# Patient Record
Sex: Male | Born: 1935 | Race: White | Hispanic: No | Marital: Married | Smoking: Never smoker
Health system: Southern US, Community
[De-identification: ages and names within clinical notes are randomized; demographics above are authoritative.]

## PROBLEM LIST (undated history)

## (undated) DIAGNOSIS — E785 Hyperlipidemia, unspecified: Secondary | ICD-10-CM

## (undated) DIAGNOSIS — W19XXXA Unspecified fall, initial encounter: Secondary | ICD-10-CM

## (undated) DIAGNOSIS — K219 Gastro-esophageal reflux disease without esophagitis: Secondary | ICD-10-CM

## (undated) DIAGNOSIS — I10 Essential (primary) hypertension: Secondary | ICD-10-CM

## (undated) DIAGNOSIS — M1990 Unspecified osteoarthritis, unspecified site: Secondary | ICD-10-CM

## (undated) DIAGNOSIS — I340 Nonrheumatic mitral (valve) insufficiency: Secondary | ICD-10-CM

## (undated) DIAGNOSIS — R011 Cardiac murmur, unspecified: Secondary | ICD-10-CM

## (undated) DIAGNOSIS — I6523 Occlusion and stenosis of bilateral carotid arteries: Secondary | ICD-10-CM

## (undated) DIAGNOSIS — I4891 Unspecified atrial fibrillation: Secondary | ICD-10-CM

## (undated) DIAGNOSIS — N400 Enlarged prostate without lower urinary tract symptoms: Secondary | ICD-10-CM

## (undated) DIAGNOSIS — I499 Cardiac arrhythmia, unspecified: Secondary | ICD-10-CM

## (undated) DIAGNOSIS — I493 Ventricular premature depolarization: Secondary | ICD-10-CM

## (undated) HISTORY — PX: ROTATOR CUFF REPAIR: SHX139

## (undated) HISTORY — DX: Unspecified osteoarthritis, unspecified site: M19.90

## (undated) HISTORY — PX: EYE SURGERY: SHX253

## (undated) HISTORY — PX: CARDIOVERSION: SHX1299

## (undated) HISTORY — DX: Gastro-esophageal reflux disease without esophagitis: K21.9

## (undated) HISTORY — PX: LATERAL FUSION LUMBAR SPINE: SUR631

## (undated) HISTORY — PX: SHOULDER OPEN ROTATOR CUFF REPAIR: SHX2407

## (undated) HISTORY — PX: HERNIA REPAIR: SHX51

## (undated) HISTORY — PX: TONSILLECTOMY: SUR1361

## (undated) HISTORY — PX: KYPHOPLASTY: SHX5884

## (undated) HISTORY — PX: SHOULDER DEBRIDEMENT: SHX1052

## (undated) HISTORY — PX: OTHER SURGICAL HISTORY: SHX169

## (undated) HISTORY — PX: CRYOABLATION: SHX1415

## (undated) HISTORY — PX: TOTAL HIP REVISION: SHX763

## (undated) HISTORY — PX: INGUINAL HERNIA REPAIR: SUR1180

## (undated) HISTORY — PX: TOTAL HIP ARTHROPLASTY: SHX124

## (undated) HISTORY — PX: KIDNEY STONE SURGERY: SHX686

---

## 1986-06-17 HISTORY — PX: SHOULDER SURGERY: SHX246

## 1998-07-20 ENCOUNTER — Ambulatory Visit (HOSPITAL_BASED_OUTPATIENT_CLINIC_OR_DEPARTMENT_OTHER): Admission: RE | Admit: 1998-07-20 | Discharge: 1998-07-20 | Payer: Self-pay | Admitting: Surgery

## 1999-03-31 ENCOUNTER — Ambulatory Visit (HOSPITAL_BASED_OUTPATIENT_CLINIC_OR_DEPARTMENT_OTHER): Admission: RE | Admit: 1999-03-31 | Discharge: 1999-03-31 | Payer: Self-pay | Admitting: Surgery

## 2001-06-17 HISTORY — PX: TOTAL HIP ARTHROPLASTY: SHX124

## 2005-06-17 HISTORY — PX: BACK SURGERY: SHX140

## 2007-07-20 ENCOUNTER — Encounter: Admission: RE | Admit: 2007-07-20 | Discharge: 2007-07-20 | Payer: Self-pay | Admitting: Neurological Surgery

## 2008-06-17 HISTORY — PX: TOTAL HIP ARTHROPLASTY: SHX124

## 2010-10-09 ENCOUNTER — Other Ambulatory Visit: Payer: Self-pay | Admitting: Orthopaedic Surgery

## 2010-10-09 DIAGNOSIS — M549 Dorsalgia, unspecified: Secondary | ICD-10-CM

## 2010-10-16 ENCOUNTER — Ambulatory Visit
Admission: RE | Admit: 2010-10-16 | Discharge: 2010-10-16 | Disposition: A | Discharge status: Home / Self Care | Payer: Medicare Other | Source: Ambulatory Visit | Attending: Orthopaedic Surgery | Admitting: Orthopaedic Surgery

## 2010-10-16 DIAGNOSIS — M549 Dorsalgia, unspecified: Secondary | ICD-10-CM

## 2011-07-26 HISTORY — PX: DACROCYSTORHINOSTOMY: SHX1435

## 2012-04-23 ENCOUNTER — Other Ambulatory Visit: Payer: Self-pay | Admitting: Orthopaedic Surgery

## 2012-04-23 DIAGNOSIS — M5136 Other intervertebral disc degeneration, lumbar region: Secondary | ICD-10-CM

## 2012-04-30 ENCOUNTER — Ambulatory Visit
Admission: RE | Admit: 2012-04-30 | Discharge: 2012-04-30 | Disposition: A | Discharge status: Home / Self Care | Payer: Medicare Other | Source: Ambulatory Visit | Attending: Orthopaedic Surgery | Admitting: Orthopaedic Surgery

## 2012-04-30 VITALS — BP 154/67 | HR 54 | Ht 69.0 in | Wt 148.0 lb

## 2012-04-30 DIAGNOSIS — M5136 Other intervertebral disc degeneration, lumbar region: Secondary | ICD-10-CM

## 2012-04-30 MED ORDER — DIAZEPAM 5 MG PO TABS: 5.0000 mg | ORAL_TABLET | Freq: Once | ORAL | Status: DC

## 2012-04-30 MED ORDER — IOHEXOL 180 MG/ML  SOLN
15.0000 mL | Freq: Once | INTRAMUSCULAR | Status: AC | PRN
  Administered 2012-04-30: 15 mL via INTRATHECAL

## 2012-12-02 ENCOUNTER — Ambulatory Visit (INDEPENDENT_AMBULATORY_CARE_PROVIDER_SITE_OTHER): Payer: Medicare PPO | Admitting: Surgery

## 2012-12-02 ENCOUNTER — Encounter (INDEPENDENT_AMBULATORY_CARE_PROVIDER_SITE_OTHER): Payer: Self-pay | Admitting: Surgery

## 2012-12-02 VITALS — BP 138/64 | HR 56 | Temp 98.2°F | Resp 16 | Ht 69.0 in | Wt 150.2 lb

## 2012-12-02 DIAGNOSIS — R1031 Right lower quadrant pain: Secondary | ICD-10-CM | POA: Insufficient documentation

## 2012-12-02 MED ORDER — NAPROXEN 500 MG PO TABS: 500.0000 mg | ORAL_TABLET | Freq: Two times a day (BID) | ORAL | Status: AC

## 2012-12-02 NOTE — Patient Instructions (Signed)
Nonsteroidal Anti-Inflammatory Medications  Nonsteroidal anti-inflammatory medications (NSAIDs) are a group of medicines often used for relief of pain and inflammation. These drugs include ibuprofen, aspirin, and naproxen. They are widely available in an over-the-counter form. The mechanism by which these drugs work in the body is not clearly understood. NSAIDs have many effects on the body, including pain relief, anti-inflammation, fever reduction, and reducing the blood's ability to clot. Most NSAIDs are taken orally in tablet form. Some may also be taken by injection in a vein (intravenously). WHY ATHLETES USE IT Many athletes use NSAIDs for their anti-inflammatory and pain reducing (analgesic) properties. Athletic participation frequently causes aches, pains, and inflammation, which these drugs can treat. There is also some evidence that they speed recovery after injury.  ADVERSE EFFECTS   Nausea.  Stomach pain.  Bleeding from the stomach and intestines.  Inflammation of the kidneys (nephritis).  Inflammation of the liver (hepatitis).  Headache.  Ringing in the ears (tinnitus).  Rash, with sun exposure (photosensitivity).  Increase in fluid volume (fluid retention).  Ulcers of the stomach and small intestine.  Kidney failure.  Liver failure.  Poor control of asthma.  Itching (urticaria).  Increase in nasal polyps (swelling).  Depression.  Loss of red blood cells (anemia).  Loose stools (diarrhea). PHARMACOLOGY  NSAIDs exist in both short-acting and long-acting forms. Many users of NSAIDs experience pain relief with initial doses, that becomes less effective with continual use. Most caregivers believe NSAIDs should be used for 2 to 3 weeks, before they are considered ineffective. Most NSAIDs are excreted from the body through the kidneys. The use of NSAIDs under conditions where dehydration can occur increases the risk of side effects to the kidneys and the liver. This is  especially common in older athletes and in athletes not acclimated to the heat. All these drugs are well absorbed when taken by mouth. The price of these drugs is variable. PREVENTION  It is recommend that NSAIDs be used after athletic participation to help recovery, or in the early stages of injury treatment. This is the time when athletes are trying to control pain and inflammation. Many athletes choose to take NSAIDs prophylactically (preventative, before injury). However, this may be associated with an increased risk of side effects. If you experience any side effects, including a decrease in performance while taking NSAIDs, discontinue use and consult your caregiver. If you choose to use NSAIDs regularly. for longer than 3 to 6 months, you should obtain screening blood tests for the liver, kidney, and bone marrow. Ongoing use may also increase your risk of a stomach ulcer. Document Released: 06/03/2005 Document Revised: 08/26/2011 Document Reviewed: 09/15/2008 Exit[employer] Patient Information 2014 [employer].  

## 2012-12-02 NOTE — Progress Notes (Signed)
[facility]  Chief Complaint  Patient presents with  . New Evaluation    eval poss incisional hernia - patient is self-referred    HISTORY: Patient is a 78 year old male for whom I performed bilateral inguinal herniorrhaphy in February of 2000. Approximately 3 weeks ago he was doing some strenuous yard work and experienced onset of discomfort in the right lower portion of the abdominal wall. Patient was concerned about possible hernia recurrence. He scheduled this evaluation.  Patient has not seen a definite bulge. Area on the lower right abdominal wall causes him some discomfort with physical activity and walking. He has noted no change in his bowel habits.  Past Medical History  Diagnosis Date  . Arthritis   . GERD (gastroesophageal reflux disease)      Current Outpatient Prescriptions  Medication Sig Dispense Refill  . CADUET 5-20 MG per tablet       . CIALIS 5 MG tablet       . Eszopiclone 3 MG TABS       . losartan (COZAAR) 50 MG tablet       . LUMIGAN 0.01 % SOLN       . NEXIUM 40 MG capsule       . timolol (TIMOPTIC) 0.5 % ophthalmic solution       . naproxen (NAPROSYN) 500 MG tablet Take 1 tablet (500 mg total) by mouth 2 (two) times daily with a meal.  28 tablet  1   No current facility-administered medications for this visit.     No Known Allergies   History reviewed. No pertinent family history.   History   Social History  . Marital Status: Married    Spouse Name: N/A    Number of Children: N/A  . Years of Education: N/A   Social History Main Topics  . Smoking status: Never Smoker   . Smokeless tobacco: Never Used  . Alcohol Use: Yes  . Drug Use: No  . Sexually Active: None   Other Topics Concern  . None   Social History Narrative  . None     REVIEW OF SYSTEMS - PERTINENT POSITIVES ONLY: No signs or symptoms of obstruction. No visible bulge. Discomfort is more pronounced with physical  activity.  EXAM: Filed Vitals:   12/02/12 1123  BP: 138/64  Pulse: 56  Temp: 98.2 F (36.8 C)  Resp: 16    HEENT: normocephalic; pupils equal and reactive; sclerae clear; dentition good; mucous membranes moist NECK:  symmetric on extension; no palpable anterior or posterior cervical lymphadenopathy; no supraclavicular masses; no tenderness CHEST: clear to auscultation bilaterally without rales, rhonchi, or wheezes CARDIAC: regular rate and rhythm without significant murmur; peripheral pulses are full ABDOMEN: soft without distension; bowel sounds present; no mass; no hepatosplenomegaly; no hernia; Incisions are well-healed; mild tenderness in the right lower mid abdominal wall; patient is examined with cough and Valsalva both in a recumbent and a standing position EXT:  non-tender without edema; no deformity NEURO: no gross focal deficits; no sign of tremor   LABORATORY RESULTS: See [facility] (CHL-Epic) for most recent results   RADIOLOGY RESULTS: See [facility] (CHL-Epic) for most recent results   IMPRESSION: Pain in the right lower quadrant abdominal wall following vigorous physical activity, suspect muscle strain, no evidence of recurrent hernia  PLAN: After lengthy discussion, the patient is going to start on Naprosyn 500 mg twice daily for 2 weeks. I have given him one refill. I have asked him  to return in 3 weeks for repeat physical examination. Patient understands and agrees. If there is no improvement in his symptoms, we will consider CT scan of the abdomen.  Velora Heckler, MD, FACS General & Endocrine Surgery [facility]   Visit Diagnoses: 1. Abdominal wall pain in right lower quadrant     Primary Care Physician: Elijio Miles, MD

## 2012-12-28 ENCOUNTER — Encounter (INDEPENDENT_AMBULATORY_CARE_PROVIDER_SITE_OTHER): Payer: Medicare PPO | Admitting: Surgery

## 2013-06-17 HISTORY — PX: CATARACT EXTRACTION, BILATERAL: SHX1313

## 2017-10-15 HISTORY — PX: OTHER SURGICAL HISTORY: SHX169

## 2017-12-10 HISTORY — PX: OTHER SURGICAL HISTORY: SHX169

## 2017-12-15 ENCOUNTER — Other Ambulatory Visit: Payer: Self-pay

## 2017-12-15 ENCOUNTER — Inpatient Hospital Stay (HOSPITAL_COMMUNITY)
Admission: EM | Admit: 2017-12-15 | Discharge: 2017-12-18 | DRG: 560 | Disposition: A | Discharge status: Home / Self Care | Payer: Medicare Other | Attending: Internal Medicine | Admitting: Internal Medicine

## 2017-12-15 ENCOUNTER — Emergency Department (HOSPITAL_COMMUNITY): Payer: Medicare Other

## 2017-12-15 ENCOUNTER — Encounter (HOSPITAL_COMMUNITY): Payer: Self-pay | Admitting: Internal Medicine

## 2017-12-15 DIAGNOSIS — I10 Essential (primary) hypertension: Secondary | ICD-10-CM | POA: Diagnosis present

## 2017-12-15 DIAGNOSIS — S7011XA Contusion of right thigh, initial encounter: Secondary | ICD-10-CM | POA: Diagnosis present

## 2017-12-15 DIAGNOSIS — I739 Peripheral vascular disease, unspecified: Secondary | ICD-10-CM | POA: Diagnosis present

## 2017-12-15 DIAGNOSIS — E7800 Pure hypercholesterolemia, unspecified: Secondary | ICD-10-CM | POA: Diagnosis present

## 2017-12-15 DIAGNOSIS — Z79899 Other long term (current) drug therapy: Secondary | ICD-10-CM

## 2017-12-15 DIAGNOSIS — R509 Fever, unspecified: Secondary | ICD-10-CM | POA: Diagnosis not present

## 2017-12-15 DIAGNOSIS — R6510 Systemic inflammatory response syndrome (SIRS) of non-infectious origin without acute organ dysfunction: Secondary | ICD-10-CM

## 2017-12-15 DIAGNOSIS — D649 Anemia, unspecified: Secondary | ICD-10-CM | POA: Diagnosis present

## 2017-12-15 DIAGNOSIS — Z7901 Long term (current) use of anticoagulants: Secondary | ICD-10-CM | POA: Diagnosis not present

## 2017-12-15 DIAGNOSIS — M25551 Pain in right hip: Secondary | ICD-10-CM | POA: Diagnosis not present

## 2017-12-15 DIAGNOSIS — I480 Paroxysmal atrial fibrillation: Secondary | ICD-10-CM | POA: Diagnosis present

## 2017-12-15 DIAGNOSIS — T8451XA Infection and inflammatory reaction due to internal right hip prosthesis, initial encounter: Secondary | ICD-10-CM | POA: Diagnosis not present

## 2017-12-15 DIAGNOSIS — K219 Gastro-esophageal reflux disease without esophagitis: Secondary | ICD-10-CM | POA: Diagnosis present

## 2017-12-15 DIAGNOSIS — L03115 Cellulitis of right lower limb: Secondary | ICD-10-CM | POA: Diagnosis present

## 2017-12-15 DIAGNOSIS — Z981 Arthrodesis status: Secondary | ICD-10-CM

## 2017-12-15 DIAGNOSIS — N400 Enlarged prostate without lower urinary tract symptoms: Secondary | ICD-10-CM | POA: Diagnosis present

## 2017-12-15 DIAGNOSIS — W19XXXA Unspecified fall, initial encounter: Secondary | ICD-10-CM | POA: Diagnosis present

## 2017-12-15 DIAGNOSIS — E876 Hypokalemia: Secondary | ICD-10-CM | POA: Diagnosis present

## 2017-12-15 HISTORY — DX: Essential (primary) hypertension: I10

## 2017-12-15 LAB — URINALYSIS, ROUTINE W REFLEX MICROSCOPIC
Bacteria, UA: NONE SEEN
Bilirubin Urine: NEGATIVE
Glucose, UA: NEGATIVE mg/dL
KETONES UR: NEGATIVE mg/dL
LEUKOCYTES UA: NEGATIVE
Nitrite: NEGATIVE
PROTEIN: NEGATIVE mg/dL
Specific Gravity, Urine: 1.009 (ref 1.005–1.030)
pH: 6 (ref 5.0–8.0)

## 2017-12-15 LAB — COMPREHENSIVE METABOLIC PANEL
ALBUMIN: 3.1 g/dL — AB (ref 3.5–5.0)
ALT: 18 U/L (ref 0–44)
ANION GAP: 10 (ref 5–15)
AST: 28 U/L (ref 15–41)
Alkaline Phosphatase: 54 U/L (ref 38–126)
BILIRUBIN TOTAL: 2.1 mg/dL — AB (ref 0.3–1.2)
BUN: 16 mg/dL (ref 8–23)
CHLORIDE: 104 mmol/L (ref 98–111)
CO2: 24 mmol/L (ref 22–32)
Calcium: 8.8 mg/dL — ABNORMAL LOW (ref 8.9–10.3)
Creatinine, Ser: 0.9 mg/dL (ref 0.61–1.24)
GFR calc Af Amer: >60 mL/min (ref >60)
GFR calc non Af Amer: >60 mL/min (ref >60)
Glucose, Bld: 158 mg/dL — ABNORMAL HIGH (ref 70–99)
POTASSIUM: 3.2 mmol/L — AB (ref 3.5–5.1)
Sodium: 138 mmol/L (ref 135–145)
TOTAL PROTEIN: 5.8 g/dL — AB (ref 6.5–8.1)

## 2017-12-15 LAB — CBC WITH DIFFERENTIAL/PLATELET
Basophils Absolute: 0 10*3/uL (ref 0.0–0.1)
Basophils Relative: 0 %
EOS PCT: 0 %
Eosinophils Absolute: 0 10*3/uL (ref 0.0–0.7)
HEMATOCRIT: 38.9 % — AB (ref 39.0–52.0)
Hemoglobin: 13 g/dL (ref 13.0–17.0)
Lymphocytes Relative: 3 %
Lymphs Abs: 0.3 10*3/uL — ABNORMAL LOW (ref 0.7–4.0)
MCH: 32.6 pg (ref 26.0–34.0)
MCHC: 33.4 g/dL (ref 30.0–36.0)
MCV: 97.5 fL (ref 78.0–100.0)
MONO ABS: 1.2 10*3/uL — AB (ref 0.1–1.0)
MONOS PCT: 11 %
NEUTROS PCT: 86 %
Neutro Abs: 9.5 10*3/uL — ABNORMAL HIGH (ref 1.7–7.7)
PLATELETS: 135 10*3/uL — AB (ref 150–400)
RBC: 3.99 MIL/uL — AB (ref 4.22–5.81)
RDW: 13 % (ref 11.5–15.5)
WBC: 11 10*3/uL — AB (ref 4.0–10.5)

## 2017-12-15 LAB — I-STAT CG4 LACTIC ACID, ED
Lactic Acid, Venous: 1.82 mmol/L (ref 0.5–1.9)
Lactic Acid, Venous: 1.43 mmol/L (ref 0.5–1.9)

## 2017-12-15 MED ORDER — TAMSULOSIN HCL 0.4 MG PO CAPS
0.4000 mg | ORAL_CAPSULE | Freq: Every day | ORAL | Status: DC
  Administered 2017-12-17: 0.4 mg via ORAL
  Filled 2017-12-15 (×2): qty 1

## 2017-12-15 MED ORDER — ACETAMINOPHEN 650 MG RE SUPP: 650.0000 mg | Freq: Four times a day (QID) | RECTAL | Status: DC | PRN

## 2017-12-15 MED ORDER — ZOLPIDEM TARTRATE 5 MG PO TABS
5.0000 mg | ORAL_TABLET | Freq: Once | ORAL | Status: AC
  Administered 2017-12-15: 5 mg via ORAL
  Filled 2017-12-15: qty 1

## 2017-12-15 MED ORDER — POLYSACCHARIDE IRON COMPLEX 150 MG PO CAPS
150.0000 mg | ORAL_CAPSULE | Freq: Every day | ORAL | Status: DC
  Administered 2017-12-16 – 2017-12-17 (×2): 150 mg via ORAL
  Filled 2017-12-15 (×3): qty 1

## 2017-12-15 MED ORDER — PANTOPRAZOLE SODIUM 40 MG PO TBEC
40.0000 mg | DELAYED_RELEASE_TABLET | Freq: Every day | ORAL | Status: DC
  Administered 2017-12-16 – 2017-12-17 (×2): 40 mg via ORAL
  Filled 2017-12-15 (×3): qty 1

## 2017-12-15 MED ORDER — FLECAINIDE ACETATE 50 MG PO TABS
50.0000 mg | ORAL_TABLET | Freq: Two times a day (BID) | ORAL | Status: DC
  Administered 2017-12-16 – 2017-12-18 (×5): 50 mg via ORAL
  Filled 2017-12-15 (×6): qty 1

## 2017-12-15 MED ORDER — POTASSIUM CHLORIDE CRYS ER 20 MEQ PO TBCR
40.0000 meq | EXTENDED_RELEASE_TABLET | Freq: Once | ORAL | Status: AC
  Administered 2017-12-15: 40 meq via ORAL
  Filled 2017-12-15: qty 2

## 2017-12-15 MED ORDER — SODIUM CHLORIDE 0.9 % IV BOLUS (SEPSIS)
250.0000 mL | Freq: Once | INTRAVENOUS | Status: AC
  Administered 2017-12-15: 250 mL via INTRAVENOUS

## 2017-12-15 MED ORDER — PIPERACILLIN-TAZOBACTAM 3.375 G IVPB
3.3750 g | Freq: Three times a day (TID) | INTRAVENOUS | Status: DC
  Administered 2017-12-15 – 2017-12-16 (×2): 3.375 g via INTRAVENOUS
  Filled 2017-12-15 (×4): qty 50

## 2017-12-15 MED ORDER — DORZOLAMIDE HCL-TIMOLOL MAL PF 22.3-6.8 MG/ML OP SOLN: 1.0000 [drp] | OPHTHALMIC | Status: DC

## 2017-12-15 MED ORDER — ATORVASTATIN CALCIUM 40 MG PO TABS
40.0000 mg | ORAL_TABLET | Freq: Every day | ORAL | Status: DC
Start: 2017-12-15 — End: 2017-12-18
  Administered 2017-12-16 – 2017-12-17 (×2): 40 mg via ORAL
  Filled 2017-12-15 (×3): qty 1

## 2017-12-15 MED ORDER — SODIUM CHLORIDE 0.9 % IV SOLN
1.0000 g | Freq: Once | INTRAVENOUS | Status: DC
  Filled 2017-12-15: qty 10

## 2017-12-15 MED ORDER — DORZOLAMIDE HCL-TIMOLOL MAL 2-0.5 % OP SOLN
1.0000 [drp] | Freq: Every morning | OPHTHALMIC | Status: DC
  Administered 2017-12-16 – 2017-12-18 (×3): 1 [drp] via OPHTHALMIC
  Filled 2017-12-15: qty 10

## 2017-12-15 MED ORDER — SODIUM CHLORIDE 0.9 % IV SOLN
INTRAVENOUS | Status: AC
  Administered 2017-12-15: 21:00:00 via INTRAVENOUS

## 2017-12-15 MED ORDER — OCUVITE-LUTEIN PO CAPS
1.0000 | ORAL_CAPSULE | Freq: Every day | ORAL | Status: DC
  Filled 2017-12-15: qty 1

## 2017-12-15 MED ORDER — SODIUM CHLORIDE 0.9 % IV BOLUS (SEPSIS)
1000.0000 mL | Freq: Once | INTRAVENOUS | Status: AC
  Administered 2017-12-15: 1000 mL via INTRAVENOUS

## 2017-12-15 MED ORDER — ONDANSETRON HCL 4 MG PO TABS: 4.0000 mg | ORAL_TABLET | Freq: Four times a day (QID) | ORAL | Status: DC | PRN

## 2017-12-15 MED ORDER — DOCUSATE SODIUM 100 MG PO CAPS
100.0000 mg | ORAL_CAPSULE | Freq: Two times a day (BID) | ORAL | Status: DC
  Administered 2017-12-16 – 2017-12-18 (×3): 100 mg via ORAL
  Filled 2017-12-15 (×4): qty 1

## 2017-12-15 MED ORDER — VANCOMYCIN HCL 500 MG IV SOLR
500.0000 mg | Freq: Two times a day (BID) | INTRAVENOUS | Status: DC
  Administered 2017-12-16 – 2017-12-18 (×5): 500 mg via INTRAVENOUS
  Filled 2017-12-15 (×6): qty 500

## 2017-12-15 MED ORDER — ACETAMINOPHEN 325 MG PO TABS
650.0000 mg | ORAL_TABLET | Freq: Four times a day (QID) | ORAL | Status: DC | PRN
  Administered 2017-12-16 (×2): 650 mg via ORAL
  Filled 2017-12-15 (×3): qty 2

## 2017-12-15 MED ORDER — VANCOMYCIN HCL IN DEXTROSE 1-5 GM/200ML-% IV SOLN
1000.0000 mg | Freq: Once | INTRAVENOUS | Status: AC
  Administered 2017-12-15: 1000 mg via INTRAVENOUS
  Filled 2017-12-15: qty 200

## 2017-12-15 MED ORDER — FINASTERIDE 5 MG PO TABS
5.0000 mg | ORAL_TABLET | Freq: Every day | ORAL | Status: DC
  Administered 2017-12-17: 5 mg via ORAL
  Filled 2017-12-15 (×3): qty 1

## 2017-12-15 MED ORDER — PROSIGHT PO TABS
1.0000 | ORAL_TABLET | Freq: Every day | ORAL | Status: DC
  Administered 2017-12-16 – 2017-12-18 (×3): 1 via ORAL
  Filled 2017-12-15 (×3): qty 1

## 2017-12-15 MED ORDER — ONDANSETRON HCL 4 MG/2ML IJ SOLN: 4.0000 mg | Freq: Four times a day (QID) | INTRAMUSCULAR | Status: DC | PRN

## 2017-12-15 NOTE — ED Notes (Signed)
Patient transported to X-ray 

## 2017-12-15 NOTE — ED Triage Notes (Addendum)
Pt here from home after falling due to right leg pain.  Per EMS, pt denies cervical tenderness and patient has metal fragments in his right leg due to his hip replacement "spitting off" pieces of metal into his tissue. Pt on eloquis BID. Area on right leg appears to be warm and red. Temp 102.7 tympanic for EMS. Given 1000mg  tylenol by EMS.

## 2017-12-15 NOTE — Progress Notes (Signed)
Pharmacy Antibiotic Note  Travis Wolfe is a 83 y.o. male admitted on 12/15/2017 with cellulitis.  Pharmacy has been consulted for vancomycin and Zosyn dosing.  SCr stable, CrCl ~760ml/min  Plan: Start Zosyn 3.375 gm IV q8h (4 hour infusion) Give vancomycin 1g IV x 1, then start vancomycin 500mg  IV Q12h Monitor clinical picture, renal function, VT prn F/U C&S, abx deescalation / LOT  Height: 5\' 8"  (172.7 cm) Weight: 148 lb (67.1 kg) IBW/kg (Calculated) : 68.4  Temp (24hrs), Avg:99.6 F (37.6 C), Min:99.6 F (37.6 C), Max:99.6 F (37.6 C)  Recent Labs  Lab 12/15/17 1519 12/15/17 1540 12/15/17 1813  WBC 11.0*  --   --   CREATININE 0.90  --   --   LATICACIDVEN  --  1.82 1.43    Estimated Creatinine Clearance: 62.1 mL/min (by C-G formula based on SCr of 0.9 mg/dL).    No Known Allergies   Thank you for allowing pharmacy to be a part of this patient's care.  Armandina StammerBATCHELDER,Travis Wolfe 12/15/2017 6:21 PM

## 2017-12-15 NOTE — H&P (Signed)
[facility] History and Physical  CARDIN NITSCHKE WUJ:[id number] DOB: 1934/12/10 DOA: 12/15/2017   PCP: Elijio Miles., MD  Specialists: Patient is followed by cardiologist in [facility] Dr. Rudolpho Sevin.  Followed by orthopedics at [facility] as well as in [place].  Chief Complaint: Pain and swelling of the right thigh  HPI: CUTBERTO WINFREE is a 83 y.o. 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  Apparently he developed shrapnel bits which causes fluid accumulation in his right thigh.  Most recently he had aspiration of this fluid collection at [facility] done by interventional radiology.  He denies being on antibiotics recently.  Over the last 2 days he has developed pain and swelling in the right thigh area with fever at home.  The area has been warm to touch and somewhat reddish in color.  He feels that the right leg gives away easily due to the pain and swelling.  He reports 2 falls as a result, but denies any injury to the head or to his right leg.  Apparently he stopped taking his Eliquis 2 days ago.  Had nausea few days ago but no vomiting.  Denies any chest pain.  No shortness of breath.  The pain in the right leg is about 6 out of 10 in intensity.  In the emergency department patient was noted to be febrile.  Actually found to have a fever of 102 by the EMS.  He had a elevated WBC.  Lactic acid level was normal.  Renal function was normal.  Chest x-ray did not show any concerning findings.  He was noted to have soft blood pressures with normal heart rate and has been asymptomatic.  EDP discussed with orthopedics (Dr. Charlann Boxer) who recommended MRI and they will see the patient in consult.  Concern is for cellulitis  involving his right thigh area.  Home Medications: This medication this appears to be incomplete based on care everywhere reports and patient reports.  Requested pharmacy to review it again. Prior to Admission medications   Medication Sig Start Date End Date Taking? Authorizing Provider  atorvastatin (LIPITOR) 40 MG tablet Take 40 mg by mouth daily. 12/10/17  Yes [provider]  COSOPT PF 22.3-6.8 MG/ML SOLN ophthalmic solution Place 1 drop into both eyes every morning. 10/22/17  Yes [provider]  ELIQUIS 5 MG TABS tablet Take 5 mg by mouth 2 (two) times daily. 10/27/17  Yes [provider]  finasteride (PROSCAR) 5 MG tablet Take 5 mg by mouth daily.   Yes [provider]  iron polysaccharides (NIFEREX) 150 MG capsule Take 150 mg by mouth daily.   Yes [provider]  LUMIGAN 0.01 % SOLN  10/22/12  Yes [provider]  LYCOPENE PO Take 1 tablet by mouth daily.   Yes [provider]  MAGNESIUM CARBONATE PO Take 1 tablet by mouth daily.   Yes [provider]  multivitamin-lutein (OCUVITE-LUTEIN) CAPS capsule Take 1 capsule by mouth daily.   Yes [provider]  NEXIUM 40 MG capsule Take 40 mg by mouth daily.  11/19/12  Yes [provider]  Zinc Acetate, Oral, (ZINC ACETATE PO) Take 1 tablet by mouth daily.   Yes [provider]    Allergies: No Known Allergies  Past Medical History: Past Medical History:  Diagnosis Date  . Arthritis   . GERD (gastroesophageal reflux disease)   . Hypertension     Past Surgical History:  Procedure Laterality Date  . HERNIA REPAIR Bilateral   . KYPHOPLASTY     t7  . LATERAL FUSION LUMBAR SPINE    . SHOULDER DEBRIDEMENT Right   . SHOULDER OPEN ROTATOR CUFF REPAIR Left   . TOTAL HIP ARTHROPLASTY     done twice    Social History: no h/o smoking.  Occasional alcohol use.  No illicit drug use.  Usually independent with daily activities.  He lives with his  wife.   Family History:  Family History  Problem Relation Age of Onset  . Hypertension Mother   . Cancer Mother      Review of Systems - History obtained from the patient and wife General ROS: positive for  - fatigue Psychological ROS: negative Ophthalmic ROS: negative ENT ROS: negative Allergy and Immunology ROS: negative Hematological and Lymphatic ROS: negative Endocrine ROS: negative Respiratory ROS: no cough, shortness of breath, or wheezing Cardiovascular ROS: no chest pain or dyspnea on exertion Gastrointestinal ROS: no abdominal pain, change in bowel habits, or black or bloody stools Genito-Urinary ROS: no dysuria, trouble voiding, or hematuria Musculoskeletal ROS: as in hpi Neurological ROS: no TIA or stroke symptoms Dermatological ROS: as in hpi  Physical Examination  Vitals:   12/15/17 1454 12/15/17 1630 12/15/17 1645 12/15/17 1700  BP: 102/65 (!) 102/49 (!) 110/51 (!) 107/48  Pulse: 88 71 68 68  Resp: 18 12 15 15   Temp: 99.6 F (37.6 C)     TempSrc: Oral     SpO2: 96% 99% 99% 99%  Weight: 67.1 kg (148 lb)     Height: 5\' 8"  (1.727 m)       BP (!) 107/48   Pulse 68   Temp 99.6 F (37.6 C) (Oral)   Resp 15   Ht 5\' 8"  (1.727 m)   Wt 67.1 kg (148 lb)   SpO2 99%   BMI 22.50 kg/m   General appearance: alert, cooperative, appears stated age and no distress Head: Normocephalic, without obvious abnormality, atraumatic Eyes: conjunctivae/corneas clear. PERRL, EOM's intact.  Throat: lips, mucosa, and tongue normal; teeth and gums normal Neck: no adenopathy, no carotid bruit, no JVD, supple, symmetrical, trachea midline and thyroid not enlarged, symmetric, no tenderness/mass/nodules Resp: clear to auscultation bilaterally Cardio: regular rate and rhythm, S1, S2 normal, no murmur, click, rub or gallop GI: soft, non-tender; bowel sounds normal; no masses,  no organomegaly Extremities: Swollen compared to the left.  Mostly in the lateral area.  There is mild  tenderness.  There is erythema in the lateral aspect.  Warm to touch. Pulses: 2+ and symmetric Good dorsalis pedis pulse in the right lower extremity Skin: Erythema and warmth over the right lateral thigh Lymph nodes: Cervical, supraclavicular, and axillary nodes normal. Neurologic: Alert and oriented x3.  No focal neurological deficits.   Labs on Admission: I have personally reviewed following labs and imaging studies  CBC: Recent Labs  Lab 12/15/17 1519  WBC 11.0*  NEUTROABS 9.5*  HGB 13.0  HCT 38.9*  MCV 97.5  PLT 135*   Basic Metabolic Panel: Recent Labs  Lab 12/15/17 1519  NA 138  K 3.2*  CL 104  CO2 24  GLUCOSE 158*  BUN 16  CREATININE 0.90  CALCIUM 8.8*   GFR: Estimated Creatinine Clearance: 62.1 mL/min (by C-G formula based on SCr of  0.9 mg/dL). Liver Function Tests: Recent Labs  Lab 12/15/17 1519  AST 28  ALT 18  ALKPHOS 54  BILITOT 2.1*  PROT 5.8*  ALBUMIN 3.1*    Radiological Exams on Admission: Dg Chest 2 View  Result Date: 12/15/2017 CLINICAL DATA:  Fever. EXAM: CHEST - 2 VIEW COMPARISON:  Chest radiographs and chest CTA 10/14/2017 FINDINGS: The cardiac silhouette is borderline enlarged. No airspace consolidation, edema, pleural effusion, or pneumothorax is identified. Old T7 and T8 compression fractures are again noted with prior augmentation of the former. IMPRESSION: No active cardiopulmonary disease. Electronically Signed   By: Sebastian Ache M.D.   On: 12/15/2017 16:24    My interpretation of Electrocardiogram: Sinus rhythm in the 80s.  Normal axis.  Incomplete LBBB.  Subtle ST changes in the lateral leads V5 V6.  No old EKG available for comparison.   Problem List  Principal Problem:   Cellulitis of right leg Active Problems:   PAF (paroxysmal atrial fibrillation) (HCC)   Assessment: Is a 83 year old Caucasian male with a past medical history as stated earlier who presents with pain and swelling in the right thigh area.  He has also had  2 falls in the last few days.  Concern is for cellulitis in the right thigh area.  He has had a complicated orthopedic history and has had good collection in that thigh which has been drained as recently as June 26.  This was done by interventional radiology in [facility].  Plan: #1 cellulitis of the right thigh area with sepsis: Patient will need hospitalization for IV antibiotics.  He will be given broad-spectrum coverage with vancomycin and Zosyn especially due to his complicated history with recent drainage procedure.  They aspirated about 600 mL of debris-filled bloody fluid on 6/26.  This was done by Dr. Archer Asa at [facility].  Patient does have some swelling in that leg.  Dr. Charlann Boxer with orthopedics has been consulted by EDP and he will see the patient.  MRI was recommended and has been ordered by the ED provider.  Patient's lactic acid level is normal.  Follow-up on blood cultures.  #2  Falls: Most likely due to acute illness.  Patient does not have any focal neurological deficits.  PT and OT evaluation can be ordered tomorrow depending on the results of the MRI and orthopedic plan.  #3  Paroxysmal atrial fibrillation: Followed by cardiology at [facility]  Based on the care everywhere notes he is on diltiazem, flecainide and apixaban.  Hold his anticoagulation for now.  Hold the diltiazem since his blood pressure is soft.  Continue just with flecainide for now.  #4  History of BPH: Continue with this Flomax and finasteride.  #5 hypokalemia: Repleted.  DVT Prophylaxis: Patient has been on anticoagulation with apixaban which will be held for now. Code Status: Full code Family Communication: Discussed with patient and his wife Consults called: Dr. Charlann Boxer with orthopedics has been consulted by EDP  Severity of Illness: The appropriate patient status for this patient is INPATIENT. Inpatient status is judged to be reasonable and necessary in order to provide the required intensity of service to  ensure the patient's safety. The patient's presenting symptoms, physical exam findings, and initial radiographic and laboratory data in the context of their chronic comorbidities is felt to place them at high risk for further clinical deterioration. Furthermore, it is not anticipated that the patient will be medically stable for discharge from the hospital within 2 midnights of admission. The following factors  support the patient status of inpatient.   " The patient's presenting symptoms include right thigh pain.. " The worrisome physical exam findings include erythema involving the right thigh. " The initial radiographic and laboratory data are worrisome because of elevated WBC.  MRI is pending.. " The chronic co-morbidities include paroxysmal atrial fibrillation.   * I certify that at the point of admission it is my clinical judgment that the patient will require inpatient hospital care spanning beyond 2 midnights from the point of admission due to high intensity of service, high risk for further deterioration and high frequency of surveillance required.*   Further management decisions will depend on results of further testing and patient's response to treatment.   Osvaldo Shipper  [facility] Pager [phone]  If 7PM-7AM, please contact night-coverage [email or web address] Password [facility]  12/15/2017, 6:03 PM

## 2017-12-15 NOTE — ED Provider Notes (Signed)
[facility] EMERGENCY DEPARTMENT Provider Note   CSN: [id number] Arrival date & time: 12/15/17  1449     History   Chief Complaint Chief Complaint  Patient presents with  . Leg Pain  . Fall    HPI Travis Wolfe is a 83 y.o. male.  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  Reports he has had shrapnel bits that came off of the placement.  He had 2 revisions, last being approximately 1 year ago.  He has continued to have ongoing problems with it.  He gets large swellings and fluid collections.  He had one that involve the anterior leg and wraparound to his buttock and posterior thigh.  Most recently he had a ultrasound-guided aspiration by radiology through [facility].  Note dated 12/15/2017 Dr. Malachy Moan.  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  Large complex fluid collection along posterior aspect of thigh tracking along hamstring musculature.  Note indicates transition from 14-gauge Angiocath to 8.5 [place] catheter to 12 French catheter to drain a thick, complex fluid collection of sand like red debris.  Total of 600 mL removed.  Patient reports that he has had chronic problems with pain and weakness in that leg due to the complications of the surgery.  He did fall today because the leg gave out.  He denies that he struck his head.(He did strike the right side of his face yesterday when he was getting off a barstool.  There was no fall associated.  Reports he bruised very easily due to the Eliquis).   Patient reports he stopped taking Eliquis for about the past 2 days.  He reports that he has been on Eliquis for atrial fibrillation but he has not had any episodes of atrial fibrillation in a long time and wonders whether he really needs it.  Patient was not aware that he had fever to 102.  Did start developing chills yesterday and has had some  malaise.  He does not localize other positive symptoms on review of systems.  No significant cough or shortness of breath or chest pain.  No nausea vomiting or abdominal pain.  No urinary pain burning or difficulty. Past Medical History:  Diagnosis Date  . Arthritis   . GERD (gastroesophageal reflux disease)   . Hypertension     Patient Active Problem List   Diagnosis Date Noted  . Cellulitis of right leg 12/15/2017  . PAF (paroxysmal atrial fibrillation) (HCC) 12/15/2017  . Abdominal wall pain in right lower quadrant 12/02/2012    Past Surgical History:  Procedure Laterality Date  . HERNIA REPAIR Bilateral   . KYPHOPLASTY     t7  . LATERAL FUSION LUMBAR SPINE    . SHOULDER DEBRIDEMENT Right   . SHOULDER OPEN ROTATOR CUFF REPAIR Left   . TOTAL HIP ARTHROPLASTY     done twice        Home Medications    Prior to Admission medications   Medication Sig Start Date End Date Taking? Authorizing Provider  atorvastatin (LIPITOR) 40 MG tablet Take 40 mg by mouth daily. 12/10/17  Yes [provider]  COSOPT PF 22.3-6.8 MG/ML SOLN ophthalmic solution Place 1 drop into both eyes every morning. 10/22/17  Yes [provider]  ELIQUIS 5 MG TABS tablet Take 5 mg by mouth 2 (two) times daily. 10/27/17  Yes [provider]  finasteride (PROSCAR) 5 MG tablet  Take 5 mg by mouth daily.   Yes [provider]  iron polysaccharides (NIFEREX) 150 MG capsule Take 150 mg by mouth daily.   Yes [provider]  LUMIGAN 0.01 % SOLN  10/22/12  Yes [provider]  LYCOPENE PO Take 1 tablet by mouth daily.   Yes [provider]  MAGNESIUM CARBONATE PO Take 1 tablet by mouth daily.   Yes [provider]  multivitamin-lutein (OCUVITE-LUTEIN) CAPS capsule Take 1 capsule by mouth daily.   Yes [provider]  NEXIUM 40 MG capsule Take 40 mg by mouth daily.  11/19/12  Yes [provider]  Zinc Acetate, Oral, (ZINC ACETATE PO)  Take 1 tablet by mouth daily.   Yes [provider]    Family History Family History  Problem Relation Age of Onset  . Hypertension Mother   . Cancer Mother     Social History Social History   Tobacco Use  . Smoking status: Never Smoker  . Smokeless tobacco: Never Used  Substance Use Topics  . Alcohol use: Yes  . Drug use: No     Allergies   Patient has no known allergies.   Review of Systems Review of Systems 10 Systems reviewed and are negative for acute change except as noted in the HPI.   Physical Exam Updated Vital Signs BP (!) 107/48   Pulse 68   Temp 99.6 F (37.6 C) (Oral)   Resp 15   Ht 5\' 8"  (1.727 m)   Wt 67.1 kg (148 lb)   SpO2 99%   BMI 22.50 kg/m   Physical Exam  Constitutional:  Patient is alert and nontoxic.  He is pleasant interactive.  No respiratory distress at rest.  HENT:  Patient has bruising on the right side of the face see attached images.  The oropharynx widely patent.  Eyes: Pupils are equal, round, and reactive to light. EOM are normal.  Neck: Neck supple.  Cardiovascular: Normal rate, regular rhythm, normal heart sounds and intact distal pulses.  Pulmonary/Chest: Effort normal and breath sounds normal.  Abdominal: Soft. He exhibits no distension. There is no tenderness. There is no guarding.  Musculoskeletal:  Patient has some letter range of motion of the right lower extremity due to his chronic hip pain.  Significant bruising on the left forearm.  Normal range of motion in both upper extremities.                     ED Treatments / Results  Labs (all labs ordered are listed, but only abnormal results are displayed) Labs Reviewed  COMPREHENSIVE METABOLIC PANEL - Abnormal; Notable for the following components:      Result Value   Potassium 3.2 (*)    Glucose, Bld 158 (*)    Calcium 8.8 (*)    Total Protein 5.8 (*)    Albumin 3.1 (*)    Total Bilirubin 2.1 (*)    All other components within  normal limits  CBC WITH DIFFERENTIAL/PLATELET - Abnormal; Notable for the following components:   WBC 11.0 (*)    RBC 3.99 (*)    HCT 38.9 (*)    Platelets 135 (*)    Neutro Abs 9.5 (*)    Lymphs Abs 0.3 (*)    Monocytes Absolute 1.2 (*)    All other components within normal limits  CULTURE, BLOOD (ROUTINE X 2)  CULTURE, BLOOD (ROUTINE X 2)  URINALYSIS, ROUTINE W REFLEX MICROSCOPIC  I-STAT CG4 LACTIC ACID, ED  I-STAT CG4 LACTIC ACID, ED    EKG EKG Interpretation  Date/Time:  Monday December 15 2017 15:52:18 EDT Ventricular Rate:  82 PR Interval:    QRS Duration: 110 QT Interval:  372 QTC Calculation: 435 R Axis:   -12 Text Interpretation:  Sinus rhythm Incomplete left bundle branch block agree. no old comparison Confirmed by Arby BarrettePfeiffer, Stevi Hollinshead [phone]([id number]) on 12/15/2017 6:20:45 PM   Radiology Dg Chest 2 View  Result Date: 12/15/2017 CLINICAL DATA:  Fever. EXAM: CHEST - 2 VIEW COMPARISON:  Chest radiographs and chest CTA 10/14/2017 FINDINGS: The cardiac silhouette is borderline enlarged. No airspace consolidation, edema, pleural effusion, or pneumothorax is identified. Old T7 and T8 compression fractures are again noted with prior augmentation of the former. IMPRESSION: No active cardiopulmonary disease. Electronically Signed   By: Sebastian AcheAllen  Grady M.D.   On: 12/15/2017 16:24    Procedures Procedures (including critical care time) CRITICAL CARE Performed by: Arby BarretteMarcy Robt Okuda   Total critical care time:30 minutes  Critical care time was exclusive of separately billable procedures and treating other patients.  Critical care was necessary to treat or prevent imminent or life-threatening deterioration.  Critical care was time spent personally by me on the following activities: development of treatment plan with patient and/or surrogate as well as nursing, discussions with consultants, evaluation of patient's response to treatment, examination of patient, obtaining history from patient or  surrogate, ordering and performing treatments and interventions, ordering and review of laboratory studies, ordering and review of radiographic studies, pulse oximetry and re-evaluation of patient's condition. Medications Ordered in ED Medications  sodium chloride 0.9 % bolus 1,000 mL (0 mLs Intravenous Stopped 12/15/17 1643)    And  sodium chloride 0.9 % bolus 1,000 mL (0 mLs Intravenous Stopped 12/15/17 1643)    And  sodium chloride 0.9 % bolus 250 mL (has no administration in time range)  vancomycin (VANCOCIN) IVPB 1000 mg/200 mL premix (1,000 mg Intravenous New Bag/Given 12/15/17 1820)  potassium chloride SA (K-DUR,KLOR-CON) CR tablet 40 mEq (has no administration in time range)  piperacillin-tazobactam (ZOSYN) IVPB 3.375 g (has no administration in time range)     Initial Impression / Assessment and Plan / ED Course  I have reviewed the triage vital signs and the nursing notes.  Pertinent labs & imaging results that were available during my care of the patient were reviewed by me and considered in my medical decision making (see chart for details).  Clinical Course as of Dec 16 1819  Mon Dec 15, 2017  1713 Consult: Reviewed with Dr. Constance Goltzlen [facility].  Suggest to initiate antibiotics.  Try to get MRI, with contrast if possible.  Will consult on the patient with hospitalist admission.   [MP]    Clinical Course User Index [MP] Arby BarrettePfeiffer, Cameren Odwyer, MD   consult: Tried hospitalist for admission.  Final Clinical Impressions(s) / ED Diagnoses   Final diagnoses:  Cellulitis of right lower extremity  Fever, unspecified fever cause  SIRS (systemic inflammatory response syndrome) (HCC)   Presents with malaise and weakness.  Fever of 102 identified by EMS.  Started with symptoms of chills yesterday.  Review of systems and physical exam, only positive finding is aspiration site of a recurrent pseudotumor of the soft tissue which is erythematous and suggestive of cellulitis.  Patient reports  there was no erythema prior to the procedure.  This time, treatment will be initiated for cellulitis.  Consultation has been placed to orthopedic surgery as outlined above.  Consultation to hospitalist for admission. ED Discharge  Orders    None       Arby Barrette, MD 12/15/17 [phone]

## 2017-12-16 ENCOUNTER — Inpatient Hospital Stay (HOSPITAL_COMMUNITY): Payer: Medicare Other

## 2017-12-16 DIAGNOSIS — D649 Anemia, unspecified: Secondary | ICD-10-CM

## 2017-12-16 LAB — CBC
HCT: 33.4 % — ABNORMAL LOW (ref 39.0–52.0)
HEMOGLOBIN: 11 g/dL — AB (ref 13.0–17.0)
MCH: 32.8 pg (ref 26.0–34.0)
MCHC: 32.9 g/dL (ref 30.0–36.0)
MCV: 99.7 fL (ref 78.0–100.0)
PLATELETS: 112 10*3/uL — AB (ref 150–400)
RBC: 3.35 MIL/uL — AB (ref 4.22–5.81)
RDW: 13.1 % (ref 11.5–15.5)
WBC: 8 10*3/uL (ref 4.0–10.5)

## 2017-12-16 LAB — COMPREHENSIVE METABOLIC PANEL
ALT: 15 U/L (ref 0–44)
ANION GAP: 6 (ref 5–15)
AST: 21 U/L (ref 15–41)
Albumin: 2.5 g/dL — ABNORMAL LOW (ref 3.5–5.0)
Alkaline Phosphatase: 43 U/L (ref 38–126)
BUN: 12 mg/dL (ref 8–23)
CHLORIDE: 109 mmol/L (ref 98–111)
CO2: 23 mmol/L (ref 22–32)
CREATININE: 0.79 mg/dL (ref 0.61–1.24)
Calcium: 8 mg/dL — ABNORMAL LOW (ref 8.9–10.3)
Glucose, Bld: 86 mg/dL (ref 70–99)
Potassium: 3.5 mmol/L (ref 3.5–5.1)
Sodium: 138 mmol/L (ref 135–145)
Total Bilirubin: 2.1 mg/dL — ABNORMAL HIGH (ref 0.3–1.2)
Total Protein: 4.9 g/dL — ABNORMAL LOW (ref 6.5–8.1)

## 2017-12-16 MED ORDER — GADOBENATE DIMEGLUMINE 529 MG/ML IV SOLN
15.0000 mL | Freq: Once | INTRAVENOUS | Status: AC | PRN
  Administered 2017-12-16: 14 mL via INTRAVENOUS

## 2017-12-16 MED ORDER — POTASSIUM CHLORIDE CRYS ER 20 MEQ PO TBCR
20.0000 meq | EXTENDED_RELEASE_TABLET | Freq: Once | ORAL | Status: AC
  Administered 2017-12-16: 20 meq via ORAL
  Filled 2017-12-16: qty 1

## 2017-12-16 MED ORDER — ZOLPIDEM TARTRATE 5 MG PO TABS
5.0000 mg | ORAL_TABLET | Freq: Every evening | ORAL | Status: DC | PRN
  Administered 2017-12-16 – 2017-12-17 (×2): 5 mg via ORAL
  Filled 2017-12-16 (×2): qty 1

## 2017-12-16 MED ORDER — PIPERACILLIN-TAZOBACTAM 3.375 G IVPB
3.3750 g | Freq: Three times a day (TID) | INTRAVENOUS | Status: DC
  Administered 2017-12-16 – 2017-12-18 (×6): 3.375 g via INTRAVENOUS
  Filled 2017-12-16 (×7): qty 50

## 2017-12-16 MED ORDER — LORAZEPAM 2 MG/ML IJ SOLN: 0.5000 mg | Freq: Once | INTRAMUSCULAR | Status: DC

## 2017-12-16 NOTE — Progress Notes (Addendum)
[facility] PROGRESS NOTE  MARGUERITE JARBOE WGN:[id number] DOB: 04/03/1935 DOA: 12/15/2017  PCP: Elijio Miles., MD  Brief History/Interval Summary: 83 year old Caucasian male with a past medical history of BPH, paroxysmal atrial fibrillation on anticoagulation with apixaban, on Flecainide, essential hypertension, peripheral vascular disease previous history of right hip replacement with revision surgery, with development of fluid collections requiring aspiration.  He underwent aspiration by IR at [facility] on June 26 from the right thigh.  Subsequently developed worsening pain swelling redness and fever.  Came into the hospital.  Was hospitalized with cellulitis.  MRI was ordered.  Orthopedics was consulted.  Reason for Visit: Cellulitis involving right thigh with fluid collection  Consultants: Orthopedics, Dr. Charlann Boxer  Procedures: None yet  Antibiotics: Currently on vancomycin and Zosyn  Subjective/Interval History: Patient denies any significant pain in the right leg.  Overall he feels slightly better compared to yesterday.  Asking for diet to be initiated.  Apparently seen by somebody from orthopedics this morning who told him that he will not need any surgery.  ROS: Denies any nausea or vomiting  Objective:  Vital Signs  Vitals:   12/15/17 1955 12/15/17 2241 12/16/17 0552 12/16/17 0816  BP: (!) 117/59 (!) 120/54 (!) 125/52 (!) 126/57  Pulse: 71 69 78 72  Resp: 18  16 18   Temp: 98 F (36.7 C)  98.2 F (36.8 C) 98.4 F (36.9 C)  TempSrc: Oral  Oral Oral  SpO2: 100%  96% 96%  Weight:      Height:        Intake/Output Summary (Last 24 hours) at 12/16/2017 1204 Last data filed at 12/16/2017 1000 Gross per 24 hour  Intake 927.45 ml  Output 800 ml  Net 127.45 ml   Filed Weights   12/15/17 1454  Weight: 67.1 kg (148 lb)    General appearance: alert, cooperative, appears stated age and no distress Head: Normocephalic, without obvious abnormality,  atraumatic Resp: clear to auscultation bilaterally Cardio: regular rate and rhythm, S1, S2 normal, no murmur, click, rub or gallop GI: soft, non-tender; bowel sounds normal; no masses,  no organomegaly Extremities: Continues to have swelling in the right thigh area laterally.  Erythema appears to have improved.  Mildly tender to palpation. Neurologic: Awake alert.  Oriented x3.  No focal neurological deficits.  Lab Results:  Data Reviewed: I have personally reviewed following labs and imaging studies  CBC: Recent Labs  Lab 12/15/17 1519 12/16/17 0628  WBC 11.0* 8.0  NEUTROABS 9.5*  --   HGB 13.0 11.0*  HCT 38.9* 33.4*  MCV 97.5 99.7  PLT 135* 112*    Basic Metabolic Panel: Recent Labs  Lab 12/15/17 1519 12/16/17 0628  NA 138 138  K 3.2* 3.5  CL 104 109  CO2 24 23  GLUCOSE 158* 86  BUN 16 12  CREATININE 0.90 0.79  CALCIUM 8.8* 8.0*    GFR: Estimated Creatinine Clearance: 69.9 mL/min (by C-G formula based on SCr of 0.79 mg/dL).  Liver Function Tests: Recent Labs  Lab 12/15/17 1519 12/16/17 0628  AST 28 21  ALT 18 15  ALKPHOS 54 43  BILITOT 2.1* 2.1*  PROT 5.8* 4.9*  ALBUMIN 3.1* 2.5*     Radiology Studies: Dg Chest 2 View  Result Date: 12/15/2017 CLINICAL DATA:  Fever. EXAM: CHEST - 2 VIEW COMPARISON:  Chest radiographs and chest CTA 10/14/2017 FINDINGS: The cardiac silhouette is borderline enlarged. No airspace consolidation, edema, pleural effusion, or pneumothorax is identified. Old T7 and T8 compression  fractures are again noted with prior augmentation of the former. IMPRESSION: No active cardiopulmonary disease. Electronically Signed   By: Sebastian Ache M.D.   On: 12/15/2017 16:24   Mr Femur Right W Wo Contrast  Result Date: 12/16/2017 CLINICAL DATA:  Right thigh pain and swelling. EXAM: MRI OF THE RIGHT FEMUR WITHOUT AND WITH CONTRAST TECHNIQUE: Multiplanar, multisequence MR imaging of the right thigh was performed both before and after administration  of intravenous contrast. CONTRAST:  14mL MULTIHANCE GADOBENATE DIMEGLUMINE 529 MG/ML IV SOLN COMPARISON:  MRI 01/09/2017 FINDINGS: Long stem redo right total hip arthroplasty appears stable. No findings for periprosthetic fracture or obvious changes of osteomyelitis. Stable large complex joint effusion and periarticular fluid most consistent with metallosis. Persistent layering inflammatory debris within the complex fluid. There is also a stable more solid appearing masslike component posteriorly involving the ischial tuberosity. There is a new large complex fluid collection extending down the posterior compartment of the thigh which demonstrates a fluid fluid level and rim-like enhancement. This has increased T1 signal intensity. It could be a hematoma or progression of the metal low cyst down into the thigh. No obvious gas is demonstrated to suggest an abscess. No surrounding inflammatory changes in the hamstring muscles. There is mild edema like signal abnormality in the superficial aspect of the vastus lateralis muscle. This fluid collection is 30 cm long and 7 cm wide. IMPRESSION: 1. 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 2. 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 3. New large fluid collection extending down the posterior compartment of the thigh down to just above the level of the knee. This could be a continuation of the metallosis process or more likely a large hematoma with fluid fluid level. Abscess cannot be totally excluded but appears less likely. Electronically Signed   By: Rudie Meyer M.D.   On: 12/16/2017 08:26     Medications:  Scheduled: . atorvastatin  40 mg Oral Daily  . docusate sodium  100 mg Oral BID  . dorzolamide-timolol  1 drop Both Eyes q morning - 10a  . finasteride  5 mg Oral Daily  . flecainide   50 mg Oral Q12H  . iron polysaccharides  150 mg Oral Daily  . LORazepam  0.5 mg Intravenous Once  . multivitamin  1 tablet Oral Daily  . pantoprazole  40 mg Oral Daily  . tamsulosin  0.4 mg Oral QPC supper   Continuous: . sodium chloride Stopped (12/16/17 0327)  . piperacillin-tazobactam (ZOSYN)  IV Stopped (12/16/17 0949)  . 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  He underwent aspiration of the fluid from his right thigh area a few days ago.  Came in with fever and had changes suggestive of cellulitis.  MRI was done overnight which shows mostly stable findings but new fluid collection inferiorly.  This could be related to the recent procedure.  Hematoma is another possibility although thought to be less likely and his hemoglobin was not low at the time of admission.  Orthopedics was consulted.  We await their input.  Does not appear that he is scheduled for any surgery so okay to resume diet.  Continue with vancomycin  and Zosyn for now.  Follow-up on cultures.  No evidence for sepsis currently.  Falls No evidence for syncopal episode.  Probably mechanical due to pain in the right thigh area along with infection.  PT and OT evaluation.  Paroxysmal atrial fibrillation Long-standing history of same.  [place] [facility].  He is on diltiazem flecainide and apixaban.  Apixaban on hold in case patient needs surgical intervention.  Continue tonight.  Diltiazem was held due to borderline low blood pressures.  History of BPH Continue with home medications including Flomax and finasteride.  Hypokalemia Repleted.  Normocytic anemia Mild drop in hemoglobin is likely dilutional.  Patient did get a lot of fluids  yesterday.  Continue to monitor.  DVT Prophylaxis: SCDs will be ordered    Code Status: Full code Family Communication: Discussed with the patient Disposition Plan: Management as outlined above.    LOS: 1 day   Osvaldo ShipperGokul Micaiah Litle  [facility] Pager [phone] 12/16/2017, 12:04 PM  If 7PM-7AM, please contact night-coverage at [email or web address], password [email or web address]

## 2017-12-16 NOTE — Consult Note (Signed)
Chief Complaint: Patient was seen in consultation today for image guided aspiration right thigh abscess. Chief Complaint  Patient presents with  . Leg Pain  . Fall   at the request of Dr. Durene Romans  Referring Physician(s): Durene Romans, MD  Supervising Physician: Oley Balm  Patient Status: [facility] - In-pt  History of Present Illness: Travis Wolfe is a 83 y.o. 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 Posterior right thigh abscess previously aspirated by IR at [facility] which patient states was about a week ago and provided him with pain relief, although the abscess reoccurred and he was taken to ED at [facility] by his wife who was concerned that he may be septic as he has a history of this as well.   MRI today:  IMPRESSION: 1. 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 2. 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 3. New large fluid collection extending down the posterior compartment of the thigh down to just above the level of the knee. This could be a continuation of the metallosis process or more likely a large hematoma with fluid fluid level. Abscess cannot be totally excluded but appears less likely.  Imaging reviewed with Dr Deanne Coffer-- approves procedure Now scheduled for 7/3 in IR   Past Medical History:  Diagnosis Date  . Arthritis   . GERD (gastroesophageal reflux disease)   . Hypertension     Past Surgical History:  Procedure Laterality Date  . HERNIA REPAIR Bilateral   . KYPHOPLASTY     t7  . LATERAL FUSION LUMBAR SPINE    . SHOULDER DEBRIDEMENT Right   . SHOULDER OPEN ROTATOR CUFF  REPAIR Left   . TOTAL HIP ARTHROPLASTY     done twice    Allergies: Patient has no known allergies.  Medications: Prior to Admission medications   Medication Sig Start Date End Date Taking? Authorizing Provider  atorvastatin (LIPITOR) 40 MG tablet Take 40 mg by mouth daily. 12/10/17  Yes [provider]  COSOPT PF 22.3-6.8 MG/ML SOLN ophthalmic solution Place 1 drop into both eyes every morning. 10/22/17  Yes [provider]  ELIQUIS 5 MG TABS tablet Take 5 mg by mouth 2 (two) times daily. 10/27/17  Yes [provider]  finasteride (PROSCAR) 5 MG tablet Take 5 mg by mouth daily.   Yes [provider]  flecainide (TAMBOCOR) 50 MG tablet Take 50 mg by mouth 2 (two) times daily.   Yes [provider]  iron polysaccharides (NIFEREX) 150 MG capsule Take 150 mg by mouth daily.   Yes [provider]  LUMIGAN 0.01 % SOLN  10/22/12  Yes [provider]  LYCOPENE PO Take 1 tablet by mouth daily.   Yes [provider]  MAGNESIUM CARBONATE PO Take 1 tablet by mouth daily.   Yes [provider]  multivitamin-lutein (OCUVITE-LUTEIN) CAPS capsule Take 1 capsule by mouth daily.   Yes [provider]  NEXIUM 40 MG capsule Take 40 mg by mouth daily.  11/19/12  Yes [provider]  Zinc Acetate, Oral, (ZINC ACETATE PO) Take 1 tablet by mouth daily.   Yes [provider]     Family History  Problem Relation  Age of Onset  . Hypertension Mother   . Cancer Mother     Social History   Socioeconomic History  . Marital status: Married    Spouse name: Not on file  . Number of children: Not on file  . Years of education: Not on file  . Highest education level: Not on file  Occupational History  . Not on file  Social Needs  . Financial resource strain: Not on file  . Food insecurity:    Worry: Not on file    Inability: Not on file  . Transportation needs:    Medical: Not on file    Non-medical: Not  on file  Tobacco Use  . Smoking status: Never Smoker  . Smokeless tobacco: Never Used  Substance and Sexual Activity  . Alcohol use: Yes  . Drug use: No  . Sexual activity: Not on file  Lifestyle  . Physical activity:    Days per week: Not on file    Minutes per session: Not on file  . Stress: Not on file  Relationships  . Social connections:    Talks on phone: Not on file    Gets together: Not on file    Attends religious service: Not on file    Active member of club or organization: Not on file    Attends meetings of clubs or organizations: Not on file    Relationship status: Not on file  Other Topics Concern  . Not on file  Social History Narrative  . Not on file     Review of Systems: A 12 point ROS discussed and pertinent positives are indicated in the HPI above.  All other systems are negative.  Review of Systems  Constitutional: Negative for appetite change, chills and fever.  Respiratory: Negative for cough and shortness of breath.   Cardiovascular: Negative for chest pain.  Gastrointestinal: Negative for abdominal pain, nausea and vomiting.  Genitourinary: Negative for difficulty urinating.  Musculoskeletal: Positive for gait problem. Negative for arthralgias and joint swelling.  Skin: Positive for color change.  Neurological: Positive for weakness.  Psychiatric/Behavioral: Negative for agitation, behavioral problems and confusion.    Vital Signs: BP (!) 130/53 (BP Location: Right Arm)   Pulse 72   Temp 98.4 F (36.9 C) (Oral)   Resp 18   Ht 5\' 8"  (1.727 m)   Wt 148 lb (67.1 kg)   SpO2 96%   BMI 22.50 kg/m   Physical Exam  Constitutional: He is oriented to person, place, and time. He appears well-developed and well-nourished. No distress.  HENT:  Head: Normocephalic and atraumatic.  Cardiovascular: Normal rate and regular rhythm.  Murmur heard. Pulmonary/Chest: Effort normal and breath sounds normal. He has no wheezes.  Abdominal: Soft. Bowel  sounds are normal. He exhibits no distension. There is no tenderness.  Musculoskeletal: Normal range of motion.  Neurological: He is alert and oriented to person, place, and time.  Skin: Skin is warm and dry. He is not diaphoretic.  Psychiatric: He has a normal mood and affect. His behavior is normal. Judgment and thought content normal.    Imaging: Dg Chest 2 View  Result Date: 12/15/2017 CLINICAL DATA:  Fever. EXAM: CHEST - 2 VIEW COMPARISON:  Chest radiographs and chest CTA 10/14/2017 FINDINGS: The cardiac silhouette is borderline enlarged. No airspace consolidation, edema, pleural effusion, or pneumothorax is identified. Old T7 and T8 compression fractures are again noted with prior augmentation of the former. IMPRESSION: No active cardiopulmonary disease. Electronically Signed  By: Sebastian Ache M.D.   On: 12/15/2017 16:24   Mr Femur Right W Wo Contrast  Result Date: 12/16/2017 CLINICAL DATA:  Right thigh pain and swelling. EXAM: MRI OF THE RIGHT FEMUR WITHOUT AND WITH CONTRAST TECHNIQUE: Multiplanar, multisequence MR imaging of the right thigh was performed both before and after administration of intravenous contrast. CONTRAST:  14mL MULTIHANCE GADOBENATE DIMEGLUMINE 529 MG/ML IV SOLN COMPARISON:  MRI 01/09/2017 FINDINGS: Long stem redo right total hip arthroplasty appears stable. No findings for periprosthetic fracture or obvious changes of osteomyelitis. Stable large complex joint effusion and periarticular fluid most consistent with metallosis. Persistent layering inflammatory debris within the complex fluid. There is also a stable more solid appearing masslike component posteriorly involving the ischial tuberosity. There is a new large complex fluid collection extending down the posterior compartment of the thigh which demonstrates a fluid fluid level and rim-like enhancement. This has increased T1 signal intensity. It could be a hematoma or progression of the metal low cyst down into the  thigh. No obvious gas is demonstrated to suggest an abscess. No surrounding inflammatory changes in the hamstring muscles. There is mild edema like signal abnormality in the superficial aspect of the vastus lateralis muscle. This fluid collection is 30 cm long and 7 cm wide. IMPRESSION: 1. 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 2. 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 3. New large fluid collection extending down the posterior compartment of the thigh down to just above the level of the knee. This could be a continuation of the metallosis process or more likely a large hematoma with fluid fluid level. Abscess cannot be totally excluded but appears less likely. Electronically Signed   By: Rudie Meyer M.D.   On: 12/16/2017 08:26    Labs:  CBC: Recent Labs    12/15/17 1519 12/16/17 0628  WBC 11.0* 8.0  HGB 13.0 11.0*  HCT 38.9* 33.4*  PLT 135* 112*    COAGS: No results for input(s): INR, APTT in the last 8760 hours.  BMP: Recent Labs    12/15/17 1519 12/16/17 0628  NA 138 138  K 3.2* 3.5  CL 104 109  CO2 24 23  GLUCOSE 158* 86  BUN 16 12  CALCIUM 8.8* 8.0*  CREATININE 0.90 0.79  GFRNONAA >60 >60  GFRAA >60 >60    LIVER FUNCTION TESTS: Recent Labs    12/15/17 1519 12/16/17 0628  BILITOT 2.1* 2.1*  AST 28 21  ALT 18 15  ALKPHOS 54 43  PROT 5.8* 4.9*  ALBUMIN 3.1* 2.5*    Assessment and Plan:   1. Abscess right thigh - recurrent; likely d/t ongoing complications of previous total right hip arthroplasty. Several previous aspirations per patient - most recently at [facility] on 7/1 by Dr. 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 Orthopedics was consulted who requests repeat aspiration. Will proceed with image guided aspiration of right hip abscess.  Risks and  benefits discussed with the patient including, but not limited to bleeding, infection, damage to adjacent structures or low yield requiring additional tests.  All of the patient's questions were answered, patient is agreeable to proceed. Consent signed and in chart.   Thank you for this interesting consult.  I greatly enjoyed meeting Travis Wolfe and look forward to participating in their care.  A copy of this report was sent to  the requesting provider on this date.  Electronically Signed: Robet Leu, PA-C 12/16/2017, 2:17 PM   I spent a total of 40 Minutes  in face to face in clinical consultation, greater than 50% of which was counseling/coordinating care for image guided aspiration of right thigh abscess.

## 2017-12-16 NOTE — Progress Notes (Signed)
Writer was present for all medication passes between 7am-3pm with the student nurse. Writer concurs with Risk managerstudent nurse assessment. Patient is alert and oriented and currently has no complaints.

## 2017-12-16 NOTE — Consult Note (Signed)
Reason for Consult: right thigh swelling as noted Referring Physician: Lance MussKrishnan  [name] is an 83 y.o. male.  HPI: 83 year old gentleman presented to the emergency room after 2 separate falls related to weakness.  This weakness was felt to be related to perhaps some early sepsis or infection.  He has a long-standing history of a right total hip arthroplasty that has been complicated by metallosis based on the description he is provided to me.  His last revision surgery was about 18 months ago.  This was performed [facility].  He had his femoral head changed from metal to ceramic.  Despite this he has had persistent swelling in the anterior aspect of his hip.  He has also had an unusual distribution of swelling in the posterior aspect of his thigh that became significant enough that he was sent to interventional radiology in [facility] where they aspirated fluid from the posterior aspect of his thigh.  He noted immediate relief.  This was done recently.  At the time of this presentation his wife a retired Engineer, civil (consulting)nurse of 40 years was concerned about the amount of redness around this aspiration site.  Hence we are consulted for evaluation.  Based on my discussion with the emergency room physicians yesterday he was placed on antibiotics. At the time of my evaluation this morning he reports no significant pain but does continue to recognize fullness in the anterior hip and posterior thigh.  He also reports feeling weak over the past couple days.  He has been very active throughout his entire life.  He wishes to remain so.  Past Medical History:  Diagnosis Date  . Arthritis   . GERD (gastroesophageal reflux disease)   . Hypertension     Past Surgical History:  Procedure Laterality Date  . HERNIA REPAIR Bilateral   . KYPHOPLASTY     t7  . LATERAL FUSION LUMBAR SPINE    . SHOULDER DEBRIDEMENT Right   . SHOULDER OPEN ROTATOR CUFF REPAIR Left   . TOTAL HIP ARTHROPLASTY     done twice    Family  History  Problem Relation Age of Onset  . Hypertension Mother   . Cancer Mother     Social History:  reports that he has never smoked. He has never used smokeless tobacco. He reports that he drinks alcohol. He reports that he does not use drugs.  Allergies: No Known Allergies  Medications:  I have reviewed the patient's current medications. Scheduled: . atorvastatin  40 mg Oral Daily  . docusate sodium  100 mg Oral BID  . dorzolamide-timolol  1 drop Both Eyes q morning - 10a  . finasteride  5 mg Oral Daily  . flecainide  50 mg Oral Q12H  . iron polysaccharides  150 mg Oral Daily  . LORazepam  0.5 mg Intravenous Once  . multivitamin  1 tablet Oral Daily  . pantoprazole  40 mg Oral Daily  . potassium chloride  20 mEq Oral Once  . tamsulosin  0.4 mg Oral QPC supper    Results for orders placed or performed during the hospital encounter of 12/15/17 (from the past 24 hour(s))  Comprehensive metabolic panel     Status: Abnormal   Collection Time: 12/15/17  3:19 PM  Result Value Ref Range   Sodium 138 135 - 145 mmol/L   Potassium 3.2 (L) 3.5 - 5.1 mmol/L   Chloride 104 98 - 111 mmol/L   CO2 24 22 - 32 mmol/L   Glucose, Bld  158 (H) 70 - 99 mg/dL   BUN 16 8 - 23 mg/dL   Creatinine, Ser 1.61 0.61 - 1.24 mg/dL   Calcium 8.8 (L) 8.9 - 10.3 mg/dL   Total Protein 5.8 (L) 6.5 - 8.1 g/dL   Albumin 3.1 (L) 3.5 - 5.0 g/dL   AST 28 15 - 41 U/L   ALT 18 0 - 44 U/L   Alkaline Phosphatase 54 38 - 126 U/L   Total Bilirubin 2.1 (H) 0.3 - 1.2 mg/dL   GFR calc non Af Amer >60 >60 mL/min   GFR calc Af Amer >60 >60 mL/min   Anion gap 10 5 - 15  CBC WITH DIFFERENTIAL     Status: Abnormal   Collection Time: 12/15/17  3:19 PM  Result Value Ref Range   WBC 11.0 (H) 4.0 - 10.5 K/uL   RBC 3.99 (L) 4.22 - 5.81 MIL/uL   Hemoglobin 13.0 13.0 - 17.0 g/dL   HCT 09.6 (L) 04.5 - 40.9 %   MCV 97.5 78.0 - 100.0 fL   MCH 32.6 26.0 - 34.0 pg   MCHC 33.4 30.0 - 36.0 g/dL   RDW 81.1 91.4 - 78.2 %    Platelets 135 (L) 150 - 400 K/uL   Neutrophils Relative % 86 %   Lymphocytes Relative 3 %   Monocytes Relative 11 %   Eosinophils Relative 0 %   Basophils Relative 0 %   Neutro Abs 9.5 (H) 1.7 - 7.7 K/uL   Lymphs Abs 0.3 (L) 0.7 - 4.0 K/uL   Monocytes Absolute 1.2 (H) 0.1 - 1.0 K/uL   Eosinophils Absolute 0.0 0.0 - 0.7 K/uL   Basophils Absolute 0.0 0.0 - 0.1 K/uL   WBC Morphology FEW BANDS AND FEW ATYPICAL LYMPHS NOTED   I-Stat CG4 Lactic Acid, ED  (not at  [facility])     Status: None   Collection Time: 12/15/17  3:40 PM  Result Value Ref Range   Lactic Acid, Venous 1.82 0.5 - 1.9 mmol/L  I-Stat CG4 Lactic Acid, ED  (not at  [facility])     Status: None   Collection Time: 12/15/17  6:13 PM  Result Value Ref Range   Lactic Acid, Venous 1.43 0.5 - 1.9 mmol/L  Urinalysis, Routine w reflex microscopic     Status: Abnormal   Collection Time: 12/15/17  7:52 PM  Result Value Ref Range   Color, Urine YELLOW YELLOW   APPearance CLEAR CLEAR   Specific Gravity, Urine 1.009 1.005 - 1.030   pH 6.0 5.0 - 8.0   Glucose, UA NEGATIVE NEGATIVE mg/dL   Hgb urine dipstick SMALL (A) NEGATIVE   Bilirubin Urine NEGATIVE NEGATIVE   Ketones, ur NEGATIVE NEGATIVE mg/dL   Protein, ur NEGATIVE NEGATIVE mg/dL   Nitrite NEGATIVE NEGATIVE   Leukocytes, UA NEGATIVE NEGATIVE   RBC / HPF 0-5 0 - 5 RBC/hpf   WBC, UA 0-5 0 - 5 WBC/hpf   Bacteria, UA NONE SEEN NONE SEEN  Comprehensive metabolic panel     Status: Abnormal   Collection Time: 12/16/17  6:28 AM  Result Value Ref Range   Sodium 138 135 - 145 mmol/L   Potassium 3.5 3.5 - 5.1 mmol/L   Chloride 109 98 - 111 mmol/L   CO2 23 22 - 32 mmol/L   Glucose, Bld 86 70 - 99 mg/dL   BUN 12 8 - 23 mg/dL   Creatinine, Ser 9.56 0.61 - 1.24 mg/dL   Calcium 8.0 (L) 8.9 - 10.3 mg/dL  Total Protein 4.9 (L) 6.5 - 8.1 g/dL   Albumin 2.5 (L) 3.5 - 5.0 g/dL   AST 21 15 - 41 U/L   ALT 15 0 - 44 U/L   Alkaline Phosphatase 43 38 - 126 U/L   Total Bilirubin 2.1 (H) 0.3 - 1.2  mg/dL   GFR calc non Af Amer >60 >60 mL/min   GFR calc Af Amer >60 >60 mL/min   Anion gap 6 5 - 15  CBC     Status: Abnormal   Collection Time: 12/16/17  6:28 AM  Result Value Ref Range   WBC 8.0 4.0 - 10.5 K/uL   RBC 3.35 (L) 4.22 - 5.81 MIL/uL   Hemoglobin 11.0 (L) 13.0 - 17.0 g/dL   HCT 40.9 (L) 81.1 - 91.4 %   MCV 99.7 78.0 - 100.0 fL   MCH 32.8 26.0 - 34.0 pg   MCHC 32.9 30.0 - 36.0 g/dL   RDW 78.2 95.6 - 21.3 %   Platelets 112 (L) 150 - 400 K/uL     X-ray: Prior radiographs available in epic/canopy reveal a revised total hip with a solution type fully porous-coated stem that appears to be well fixed as well as a well fixed acetabular component.  No significant ostial lysis appreciated  ROS  As noted through the HPI as well his by his admitting history and physical.  Blood pressure (!) 126/57, pulse 72, temperature 98.4 F (36.9 C), temperature source Oral, resp. rate 18, height 5\' 8"  (1.727 m), weight 67.1 kg (148 lb), SpO2 96 %.  Physical Exam Very pleasant 83 year old male awake alert and oriented.  In no acute distress. Examination of his right lower extremity reveals some swelling in the anterior aspect of his hip joint area as well as some posterior distal thigh. The area of his previous aspiration had only a faint amount of erythema surrounding the area of the aspiration site there was no streaking cellulitis.  There is no fluctuant identified only a fluid-filled area.  This was mobile, not hardened, nontender. Otherwise neurovascular intact. Otherwise normal left lower extremity and upper extremities other than his appreciated weakness  Assessment/Plan: Assessment: History of right total hip arthroplasty and subsequent failure related to metallosis per his report with recurrent swelling in the anterior aspect of his hip and posterior distal thigh  Plan: In the office today reviewed his history and his concerns.  We also reviewed his reason for admission.  At this  point I think it is prudent for [place] to repeat an aspiration of not only his hip joint with fluid sent for cell count, Gram stain and culture to rule out infection of the hip joint as the source of his infection but will also have interventional radiology aspirate this posterior thigh to confirm no abscess present.  Long-term he may very well require repeat I&D of his hip for debridement purposes of any retained metallosis.  Further discussion of this will be had as an outpatient.  We will await the results of the aspiration for further in-house treatment as needed.  Otherwise I recommend continued IV antibiotics with transition to p.o. antibiotics pending aspiration as he seems to have responded quite well to the initial doses of antibiotics.  [name] 12/16/2017, 12:23 PM

## 2017-12-16 NOTE — Progress Notes (Signed)
Patient arrived to the unit via bed from the emergency department. Patient is alert and oriented x 4.  No complaints of pain.  Skin assessment complete. Skin tear to left forearm and above the right elbow.  Ecchymosis to the arms bilaterally.  Redness  And swelling to the right leg.  Educated the patient on how to reach the staff on the unit. Activated the bed alarm, lowered the bed and placed the call light within reach.  Will continue to monitor the patient and notify MD as needed

## 2017-12-17 ENCOUNTER — Encounter (HOSPITAL_COMMUNITY): Payer: Self-pay | Admitting: Interventional Radiology

## 2017-12-17 ENCOUNTER — Inpatient Hospital Stay (HOSPITAL_COMMUNITY): Payer: Medicare Other

## 2017-12-17 DIAGNOSIS — L03115 Cellulitis of right lower limb: Secondary | ICD-10-CM

## 2017-12-17 DIAGNOSIS — I480 Paroxysmal atrial fibrillation: Secondary | ICD-10-CM

## 2017-12-17 DIAGNOSIS — R509 Fever, unspecified: Secondary | ICD-10-CM

## 2017-12-17 HISTORY — PX: IR US GUIDE BX ASP/DRAIN: IMG2392

## 2017-12-17 LAB — BASIC METABOLIC PANEL
Anion gap: 3 — ABNORMAL LOW (ref 5–15)
BUN: 9 mg/dL (ref 8–23)
CHLORIDE: 110 mmol/L (ref 98–111)
CO2: 25 mmol/L (ref 22–32)
Calcium: 8.1 mg/dL — ABNORMAL LOW (ref 8.9–10.3)
Creatinine, Ser: 0.69 mg/dL (ref 0.61–1.24)
GFR calc Af Amer: >60 mL/min (ref >60)
GFR calc non Af Amer: >60 mL/min (ref >60)
GLUCOSE: 99 mg/dL (ref 70–99)
POTASSIUM: 4.1 mmol/L (ref 3.5–5.1)
Sodium: 138 mmol/L (ref 135–145)

## 2017-12-17 LAB — CBC
HCT: 33 % — ABNORMAL LOW (ref 39.0–52.0)
HEMOGLOBIN: 10.9 g/dL — AB (ref 13.0–17.0)
MCH: 32.6 pg (ref 26.0–34.0)
MCHC: 33 g/dL (ref 30.0–36.0)
MCV: 98.8 fL (ref 78.0–100.0)
Platelets: 138 10*3/uL — ABNORMAL LOW (ref 150–400)
RBC: 3.34 MIL/uL — AB (ref 4.22–5.81)
RDW: 13 % (ref 11.5–15.5)
WBC: 9 10*3/uL (ref 4.0–10.5)

## 2017-12-17 LAB — PROTIME-INR
INR: 1.2
Prothrombin Time: 15.1 seconds (ref 11.4–15.2)

## 2017-12-17 MED ORDER — FENTANYL CITRATE (PF) 100 MCG/2ML IJ SOLN
INTRAMUSCULAR | Status: AC
  Filled 2017-12-17: qty 2

## 2017-12-17 MED ORDER — OXYCODONE-ACETAMINOPHEN 5-325 MG PO TABS
1.0000 | ORAL_TABLET | ORAL | Status: DC | PRN
  Administered 2017-12-17 (×2): 1 via ORAL
  Administered 2017-12-18: 2 via ORAL
  Filled 2017-12-17: qty 1
  Filled 2017-12-17 (×2): qty 2

## 2017-12-17 MED ORDER — SODIUM CHLORIDE 0.9 % IV SOLN
INTRAVENOUS | Status: DC | PRN
  Administered 2017-12-17: 15:00:00 via INTRAVENOUS

## 2017-12-17 MED ORDER — POLYETHYLENE GLYCOL 3350 17 G PO PACK: 17.0000 g | PACK | Freq: Every day | ORAL | Status: DC | PRN

## 2017-12-17 MED ORDER — MIDAZOLAM HCL 2 MG/2ML IJ SOLN
INTRAMUSCULAR | Status: AC | PRN
  Administered 2017-12-17 (×3): 1 mg via INTRAVENOUS

## 2017-12-17 MED ORDER — LIDOCAINE HCL (PF) 1 % IJ SOLN
INTRAMUSCULAR | Status: AC | PRN
  Administered 2017-12-17: 5 mL

## 2017-12-17 MED ORDER — MIDAZOLAM HCL 2 MG/2ML IJ SOLN
INTRAMUSCULAR | Status: AC
  Filled 2017-12-17: qty 2

## 2017-12-17 MED ORDER — FENTANYL CITRATE (PF) 100 MCG/2ML IJ SOLN
INTRAMUSCULAR | Status: AC | PRN
  Administered 2017-12-17 (×2): 50 ug via INTRAVENOUS

## 2017-12-17 MED ORDER — SODIUM CHLORIDE 0.9 % IV SOLN
INTRAVENOUS | Status: DC | PRN
  Administered 2017-12-17: 19:00:00 via INTRAVENOUS

## 2017-12-17 MED ORDER — LIDOCAINE HCL 1 % IJ SOLN
INTRAMUSCULAR | Status: AC
  Filled 2017-12-17: qty 20

## 2017-12-17 NOTE — Sedation Documentation (Signed)
Patient is resting comfortably. 

## 2017-12-17 NOTE — Procedures (Signed)
R thigh hematoma aspiration 200 cc dark fluid EBL 0 Comp 0

## 2017-12-17 NOTE — Progress Notes (Signed)
Patient states they way he was maneuvered for the I&D really irritated his back. Now in / pain. Dr. Isidoro Donningai paged about getting home oxy with tylenol ordered.Patient also wondered when his Eliquis would be restarted.  Awaiting response.

## 2017-12-17 NOTE — Progress Notes (Signed)
[facility]                                                                              Patient Demographics  Travis Wolfe, is a 83 y.o. male, DOB - 07/14/1934, VWU:[id number]  Admit date - 12/15/2017   Admitting Physician Osvaldo ShipperGokul Krishnan, MD  Outpatient Primary MD for the patient is Travis MilesWeaver, Travis W., MD  Outpatient specialists:   LOS - 2  days   Medical records reviewed and are as summarized below:    Chief Complaint  Patient presents with  . Leg Pain  . Fall       Brief summary   83 year old Caucasian male with a past medical history of BPH, paroxysmal atrial fibrillation on anticoagulation with apixaban, on Flecainide, essential hypertension, peripheral vascular disease previous history of right hip replacement with revision surgery, with development of fluid collections requiring aspiration.  He underwent aspiration by IR at [facility] on June 26 from the right thigh.  Subsequently developed worsening pain swelling redness and fever.  Presented to ED for further work-up and was hospitalized with cellulitis.  Assessment & Plan   Principal problem Right thigh cellulitis with complex fluid collection -Orthopedics has been consulted and seen by Dr. Charlann Boxerlin. 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  New large fluid collection extending down the posterior compartment of the thigh down to just above the level of the knee, continuation of metallosis versus large hematoma, abscess cannot  be excluded -Eliquis was held, IR was consulted -Patient was placed on IV vancomycin and Zosyn, continue for now, follow cultures after aspiration.  Blood cultures negative so far. -Per Dr. Bonnielee HaffHoss' note, right thigh hematoma aspiration with 200 cc of dark fluid.  Will hold Eliquis until evaluated by orthopedics and cleared.   Mechanical falls -No evidence of syncopal episode, follow PT OT evaluation  Paroxysmal atrial fibrillation -Followed by [facility], on diltiazem, flecainide and apixaban -Apixaban currently on hold, per INR is noted, hematoma was aspirated - will continue to hold apixaban until cleared by orthopedics  History of BPH Currently stable, continue Flomax, finasteride   Normocytic anemia H&H slightly low, hemoglobin 10.9, follow closely   Code Status: Full code DVT Prophylaxis:SCD's Family Communication: Discussed in detail with the patient, all imaging results, lab results explained to the patient and wife at the bedside   Disposition Plan: Once cleared by orthopedics  Time Spent in minutes 35 minutes  Procedures:  IR aspiration of the right thigh 7/3  Consultants:   Interventional radiology Orthopedics  Antimicrobials:   IV vancomycin 7/2  IV Zosyn 7/2   Medications  Scheduled Meds: . atorvastatin  40 mg Oral Daily  . docusate sodium  100  mg Oral BID  . dorzolamide-timolol  1 drop Both Eyes q morning - 10a  . fentaNYL      . finasteride  5 mg Oral Daily  . flecainide  50 mg Oral Q12H  . iron polysaccharides  150 mg Oral Daily  . lidocaine      . midazolam      . multivitamin  1 tablet Oral Daily  . pantoprazole  40 mg Oral Daily  . tamsulosin  0.4 mg Oral QPC supper   Continuous Infusions: . piperacillin-tazobactam (ZOSYN)  IV Stopped (12/17/17 0910)  . vancomycin Stopped (12/17/17 0654)   PRN Meds:.acetaminophen **OR** acetaminophen, ondansetron **OR** ondansetron (ZOFRAN) IV, oxyCODONE-acetaminophen, polyethylene  glycol, zolpidem   Antibiotics   Anti-infectives (From admission, onward)   Start     Dose/Rate Route Frequency Ordered Stop   12/16/17 1400  piperacillin-tazobactam (ZOSYN) IVPB 3.375 g     3.375 g 12.5 mL/hr over 240 Minutes Intravenous Every 8 hours 12/16/17 1259     12/16/17 0630  vancomycin (VANCOCIN) 500 mg in sodium chloride 0.9 % 100 mL IVPB     500 mg 100 mL/hr over 60 Minutes Intravenous Every 12 hours 12/15/17 1823     12/15/17 1900  piperacillin-tazobactam (ZOSYN) IVPB 3.375 g  Status:  Discontinued     3.375 g 12.5 mL/hr over 240 Minutes Intravenous Every 8 hours 12/15/17 1820 12/16/17 1259   12/15/17 1715  vancomycin (VANCOCIN) IVPB 1000 mg/200 mL premix     1,000 mg 200 mL/hr over 60 Minutes Intravenous  Once 12/15/17 1704 12/15/17 1921   12/15/17 1715  cefTRIAXone (ROCEPHIN) 1 g in sodium chloride 0.9 % 100 mL IVPB  Status:  Discontinued     1 g 200 mL/hr over 30 Minutes Intravenous  Once 12/15/17 1704 12/15/17 1820        Subjective:   Travis Wolfe was seen and examined today.  No acute complaints, awaiting procedure prior to my encounter.  Patient denies dizziness, chest pain, shortness of breath, abdominal pain, N/V/D/C, new weakness, numbess, tingling. No acute events overnight.    Objective:   Vitals:   12/17/17 1001 12/17/17 1005 12/17/17 1010 12/17/17 1016  BP: (!) 158/76 (!) 153/77 (!) 147/70 140/77  Pulse: 74 87 76 76  Resp: 20 17 16 18   Temp:      TempSrc:      SpO2: 100% 100% 100% 97%  Weight:      Height:        Intake/Output Summary (Last 24 hours) at 12/17/2017 1248 Last data filed at 12/17/2017 0934 Gross per 24 hour  Intake 350.05 ml  Output 1505 ml  Net -1154.95 ml     Wt Readings from Last 3 Encounters:  12/15/17 67.1 kg (148 lb)  12/02/12 68.1 kg (150 lb 3.2 oz)  04/30/12 67.1 kg (148 lb)     Exam  General: Alert and oriented x 3, NAD  Eyes: PERRLA, EOMI, Anicteric Sclera,  HEENT:  Atraumatic, normocephalic, normal  oropharynx  Cardiovascular: S1 S2 auscultated, Regular rate and rhythm.  Respiratory: Clear to auscultation bilaterally, no wheezing, rales or rhonchi  Gastrointestinal: Soft, nontender, nondistended, + bowel sounds  Ext: significant swelling in the right thigh, no significant tenderness, erythema improving  Neuro: no new deficits  Musculoskeletal: No digital cyanosis, clubbing  Skin: No rashes  Psych: Normal affect and demeanor, alert and oriented x3    Data Reviewed:  I have personally reviewed following labs and imaging studies  Micro Results Recent Results (from  the past 240 hour(s))  Blood Culture (routine x 2)     Status: None (Preliminary result)   Collection Time: 12/15/17  3:19 PM  Result Value Ref Range Status   Specimen Description BLOOD LEFT WRIST  Final   Special Requests   Final    BOTTLES DRAWN AEROBIC AND ANAEROBIC Blood Culture adequate volume   Culture   Final    NO GROWTH 2 DAYS Performed at [facility], [address] [place], [place] [address]    Report Status PENDING  Incomplete  Blood Culture (routine x 2)     Status: None (Preliminary result)   Collection Time: 12/15/17  4:00 PM  Result Value Ref Range Status   Specimen Description BLOOD RIGHT ANTECUBITAL  Final   Special Requests   Final    BOTTLES DRAWN AEROBIC AND ANAEROBIC Blood Culture adequate volume   Culture   Final    NO GROWTH 2 DAYS Performed at [facility], [address], [place], [place] [address]    Report Status PENDING  Incomplete    Radiology Reports Dg Chest 2 View  Result Date: 12/15/2017 CLINICAL DATA:  Fever. EXAM: CHEST - 2 VIEW COMPARISON:  Chest radiographs and chest CTA 10/14/2017 FINDINGS: The cardiac silhouette is borderline enlarged. No airspace consolidation, edema, pleural effusion, or pneumothorax is identified. Old T7 and T8 compression fractures are again noted with prior augmentation of the former. IMPRESSION: No active cardiopulmonary disease.  Electronically Signed   By: Sebastian Ache M.D.   On: 12/15/2017 16:24   Mr Femur Right W Wo Contrast  Result Date: 12/16/2017 CLINICAL DATA:  Right thigh pain and swelling. EXAM: MRI OF THE RIGHT FEMUR WITHOUT AND WITH CONTRAST TECHNIQUE: Multiplanar, multisequence MR imaging of the right thigh was performed both before and after administration of intravenous contrast. CONTRAST:  14mL MULTIHANCE GADOBENATE DIMEGLUMINE 529 MG/ML IV SOLN COMPARISON:  MRI 01/09/2017 FINDINGS: Long stem redo right total hip arthroplasty appears stable. No findings for periprosthetic fracture or obvious changes of osteomyelitis. Stable large complex joint effusion and periarticular fluid most consistent with metallosis. Persistent layering inflammatory debris within the complex fluid. There is also a stable more solid appearing masslike component posteriorly involving the ischial tuberosity. There is a new large complex fluid collection extending down the posterior compartment of the thigh which demonstrates a fluid fluid level and rim-like enhancement. This has increased T1 signal intensity. It could be a hematoma or progression of the metal low cyst down into the thigh. No obvious gas is demonstrated to suggest an abscess. No surrounding inflammatory changes in the hamstring muscles. There is mild edema like signal abnormality in the superficial aspect of the vastus lateralis muscle. This fluid collection is 30 cm long and 7 cm wide. IMPRESSION: 1. 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 2. 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 3. New large fluid collection extending down the posterior compartment of the thigh down to just above the level of the knee. This could be a continuation of the metallosis process or more likely a large hematoma with fluid fluid  level. Abscess cannot be totally excluded but appears less likely. Electronically Signed   By: Rudie Meyer M.D.   On: 12/16/2017 08:26   Ir US Guide Bx Asp/drain  Result Date: 12/17/2017 INDICATION: Right thigh fluid collection EXAM: ULTRASOUND-GUIDED ASPIRATION MEDICATIONS: The patient  is currently admitted to the hospital and receiving intravenous antibiotics. The antibiotics were administered within an appropriate time frame prior to the initiation of the procedure. ANESTHESIA/SEDATION: Fentanyl 100 mcg IV; Versed 2 mg IV Moderate Sedation Time:  10 minutes The patient was continuously monitored during the procedure by the interventional radiology nurse under my direct supervision. COMPLICATIONS: None immediate. PROCEDURE: Informed written consent was obtained from the patient after a thorough discussion of the procedural risks, benefits and alternatives. All questions were addressed. Maximal Sterile Barrier Technique was utilized including caps, mask, sterile gowns, sterile gloves, sterile drape, hand hygiene and skin antiseptic. A timeout was performed prior to the initiation of the procedure. The posterior right thigh was prepped and draped in a sterile fashion. 1% lidocaine was utilized for local anesthesia. Under sonographic guidance, a 6 French Safe-T-Centesis catheter was advanced into the superficial fluid collection. 200 cc dark fluid was aspirated. FINDINGS: Images demonstrate catheter placement in the posterior right thigh fluid collection. Post aspiration images demonstrate partial resolution of the fluid collection. IMPRESSION: Successful aspiration of a right posterior thigh fluid collection. Electronically Signed   By: Jolaine Click M.D.   On: 12/17/2017 11:23    Lab Data:  CBC: Recent Labs  Lab 12/15/17 1519 12/16/17 0628 12/17/17 0455  WBC 11.0* 8.0 9.0  NEUTROABS 9.5*  --   --   HGB 13.0 11.0* 10.9*  HCT 38.9* 33.4* 33.0*  MCV 97.5 99.7 98.8  PLT 135* 112* 138*   Basic  Metabolic Panel: Recent Labs  Lab 12/15/17 1519 12/16/17 0628 12/17/17 0455  NA 138 138 138  K 3.2* 3.5 4.1  CL 104 109 110  CO2 24 23 25   GLUCOSE 158* 86 99  BUN 16 12 9   CREATININE 0.90 0.79 0.69  CALCIUM 8.8* 8.0* 8.1*   GFR: Estimated Creatinine Clearance: 69.9 mL/min (by C-G formula based on SCr of 0.69 mg/dL). Liver Function Tests: Recent Labs  Lab 12/15/17 1519 12/16/17 0628  AST 28 21  ALT 18 15  ALKPHOS 54 43  BILITOT 2.1* 2.1*  PROT 5.8* 4.9*  ALBUMIN 3.1* 2.5*   No results for input(s): LIPASE, AMYLASE in the last 168 hours. No results for input(s): AMMONIA in the last 168 hours. Coagulation Profile: Recent Labs  Lab 12/17/17 0455  INR 1.20   Cardiac Enzymes: No results for input(s): CKTOTAL, CKMB, CKMBINDEX, TROPONINI in the last 168 hours. BNP (last 3 results) No results for input(s): PROBNP in the last 8760 hours. HbA1C: No results for input(s): HGBA1C in the last 72 hours. CBG: No results for input(s): GLUCAP in the last 168 hours. Lipid Profile: No results for input(s): CHOL, HDL, LDLCALC, TRIG, CHOLHDL, LDLDIRECT in the last 72 hours. Thyroid Function Tests: No results for input(s): TSH, T4TOTAL, FREET4, T3FREE, THYROIDAB in the last 72 hours. Anemia Panel: No results for input(s): VITAMINB12, FOLATE, FERRITIN, TIBC, IRON, RETICCTPCT in the last 72 hours. Urine analysis:    Component Value Date/Time   COLORURINE YELLOW 12/15/2017 1952   APPEARANCEUR CLEAR 12/15/2017 1952   LABSPEC 1.009 12/15/2017 1952   PHURINE 6.0 12/15/2017 1952   GLUCOSEU NEGATIVE 12/15/2017 1952   HGBUR SMALL (A) 12/15/2017 1952   BILIRUBINUR NEGATIVE 12/15/2017 1952   KETONESUR NEGATIVE 12/15/2017 1952   PROTEINUR NEGATIVE 12/15/2017 1952   NITRITE NEGATIVE 12/15/2017 1952   LEUKOCYTESUR NEGATIVE 12/15/2017 1952     Ripudeep Rai M.D. [facility] 12/17/2017, 12:48 PM  Pager: [phone] Between 7am to 7pm - call Pager - [phone]  After  7pm go to  [email or web address] - password [email or web address]  Call night coverage person covering after 7pm

## 2017-12-18 DIAGNOSIS — M25551 Pain in right hip: Secondary | ICD-10-CM

## 2017-12-18 LAB — BASIC METABOLIC PANEL
Anion gap: 4 — ABNORMAL LOW (ref 5–15)
BUN: 10 mg/dL (ref 8–23)
CHLORIDE: 108 mmol/L (ref 98–111)
CO2: 23 mmol/L (ref 22–32)
CREATININE: 0.78 mg/dL (ref 0.61–1.24)
Calcium: 7.9 mg/dL — ABNORMAL LOW (ref 8.9–10.3)
Glucose, Bld: 110 mg/dL — ABNORMAL HIGH (ref 70–99)
POTASSIUM: 3.6 mmol/L (ref 3.5–5.1)
SODIUM: 135 mmol/L (ref 135–145)

## 2017-12-18 LAB — CBC
HCT: 34.3 % — ABNORMAL LOW (ref 39.0–52.0)
Hemoglobin: 11.4 g/dL — ABNORMAL LOW (ref 13.0–17.0)
MCH: 32.5 pg (ref 26.0–34.0)
MCHC: 33.2 g/dL (ref 30.0–36.0)
MCV: 97.7 fL (ref 78.0–100.0)
PLATELETS: 150 10*3/uL (ref 150–400)
RBC: 3.51 MIL/uL — ABNORMAL LOW (ref 4.22–5.81)
RDW: 13.1 % (ref 11.5–15.5)
WBC: 7.7 10*3/uL (ref 4.0–10.5)

## 2017-12-18 MED ORDER — DOXYCYCLINE HYCLATE 100 MG PO CAPS: 100.0000 mg | ORAL_CAPSULE | Freq: Two times a day (BID) | ORAL | 0 refills | Status: AC

## 2017-12-18 MED ORDER — OXYCODONE-ACETAMINOPHEN 5-325 MG PO TABS: 1.0000 | ORAL_TABLET | Freq: Three times a day (TID) | ORAL | 0 refills | Status: AC | PRN

## 2017-12-18 NOTE — Discharge Summary (Addendum)
Physician Discharge Summary   Patient ID: Travis Wolfe MRN: [id number] DOB/AGE: 02/12/1935 83 y.o.  Admit date: 12/15/2017 Discharge date: 12/18/2017  Primary Care Physician:  [name], [name], MD   Recommendations for Outpatient Follow-up:  1. Follow up with PCP in 1-2 weeks 2. Follow-up outpatient with Dr. Charlann Boxerlin in 2 weeks 3. Eliquis currently on hold due to hematoma in the right thigh.  Patient recommended strongly to follow-up with his cardiologist, Dr Jeanne IvanAkbari  in [facility] regarding anticoagulation.  Home Health: None Equipment/Devices: None Discharge Condition: stable  CODE STATUS: FULL  Diet recommendation: Heart healthy diet  Discharge Diagnoses:    . Cellulitis of right leg Hematoma right thigh   . PAF (paroxysmal atrial fibrillation) (HCC) History of BPH Normocytic anemia   Consults: Orthopedics, Dr Charlann Boxerlin     Allergies:  No Known Allergies   DISCHARGE MEDICATIONS: Allergies as of 12/18/2017   No Known Allergies     Medication List    STOP taking these medications   ELIQUIS 5 MG Tabs tablet Generic drug:  apixaban     TAKE these medications   atorvastatin 40 MG tablet Commonly known as:  LIPITOR Take 40 mg by mouth daily.   COSOPT PF 22.3-6.8 MG/ML Soln ophthalmic solution Generic drug:  dorzolamidel-timolol Place 1 drop into both eyes every morning.   doxycycline 100 MG capsule Commonly known as:  VIBRAMYCIN Take 1 capsule (100 mg total) by mouth 2 (two) times daily for 10 days.   finasteride 5 MG tablet Commonly known as:  PROSCAR Take 5 mg by mouth daily.   flecainide 50 MG tablet Commonly known as:  TAMBOCOR Take 50 mg by mouth 2 (two) times daily.   iron polysaccharides 150 MG capsule Commonly known as:  NIFEREX Take 150 mg by mouth daily.   LUMIGAN 0.01 % Soln Generic drug:  bimatoprost   LYCOPENE PO Take 1 tablet by mouth daily.   MAGNESIUM CARBONATE PO Take 1 tablet by mouth daily.   multivitamin-lutein Caps  capsule Take 1 capsule by mouth daily.   NEXIUM 40 MG capsule Generic drug:  esomeprazole Take 40 mg by mouth daily.   oxyCODONE-acetaminophen 5-325 MG tablet Commonly known as:  PERCOCET/ROXICET Take 1 tablet by mouth every 8 (eight) hours as needed for up to 5 days for moderate pain or severe pain.   ZINC ACETATE PO Take 1 tablet by mouth daily.        Brief H and P: For complete details please refer to admission H and P, but in brief 83 year old Caucasian male with a past medical history of BPH, paroxysmal atrial fibrillation on anticoagulation with apixaban, onFlecainide,essential hypertension, peripheral vascular disease previous history of right hip replacement with revision surgery, with development of fluid collections requiring aspiration. He underwent aspiration by IR at [facility] on June 26 from the right thigh. Subsequently developed worsening pain swelling redness and fever.  Presented to ED for further work-up and was hospitalized with cellulitis.  Hospital Course:  Right thigh cellulitis with complex fluid collection, hematoma -Orthopedics was consulted and seen by Dr. Charlann Boxerlin. 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  New large fluid collection extending down the posterior compartment of the thigh down to just above the level of the knee, continuation of metallosis versus large hematoma, abscess cannot be excluded -Eliquis was held, IR was consulted -Patient was placed on IV  vancomycin and Zosyn, transitioned to oral doxycycline for 10 days at discharge.   - Blood cultures remain negative. -Per Dr. Bonnielee Haff' note, right thigh hematoma aspiration with 200 cc of dark fluid.   Discussed in detail with the patient regarding right thigh hematoma and to hold Eliquis until he discusses the anticoagulation with his cardiologist.   Mechanical falls -No evidence of syncopal episode, PT OT evaluation was ordered, however patient reports that he has been ambulating  Paroxysmal atrial fibrillation -Followed by cardiologist, Dr Jeanne Ivan in [facility], on diltiazem, flecainide and apixaban -Apixaban was placed on hold, hematoma was aspirated -Continue to hold apixaban and discuss further evaluation with his cardiologist  History of BPH Currently stable, continue Flomax, finasteride   Normocytic anemia H&H at baseline 11.4     Day of Discharge S: Feeling a lot better, wants to go home for his 30-year wedding anniversary today.  Denies any specific complaints, feels better after aspiration of the right thigh yesterday.  BP 115/61 (BP Location: Left Arm)   Pulse 74   Temp 98.2 F (36.8 C) (Oral)   Resp 18   Ht 5\' 8"  (1.727 m)   Wt 67.1 kg (148 lb)   SpO2 96%   BMI 22.50 kg/m   Physical Exam: General: Alert and awake oriented x3 not in any acute distress. HEENT: anicteric sclera, pupils reactive to light and accommodation CVS: S1-S2 clear no murmur rubs or gallops Chest: clear to auscultation bilaterally, no wheezing rales or rhonchi Abdomen: soft nontender, nondistended, normal bowel sounds Extremities: no cyanosis, clubbing.  Right hip swelling improving, no erythema, warmth Neuro: no new deficits   The results of significant diagnostics from this hospitalization (including imaging, microbiology, ancillary and laboratory) are listed below for reference.      Procedures/Studies:  Dg Chest 2 View  Result Date: 12/15/2017 CLINICAL DATA:  Fever. EXAM:  CHEST - 2 VIEW COMPARISON:  Chest radiographs and chest CTA 10/14/2017 FINDINGS: The cardiac silhouette is borderline enlarged. No airspace consolidation, edema, pleural effusion, or pneumothorax is identified. Old T7 and T8 compression fractures are again noted with prior augmentation of the former. IMPRESSION: No active cardiopulmonary disease. Electronically Signed   By: Sebastian Ache M.D.   On: 12/15/2017 16:24   Mr Femur Right W Wo Contrast  Result Date: 12/16/2017 CLINICAL DATA:  Right thigh pain and swelling. EXAM: MRI OF THE RIGHT FEMUR WITHOUT AND WITH CONTRAST TECHNIQUE: Multiplanar, multisequence MR imaging of the right thigh was performed both before and after administration of intravenous contrast. CONTRAST:  14mL MULTIHANCE GADOBENATE DIMEGLUMINE 529 MG/ML IV SOLN COMPARISON:  MRI 01/09/2017 FINDINGS: Long stem redo right total hip arthroplasty appears stable. No findings for periprosthetic fracture or obvious changes of osteomyelitis. Stable large complex joint effusion and periarticular fluid most consistent with metallosis. Persistent layering inflammatory debris within the complex fluid. There is also a stable more solid appearing masslike component posteriorly involving the ischial tuberosity. There is a new large complex fluid collection extending down the posterior compartment of the thigh which demonstrates a fluid fluid level and rim-like enhancement. This has increased T1 signal intensity. It could be a hematoma or progression of the  metal low cyst down into the thigh. No obvious gas is demonstrated to suggest an abscess. No surrounding inflammatory changes in the hamstring muscles. There is mild edema like signal abnormality in the superficial aspect of the vastus lateralis muscle. This fluid collection is 30 cm long and 7 cm wide. IMPRESSION: 1. 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 2. 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 3. New large fluid collection extending down the posterior compartment of the thigh down to just above the level of the knee. This could be a continuation of the metallosis process or more likely a large hematoma with fluid fluid level. Abscess cannot be totally excluded but appears less likely. Electronically Signed   By: Rudie Meyer M.D.   On: 12/16/2017 08:26   Ir US Guide Bx Asp/drain  Result Date: 12/17/2017 INDICATION: Right thigh fluid collection EXAM: ULTRASOUND-GUIDED ASPIRATION MEDICATIONS: The patient is currently admitted to the hospital and receiving intravenous antibiotics. The antibiotics were administered within an appropriate time frame prior to the initiation of the procedure. ANESTHESIA/SEDATION: Fentanyl 100 mcg IV; Versed 2 mg IV Moderate Sedation Time:  10 minutes The patient was continuously monitored during the procedure by the interventional radiology nurse under my direct supervision. COMPLICATIONS: None immediate. PROCEDURE: Informed written consent was obtained from the patient after a thorough discussion of the procedural risks, benefits and alternatives. All questions were addressed. Maximal Sterile Barrier Technique was utilized including caps, mask, sterile gowns, sterile gloves, sterile drape, hand hygiene and skin antiseptic. A timeout was performed prior to the initiation of the procedure. The posterior right thigh was prepped and draped in a sterile fashion. 1% lidocaine was utilized for local anesthesia. Under sonographic guidance, a 6 French Safe-T-Centesis catheter was advanced into the superficial fluid collection. 200 cc dark fluid was aspirated. FINDINGS: Images demonstrate catheter placement in the posterior right thigh fluid collection. Post aspiration images demonstrate partial resolution of the fluid collection. IMPRESSION: Successful aspiration  of a right posterior thigh fluid collection. Electronically Signed   By: Jolaine Click M.D.   On: 12/17/2017 11:23       LAB RESULTS: Basic Metabolic Panel: Recent Labs  Lab 12/17/17 0455 12/18/17 0433  NA 138 135  K 4.1 3.6  CL 110 108  CO2 25 23  GLUCOSE 99 110*  BUN 9 10  CREATININE 0.69 0.78  CALCIUM 8.1* 7.9*   Liver Function Tests: Recent Labs  Lab 12/15/17 1519 12/16/17 0628  AST 28 21  ALT 18 15  ALKPHOS 54 43  BILITOT 2.1* 2.1*  PROT 5.8* 4.9*  ALBUMIN 3.1* 2.5*   No results for input(s): LIPASE, AMYLASE in the last 168 hours. No results for input(s): AMMONIA in the last 168 hours. CBC: Recent Labs  Lab 12/15/17 1519  12/17/17 0455 12/18/17 0433  WBC 11.0*   < > 9.0 7.7  NEUTROABS 9.5*  --   --   --   HGB 13.0   < > 10.9* 11.4*  HCT 38.9*   < > 33.0* 34.3*  MCV 97.5   < > 98.8 97.7  PLT 135*   < > 138* 150   < > = values in this interval not displayed.   Cardiac Enzymes: No results for input(s): CKTOTAL, CKMB, CKMBINDEX, TROPONINI in the last 168 hours. BNP: Invalid input(s): POCBNP CBG: No results for input(s): GLUCAP in the last  168 hours.    Disposition and Follow-up: Discharge Instructions    Diet - low sodium heart healthy   Complete by:  As directed    Increase activity slowly   Complete by:  As directed        DISPOSITION: Home   DISCHARGE FOLLOW-UP Follow-up Information    Durene Romans, MD. Schedule an appointment as soon as possible for a visit in 2 week(s).   Specialty:  Orthopedic Surgery Contact information: [address] [place] [address] [phone]        Elijio Miles., MD. Schedule an appointment as soon as possible for a visit in 2 week(s).   Specialty:  Family Medicine Contact information: [address] [place] [address] [phone]            Time coordinating discharge:  35 minutes  Signed:   Thad Ranger M.D. [facility] 12/18/2017, 11:37  AM Pager: [phone]    Coding query addendum Right thigh cellulitis with fluid collection likely hematoma POA  Sepsis ruled out Normocytic normochromic anemia, otherwise no other clinical determination    Shanay Woolman M.D. [facility] 12/31/2017, 6:57 AM  Pager: [phone]

## 2017-12-18 NOTE — Evaluation (Signed)
Physical Therapy Evaluation Patient Details Name: Travis Wolfe MRN: [id number] DOB: 08/17/1934 Today's Date: 12/18/2017   History of Present Illness  83 year old Caucasian male with a past medical history of BPH, paroxysmal atrial fibrillation on anticoagulation with apixaban, on Flecainide, essential hypertension, peripheral vascular disease previous history of right hip replacement with revision surgery, with development of fluid collections requiring aspiration.  He underwent aspiration by IR at [facility] on June 26 from the right thigh.  Subsequently developed worsening pain swelling redness and fever.  Presented to ED for further work-up and was hospitalized with cellulitis. Hematoma aspirated 12/17/17  Clinical Impression  Pt admitted with above diagnosis. Pt currently with functional limitations due to the deficits listed below (see PT Problem List). PTA pt independent in mobility and all ADLs. Pt currently requires minA for sit>stand with RW, min A for ambulation of 40 feet with RW and modA for ascent/descent of 1 stair. Pt will benefit from skilled PT to increase their independence and safety with mobility to allow discharge to the venue listed below.       Follow Up Recommendations Home health PT;Supervision for mobility/OOB    Equipment Recommendations  None recommended by PT       Precautions / Restrictions Precautions Precautions: Fall Restrictions Weight Bearing Restrictions: No      Mobility  Bed Mobility Overal bed mobility: Modified Independent                Transfers Overall transfer level: Needs assistance Equipment used: Rolling walker (2 wheeled) Transfers: Sit to/from Stand Sit to Stand: Min assist         General transfer comment: minA for power up with sit>stand   Ambulation/Gait Ambulation/Gait assistance: Min assist;Min guard Gait Distance (Feet): 40 Feet(2 x 20') Assistive device: Rolling walker (2 wheeled) Gait Pattern/deviations:  Step-to pattern;Decreased weight shift to right;Decreased stance time - right;Decreased step length - left;Antalgic Gait velocity: slowed Gait velocity interpretation: <1.8 ft/sec, indicate of risk for recurrent falls General Gait Details: minA for steadying with inital gait progressing to hands on min guard, decrease weight shift to R and increased UE support with R LE weightbearing Pt denies pain in R LE   Stairs Stairs: Yes Stairs assistance: Mod assist Stair Management: One rail Left Number of Stairs: 1 General stair comments: 2x up one step with mod UE support on R UE, pt utilized handrail on R, pt reports that there will be 2 people to assist getting in his house at d/c.      Balance Overall balance assessment: Needs assistance Sitting-balance support: No upper extremity supported;Feet supported Sitting balance-Leahy Scale: Good     Standing balance support: Bilateral upper extremity supported;Single extremity supported Standing balance-Leahy Scale: Poor Standing balance comment: requires UE support                             Pertinent Vitals/Pain Pain Assessment: No/denies pain    Home Living Family/patient expects to be discharged to:: Private residence Living Arrangements: Spouse/significant other Available Help at Discharge: Family;Available 24 hours/day Type of Home: House Home Access: Stairs to enter Entrance Stairs-Rails: None Entrance Stairs-Number of Steps: 2 Home Layout: Multi-level;Able to live on main level with bedroom/bathroom Home Equipment: Walker - 2 wheels;Grab bars - tub/shower;Shower seat      Prior Function Level of Independence: Independent                  Extremity/Trunk Assessment  Upper Extremity Assessment Upper Extremity Assessment: Overall WFL for tasks assessed    Lower Extremity Assessment Lower Extremity Assessment: RLE deficits/detail RLE Deficits / Details: lacking full R hip ROM pain with flexion past 90  degrees, knee and ankle strength grossly 4/5  RLE: Unable to fully assess due to pain RLE Sensation: WNL RLE Coordination: WNL       Communication   Communication: No difficulties  Cognition Arousal/Alertness: Awake/alert Behavior During Therapy: WFL for tasks assessed/performed Overall Cognitive Status: Within Functional Limits for tasks assessed                                        General Comments General comments (skin integrity, edema, etc.): Wife present during evaluation, VSS throughout        Assessment/Plan    PT Assessment Patient needs continued PT services  PT Problem List Decreased strength;Decreased range of motion;Decreased activity tolerance;Decreased balance;Decreased mobility;Decreased safety awareness       PT Treatment Interventions DME instruction;Gait training;Stair training;Functional mobility training;Therapeutic activities;Therapeutic exercise;Balance training;Cognitive remediation;Patient/family education    PT Goals (Current goals can be found in the Care Plan section)  Acute Rehab PT Goals Patient Stated Goal: get stronger so he can walk independently PT Goal Formulation: With patient/family Time For Goal Achievement: 01/01/18 Potential to Achieve Goals: Good    Frequency Min 3X/week    AM-PAC PT "6 Clicks" Daily Activity  Outcome Measure Difficulty turning over in bed (including adjusting bedclothes, sheets and blankets)?: None Difficulty moving from lying on back to sitting on the side of the bed? : A Little Difficulty sitting down on and standing up from a chair with arms (e.g., wheelchair, bedside commode, etc,.)?: Unable Help needed moving to and from a bed to chair (including a wheelchair)?: A Little Help needed walking in hospital room?: A Little Help needed climbing 3-5 steps with a railing? : A Lot 6 Click Score: 16    End of Session Equipment Utilized During Treatment: Gait belt Activity Tolerance: Patient  limited by fatigue Patient left: in chair;with call bell/phone within reach;with chair alarm set;with family/visitor present Nurse Communication: Mobility status PT Visit Diagnosis: Unsteadiness on feet (R26.81);Other abnormalities of gait and mobility (R26.89);Muscle weakness (generalized) (M62.81);Repeated falls (R29.6);Difficulty in walking, not elsewhere classified (R26.2)    Time: 1115-1150 PT Time Calculation (min) (ACUTE ONLY): 35 min   Charges:   PT Evaluation $PT Eval Moderate Complexity: 1 Mod PT Treatments $Gait Training: 8-22 mins   PT G Codes:       Kristinia Leavy B. Beverely RisenVan Fleet PT, DPT Acute Rehabilitation  [phone] Pager [phone]    Elon Alaslizabeth B Van Fleet 12/18/2017, 12:19 PM

## 2017-12-18 NOTE — Progress Notes (Signed)
Travis Wolfe to be D/C'd Home per MD order.  Discussed with the patient and all questions fully answered.  VSS, Skin clean, dry and intact without evidence of skin break down, no evidence of skin tears noted. IV catheter discontinued intact. Site without signs and symptoms of complications. Dressing and pressure applied.  An After Visit Summary was printed and given to the patient. Patient received prescription.  D/c education completed with patient/family including follow up instructions, medication list, d/c activities limitations if indicated, with other d/c instructions as indicated by MD - patient able to verbalize understanding, all questions fully answered.   Patient instructed to return to ED, call 911, or call MD for any changes in condition.   Patient escorted via WC, and D/C home via private auto.  Travis Wolfe 12/18/2017 1:58 PM

## 2017-12-18 NOTE — Progress Notes (Signed)
Patient ID: Erma HeritageRobert L Chicoine, male   DOB: July 20, 1934, 83 y.o.   MRN: [id number]  Right hip, posterior thigh swelling status post IR aspiration Feeling better Eager to get home on his 30th wedding anniversary   Right hip anterior swelling no erythema Right distal thigh no erythema, no warmth  Plan: Discharge to home today Plan to have him follow up with me in 2 weeks to discuss further long term management of his right hip condition PO antibiotics per medical team

## 2017-12-18 NOTE — Care Management Note (Addendum)
Case Management Note  Patient Details  Name: Travis Wolfe MRN: [id number] Date of Birth: 03/08/1935  Subjective/Objective:               Cellulitis of R leg             - s/p Hematoma aspirated 12/17/17  PCP: Daneil DolinJohn Weaver  Action/Plan: Transition to home with wife, home health services to follow. Wife to provide transportation to home.  Expected Discharge Date:  12/18/17               Expected Discharge Plan:  Home w Home Health Services  In-House Referral:     Discharge planning Services  CM Consult  Post Acute Care Choice:    Choice offered to:  Patient  DME Arranged:   N/A DME Agency:   N/A  HH Arranged:  PT HH Agency:  [facility]  Status of Service:  Completed, signed off  If discussed at Long Length of Stay Meetings, dates discussed:    Additional Comments:  Travis Wolfe, Travis Nghiem Hudson, RN 12/18/2017, 1:31 PM

## 2017-12-20 LAB — CULTURE, BLOOD (ROUTINE X 2)
Culture: NO GROWTH
Culture: NO GROWTH
SPECIAL REQUESTS: ADEQUATE
Special Requests: ADEQUATE

## 2017-12-22 LAB — AEROBIC/ANAEROBIC CULTURE W GRAM STAIN (SURGICAL/DEEP WOUND): Culture: NO GROWTH

## 2017-12-22 LAB — AEROBIC/ANAEROBIC CULTURE (SURGICAL/DEEP WOUND)

## 2018-01-20 ENCOUNTER — Encounter (HOSPITAL_COMMUNITY): Payer: Self-pay

## 2018-01-20 NOTE — Patient Instructions (Addendum)
Travis Wolfe  01/20/2018   Your procedure is scheduled on: 01-26-18   Report to [facility] Main  Entrance    Report to admitting at 2:15PM    Call this number if you have problems the morning of surgery [phone]     Remember: Do not eat food :After Midnight. YOU MAY HAVE CLEAR LIQUIDS FROM MIDNIGHT UNTIL 10:45AM. NOTHING BY MOUTH AFTER 10:45AM!     Take these medicines the morning of surgery with A SIP OF WATER: FLECAINIDE, FINASTERIDE, ATORVASTATIN, NEXIUM, EYES DROPS                                You may not have any metal on your body including hair pins and              piercings  Do not wear jewelry, make-up, lotions, powders or perfumes, deodorant                    Men may shave face and neck.   Do not bring valuables to the hospital. [facility] IS NOT             RESPONSIBLE   FOR VALUABLES.  Contacts, dentures or bridgework may not be worn into surgery.  Leave suitcase in the car. After surgery it may be brought to your room.                 Please read over the following fact sheets you were given: _____________________________________________________________________     CLEAR LIQUID DIET   Foods Allowed                                                                     Foods Excluded  Coffee and tea, regular and decaf                             liquids that you cannot  Plain Jell-O in any flavor                                             see through such as: Fruit ices (not with fruit pulp)                                     milk, soups, orange juice  Iced Popsicles                                    All solid food Carbonated beverages, regular and diet                                    Cranberry, grape and apple juices Sports drinks like Gatorade Lightly seasoned clear broth or consume(fat  free) Sugar, honey syrup  Sample Menu Breakfast                                Lunch                                      Supper Cranberry juice                    Beef broth                            Chicken broth Jell-O                                     Grape juice                           Apple juice Coffee or tea                        Jell-O                                      Popsicle                                                Coffee or tea                        Coffee or tea  _____________________________________________________________________             [facility] - Preparing for Surgery Before surgery, you can play an important role.  Because skin is not sterile, your skin needs to be as free of germs as possible.  You can reduce the number of germs on your skin by washing with CHG (chlorahexidine gluconate) soap before surgery.  CHG is an antiseptic cleaner which kills germs and bonds with the skin to continue killing germs even after washing. Please DO NOT use if you have an allergy to CHG or antibacterial soaps.  If your skin becomes reddened/irritated stop using the CHG and inform your nurse when you arrive at Short Stay. Do not shave (including legs and underarms) for at least 48 hours prior to the first CHG shower.  You may shave your face/neck. Please follow these instructions carefully:  1.  Shower with CHG Soap the night before surgery and the  morning of Surgery.  2.  If you choose to wash your hair, wash your hair first as usual with your  normal  shampoo.  3.  After you shampoo, rinse your hair and body thoroughly to remove the  shampoo.                           4.  Use CHG as you would any other liquid soap.  You can apply chg directly  to the skin and wash  Gently with a scrungie or clean washcloth.  5.  Apply the CHG Soap to your body ONLY FROM THE NECK DOWN.   Do not use on face/ open                           Wound or open sores. Avoid contact with eyes, ears mouth and genitals (private parts).                       Wash face,  Genitals (private parts)  with your normal soap.             6.  Wash thoroughly, paying special attention to the area where your surgery  will be performed.  7.  Thoroughly rinse your body with warm water from the neck down.  8.  DO NOT shower/wash with your normal soap after using and rinsing off  the CHG Soap.                9.  Pat yourself dry with a clean towel.            10.  Wear clean pajamas.            11.  Place clean sheets on your bed the night of your first shower and do not  sleep with pets. Day of Surgery : Do not apply any lotions/deodorants the morning of surgery.  Please wear clean clothes to the hospital/surgery center.  FAILURE TO FOLLOW THESE INSTRUCTIONS MAY RESULT IN THE CANCELLATION OF YOUR SURGERY PATIENT SIGNATURE_________________________________  NURSE SIGNATURE__________________________________  ________________________________________________________________________   Travis Wolfe  An incentive spirometer is a tool that can help keep your lungs clear and active. This tool measures how well you are filling your lungs with each breath. Taking long deep breaths may help reverse or decrease the chance of developing breathing (pulmonary) problems (especially infection) following:  A long period of time when you are unable to move or be active. BEFORE THE PROCEDURE   If the spirometer includes an indicator to show your best effort, your nurse or respiratory therapist will set it to a desired goal.  If possible, sit up straight or lean slightly forward. Try not to slouch.  Hold the incentive spirometer in an upright position. INSTRUCTIONS FOR USE  1. Sit on the edge of your bed if possible, or sit up as far as you can in bed or on a chair. 2. Hold the incentive spirometer in an upright position. 3. Breathe out normally. 4. Place the mouthpiece in your mouth and seal your lips tightly around it. 5. Breathe in slowly and as deeply as possible, raising the piston or the ball  toward the top of the column. 6. Hold your breath for 3-5 seconds or for as long as possible. Allow the piston or ball to fall to the bottom of the column. 7. Remove the mouthpiece from your mouth and breathe out normally. 8. Rest for a few seconds and repeat Steps 1 through 7 at least 10 times every 1-2 hours when you are awake. Take your time and take a few normal breaths between deep breaths. 9. The spirometer may include an indicator to show your best effort. Use the indicator as a goal to work toward during each repetition. 10. After each set of 10 deep breaths, practice coughing to be sure your lungs are clear. If you have an incision (the cut made at the time of  surgery), support your incision when coughing by placing a pillow or rolled up towels firmly against it. Once you are able to get out of bed, walk around indoors and cough well. You may stop using the incentive spirometer when instructed by your caregiver.  RISKS AND COMPLICATIONS  Take your time so you do not get dizzy or light-headed.  If you are in pain, you may need to take or ask for pain medication before doing incentive spirometry. It is harder to take a deep breath if you are having pain. AFTER USE  Rest and breathe slowly and easily.  It can be helpful to keep track of a log of your progress. Your caregiver can provide you with a simple table to help with this. If you are using the spirometer at home, follow these instructions: [place] IF:   You are having difficultly using the spirometer.  You have trouble using the spirometer as often as instructed.  Your pain medication is not giving enough relief while using the spirometer.  You develop fever of 100.5 F (38.1 C) or higher. SEEK IMMEDIATE MEDICAL CARE IF:   You cough up bloody sputum that had not been present before.  You develop fever of 102 F (38.9 C) or greater.  You develop worsening pain at or near the incision site. MAKE SURE YOU:    Understand these instructions.  Will watch your condition.  Will get help right away if you are not doing well or get worse. Document Released: 10/14/2006 Document Revised: 08/26/2011 Document Reviewed: 12/15/2006 Exit[employer] Patient Information 2014 [employer], [place].   ________________________________________________________________________  WHAT IS A BLOOD TRANSFUSION? Blood Transfusion Information  A transfusion is the replacement of blood or some of its parts. Blood is made up of multiple cells which provide different functions.  Red blood cells carry oxygen and are used for blood loss replacement.  White blood cells fight against infection.  Platelets control bleeding.  Plasma helps clot blood.  Other blood products are available for specialized needs, such as hemophilia or other clotting disorders. BEFORE THE TRANSFUSION  Who gives blood for transfusions?   Healthy volunteers who are fully evaluated to make sure their blood is safe. This is blood bank blood. Transfusion therapy is the safest it has ever been in the practice of medicine. Before blood is taken from a donor, a complete history is taken to make sure that person has no history of diseases nor engages in risky social behavior (examples are intravenous drug use or sexual activity with multiple partners). The donor's travel history is screened to minimize risk of transmitting infections, such as malaria. The donated blood is tested for signs of infectious diseases, such as HIV and hepatitis. The blood is then tested to be sure it is compatible with you in order to minimize the chance of a transfusion reaction. If you or a relative donates blood, this is often done in anticipation of surgery and is not appropriate for emergency situations. It takes many days to process the donated blood. RISKS AND COMPLICATIONS Although transfusion therapy is very safe and saves many lives, the main dangers of transfusion include:    Getting an infectious disease.  Developing a transfusion reaction. This is an allergic reaction to something in the blood you were given. Every precaution is taken to prevent this. The decision to have a blood transfusion has been considered carefully by your caregiver before blood is given. Blood is not given unless the benefits outweigh the risks. AFTER THE  TRANSFUSION  Right after receiving a blood transfusion, you will usually feel much better and more energetic. This is especially true if your red blood cells have gotten low (anemic). The transfusion raises the level of the red blood cells which carry oxygen, and this usually causes an energy increase.  The nurse administering the transfusion will monitor you carefully for complications. HOME CARE INSTRUCTIONS  No special instructions are needed after a transfusion. You may find your energy is better. Speak with your caregiver about any limitations on activity for underlying diseases you may have. SEEK MEDICAL CARE IF:   Your condition is not improving after your transfusion.  You develop redness or irritation at the intravenous (IV) site. SEEK IMMEDIATE MEDICAL CARE IF:  Any of the following symptoms occur over the next 12 hours:  Shaking chills.  You have a temperature by mouth above 102 F (38.9 C), not controlled by medicine.  Chest, back, or muscle pain.  People around you feel you are not acting correctly or are confused.  Shortness of breath or difficulty breathing.  Dizziness and fainting.  You get a rash or develop hives.  You have a decrease in urine output.  Your urine turns a dark color or changes to pink, red, or brown. Any of the following symptoms occur over the next 10 days:  You have a temperature by mouth above 102 F (38.9 C), not controlled by medicine.  Shortness of breath.  Weakness after normal activity.  The white part of the eye turns yellow (jaundice).  You have a decrease in the  amount of urine or are urinating less often.  Your urine turns a dark color or changes to pink, red, or brown. Document Released: 05/31/2000 Document Revised: 08/26/2011 Document Reviewed: 01/18/2008 [facility] Patient Information 2014 [place], [place].  _______________________________________________________________________

## 2018-01-20 NOTE — Progress Notes (Signed)
EKG , CXR  12-15-17 Epic    [facility] CARDIOLOGY DR. Rudolpho SevinAKBARY 12-02-17 Epic   ECHO 10-14-17 Epic CARE EVERYWHERE

## 2018-01-21 ENCOUNTER — Encounter (HOSPITAL_COMMUNITY): Payer: Self-pay

## 2018-01-21 ENCOUNTER — Encounter (HOSPITAL_COMMUNITY)
Admission: RE | Admit: 2018-01-21 | Discharge: 2018-01-21 | Disposition: A | Discharge status: Home / Self Care | Payer: Medicare Other | Source: Ambulatory Visit | Attending: Orthopedic Surgery | Admitting: Orthopedic Surgery

## 2018-01-21 ENCOUNTER — Other Ambulatory Visit: Payer: Self-pay

## 2018-01-21 DIAGNOSIS — R9431 Abnormal electrocardiogram [ECG] [EKG]: Secondary | ICD-10-CM | POA: Insufficient documentation

## 2018-01-21 DIAGNOSIS — Z01812 Encounter for preprocedural laboratory examination: Secondary | ICD-10-CM | POA: Diagnosis present

## 2018-01-21 DIAGNOSIS — I4891 Unspecified atrial fibrillation: Secondary | ICD-10-CM | POA: Diagnosis not present

## 2018-01-21 DIAGNOSIS — Z01810 Encounter for preprocedural cardiovascular examination: Secondary | ICD-10-CM | POA: Insufficient documentation

## 2018-01-21 HISTORY — DX: Cardiac murmur, unspecified: R01.1

## 2018-01-21 HISTORY — DX: Benign prostatic hyperplasia without lower urinary tract symptoms: N40.0

## 2018-01-21 HISTORY — DX: Ventricular premature depolarization: I49.3

## 2018-01-21 HISTORY — DX: Unspecified fall, initial encounter: W19.XXXA

## 2018-01-21 HISTORY — DX: Unspecified atrial fibrillation: I48.91

## 2018-01-21 HISTORY — DX: Nonrheumatic mitral (valve) insufficiency: I34.0

## 2018-01-21 HISTORY — DX: Hyperlipidemia, unspecified: E78.5

## 2018-01-21 HISTORY — DX: Occlusion and stenosis of bilateral carotid arteries: I65.23

## 2018-01-21 LAB — ABO/RH: ABO/RH(D): A POS

## 2018-01-21 LAB — BASIC METABOLIC PANEL
Anion gap: 11 (ref 5–15)
BUN: 14 mg/dL (ref 8–23)
CALCIUM: 9.2 mg/dL (ref 8.9–10.3)
CHLORIDE: 106 mmol/L (ref 98–111)
CO2: 27 mmol/L (ref 22–32)
CREATININE: 0.69 mg/dL (ref 0.61–1.24)
Glucose, Bld: 121 mg/dL — ABNORMAL HIGH (ref 70–99)
Potassium: 3.6 mmol/L (ref 3.5–5.1)
SODIUM: 144 mmol/L (ref 135–145)

## 2018-01-21 LAB — CBC
HCT: 40.4 % (ref 39.0–52.0)
Hemoglobin: 13.2 g/dL (ref 13.0–17.0)
MCH: 31.5 pg (ref 26.0–34.0)
MCHC: 32.7 g/dL (ref 30.0–36.0)
MCV: 96.4 fL (ref 78.0–100.0)
PLATELETS: 273 10*3/uL (ref 150–400)
RBC: 4.19 MIL/uL — ABNORMAL LOW (ref 4.22–5.81)
RDW: 13.1 % (ref 11.5–15.5)
WBC: 9.4 10*3/uL (ref 4.0–10.5)

## 2018-01-21 LAB — SURGICAL PCR SCREEN
MRSA, PCR: NEGATIVE
Staphylococcus aureus: NEGATIVE

## 2018-01-25 NOTE — H&P (Signed)
Travis Wolfe is an 83 y.o. male.    Chief Complaint:   Infected right THA  Procedure:  I&D of right hip, excisional and non-excisional debridement and head and liner exchange  HPI: Pt is a 83 y.o. male complaining of right hip pain for ~2 years. Pain had continually increased since the beginning. Patient had the index THA and then 2 subsequent revision/I&D's per the patient.  2X-rays in the clinic show previous right THA. Pt has tried various conservative treatments which have failed to alleviate their symptoms, including NSAIDs / analgesic medications, activity modifications and use of assistance devices.  He does rate his pain as a 8 out of 10 on a 10 point scale.  States that he is not really effected at night, but pain increase with WB and activity. Various options are discussed with the patient. Risks, benefits and expectations were discussed with the patient. Patient understand the risks, benefits and expectations and wishes to proceed with surgery.    PCP: Elijio Miles., MD  D/C Plans:       Home   Post-op Meds:       No Rx given  Tranexamic Acid:      To be given - IV   Decadron:      Is to be given  FYI:     Eliquis (on pre-op)  Norco  DME:   Pt already has equipment  PT:    No PT    PMH: Past Medical History:  Diagnosis Date  . A-fib (HCC)    REPORTS WAS DUE TO DEHYDRATION 3 YEARS AGO BUT WAS GIVEN FLUIDS IN ED AND MEDS TO SLOW HR AND DROVE HIMSELF HOME  ; HAD ABLATION X2 WITH CARDIO DR. AKBARY AS PRECAUTION  ; has been on eliquis since but reports he has not taken it for 5 weeks b/c he was told to hold it for the multiple irrigations and debridements for the hip infection. reports his cardiologist is aware he has been holding it;   . Arthritis   . Atherosclerosis of both carotid arteries   . BPH (benign prostatic hyperplasia)   . Fall    fell on friday 01-16-18;   lost balAnce whIile tryng to get his sock on ;sustained skin tear to left wrist ; no drainage  ,  scabbed over , and has been covering with bandage   . GERD (gastroesophageal reflux disease)   . Heart murmur    SINCE HIS 20s  . HLD (hyperlipidemia)   . Hypertension   . Non-rheumatic mitral regurgitation   . PVC (premature ventricular contraction)     PSH: Past Surgical History:  Procedure Laterality Date  . BACK SURGERY  2007   L1-L5 LUMBAR WITH HARDWARE PLACED  . CARDIOVERSION    . CATARACT EXTRACTION, BILATERAL  2015  . CRYOABLATION     CARDIAC ABLATION   . DACROCYSTORHINOSTOMY  07/26/2011  . EYE SURGERY    . HERNIA REPAIR Bilateral   . hx of echocardiogram     . INGUINAL HERNIA REPAIR Bilateral    with mesh   . IR US GUIDE BX ASP/DRAIN  12/17/2017  . IRRIGATION AND ASPIRATION RIGHT HIP   12/10/2017   [facility]  x2, [place] x1   . KIDNEY STONE SURGERY     lithotripsy   . KYPHOPLASTY     T7  . LATERAL FUSION LUMBAR SPINE     L1-L5  . PULMONARY VEIN ISOLATION AND LEFT ATRIAL ROOFLINE ABLATION  10/15/2017   DR Rudolpho SevinAKBARY   . ROTATOR CUFF REPAIR Left   . SHOULDER DEBRIDEMENT Right   . SHOULDER OPEN ROTATOR CUFF REPAIR Left   . SHOULDER SURGERY  1988   arthroscopy    . TONSILLECTOMY    . TOTAL HIP ARTHROPLASTY     done twice  . TOTAL HIP ARTHROPLASTY  2010  . TOTAL HIP REVISION      05-2017, 10-2016    Social History:  reports that he has never smoked. He has never used smokeless tobacco. He reports that he drinks alcohol. He reports that he does not use drugs.  Allergies:  No Known Allergies  Medications: No current facility-administered medications for this encounter.    Current Outpatient Medications  Medication Sig Dispense Refill  . atorvastatin (LIPITOR) 40 MG tablet Take 40 mg by mouth daily.  3  . COSOPT PF 22.3-6.8 MG/ML SOLN ophthalmic solution Place 1 drop into both eyes every morning.  3  . finasteride (PROSCAR) 5 MG tablet Take 5 mg by mouth daily.    . flecainide (TAMBOCOR) 50 MG tablet Take 50 mg by mouth 2 (two) times daily.    . iron  polysaccharides (NIFEREX) 150 MG capsule Take 150 mg by mouth daily.    Marland Kitchen. LUMIGAN 0.01 % SOLN     . LYCOPENE PO Take 1 tablet by mouth daily.    Marland Kitchen. MAGNESIUM CARBONATE PO Take 1 tablet by mouth daily.    . multivitamin-lutein (OCUVITE-LUTEIN) CAPS capsule Take 1 capsule by mouth daily.    Marland Kitchen. NEXIUM 40 MG capsule Take 40 mg by mouth daily.     . Zinc Acetate, Oral, (ZINC ACETATE PO) Take 1 tablet by mouth daily.         Review of Systems  Constitutional: Positive for chills.  HENT: Negative.   Eyes: Negative.   Respiratory: Negative.   Cardiovascular: Negative.   Gastrointestinal: Positive for heartburn.  Genitourinary: Negative.   Musculoskeletal: Positive for joint pain.  Skin: Negative.   Neurological: Negative.   Endo/Heme/Allergies: Negative.   Psychiatric/Behavioral: Negative.        Physical Exam  Constitutional: He is oriented to person, place, and time. He appears well-developed.  HENT:  Head: Normocephalic.  Eyes: Pupils are equal, round, and reactive to light.  Neck: Neck supple. No JVD present. No tracheal deviation present. No thyromegaly present.  Cardiovascular: Normal rate, regular rhythm and intact distal pulses.  Murmur heard. Respiratory: Effort normal and breath sounds normal. No respiratory distress. He has no wheezes.  GI: Soft. There is no tenderness. There is no guarding.  Musculoskeletal:       Right hip: He exhibits decreased range of motion, decreased strength, tenderness, bony tenderness and laceration (healed previous incisions).  Lymphadenopathy:    He has no cervical adenopathy.  Neurological: He is alert and oriented to person, place, and time.  Skin: Skin is warm and dry.  Psychiatric: He has a normal mood and affect.       Assessment/Plan Assessment:     Infected right THA  Plan: Patient will undergo a I&D of right hip, excisional and non-excisional debridement and head and liner exchange on 01/26/2018 [facility]. Risks benefits and expectations were discussed with the patient. Patient understand risks, benefits and expectations and wishes to proceed.   Anastasio AuerbachMatthew S. Annisha Baar   PA-C  01/25/2018, 7:55 PM

## 2018-01-26 ENCOUNTER — Inpatient Hospital Stay: Payer: Self-pay

## 2018-01-26 ENCOUNTER — Encounter (HOSPITAL_COMMUNITY): Payer: Self-pay

## 2018-01-26 ENCOUNTER — Inpatient Hospital Stay (HOSPITAL_COMMUNITY): Payer: Medicare Other | Admitting: Anesthesiology

## 2018-01-26 ENCOUNTER — Inpatient Hospital Stay (HOSPITAL_COMMUNITY)
Admission: RE | Admit: 2018-01-26 | Discharge: 2018-01-29 | DRG: 464 | Disposition: A | Discharge status: Home Health | Payer: Medicare Other | Attending: Orthopedic Surgery | Admitting: Orthopedic Surgery

## 2018-01-26 ENCOUNTER — Inpatient Hospital Stay (HOSPITAL_COMMUNITY): Payer: Medicare Other

## 2018-01-26 ENCOUNTER — Encounter (HOSPITAL_COMMUNITY)
Admission: RE | Disposition: A | Discharge status: Home Health | Payer: Self-pay | Source: Home / Self Care | Attending: Orthopedic Surgery

## 2018-01-26 DIAGNOSIS — T8451XA Infection and inflammatory reaction due to internal right hip prosthesis, initial encounter: Secondary | ICD-10-CM | POA: Diagnosis present

## 2018-01-26 DIAGNOSIS — Y831 Surgical operation with implant of artificial internal device as the cause of abnormal reaction of the patient, or of later complication, without mention of misadventure at the time of the procedure: Secondary | ICD-10-CM | POA: Diagnosis present

## 2018-01-26 DIAGNOSIS — L905 Scar conditions and fibrosis of skin: Secondary | ICD-10-CM | POA: Diagnosis present

## 2018-01-26 DIAGNOSIS — D649 Anemia, unspecified: Secondary | ICD-10-CM | POA: Diagnosis not present

## 2018-01-26 DIAGNOSIS — M1990 Unspecified osteoarthritis, unspecified site: Secondary | ICD-10-CM | POA: Diagnosis present

## 2018-01-26 DIAGNOSIS — N400 Enlarged prostate without lower urinary tract symptoms: Secondary | ICD-10-CM | POA: Diagnosis present

## 2018-01-26 DIAGNOSIS — I6523 Occlusion and stenosis of bilateral carotid arteries: Secondary | ICD-10-CM | POA: Diagnosis present

## 2018-01-26 DIAGNOSIS — K219 Gastro-esophageal reflux disease without esophagitis: Secondary | ICD-10-CM | POA: Diagnosis present

## 2018-01-26 DIAGNOSIS — D62 Acute posthemorrhagic anemia: Secondary | ICD-10-CM | POA: Diagnosis not present

## 2018-01-26 DIAGNOSIS — I4891 Unspecified atrial fibrillation: Secondary | ICD-10-CM | POA: Diagnosis present

## 2018-01-26 DIAGNOSIS — T5691XA Toxic effect of unspecified metal, accidental (unintentional), initial encounter: Secondary | ICD-10-CM

## 2018-01-26 DIAGNOSIS — I10 Essential (primary) hypertension: Secondary | ICD-10-CM | POA: Diagnosis present

## 2018-01-26 DIAGNOSIS — M25451 Effusion, right hip: Secondary | ICD-10-CM | POA: Diagnosis present

## 2018-01-26 DIAGNOSIS — T8451XD Infection and inflammatory reaction due to internal right hip prosthesis, subsequent encounter: Secondary | ICD-10-CM | POA: Diagnosis not present

## 2018-01-26 DIAGNOSIS — Z9889 Other specified postprocedural states: Secondary | ICD-10-CM | POA: Diagnosis not present

## 2018-01-26 DIAGNOSIS — Z8249 Family history of ischemic heart disease and other diseases of the circulatory system: Secondary | ICD-10-CM

## 2018-01-26 DIAGNOSIS — Z95828 Presence of other vascular implants and grafts: Secondary | ICD-10-CM | POA: Diagnosis not present

## 2018-01-26 DIAGNOSIS — Z96649 Presence of unspecified artificial hip joint: Secondary | ICD-10-CM

## 2018-01-26 DIAGNOSIS — B958 Unspecified staphylococcus as the cause of diseases classified elsewhere: Secondary | ICD-10-CM

## 2018-01-26 DIAGNOSIS — E785 Hyperlipidemia, unspecified: Secondary | ICD-10-CM | POA: Diagnosis present

## 2018-01-26 HISTORY — PX: INCISION AND DRAINAGE HIP: SHX1801

## 2018-01-26 LAB — POCT I-STAT 4, (NA,K, GLUC, HGB,HCT)
Glucose, Bld: 103 mg/dL — ABNORMAL HIGH (ref 70–99)
HEMATOCRIT: 20 % — AB (ref 39.0–52.0)
Hemoglobin: 6.8 g/dL — CL (ref 13.0–17.0)
Potassium: 3.6 mmol/L (ref 3.5–5.1)
SODIUM: 139 mmol/L (ref 135–145)

## 2018-01-26 LAB — CBC
HCT: 23.8 % — ABNORMAL LOW (ref 39.0–52.0)
HEMOGLOBIN: 7.8 g/dL — AB (ref 13.0–17.0)
MCH: 31.8 pg (ref 26.0–34.0)
MCHC: 32.8 g/dL (ref 30.0–36.0)
MCV: 97.1 fL (ref 78.0–100.0)
Platelets: 273 10*3/uL (ref 150–400)
RBC: 2.45 MIL/uL — AB (ref 4.22–5.81)
RDW: 13.5 % (ref 11.5–15.5)
WBC: 12.7 10*3/uL — ABNORMAL HIGH (ref 4.0–10.5)

## 2018-01-26 LAB — HEMOGLOBIN AND HEMATOCRIT, BLOOD
HCT: 23.8 % — ABNORMAL LOW (ref 39.0–52.0)
Hemoglobin: 7.9 g/dL — ABNORMAL LOW (ref 13.0–17.0)

## 2018-01-26 SURGERY — IRRIGATION AND DEBRIDEMENT HIP WITH POLY EXCHANGE
Anesthesia: Spinal | Site: Hip | Laterality: Right

## 2018-01-26 MED ORDER — ACETAMINOPHEN 325 MG PO TABS
325.0000 mg | ORAL_TABLET | Freq: Four times a day (QID) | ORAL | Status: DC | PRN
  Filled 2018-01-26: qty 2

## 2018-01-26 MED ORDER — ACETAMINOPHEN 10 MG/ML IV SOLN
1000.0000 mg | Freq: Once | INTRAVENOUS | Status: AC
Start: 2018-01-26 — End: 2018-01-26
  Administered 2018-01-26: 1000 mg via INTRAVENOUS

## 2018-01-26 MED ORDER — APIXABAN 2.5 MG PO TABS
2.5000 mg | ORAL_TABLET | Freq: Two times a day (BID) | ORAL | Status: DC
  Administered 2018-01-27 – 2018-01-29 (×4): 2.5 mg via ORAL
  Filled 2018-01-26 (×5): qty 1

## 2018-01-26 MED ORDER — NON FORMULARY: 40.0000 mg | Freq: Every day | Status: DC

## 2018-01-26 MED ORDER — KETOROLAC TROMETHAMINE 15 MG/ML IJ SOLN
INTRAMUSCULAR | Status: AC
Start: 2018-01-26 — End: 2018-01-27
  Filled 2018-01-26: qty 1

## 2018-01-26 MED ORDER — SODIUM CHLORIDE 0.9 % IV SOLN
INTRAVENOUS | Status: DC | PRN
  Administered 2018-01-26 (×2): via INTRAVENOUS

## 2018-01-26 MED ORDER — METOCLOPRAMIDE HCL 5 MG PO TABS: 5.0000 mg | ORAL_TABLET | Freq: Three times a day (TID) | ORAL | Status: DC | PRN

## 2018-01-26 MED ORDER — SODIUM CHLORIDE 0.9 % IV SOLN: INTRAVENOUS | Status: DC

## 2018-01-26 MED ORDER — MEPERIDINE HCL 50 MG/ML IJ SOLN: 6.2500 mg | INTRAMUSCULAR | Status: DC | PRN

## 2018-01-26 MED ORDER — VANCOMYCIN HCL 1000 MG IV SOLR
INTRAVENOUS | Status: DC | PRN
  Administered 2018-01-26: 2000 mg

## 2018-01-26 MED ORDER — SODIUM CHLORIDE 0.9 % IV SOLN
INTRAVENOUS | Status: DC | PRN
  Administered 2018-01-26: 25 ug/min via INTRAVENOUS

## 2018-01-26 MED ORDER — ONDANSETRON HCL 4 MG PO TABS: 4.0000 mg | ORAL_TABLET | Freq: Four times a day (QID) | ORAL | Status: DC | PRN

## 2018-01-26 MED ORDER — TRANEXAMIC ACID 1000 MG/10ML IV SOLN
1000.0000 mg | Freq: Once | INTRAVENOUS | Status: AC
  Administered 2018-01-26: 1000 mg via INTRAVENOUS
  Filled 2018-01-26: qty 1000

## 2018-01-26 MED ORDER — PANTOPRAZOLE SODIUM 40 MG PO TBEC
80.0000 mg | DELAYED_RELEASE_TABLET | Freq: Every day | ORAL | Status: DC
  Administered 2018-01-27 – 2018-01-29 (×3): 80 mg via ORAL
  Filled 2018-01-26 (×3): qty 2

## 2018-01-26 MED ORDER — CEFAZOLIN SODIUM-DEXTROSE 2-4 GM/100ML-% IV SOLN
2.0000 g | INTRAVENOUS | Status: AC
  Administered 2018-01-26: 2 g via INTRAVENOUS

## 2018-01-26 MED ORDER — DORZOLAMIDE HCL-TIMOLOL MAL PF 22.3-6.8 MG/ML OP SOLN
1.0000 [drp] | Freq: Every day | OPHTHALMIC | Status: DC
  Filled 2018-01-26: qty 0.1

## 2018-01-26 MED ORDER — FLECAINIDE ACETATE 50 MG PO TABS
50.0000 mg | ORAL_TABLET | Freq: Two times a day (BID) | ORAL | Status: DC
  Administered 2018-01-26 – 2018-01-29 (×6): 50 mg via ORAL
  Filled 2018-01-26 (×6): qty 1

## 2018-01-26 MED ORDER — LIDOCAINE 2% (20 MG/ML) 5 ML SYRINGE
INTRAMUSCULAR | Status: DC | PRN
  Administered 2018-01-26: 75 mg via INTRAVENOUS
  Administered 2018-01-26: 25 mg via INTRAVENOUS

## 2018-01-26 MED ORDER — VANCOMYCIN HCL 10 G IV SOLR
1500.0000 mg | Freq: Once | INTRAVENOUS | Status: AC
  Administered 2018-01-26: 1500 mg via INTRAVENOUS
  Filled 2018-01-26: qty 1500

## 2018-01-26 MED ORDER — FENTANYL CITRATE (PF) 100 MCG/2ML IJ SOLN
INTRAMUSCULAR | Status: DC | PRN
  Administered 2018-01-26: 100 ug via INTRAVENOUS
  Administered 2018-01-26 (×2): 25 ug via INTRAVENOUS
  Administered 2018-01-26 (×2): 100 ug via INTRAVENOUS
  Administered 2018-01-26: 25 ug via INTRAVENOUS
  Administered 2018-01-26: 100 ug via INTRAVENOUS
  Administered 2018-01-26: 25 ug via INTRAVENOUS

## 2018-01-26 MED ORDER — METHOCARBAMOL 500 MG IVPB - SIMPLE MED
INTRAVENOUS | Status: AC
  Filled 2018-01-26: qty 50

## 2018-01-26 MED ORDER — DEXAMETHASONE SODIUM PHOSPHATE 10 MG/ML IJ SOLN
INTRAMUSCULAR | Status: DC | PRN
  Administered 2018-01-26: 10 mg via INTRAVENOUS

## 2018-01-26 MED ORDER — ALUM & MAG HYDROXIDE-SIMETH 200-200-20 MG/5ML PO SUSP: 15.0000 mL | ORAL | Status: DC | PRN

## 2018-01-26 MED ORDER — HYDROCODONE-ACETAMINOPHEN 5-325 MG PO TABS
1.0000 | ORAL_TABLET | ORAL | Status: DC | PRN
  Administered 2018-01-28 – 2018-01-29 (×2): 1 via ORAL
  Filled 2018-01-26 (×2): qty 1

## 2018-01-26 MED ORDER — VANCOMYCIN HCL 1000 MG IV SOLR
INTRAVENOUS | Status: AC
  Filled 2018-01-26: qty 2000

## 2018-01-26 MED ORDER — ONDANSETRON HCL 4 MG/2ML IJ SOLN: 4.0000 mg | Freq: Once | INTRAMUSCULAR | Status: DC | PRN

## 2018-01-26 MED ORDER — FENTANYL CITRATE (PF) 250 MCG/5ML IJ SOLN
INTRAMUSCULAR | Status: AC
  Filled 2018-01-26: qty 5

## 2018-01-26 MED ORDER — PHENOL 1.4 % MT LIQD
1.0000 | OROMUCOSAL | Status: DC | PRN
  Filled 2018-01-26: qty 177

## 2018-01-26 MED ORDER — CHLORHEXIDINE GLUCONATE 4 % EX LIQD: 60.0000 mL | Freq: Once | CUTANEOUS | Status: DC

## 2018-01-26 MED ORDER — TRANEXAMIC ACID 1000 MG/10ML IV SOLN
INTRAVENOUS | Status: AC
  Filled 2018-01-26: qty 10

## 2018-01-26 MED ORDER — TRANEXAMIC ACID 1000 MG/10ML IV SOLN
1000.0000 mg | INTRAVENOUS | Status: AC
  Administered 2018-01-26: 1000 mg via INTRAVENOUS

## 2018-01-26 MED ORDER — POLYSACCHARIDE IRON COMPLEX 150 MG PO CAPS
150.0000 mg | ORAL_CAPSULE | Freq: Every day | ORAL | Status: DC
  Administered 2018-01-26 – 2018-01-29 (×4): 150 mg via ORAL
  Filled 2018-01-26 (×4): qty 1

## 2018-01-26 MED ORDER — ACETAMINOPHEN 10 MG/ML IV SOLN
INTRAVENOUS | Status: AC
  Filled 2018-01-26: qty 100

## 2018-01-26 MED ORDER — HYDROCODONE-ACETAMINOPHEN 7.5-325 MG PO TABS
1.0000 | ORAL_TABLET | ORAL | Status: DC | PRN
  Administered 2018-01-26 – 2018-01-27 (×3): 1 via ORAL
  Filled 2018-01-26 (×3): qty 1

## 2018-01-26 MED ORDER — MENTHOL 3 MG MT LOZG: 1.0000 | LOZENGE | OROMUCOSAL | Status: DC | PRN

## 2018-01-26 MED ORDER — HYDROMORPHONE HCL 1 MG/ML IJ SOLN
0.2500 mg | INTRAMUSCULAR | Status: DC | PRN
  Administered 2018-01-26 (×6): 0.5 mg via INTRAVENOUS

## 2018-01-26 MED ORDER — FENTANYL CITRATE (PF) 100 MCG/2ML IJ SOLN
INTRAMUSCULAR | Status: AC
  Filled 2018-01-26: qty 4

## 2018-01-26 MED ORDER — KETOROLAC TROMETHAMINE 15 MG/ML IJ SOLN
15.0000 mg | Freq: Once | INTRAMUSCULAR | Status: AC
  Administered 2018-01-26: 15 mg via INTRAVENOUS

## 2018-01-26 MED ORDER — METHOCARBAMOL 500 MG IVPB - SIMPLE MED
500.0000 mg | Freq: Four times a day (QID) | INTRAVENOUS | Status: DC | PRN
  Administered 2018-01-26: 500 mg via INTRAVENOUS
  Filled 2018-01-26: qty 50

## 2018-01-26 MED ORDER — ONDANSETRON HCL 4 MG/2ML IJ SOLN
INTRAMUSCULAR | Status: DC | PRN
  Administered 2018-01-26: 4 mg via INTRAVENOUS

## 2018-01-26 MED ORDER — HYDROMORPHONE HCL 1 MG/ML IJ SOLN
INTRAMUSCULAR | Status: AC
  Filled 2018-01-26: qty 1

## 2018-01-26 MED ORDER — HYDROMORPHONE HCL 1 MG/ML IJ SOLN
INTRAMUSCULAR | Status: AC
  Filled 2018-01-26: qty 2

## 2018-01-26 MED ORDER — ATORVASTATIN CALCIUM 40 MG PO TABS
40.0000 mg | ORAL_TABLET | Freq: Every day | ORAL | Status: DC
  Administered 2018-01-27 – 2018-01-29 (×3): 40 mg via ORAL
  Filled 2018-01-26 (×3): qty 1

## 2018-01-26 MED ORDER — METHOCARBAMOL 500 MG PO TABS: 500.0000 mg | ORAL_TABLET | Freq: Four times a day (QID) | ORAL | Status: DC | PRN

## 2018-01-26 MED ORDER — MAGNESIUM CITRATE PO SOLN: 1.0000 | Freq: Once | ORAL | Status: DC | PRN

## 2018-01-26 MED ORDER — STERILE WATER FOR IRRIGATION IR SOLN
Status: DC | PRN
  Administered 2018-01-26: 2000 mL

## 2018-01-26 MED ORDER — ONDANSETRON HCL 4 MG/2ML IJ SOLN: 4.0000 mg | Freq: Four times a day (QID) | INTRAMUSCULAR | Status: DC | PRN

## 2018-01-26 MED ORDER — CEFAZOLIN SODIUM-DEXTROSE 2-4 GM/100ML-% IV SOLN
INTRAVENOUS | Status: AC
  Filled 2018-01-26: qty 100

## 2018-01-26 MED ORDER — DEXAMETHASONE SODIUM PHOSPHATE 10 MG/ML IJ SOLN
10.0000 mg | Freq: Once | INTRAMUSCULAR | Status: AC
  Administered 2018-01-27: 10 mg via INTRAVENOUS
  Filled 2018-01-26: qty 1

## 2018-01-26 MED ORDER — ALBUMIN HUMAN 5 % IV SOLN
INTRAVENOUS | Status: DC | PRN
  Administered 2018-01-26: 17:00:00 via INTRAVENOUS

## 2018-01-26 MED ORDER — PROPOFOL 10 MG/ML IV BOLUS
INTRAVENOUS | Status: DC | PRN
  Administered 2018-01-26: 130 mg via INTRAVENOUS

## 2018-01-26 MED ORDER — DOCUSATE SODIUM 100 MG PO CAPS
100.0000 mg | ORAL_CAPSULE | Freq: Two times a day (BID) | ORAL | Status: DC
  Administered 2018-01-26 – 2018-01-29 (×4): 100 mg via ORAL
  Filled 2018-01-26 (×5): qty 1

## 2018-01-26 MED ORDER — ROCURONIUM BROMIDE 10 MG/ML (PF) SYRINGE
PREFILLED_SYRINGE | INTRAVENOUS | Status: DC | PRN
  Administered 2018-01-26: 60 mg via INTRAVENOUS

## 2018-01-26 MED ORDER — PHENYLEPHRINE HCL 10 MG/ML IJ SOLN
INTRAMUSCULAR | Status: AC
  Filled 2018-01-26: qty 1

## 2018-01-26 MED ORDER — DIPHENHYDRAMINE HCL 12.5 MG/5ML PO ELIX: 12.5000 mg | ORAL_SOLUTION | ORAL | Status: DC | PRN

## 2018-01-26 MED ORDER — FENTANYL CITRATE (PF) 100 MCG/2ML IJ SOLN
25.0000 ug | INTRAMUSCULAR | Status: DC | PRN
  Administered 2018-01-26 (×3): 50 ug via INTRAVENOUS

## 2018-01-26 MED ORDER — FINASTERIDE 5 MG PO TABS
5.0000 mg | ORAL_TABLET | Freq: Every day | ORAL | Status: DC
  Filled 2018-01-26: qty 1

## 2018-01-26 MED ORDER — SUCCINYLCHOLINE CHLORIDE 200 MG/10ML IV SOSY
PREFILLED_SYRINGE | INTRAVENOUS | Status: AC
  Filled 2018-01-26: qty 10

## 2018-01-26 MED ORDER — VANCOMYCIN HCL IN DEXTROSE 1-5 GM/200ML-% IV SOLN
1000.0000 mg | INTRAVENOUS | Status: DC
  Administered 2018-01-27: 1000 mg via INTRAVENOUS
  Filled 2018-01-26: qty 200

## 2018-01-26 MED ORDER — METOCLOPRAMIDE HCL 5 MG/ML IJ SOLN: 5.0000 mg | Freq: Three times a day (TID) | INTRAMUSCULAR | Status: DC | PRN

## 2018-01-26 MED ORDER — SODIUM CHLORIDE 0.9 % IR SOLN
Status: DC | PRN
  Administered 2018-01-26 (×3): 3000 mL

## 2018-01-26 MED ORDER — ONDANSETRON HCL 4 MG/2ML IJ SOLN
INTRAMUSCULAR | Status: AC
  Filled 2018-01-26: qty 2

## 2018-01-26 MED ORDER — DEXAMETHASONE SODIUM PHOSPHATE 10 MG/ML IJ SOLN: 10.0000 mg | Freq: Once | INTRAMUSCULAR | Status: DC

## 2018-01-26 MED ORDER — CEFAZOLIN SODIUM-DEXTROSE 2-4 GM/100ML-% IV SOLN
2.0000 g | Freq: Four times a day (QID) | INTRAVENOUS | Status: AC
  Administered 2018-01-26 – 2018-01-27 (×2): 2 g via INTRAVENOUS
  Filled 2018-01-26 (×2): qty 100

## 2018-01-26 MED ORDER — BISACODYL 10 MG RE SUPP: 10.0000 mg | Freq: Every day | RECTAL | Status: DC | PRN

## 2018-01-26 MED ORDER — SUGAMMADEX SODIUM 200 MG/2ML IV SOLN
INTRAVENOUS | Status: DC | PRN
  Administered 2018-01-26: 150 mg via INTRAVENOUS

## 2018-01-26 MED ORDER — LACTATED RINGERS IV SOLN
INTRAVENOUS | Status: DC
  Administered 2018-01-26: 17:00:00 via INTRAVENOUS
  Administered 2018-01-26: 1000 mL via INTRAVENOUS
  Administered 2018-01-26: 17:00:00 via INTRAVENOUS

## 2018-01-26 MED ORDER — PROPOFOL 10 MG/ML IV BOLUS
INTRAVENOUS | Status: AC
Start: 2018-01-26 — End: ?
  Filled 2018-01-26: qty 20

## 2018-01-26 MED ORDER — ROCURONIUM BROMIDE 10 MG/ML (PF) SYRINGE
PREFILLED_SYRINGE | INTRAVENOUS | Status: AC
  Filled 2018-01-26: qty 10

## 2018-01-26 MED ORDER — SODIUM CHLORIDE 0.9 % IR SOLN
Status: DC | PRN
  Administered 2018-01-26: 1000 mL

## 2018-01-26 MED ORDER — HYDROMORPHONE HCL 1 MG/ML IJ SOLN: 0.5000 mg | INTRAMUSCULAR | Status: DC | PRN

## 2018-01-26 MED ORDER — POLYETHYLENE GLYCOL 3350 17 G PO PACK
17.0000 g | PACK | Freq: Two times a day (BID) | ORAL | Status: DC
  Administered 2018-01-26 – 2018-01-29 (×5): 17 g via ORAL
  Filled 2018-01-26 (×6): qty 1

## 2018-01-26 MED ORDER — DEXAMETHASONE SODIUM PHOSPHATE 10 MG/ML IJ SOLN
INTRAMUSCULAR | Status: AC
  Filled 2018-01-26: qty 1

## 2018-01-26 MED ORDER — CELECOXIB 200 MG PO CAPS
200.0000 mg | ORAL_CAPSULE | Freq: Two times a day (BID) | ORAL | Status: DC
  Administered 2018-01-26 – 2018-01-29 (×6): 200 mg via ORAL
  Filled 2018-01-26 (×6): qty 1

## 2018-01-26 SURGICAL SUPPLY — 61 items
BAG DECANTER FOR FLEXI CONT (MISCELLANEOUS) ×2 IMPLANT
BAG ZIPLOCK 12X15 (MISCELLANEOUS) ×2 IMPLANT
BLADE SAW SGTL 11.0X1.19X90.0M (BLADE) IMPLANT
BLADE SAW SGTL 18X1.27X75 (BLADE) ×2 IMPLANT
BRUSH FEMORAL CANAL (MISCELLANEOUS) IMPLANT
COVER SURGICAL LIGHT HANDLE (MISCELLANEOUS) ×2 IMPLANT
DERMABOND ADVANCED (GAUZE/BANDAGES/DRESSINGS) ×1
DERMABOND ADVANCED .7 DNX12 (GAUZE/BANDAGES/DRESSINGS) ×1 IMPLANT
DRAPE ORTHO SPLIT 77X108 STRL (DRAPES) ×2
DRAPE POUCH INSTRU U-SHP 10X18 (DRAPES) ×2 IMPLANT
DRAPE SURG 17X11 SM STRL (DRAPES) ×2 IMPLANT
DRAPE SURG ORHT 6 SPLT 77X108 (DRAPES) ×2 IMPLANT
DRAPE U-SHAPE 47X51 STRL (DRAPES) ×2 IMPLANT
DRESSING ALLEVYN LIFE SACRUM (GAUZE/BANDAGES/DRESSINGS) ×2 IMPLANT
DRESSING AQUACEL AG SP 3.5X10 (GAUZE/BANDAGES/DRESSINGS) IMPLANT
DRSG AQUACEL AG ADV 3.5X10 (GAUZE/BANDAGES/DRESSINGS) IMPLANT
DRSG AQUACEL AG ADV 3.5X14 (GAUZE/BANDAGES/DRESSINGS) ×2 IMPLANT
DRSG AQUACEL AG SP 3.5X10 (GAUZE/BANDAGES/DRESSINGS)
DRSG TEGADERM 4X4.75 (GAUZE/BANDAGES/DRESSINGS) ×2 IMPLANT
DURAPREP 26ML APPLICATOR (WOUND CARE) ×2 IMPLANT
ELECT BLADE TIP CTD 4 INCH (ELECTRODE) ×2 IMPLANT
ELECT REM PT RETURN 15FT ADLT (MISCELLANEOUS) ×2 IMPLANT
FACESHIELD WRAPAROUND (MASK) ×8 IMPLANT
GAUZE SPONGE 2X2 8PLY STRL LF (GAUZE/BANDAGES/DRESSINGS) ×1 IMPLANT
GAUZE XEROFORM 1X8 LF (GAUZE/BANDAGES/DRESSINGS) ×2 IMPLANT
GLOVE BIOGEL M 7.0 STRL (GLOVE) IMPLANT
GLOVE BIOGEL PI IND STRL 7.5 (GLOVE) ×1 IMPLANT
GLOVE BIOGEL PI IND STRL 8.5 (GLOVE) ×1 IMPLANT
GLOVE BIOGEL PI INDICATOR 7.5 (GLOVE) ×1
GLOVE BIOGEL PI INDICATOR 8.5 (GLOVE) ×1
GLOVE ECLIPSE 8.0 STRL XLNG CF (GLOVE) ×4 IMPLANT
GOWN STRL REUS W/TWL 2XL LVL3 (GOWN DISPOSABLE) ×2 IMPLANT
GOWN STRL REUS W/TWL LRG LVL3 (GOWN DISPOSABLE) ×2 IMPLANT
HANDPIECE INTERPULSE COAX TIP (DISPOSABLE)
HEAD FEM 32 CERAMIC HIP C-TAP ×2 IMPLANT
MANIFOLD NEPTUNE II (INSTRUMENTS) ×2 IMPLANT
MARKER SKIN DUAL TIP RULER LAB (MISCELLANEOUS) ×2 IMPLANT
NDL SAFETY ECLIPSE 18X1.5 (NEEDLE) ×1 IMPLANT
NEEDLE HYPO 18GX1.5 SHARP (NEEDLE) ×1
NS IRRIG 1000ML POUR BTL (IV SOLUTION) ×4 IMPLANT
PADDING CAST COTTON 6X4 STRL (CAST SUPPLIES) ×2 IMPLANT
POSITIONER SURGICAL ARM (MISCELLANEOUS) ×2 IMPLANT
PRESSURIZER FEMORAL UNIV (MISCELLANEOUS) IMPLANT
SET HNDPC FAN SPRY TIP SCT (DISPOSABLE) IMPLANT
SLEEVE TAPLER V-40/C ×2 IMPLANT
SPONGE GAUZE 2X2 STER 10/PKG (GAUZE/BANDAGES/DRESSINGS) ×1
SPONGE LAP 18X18 RF (DISPOSABLE) ×2 IMPLANT
SPONGE LAP 4X18 RFD (DISPOSABLE) ×2 IMPLANT
STAPLER VISISTAT 35W (STAPLE) ×2 IMPLANT
SUCTION FRAZIER HANDLE 10FR (MISCELLANEOUS) ×1
SUCTION TUBE FRAZIER 10FR DISP (MISCELLANEOUS) ×1 IMPLANT
SUT STRATAFIX PDS+ 0 24IN (SUTURE) ×2 IMPLANT
SUT VIC AB 1 CT1 36 (SUTURE) ×2 IMPLANT
SUT VIC AB 2-0 CT1 27 (SUTURE) ×4
SUT VIC AB 2-0 CT1 TAPERPNT 27 (SUTURE) ×4 IMPLANT
TOWEL OR 17X26 10 PK STRL BLUE (TOWEL DISPOSABLE) ×4 IMPLANT
TOWER CARTRIDGE SMART MIX (DISPOSABLE) IMPLANT
TRAY FOLEY MTR SLVR 16FR STAT (SET/KITS/TRAYS/PACK) ×2 IMPLANT
TUBE KAMVAC SUCTION (TUBING) IMPLANT
WATER STERILE IRR 1000ML POUR (IV SOLUTION) ×2 IMPLANT
YANKAUER SUCT BULB TIP 10FT TU (MISCELLANEOUS) ×2 IMPLANT

## 2018-01-26 NOTE — Anesthesia Preprocedure Evaluation (Addendum)
Anesthesia Evaluation  Patient identified by MRN, date of birth, ID band Patient awake    Reviewed: Allergy & Precautions, NPO status , Patient's Chart, lab work & pertinent test results  Airway Mallampati: I       Dental no notable dental hx. (+) Teeth Intact   Pulmonary neg pulmonary ROS,    Pulmonary exam normal breath sounds clear to auscultation       Cardiovascular hypertension, Normal cardiovascular exam+ dysrhythmias Atrial Fibrillation  Rhythm:Regular Rate:Normal  [facility]   Neuro/Psych negative neurological ROS  negative psych ROS   GI/Hepatic Neg liver ROS, GERD  Medicated,  Endo/Other    Renal/GU negative Renal ROS  negative genitourinary   Musculoskeletal  (+) Arthritis , Osteoarthritis,    Abdominal Normal abdominal exam  (+)   Peds  Hematology negative hematology ROS (+)   Anesthesia Other Findings   Reproductive/Obstetrics                           Anesthesia Physical Anesthesia Plan  ASA: II  Anesthesia Plan: General   Post-op Pain Management:    Induction:   PONV Risk Score and Plan: 3 and Ondansetron and Dexamethasone  Airway Management Planned: Oral ETT  Additional Equipment:   Intra-op Plan:   Post-operative Plan: Extubation in OR  Informed Consent: I have reviewed the patients History and Physical, chart, labs and discussed the procedure including the risks, benefits and alternatives for the proposed anesthesia with the patient or authorized representative who has indicated his/her understanding and acceptance.   Dental advisory given  Plan Discussed with: CRNA and Surgeon  Anesthesia Plan Comments:        Anesthesia Quick Evaluation

## 2018-01-26 NOTE — Brief Op Note (Signed)
01/26/2018  3:49 PM  PATIENT:  Travis Wolfe  83 y.o. 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 (associated with possible metalosis) and right posterior thigh swelling  PROCEDURE:  Procedure(s) with comments: Right hip irrigation and debridement, excisional and non excisional debridement, head liner exchange, posterior approach (Right) - 90 mins Would like to start earlier around 2:00pm if time opens  SURGEON:  Surgeon(s) and Role:    Durene Romans* Chavela Justiniano, MD - Primary  PHYSICIAN ASSISTANT: Lanney GinsMatthew Babish, PA-C  ANESTHESIA:   general  EBL:  1500 cc  BLOOD ADMINISTERED:none  DRAINS: (2 medium) Hemovact drain(s) in the right hip, anteriorly and posterior thigh with  Suction Open   LOCAL MEDICATIONS USED:  NONE  SPECIMEN:  Source of Specimen:  right hip fluid  DISPOSITION OF SPECIMEN:  PATHOLOGY  COUNTS:  YES  TOURNIQUET:  * No tourniquets in log *  DICTATION: .Other Dictation: Dictation Number [id number]  PLAN OF CARE: Admit to inpatient   PATIENT DISPOSITION:  PACU - hemodynamically stable.   Delay start of Pharmacological VTE agent (>24hrs) due to surgical blood loss or risk of bleeding: no

## 2018-01-26 NOTE — Op Note (Signed)
NAME: Travis Wolfe, [name] MEDICAL RECORD ZO:[id number] ACCOUNT [id number] DATE OF BIRTH:[birth year] FACILITY: [facility] LOCATION: [facility] PHYSICIAN:Sladen Plancarte Rosalia Hammers. Reon Hunley, MD  OPERATIVE REPORT  DATE OF PROCEDURE:  01/26/2018  PREOPERATIVE DIAGNOSIS:  Recurrent right hip effusions and posterior thigh swelling with associated concern for either infection versus metalosis.  POSTOPERATIVE DIAGNOSIS:  Recurrent right hip effusions and posterior thigh swelling with associated concern for either infection versus metalosis.  PROCEDURE: 1.  Excisional and nonexcisional debridement of right hip.  This included a 10 inch incision of subcutaneous tissue and skin nonviable pericapsular tissue as well as nonexcisional debridement with 9 liters of normal saline solution. 2.  Revision of the patient's femoral head to a Stryker 32+5 C taper head with a V40/C taper titanium sleeve.  SURGEON:  Durene RomansMatthew Orie Baxendale, MD  ASSISTANT:  Lanney GinsMatthew Babish PA-C.  Note that Travis Wolfe was present for the entirety of the case in preoperative position, perioperative management of the operative extremity, general facilitation of the case and primary wound closure.  ANESTHESIA:  General.  SPECIMENS:  I did take culture swabs as well as a syringe full of fluid and soft tissue sent to pathology to evaluate for either metalosis versus infection.  DRAINS:  Two Hemovac drains were placed, one to be placed into the anterior aspect of the hip and the second in the posterior thigh, but still in a tract identified from the surgical incision site.  ESTIMATED BLOOD LOSS:  1500 mL.  COMPLICATIONS:  None apparent.  INDICATIONS:  The patient is an 83 year old gentleman with a history of right hip replacement surgery.  He apparently had an index total hip arthroplasty performed with cement perhaps 25 years ago.  This failed due to the loosening and was revised to a  press-fit component.  Acetabular revision information is not available.  Following  this revision surgery up to about 2 years ago, he has been doing well until he noted swelling.  [facility] surgical procedures identifying  brown fluid in this hip.  Apparently, he had a metal ball that was revised to a ceramic ball but acetabular shell and liner remained.  [facility] subsequently [facility] for recurrent swelling and pain given these findings.  He has had 3 other  episodes of aspiration of his right hip and thigh in an attempt to remove fluid and decompress this area due to pain.  This has been very a frustrating course.  We had a lengthy discussion in the office regarding options available.  An attempt at  aspiration of the right hip joint revealed suspicion for a coag negative staph representing possibly Staph epidermidis versus something inside the joint.  He was scheduled for surgery for I and D, possible head and liner exchange versus evaluation and  debridement.  Risks of recurrent swelling, risk of infection, risk of need for future surgery, in addition to DVT, dislocation were all discussed and reviewed.  Consent was obtained for benefit of management of this.  DESCRIPTION OF PROCEDURE:  The patient was brought to the operative theater.  Once adequate anesthesia, preoperative antibiotics administered.  He was positioned in the left lateral decubitus position with the right hip up.  He was given tranexamic acid  and Decadron.  His right lower extremities were prepped and draped in sterile fashion.  A timeout was performed identifying the patient, the planned procedure, and extremity.  He had 2 separate incisions over the posterolateral aspect of the hip.  It  shows the more superior as opposed to true [place] approach.  His anterior hip surgery previously had been performed as well.  I used his old incision excising the old scar and subcutaneous tissue.  We dissected down to the iliotibial band and gluteal  fascia.  Upon penetration of the  iliotibial band, we encountered a significant rush of brown fluid.  Here we immediately took culture swabs.  I then further opened up the iliotibial band and gluteal fascia and more of this fluid was identified and a  syringe was used to aspirate some of this to send off as well.  This will be analyzed for Gram stain culture as well as metalosis.  At this point, I spent time exposing and sharply debriding this pericapsular tissue that contained this brown fluid.  This was brown stained soft tissues.  We identified a tract that went posteriorly and suctioned out as well as expressed as much fluid  upwards as we could in the posterior thigh.  With the posterior aspect of the hip exposed, we identified a significant amount of his acetabular shell, exposed with Bovie that appeared to have some evidence of necrosis.  Part of what I initially planned to the preoperative plan was to consider removing his acetabular shell.  Upon further dissection and debridement and identifying the landmarks, I became worried about the possibility of the bone viability and bony  ingrowth of a new shell and thus elected to keep the shell.  Due to the age of the shell, available polyethylenes were not present.  We removed the femoral head from his joint.  The joint appeared to be intact without significant damage.  I then placed the trunnion anteriorly and retracted the femur anteriorly to allow for further exposure posteriorly.  I used a combination of  rongeurs as well as curette to further debriding soft tissues around the proximal femur and the posterior aspect of the hip anteriorly.  We did identify a tract that went into the anterior aspect of the joint from previous surgical exposure and again  evacuated a large amount of brown tissue from the anterior aspect.  I was able to place the suction into the anterior aspect of the hip as well as the distal thigh to try to remove as much fluid as possible.  He did have some  bleeding tendencies and was  oozing from the soft tissue dissection, but no active bleeding identified.  Once I felt that I had satisfactorily debrided the soft tissues and making the clinical decisions based on the morbidity of explant versus this type of debridement procedure, we  irrigated the hip with 9 liters normal saline solution.  This was carried out and the direct posterior aspect of the hip as well as anterior to the tract as well as inferior and posterior.  Once I had performed an excisional and non-excisional  debridement of the case, we opened up a similar match 32+5 delta ceramic ball and then utilized a titanium sleeve that matches the stem.  We impacted this on the clean and dried trunnion.  The hip was reduced.  This was done for stability purposes based  on previous surgical experiences.  At this point, I opened up 2 vials of vancomycin powder and sprinkled about 3/4 of this into the posterior aspect of the hip and then placed 2 drains.  I put the drain holes anteriorly and placed the tubing anterior of  the hip as well as a second tube that I placed to  try to track it down to the posterior tract of the femur by the thigh.  Following this, we reapproximated the iliotibial band and gluteal fascia with a combination of #1 Vicryl and Stratafix suture.  The  remainder of the wound was closed with 2-0 Vicryl and a Monocryl stitch.  Hip was clean, dry and dressed sterilely using surgical glue and Aquacel dressing.  The drain sites were dressed.  TN/NUANCE  D:01/26/2018 T:01/26/2018 JOB:[id number]

## 2018-01-26 NOTE — Transfer of Care (Signed)
Immediate Anesthesia Transfer of Care Note  Patient: Travis HeritageRobert L Wolfe  Procedure(s) Performed: Right hip irrigation and debridement, excisional and non excisional debridement, head liner exchange, posterior approach (Right Hip)  Patient Location: PACU  Anesthesia Type:General  Level of Consciousness: awake, alert , oriented and patient cooperative  Airway & Oxygen Therapy: Patient Spontanous Breathing and Patient connected to face mask oxygen  Post-op Assessment: Report given to RN, Post -op Vital signs reviewed and stable and Patient moving all extremities X 4  Post vital signs: stable  Last Vitals:  Vitals Value Taken Time  BP 138/65 01/26/2018  6:30 PM  Temp    Pulse 85 01/26/2018  6:33 PM  Resp 18 01/26/2018  6:33 PM  SpO2 100 % 01/26/2018  6:33 PM  Vitals shown include unvalidated device data.  Last Pain:  Vitals:   01/26/18 1817  TempSrc:   PainSc: 9       Patients Stated Pain Goal: 4 (01/26/18 1330)  Complications: No apparent anesthesia complications

## 2018-01-26 NOTE — Interval H&P Note (Signed)
History and Physical Interval Note:  01/26/2018 3:38 PM  Travis Wolfe  has presented today for surgery, with the diagnosis of Infection of right total hip arthroplasty  The various methods of treatment have been discussed with the patient and family. After consideration of risks, benefits and other options for treatment, the patient has consented to  Procedure(s) with comments: Right hip irrigation and debridement, excisional and non excisional debridement, head liner exchange, posterior approach (Right) - 90 mins Would like to start earlier around 2:00pm if time opens as a surgical intervention .  The patient's history has been reviewed, patient examined, no change in status, stable for surgery.  I have reviewed the patient's chart and labs.  Questions were answered to the patient's satisfaction.     Shelda PalMatthew D Shiryl Ruddy

## 2018-01-26 NOTE — Progress Notes (Signed)
Pharmacy Antibiotic Note  Travis HeritageRobert L Wolfe is a 83 y.o. 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  Pharmacy has been consulted for vancomoycin dosing. Vancomycin powder 2 gm given in OR and ancef 2 gm given preop.   Plan: Vancomycin 1500 mg IV loading dose followed by vancomycin 1000 mg IV q24 for AUC 475 wt 63.5, (SCr 1, TBW/TBW) F/u renal function, WBC, temp, culture data Vancomycin levels as needed  Height: 5\' 8"  (172.7 cm) Weight: 140 lb (63.5 kg) IBW/kg (Calculated) : 68.4  Temp (24hrs), Avg:98.1 F (36.7 C), Min:97.8 F (36.6 C), Max:98.4 F (36.9 C)  Recent Labs  Lab 01/21/18 1425 01/26/18 1904  WBC 9.4 12.7*  CREATININE 0.69  --     Estimated Creatinine Clearance: 66.1 mL/min (by C-G formula based on SCr of 0.69 mg/dL).    No Known Allergies  Antimicrobials this admission: 8/12 vanc>> 8/12 ancef 1 dose preop and 2 doses post op>8/13 Dose adjustments this admission:  Microbiology results: 8/12 R hip tissue>> 8/7 MRSA PCR neg 8/7 MSSA PCR neg  Thank you for allowing pharmacy to be a part of this patient's care. Travis AbrahamMichelle T. Brystol Wolfe, Pharm.D [phone] 01/26/2018 9:05 PM

## 2018-01-26 NOTE — Discharge Instructions (Addendum)
INSTRUCTIONS AFTER JOINT REPLACEMENT  ° °o Remove items at home which could result in a fall. This includes throw rugs or furniture in walking pathways °o ICE to the affected joint every three hours while awake for 30 minutes at a time, for at least the first 3-5 days, and then as needed for pain and swelling.  Continue to use ice for pain and swelling. You may notice swelling that will progress down to the foot and ankle.  This is normal after surgery.  Elevate your leg when you are not up walking on it.   °o Continue to use the breathing machine you got in the hospital (incentive spirometer) which will help keep your temperature down.  It is common for your temperature to cycle up and down following surgery, especially at night when you are not up moving around and exerting yourself.  The breathing machine keeps your lungs expanded and your temperature down. ° ° °DIET:  As you were doing prior to hospitalization, we recommend a well-balanced diet. ° °DRESSING / WOUND CARE / SHOWERING ° °Keep the surgical dressing until follow up.  The dressing is water proof, so you can shower without any extra covering.  IF THE DRESSING FALLS OFF or the wound gets wet inside, change the dressing with sterile gauze.  Please use good hand washing techniques before changing the dressing.  Do not use any lotions or creams on the incision until instructed by your surgeon.   ° °ACTIVITY ° °o Increase activity slowly as tolerated, but follow the weight bearing instructions below.   °o No driving for 6 weeks or until further direction given by your physician.  You cannot drive while taking narcotics.  °o No lifting or carrying greater than 10 lbs. until further directed by your surgeon. °o Avoid periods of inactivity such as sitting longer than an hour when not asleep. This helps prevent blood clots.  °o You may return to work once you are authorized by your doctor.  ° ° ° °WEIGHT BEARING  ° °Weight bearing as tolerated with assist  device (walker, cane, etc) as directed, use it as long as suggested by your surgeon or therapist, typically at least 4-6 weeks. ° ° °EXERCISES ° °Results after joint replacement surgery are often greatly improved when you follow the exercise, range of motion and muscle strengthening exercises prescribed by your doctor. Safety measures are also important to protect the joint from further injury. Any time any of these exercises cause you to have increased pain or swelling, decrease what you are doing until you are comfortable again and then slowly increase them. If you have problems or questions, call your caregiver or physical therapist for advice.  ° °Rehabilitation is important following a joint replacement. After just a few days of immobilization, the muscles of the leg can become weakened and shrink (atrophy).  These exercises are designed to build up the tone and strength of the thigh and leg muscles and to improve motion. Often times heat used for twenty to thirty minutes before working out will loosen up your tissues and help with improving the range of motion but do not use heat for the first two weeks following surgery (sometimes heat can increase post-operative swelling).  ° °These exercises can be done on a training (exercise) mat, on the floor, on a table or on a bed. Use whatever works the best and is most comfortable for you.    Use music or television while you are exercising so that   the exercises are a pleasant break in your day. This will make your life better with the exercises acting as a break in your routine that you can look forward to.   Perform all exercises about fifteen times, three times per day or as directed.  You should exercise both the operative leg and the other leg as well. ° °Exercises include: °  °• Quad Sets - Tighten up the muscle on the front of the thigh (Quad) and hold for 5-10 seconds.   °• Straight Leg Raises - With your knee straight (if you were given a brace, keep it on),  lift the leg to 60 degrees, hold for 3 seconds, and slowly lower the leg.  Perform this exercise against resistance later as your leg gets stronger.  °• Leg Slides: Lying on your back, slowly slide your foot toward your buttocks, bending your knee up off the floor (only go as far as is comfortable). Then slowly slide your foot back down until your leg is flat on the floor again.  °• Angel Wings: Lying on your back spread your legs to the side as far apart as you can without causing discomfort.  °• Hamstring Strength:  Lying on your back, push your heel against the floor with your leg straight by tightening up the muscles of your buttocks.  Repeat, but this time bend your knee to a comfortable angle, and push your heel against the floor.  You may put a pillow under the heel to make it more comfortable if necessary.  ° °A rehabilitation program following joint replacement surgery can speed recovery and prevent re-injury in the future due to weakened muscles. Contact your doctor or a physical therapist for more information on knee rehabilitation.  ° ° °CONSTIPATION ° °Constipation is defined medically as fewer than three stools per week and severe constipation as less than one stool per week.  Even if you have a regular bowel pattern at home, your normal regimen is likely to be disrupted due to multiple reasons following surgery.  Combination of anesthesia, postoperative narcotics, change in appetite and fluid intake all can affect your bowels.  ° °YOU MUST use at least one of the following options; they are listed in order of increasing strength to get the job done.  They are all available over the counter, and you may need to use some, POSSIBLY even all of these options:   ° °Drink plenty of fluids (prune juice may be helpful) and high fiber foods °Colace 100 mg by mouth twice a day  °Senokot for constipation as directed and as needed Dulcolax (bisacodyl), take with full glass of water  °Miralax (polyethylene glycol)  once or twice a day as needed. ° °If you have tried all these things and are unable to have a bowel movement in the first 3-4 days after surgery call either your surgeon or your primary doctor.   ° °If you experience loose stools or diarrhea, hold the medications until you stool forms back up.  If your symptoms do not get better within 1 week or if they get worse, check with your doctor.  If you experience "the worst abdominal pain ever" or develop nausea or vomiting, please contact the office immediately for further recommendations for treatment. ° ° °ITCHING:  If you experience itching with your medications, try taking only a single pain pill, or even half a pain pill at a time.  You can also use Benadryl over the counter for itching or also to   help with sleep.   TED HOSE STOCKINGS:  Use stockings on both legs until for at least 2 weeks or as directed by physician office. They may be removed at night for sleeping.  MEDICATIONS:  See your medication summary on the After Visit Summary that nursing will review with you.  You may have some home medications which will be placed on hold until you complete the course of blood thinner medication.  It is important for you to complete the blood thinner medication as prescribed.  PRECAUTIONS:  If you experience chest pain or shortness of breath - call 911 immediately for transfer to the hospital emergency department.   If you develop a fever greater that 101 F, purulent drainage from wound, increased redness or drainage from wound, foul odor from the wound/dressing, or calf pain - CONTACT YOUR SURGEON.                                                   FOLLOW-UP APPOINTMENTS:  If you do not already have a post-op appointment, please call the office for an appointment to be seen by your surgeon.  Guidelines for how soon to be seen are listed in your After Visit Summary, but are typically between 1-4 weeks after surgery.  OTHER INSTRUCTIONS:   Knee  Replacement:  Do not place pillow under knee, focus on keeping the knee straight while resting.   MAKE SURE YOU:   Understand these instructions.   Get help right away if you are not doing well or get worse.    Thank you for letting us be a part of your medical care team.  It is a privilege we respect greatly.  We hope these instructions will help you stay on track for a fast and full recovery!    Information on my medicine - ELIQUIS (apixaban)  This medication education was reviewed with me or my healthcare representative as part of my discharge preparation.  The pharmacist that spoke with me during my hospital stay was:   Why was Eliquis prescribed for you? Eliquis was prescribed for you to reduce the risk of blood clots forming after orthopedic surgery.    What do You need to know about Eliquis? Take your Eliquis TWICE DAILY - one tablet in the morning and one tablet in the evening with or without food.  It would be best to take the dose about the same time each day.  If you have difficulty swallowing the tablet whole please discuss with your pharmacist how to take the medication safely.  Take Eliquis exactly as prescribed by your doctor and DO NOT stop taking Eliquis without talking to the doctor who prescribed the medication.  Stopping without other medication to take the place of Eliquis may increase your risk of developing a clot.  After discharge, you should have regular check-up appointments with your healthcare provider that is prescribing your Eliquis.  What do you do if you miss a dose? If a dose of ELIQUIS is not taken at the scheduled time, take it as soon as possible on the same day and twice-daily administration should be resumed.  The dose should not be doubled to make up for a missed dose.  Do not take more than one tablet of ELIQUIS at the same time.  Important Safety Information A possible side effect of Eliquis is  bleeding. You should call your  healthcare provider right away if you experience any of the following: ? Bleeding from an injury or your nose that does not stop. ? Unusual colored urine (red or dark brown) or unusual colored stools (red or black). ? Unusual bruising for unknown reasons. ? A serious fall or if you hit your head (even if there is no bleeding).  Some medicines may interact with Eliquis and might increase your risk of bleeding or clotting while on Eliquis. To help avoid this, consult your healthcare provider or pharmacist prior to using any new prescription or non-prescription medications, including herbals, vitamins, non-steroidal anti-inflammatory drugs (NSAIDs) and supplements.  This website has more information on Eliquis (apixaban): http://www.eliquis.com/eliquis/home

## 2018-01-26 NOTE — Anesthesia Procedure Notes (Signed)
Procedure Name: Intubation Date/Time: 01/26/2018 3:59 PM Performed by: Illene SilverEvans, Enedelia Martorelli E, CRNA Pre-anesthesia Checklist: Patient identified, Emergency Drugs available, Suction available and Patient being monitored Patient Re-evaluated:Patient Re-evaluated prior to induction Oxygen Delivery Method: Circle system utilized Preoxygenation: Pre-oxygenation with 100% oxygen Induction Type: IV induction Ventilation: Mask ventilation without difficulty Grade View: Grade I Tube type: Oral Tube size: 7.5 mm Number of attempts: 1 Airway Equipment and Method: Stylet and Oral airway Placement Confirmation: ETT inserted through vocal cords under direct vision,  positive ETCO2 and breath sounds checked- equal and bilateral Secured at: 21 cm Tube secured with: Tape Dental Injury: Teeth and Oropharynx as per pre-operative assessment

## 2018-01-27 ENCOUNTER — Other Ambulatory Visit: Payer: Self-pay

## 2018-01-27 DIAGNOSIS — Z9889 Other specified postprocedural states: Secondary | ICD-10-CM

## 2018-01-27 DIAGNOSIS — K219 Gastro-esophageal reflux disease without esophagitis: Secondary | ICD-10-CM

## 2018-01-27 DIAGNOSIS — D649 Anemia, unspecified: Secondary | ICD-10-CM

## 2018-01-27 DIAGNOSIS — I4891 Unspecified atrial fibrillation: Secondary | ICD-10-CM

## 2018-01-27 LAB — BASIC METABOLIC PANEL
ANION GAP: 6 (ref 5–15)
BUN: 22 mg/dL (ref 8–23)
CALCIUM: 7.6 mg/dL — AB (ref 8.9–10.3)
CO2: 23 mmol/L (ref 22–32)
Chloride: 110 mmol/L (ref 98–111)
Creatinine, Ser: 1.02 mg/dL (ref 0.61–1.24)
GFR calc Af Amer: >60 mL/min (ref >60)
GFR calc non Af Amer: >60 mL/min (ref >60)
Glucose, Bld: 193 mg/dL — ABNORMAL HIGH (ref 70–99)
Potassium: 4.3 mmol/L (ref 3.5–5.1)
SODIUM: 139 mmol/L (ref 135–145)

## 2018-01-27 LAB — CBC
HCT: 20.6 % — ABNORMAL LOW (ref 39.0–52.0)
Hemoglobin: 6.7 g/dL — CL (ref 13.0–17.0)
MCH: 31.3 pg (ref 26.0–34.0)
MCHC: 32.5 g/dL (ref 30.0–36.0)
MCV: 96.3 fL (ref 78.0–100.0)
PLATELETS: 256 10*3/uL (ref 150–400)
RBC: 2.14 MIL/uL — AB (ref 4.22–5.81)
RDW: 13.5 % (ref 11.5–15.5)
WBC: 9.9 10*3/uL (ref 4.0–10.5)

## 2018-01-27 LAB — PREPARE RBC (CROSSMATCH)

## 2018-01-27 MED ORDER — ZOLPIDEM TARTRATE 5 MG PO TABS
5.0000 mg | ORAL_TABLET | Freq: Every evening | ORAL | Status: DC | PRN
  Administered 2018-01-27 – 2018-01-28 (×2): 5 mg via ORAL
  Filled 2018-01-27 (×2): qty 1

## 2018-01-27 MED ORDER — SODIUM CHLORIDE 0.9% IV SOLUTION
Freq: Once | INTRAVENOUS | Status: AC
  Administered 2018-01-27: 10:00:00 via INTRAVENOUS

## 2018-01-27 MED ORDER — DORZOLAMIDE HCL-TIMOLOL MAL 2-0.5 % OP SOLN
1.0000 [drp] | Freq: Every day | OPHTHALMIC | Status: DC
  Administered 2018-01-27 – 2018-01-29 (×3): 1 [drp] via OPHTHALMIC
  Filled 2018-01-27 (×2): qty 10

## 2018-01-27 NOTE — Progress Notes (Signed)
Matt Babish PA noted of HGB 6.7

## 2018-01-27 NOTE — Consult Note (Addendum)
[facility]  Total days of antibiotics 2        Day 2 cefazolin/vanco              Reason for Consult: pji    Referring Physician: olin  Principal Problem:   Infection of right prosthetic hip joint ([place]) Active Problems:   S/P revision of total hip    HPI: Travis Wolfe is a 83 y.o. 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 Despite these modifications, the patient has had persistent swelling to naterior hip and posterior thigh. He has had aspiration of fluid in posterior thigh in June 2019 with negative cx. He was then seen by dr oAlvan Damein early July where they re-aspirated the fluid from his thigh that did not suggests any bacterial infection-though he was on abtx.2055mof dark fluid removed. MRI suggests large hematoma vs signs of metallosis associated with his hip implant. He is admitted now a month later for excisional and nonexcisional debridement of the right hip and revision of the femoral head of implant on 8/12. He did have repeat aspiration of the fluid on 7/19 which had cell count of 1,800 with 92%N, and CoNS (pan sensitive except R PCN) . New cultures have been sent from the OR. Dr OlAlvan Damelanning to treat as PJI due to recurrent of reaccumulated fluid. Patient has remained afebrile.   Past Medical History:  Diagnosis Date  . [place]-fib ([facility]   REPORTS WAS DUE TO DEHYDRATION 3 YEARS AGO BUT WAS GIVEN FLUIDS IN ED AND MEDS TO SLOW HR AND DROVE HIMSELF HOME  ; HAD ABLATION X2 WITH CARDIO DR. AKBARY AS PRECAUTION  ; has been on eliquis since but reports he has not taken it for 5 weeks b/c he was told to hold it for the multiple irrigations and debridements for the hip  infection. reports his cardiologist is aware he has been holding it;   . Arthritis   . Atherosclerosis of both carotid arteries   . BPH (benign prostatic hyperplasia)   . Fall    fell on friday 01-16-18;   lost balAnce whIile tryng to get his sock on ;sustained skin tear to left wrist ; no drainage  , scabbed over , and has been covering with bandage   . GERD (gastroesophageal reflux disease)   . Heart murmur    SINCE HIS 20s  . HLD (hyperlipidemia)   . Hypertension   . Non-rheumatic mitral regurgitation   . PVC (premature ventricular contraction)     Allergies: No Known Allergies   MEDICATIONS: . apixaban  2.5 mg Oral Q12H  . atorvastatin  40 mg Oral Daily  . celecoxib  200 mg Oral BID  . dexamethasone  10 mg Intravenous Once  . docusate sodium  100 mg Oral BID  . dorzolamide-timolol  1 drop Both Eyes Daily  . finasteride  5 mg Oral Daily  . flecainide  50 mg Oral BID  . iron polysaccharides  150 mg Oral Daily  . pantoprazole  80 mg Oral Daily  . polyethylene glycol  17 g Oral BID    Social History   Tobacco Use  . Smoking status: Never Smoker  .  Smokeless tobacco: Never Used  Substance Use Topics  . Alcohol use: Yes    Comment: socially   . Drug use: No    Family History  Problem Relation Age of Onset  . Hypertension Mother   . Cancer Mother      Review of Systems  Constitutional: Negative for fever, chills, diaphoresis, activity change, appetite change, fatigue and unexpected weight change.  HENT: Negative for congestion, sore throat, rhinorrhea, sneezing, trouble swallowing and sinus pressure.  Eyes: Negative for photophobia and visual disturbance.  Respiratory: Negative for cough, chest tightness, shortness of breath, wheezing and stridor.  Cardiovascular: Negative for chest pain, palpitations and leg swelling.  Gastrointestinal: Negative for nausea, vomiting, abdominal pain, diarrhea, constipation, blood in stool, abdominal distention and anal bleeding.    Genitourinary: Negative for dysuria, hematuria, flank pain and difficulty urinating.  Musculoskeletal: right hip pain  Skin: Negative for color change, pallor, rash and wound.  Neurological: Negative for dizziness, tremors, weakness and light-headedness.  Hematological: Negative for adenopathy. Does not bruise/bleed easily.  Psychiatric/Behavioral: Negative for behavioral problems, confusion, sleep disturbance, dysphoric mood, decreased concentration and agitation.     OBJECTIVE: Temp:  [97.8 F (36.6 C)-98.6 F (37 C)] 98.2 F (36.8 C) (08/13 1601) Pulse Rate:  [66-86] 69 (08/13 1601) Resp:  [12-19] 16 (08/13 1601) BP: (91-145)/(52-106) 104/53 (08/13 1601) SpO2:  [95 %-100 %] 100 % (08/13 1601) Weight:  [63.5 kg] 63.5 kg (08/13 0402) Physical Exam  Constitutional: He is oriented to person, place, and time. He appears well-developed and well-nourished. No distress.  HENT:  Mouth/Throat: Oropharynx is clear and moist. No oropharyngeal exudate.  Cardiovascular: Normal rate, regular rhythm and normal heart sounds. Exam reveals no gallop and no friction rub.  No murmur heard.  Pulmonary/Chest: Effort normal and breath sounds normal. No respiratory distress. He has no wheezes.  Abdominal: Soft. Bowel sounds are normal. He exhibits no distension. There is no tenderness.  Lymphadenopathy:  He has no cervical adenopathy.  Neurological: He is alert and oriented to person, place, and time.  Skin: Skin is warm and dry. No rash noted. No erythema.  Psychiatric: He has a normal mood and affect. His behavior is normal.     LABS: Results for orders placed or performed during the hospital encounter of 01/26/18 (from the past 48 hour(s))  Aerobic/Anaerobic Culture (surgical/deep wound)     Status: None (Preliminary result)   Collection Time: 01/26/18  4:34 PM  Result Value Ref Range   Specimen Description      TISSUE HIP RIGHT Performed at [facility] [address] [place], [place] [address]    Special Requests      NONE Performed at [facility], [place] [address], [place], [place] [address]    Gram Stain      ABUNDANT WBC PRESENT, PREDOMINANTLY PMN NO ORGANISMS SEEN Gram Stain Report Called to,Read Back By and Verified With: JESS GOOD,RN [phone] @ 7371 BY Martina Sinner Performed at [facility] [address], [place], [place] [address]    Culture      NO GROWTH < 24 HOURS Performed at [facility] [address] [place],  [address]    Report Status PENDING   Aerobic/Anaerobic Culture (surgical/deep wound)     Status: None (Preliminary result)   Collection Time: 01/26/18  4:37 PM  Result Value Ref Range   Specimen Description      TISSUE HIP RIGHT Performed at [facility],  [place] [address] [place], [place] [address]    Special Requests      NONE Performed at [facility], [place] [address], [place], [place] [address]    Gram Stain      ABUNDANT WBC PRESENT,BOTH PMN AND MONONUCLEAR NO ORGANISMS SEEN    Culture      NO GROWTH < 24 HOURS Performed at [facility] [address], [place], [place] [address]    Report Status PENDING   Aerobic/Anaerobic Culture (surgical/deep wound)     Status: None (Preliminary result)   Collection Time: 01/26/18  4:39 PM  Result Value Ref Range   Specimen Description      TISSUE HIP RIGHT Performed at [facility] [address] [place], [place] [address]    Special Requests      NONE Performed at [facility], [place] [address] [place], [place] [address]    Gram Stain      FEW WBC PRESENT, PREDOMINANTLY PMN NO ORGANISMS SEEN Gram Stain Report Called to,Read Back By and Verified With: JESS GOOD,RN [phone] @ [address] Performed at [facility] [address] [place], [place] [address]    Culture      NO GROWTH < 24 HOURS Performed at [facility] [address], [place], [place] [address]    Report Status PENDING   I-STAT 4, (NA,K, GLUC, HGB,HCT)     Status: Abnormal   Collection Time: 01/26/18  5:10 PM  Result Value Ref Range   Sodium 139 135 - 145 mmol/L   Potassium 3.6 3.5 - 5.1 mmol/L   Glucose, Bld 103 (H) 70 - 99 mg/dL   HCT 20.0 (L) 39.0 - 52.0 %   Hemoglobin 6.8 (LL) 13.0 - 17.0 g/dL  Hemoglobin and hematocrit, blood     Status: Abnormal   Collection Time: 01/26/18  5:15 PM  Result Value Ref Range   Hemoglobin 7.9 (L) 13.0 - 17.0 g/dL   HCT 23.8 (L) 39.0 - 52.0 %    Comment: Performed at [facility], [place] [address], [place], [place] [address]  CBC     Status: Abnormal   Collection Time: 01/26/18  7:04 PM  Result Value Ref Range   WBC 12.7 (H) 4.0 - 10.5 K/uL   RBC 2.45 (L) 4.22 - 5.81 MIL/uL   Hemoglobin 7.8 (L) 13.0 - 17.0 g/dL   HCT 23.8 (L) 39.0 - 52.0 %   MCV 97.1 78.0 - 100.0 fL   MCH 31.8 26.0 - 34.0 pg   MCHC 32.8 30.0 - 36.0 g/dL   RDW 13.5 11.5 - 15.5 %   Platelets 273 150 - 400 K/uL    Comment: Performed at [facility], [place] [address], [place],  [address]  CBC     Status: Abnormal   Collection Time: 01/27/18  5:27 AM  Result Value Ref Range   WBC 9.9 4.0 - 10.5 K/uL   RBC 2.14 (L) 4.22 - 5.81 MIL/uL   Hemoglobin 6.7 (LL) 13.0 - 17.0 g/dL    Comment: CRITICAL RESULT CALLED TO, READ BACK BY AND VERIFIED WITH: BRYSON,L. RN @0553  ON 8.13.19 BY NMCCOY    HCT 20.6 (L) 39.0 - 52.0 %   MCV 96.3 78.0 - 100.0 fL   MCH 31.3 26.0 - 34.0 pg   MCHC 32.5 30.0 - 36.0 g/dL   RDW 13.5 11.5 - 15.5 %   Platelets 256 150 -  400 K/uL    Comment: Performed at [facility], [place] [address], [place], [place] [address]  Basic metabolic panel     Status: Abnormal   Collection Time: 01/27/18  5:27 AM  Result Value Ref Range   Sodium 139 135 - 145 mmol/L   Potassium 4.3 3.5 - 5.1 mmol/L   Chloride 110 98 - 111 mmol/L   CO2 23 22 - 32 mmol/L     Glucose, Bld 193 (H) 70 - 99 mg/dL   BUN 22 8 - 23 mg/dL   Creatinine, Ser 1.02 0.61 - 1.24 mg/dL   Calcium 7.6 (L) 8.9 - 10.3 mg/dL   GFR calc non Af Amer >60 >60 mL/min   GFR calc Af Amer >60 >60 mL/min    Comment: (NOTE) The eGFR has been calculated using the CKD EPI equation. This calculation has not been validated in all clinical situations. eGFR's persistently <60 mL/min signify possible Chronic Kidney Disease.    Anion gap 6 5 - 15    Comment: Performed at [facility], [place] [address], [place], [place] [address]  Prepare RBC     Status: None   Collection Time: 01/27/18  8:03 AM  Result Value Ref Range   Order Confirmation      ORDER PROCESSED BY BLOOD BANK Performed at [facility], [place] [address] [place], [place] [address]     MICRO: No growth thus far IMAGING: Dg Pelvis Portable  Result Date: 01/26/2018 CLINICAL DATA:  Status post right hip replacement EXAM: PORTABLE PELVIS 1-2 VIEWS COMPARISON:  12/09/2017 FINDINGS: Right hip prosthesis is noted in satisfactory position. Chronic changes along the medial aspect of the ischium are seen portion may be related to the recent debridement. Postsurgical changes in the lumbosacral spine are noted. The proximal femur is intact. Surgical drains are noted. IMPRESSION: Status post hip revision and debridement. Electronically Signed   By: Inez Catalina M.D.   On: 01/26/2018 19:20   [place] Ekg Site Rite  Result Date: 01/26/2018 If Site Rite image not attached, placement could not be confirmed due to current cardiac rhythm.   Assessment/Plan:  82 with complicated hx of right THA requiring multiple revisions concerning for infectious process. Recent aspiration isolated CoNS. POD#1 s/p debridement and revision of THA.   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 Await culture results- then will likely decide of  final recs. 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

## 2018-01-27 NOTE — Evaluation (Signed)
Physical Therapy Evaluation Patient Details Name: Travis HeritageRobert L Wolfe MRN: [id number] DOB: 10-03-1934 Today's Date: 01/27/2018   History of Present Illness  Pt is an 83 year old male s/p Right hip irrigation and debridement, excisional and non excisional debridement, head liner revision with posterior approach on 01/26/18 and hx of multiple hip and back surgeries.  Clinical Impression  Patient is s/p above surgery resulting in functional limitations due to the deficits listed below (see PT Problem List).  Patient will benefit from skilled PT to increase their independence and safety with mobility to allow discharge to the venue listed below.  Pt receiving first unit of PRBCs so assisted OOB to recliner at this time.  Pt reports he is very familiar with posterior hip precautions from previous hip surgeries.  Pt states his wife is a Char[employer] fundraiserN and will be assisting him at home.       Follow Up Recommendations Follow surgeon's recommendation for DC plan and follow-up therapies    Equipment Recommendations  None recommended by PT    Recommendations for Other Services       Precautions / Restrictions Precautions Precautions: Fall;Posterior Hip Precaution Comments: pt reports he is familiar with precautions due to numerous hip surgeries, provided posterior hip precautions handout Restrictions Other Position/Activity Restrictions: WBAT      Mobility  Bed Mobility Overal bed mobility: Needs Assistance Bed Mobility: Supine to Sit     Supine to sit: Min guard;HOB elevated     General bed mobility comments: pt self assisted R LE using UEs, aware of precautions  Transfers Overall transfer level: Needs assistance Equipment used: Rolling walker (2 wheeled) Transfers: Sit to/from UGI CorporationStand;Stand Pivot Transfers Sit to Stand: Min assist Stand pivot transfers: Min assist       General transfer comment: assist to rise and steady, verbal cue for R LE forward  Ambulation/Gait                 Stairs            Wheelchair Mobility    Modified Rankin (Stroke Patients Only)       Balance Overall balance assessment: History of Falls(pt reports fall earlier this month)                                           Pertinent Vitals/Pain Pain Assessment: No/denies pain    Home Living Family/patient expects to be discharged to:: Private residence Living Arrangements: Spouse/significant other Available Help at Discharge: Family;Available 24 hours/day Type of Home: House Home Access: Stairs to enter Entrance Stairs-Rails: None Entrance Stairs-Number of Steps: 2 Home Layout: Multi-level;Able to live on main level with bedroom/bathroom Home Equipment: Walker - 2 wheels;Grab bars - tub/shower;Shower seat;Cane - single point      Prior Function Level of Independence: Independent               Hand Dominance        Extremity/Trunk Assessment        Lower Extremity Assessment Lower Extremity Assessment: RLE deficits/detail RLE Deficits / Details: anticipated post op hip weakness, pt self assists R LE, able to perform ankle pumps       Communication   Communication: No difficulties  Cognition Arousal/Alertness: Awake/alert Behavior During Therapy: WFL for tasks assessed/performed Overall Cognitive Status: Within Functional Limits for tasks assessed  General Comments      Exercises     Assessment/Plan    PT Assessment Patient needs continued PT services  PT Problem List Decreased strength;Decreased mobility;Decreased balance;Pain       PT Treatment Interventions Stair training;Gait training;Therapeutic exercise;Therapeutic activities;DME instruction;Patient/family education;Functional mobility training    PT Goals (Current goals can be found in the Care Plan section)  Acute Rehab PT Goals PT Goal Formulation: With patient Time For Goal Achievement: 01/31/18 Potential  to Achieve Goals: Good    Frequency 7X/week   Barriers to discharge        Co-evaluation               AM-PAC PT "6 Clicks" Daily Activity  Outcome Measure Difficulty turning over in bed (including adjusting bedclothes, sheets and blankets)?: None Difficulty moving from lying on back to sitting on the side of the bed? : A Lot Difficulty sitting down on and standing up from a chair with arms (e.g., wheelchair, bedside commode, etc,.)?: Unable Help needed moving to and from a bed to chair (including a wheelchair)?: A Little Help needed walking in hospital room?: A Little Help needed climbing 3-5 steps with a railing? : A Lot 6 Click Score: 15    End of Session Equipment Utilized During Treatment: Gait belt Activity Tolerance: Patient tolerated treatment well Patient left: in chair;with call bell/phone within reach;with chair alarm set Nurse Communication: Mobility status PT Visit Diagnosis: Other abnormalities of gait and mobility (R26.89)    Time: 1127-1202 PT Time Calculation (min) (ACUTE ONLY): 35 min   Charges:   PT Evaluation $PT Eval Low Complexity: 1 Low         Zenovia JarredKati Orchid Glassberg, PT, DPT 01/27/2018 Pager: [phone]  Maida SaleLEMYRE,[name] 01/27/2018, 12:42 PM

## 2018-01-27 NOTE — Progress Notes (Signed)
Physical Therapy Treatment Patient Details Name: Travis HeritageRobert L Wolfe MRN: [id number] DOB: Feb 04, 1935 Today's Date: 01/27/2018    History of Present Illness Pt is an 83 year old male s/p Right hip irrigation and debridement, excisional and non excisional debridement, head liner revision with posterior approach on 01/26/18 and hx of multiple hip and back surgeries.    PT Comments    Pt assisted with ambulating in hallway, tolerated short distance, and then back to bed.  Upon return to bed, lunch tray positioned for pt.  Follow Up Recommendations  Follow surgeon's recommendation for DC plan and follow-up therapies     Equipment Recommendations  None recommended by PT    Recommendations for Other Services       Precautions / Restrictions Precautions Precautions: Fall;Posterior Hip Precaution Comments: pt reports he is familiar with precautions due to numerous hip surgeries, provided posterior hip precautions handout Restrictions Other Position/Activity Restrictions: WBAT    Mobility  Bed Mobility Overal bed mobility: Needs Assistance Bed Mobility: Sit to Supine     Supine to sit: Min guard;HOB elevated Sit to supine: Min guard;HOB elevated   General bed mobility comments: pt self assisted R LE using UEs, aware of precautions  Transfers Overall transfer level: Needs assistance Equipment used: Rolling walker (2 wheeled) Transfers: Sit to/from Stand Sit to Stand: Min assist Stand pivot transfers: Min assist       General transfer comment: assist to rise and steady, verbal cue for R LE forward  Ambulation/Gait Ambulation/Gait assistance: Min guard;Min assist Gait Distance (Feet): 60 Feet Assistive device: Rolling walker (2 wheeled) Gait Pattern/deviations: Step-to pattern;Decreased stance time - right;Antalgic     General Gait Details: verbal cues for sequence and step length, initial min assist however improved to min/guard   Stairs             Wheelchair  Mobility    Modified Rankin (Stroke Patients Only)       Balance Overall balance assessment: History of Falls(pt reports fall earlier this month)                                          Cognition Arousal/Alertness: Awake/alert Behavior During Therapy: WFL for tasks assessed/performed Overall Cognitive Status: Within Functional Limits for tasks assessed                                        Exercises      General Comments        Pertinent Vitals/Pain Pain Assessment: 0-10 Pain Score: 3  Pain Location: R hip Pain Descriptors / Indicators: Sore Pain Intervention(s): Limited activity within patient's tolerance;Repositioned;Monitored during session;Ice applied    Home Living Family/patient expects to be discharged to:: Private residence Living Arrangements: Spouse/significant other Available Help at Discharge: Family;Available 24 hours/day Type of Home: House Home Access: Stairs to enter Entrance Stairs-Rails: None Home Layout: Multi-level;Able to live on main level with bedroom/bathroom Home Equipment: Walker - 2 wheels;Grab bars - tub/shower;Shower seat;Cane - single point      Prior Function Level of Independence: Independent          PT Goals (current goals can now be found in the care plan section) Acute Rehab PT Goals PT Goal Formulation: With patient Time For Goal Achievement: 01/31/18 Potential to Achieve Goals: Good Progress towards  PT goals: Progressing toward goals    Frequency    7X/week      PT Plan Current plan remains appropriate    Co-evaluation              AM-PAC PT "6 Clicks" Daily Activity  Outcome Measure  Difficulty turning over in bed (including adjusting bedclothes, sheets and blankets)?: None Difficulty moving from lying on back to sitting on the side of the bed? : A Little Difficulty sitting down on and standing up from a chair with arms (e.g., wheelchair, bedside commode, etc,.)?: A  Little Help needed moving to and from a bed to chair (including a wheelchair)?: A Little Help needed walking in hospital room?: A Little Help needed climbing 3-5 steps with a railing? : A Little 6 Click Score: 19    End of Session Equipment Utilized During Treatment: Gait belt Activity Tolerance: Patient tolerated treatment well Patient left: in bed;with call bell/phone within reach;with bed alarm set Nurse Communication: Mobility status PT Visit Diagnosis: Other abnormalities of gait and mobility (R26.89)     Time: [id number] PT Time Calculation (min) (ACUTE ONLY): 16 min  Charges:  $Gait Training: 8-22 mins                     Zenovia JarredKati Cailen Mihalik, PT, DPT 01/27/2018 Pager: [phone]  Maida SaleLEMYRE,[name] 01/27/2018, 3:19 PM

## 2018-01-27 NOTE — Progress Notes (Signed)
Patient ID: Travis HeritageRobert L Jagoda, male   DOB: Mar 12, 1935, 83 y.o.   MRN: [id number] Subjective: 1 Day Post-Op Procedure(s) (LRB): Right hip irrigation and debridement, excisional and non excisional debridement, head liner exchange, posterior approach (Right)    Patient reports pain as mild to moderate but didn't sleep at all last night, typically uses lunesta as sleep aid  Objective:   VITALS:   Vitals:   01/27/18 0626 01/27/18 0929  BP: 104/63 (!) 107/57  Pulse: 68 71  Resp: 17 12  Temp: 98 F (36.7 C) 98 F (36.7 C)  SpO2: 100% 100%    Neurovascular intact Incision: dressing C/D/I  Drains in place and functioning  LABS Recent Labs    01/26/18 1715 01/26/18 1904 01/27/18 0527  HGB 7.9* 7.8* 6.7*  HCT 23.8* 23.8* 20.6*  WBC  --  12.7* 9.9  PLT  --  273 256    Recent Labs    01/26/18 1710 01/27/18 0527  NA 139 139  K 3.6 4.3  BUN  --  22  CREATININE  --  1.02  GLUCOSE 103* 193*    No results for input(s): LABPT, INR in the last 72 hours.   Assessment/Plan: 1 Day Post-Op Procedure(s) (LRB): Right hip irrigation and debridement, excisional and non excisional debridement, head liner exchange, posterior approach (Right)   Advance diet Up with therapy  PIC line ID consult for right hip aspirate from couple weeks ago Reviewed intra-operative findings WBAT Posterior hip precautions Plan for home discharge in 2-3 days

## 2018-01-27 NOTE — Anesthesia Postprocedure Evaluation (Signed)
Anesthesia Post Note  Patient: Travis HeritageRobert L Wolfe  Procedure(s) Performed: Right hip irrigation and debridement, excisional and non excisional debridement, head liner exchange, posterior approach (Right Hip)     Patient location during evaluation: PACU Anesthesia Type: General Level of consciousness: awake and alert Pain management: pain level controlled Vital Signs Assessment: post-procedure vital signs reviewed and stable Respiratory status: spontaneous breathing, nonlabored ventilation and respiratory function stable Cardiovascular status: blood pressure returned to baseline and stable Postop Assessment: no apparent nausea or vomiting Anesthetic complications: no    Last Vitals:  Vitals:   01/26/18 2204 01/26/18 2308  BP: 99/69 99/66  Pulse: 74 78  Resp: 17 16  Temp: 37 C 36.9 C  SpO2: 100% 100%    Last Pain:  Vitals:   01/26/18 2308  TempSrc: Oral  PainSc:                  Beryle Lathehomas E Shemeika Starzyk

## 2018-01-27 NOTE — Progress Notes (Signed)
[facility]  [facility] Infusion Coordinator will follow pt with ID team to provide Home Infusion Pharmacy services at DC as needed for home IVABX.  If patient discharges after hours, please call [phone].   Sedalia Mutaamela S Chandler 01/27/2018, 11:48 PM

## 2018-01-28 ENCOUNTER — Encounter (HOSPITAL_COMMUNITY): Payer: Self-pay | Admitting: Orthopedic Surgery

## 2018-01-28 LAB — CBC
HCT: 24.2 % — ABNORMAL LOW (ref 39.0–52.0)
Hemoglobin: 8.5 g/dL — ABNORMAL LOW (ref 13.0–17.0)
MCH: 31.5 pg (ref 26.0–34.0)
MCHC: 35.1 g/dL (ref 30.0–36.0)
MCV: 89.6 fL (ref 78.0–100.0)
Platelets: 262 10*3/uL (ref 150–400)
RBC: 2.7 MIL/uL — AB (ref 4.22–5.81)
RDW: 15.3 % (ref 11.5–15.5)
WBC: 13.1 10*3/uL — ABNORMAL HIGH (ref 4.0–10.5)

## 2018-01-28 LAB — BASIC METABOLIC PANEL
Anion gap: 4 — ABNORMAL LOW (ref 5–15)
BUN: 24 mg/dL — AB (ref 8–23)
CO2: 23 mmol/L (ref 22–32)
CREATININE: 0.93 mg/dL (ref 0.61–1.24)
Calcium: 8.1 mg/dL — ABNORMAL LOW (ref 8.9–10.3)
Chloride: 112 mmol/L — ABNORMAL HIGH (ref 98–111)
GFR calc Af Amer: >60 mL/min (ref >60)
Glucose, Bld: 150 mg/dL — ABNORMAL HIGH (ref 70–99)
POTASSIUM: 4.5 mmol/L (ref 3.5–5.1)
Sodium: 139 mmol/L (ref 135–145)

## 2018-01-28 LAB — TYPE AND SCREEN
ABO/RH(D): A POS
ANTIBODY SCREEN: NEGATIVE
Unit division: 0
Unit division: 0

## 2018-01-28 LAB — BPAM RBC
BLOOD PRODUCT EXPIRATION DATE: 201909032359
Blood Product Expiration Date: 201909032359
ISSUE DATE / TIME: 201908130943
ISSUE DATE / TIME: 201908131232
Unit Type and Rh: 6200
Unit Type and Rh: 6200

## 2018-01-28 MED ORDER — VANCOMYCIN HCL IN DEXTROSE 1-5 GM/200ML-% IV SOLN
1000.0000 mg | INTRAVENOUS | Status: DC
  Administered 2018-01-28: 1000 mg via INTRAVENOUS
  Filled 2018-01-28: qty 200

## 2018-01-28 MED ORDER — SODIUM CHLORIDE 0.9% FLUSH
10.0000 mL | INTRAVENOUS | Status: DC | PRN
  Administered 2018-01-29: 10 mL
  Filled 2018-01-28: qty 40

## 2018-01-28 NOTE — Progress Notes (Signed)
Physical Therapy Treatment Patient Details Name: Travis HeritageRobert L Garman MRN: [id number] DOB: 1935-01-27 Today's Date: 01/28/2018    History of Present Illness Pt is an 83 year old male s/p Right hip irrigation and debridement, excisional and non excisional debridement, head liner revision with posterior approach on 01/26/18 and hx of multiple hip and back surgeries.    PT Comments    Pt ambulated in hallway again this afternoon.  Pt is progressing well with mobility and maintains posterior hip precautions well.  Pt plans to practice steps tomorrow prior to d/c.   Follow Up Recommendations  Follow surgeon's recommendation for DC plan and follow-up therapies     Equipment Recommendations  None recommended by PT    Recommendations for Other Services       Precautions / Restrictions Precautions Precautions: Fall;Posterior Hip Precaution Comments: pt reports he is familiar with precautions due to numerous hip surgeries Restrictions Other Position/Activity Restrictions: WBAT    Mobility  Bed Mobility Overal bed mobility: Needs Assistance Bed Mobility: Supine to Sit     Supine to sit: Min guard;HOB elevated Sit to supine: Min assist   General bed mobility comments: pt self assisted R LE using UEs, aware of precautions; pt requested assist for LE onto bed due to fatigue  Transfers Overall transfer level: Needs assistance Equipment used: Rolling walker (2 wheeled) Transfers: Sit to/from Stand Sit to Stand: Min guard         General transfer comment: verbal cue for R LE forward  Ambulation/Gait Ambulation/Gait assistance: Min guard Gait Distance (Feet): 350 Feet Assistive device: Rolling walker (2 wheeled) Gait Pattern/deviations: Decreased stance time - right;Antalgic;Step-through pattern     General Gait Details: verbal cues for sequence and step length, improved to step through pattern, tolerated improved distance   Stairs             Wheelchair Mobility     Modified Rankin (Stroke Patients Only)       Balance                                            Cognition Arousal/Alertness: Awake/alert Behavior During Therapy: WFL for tasks assessed/performed Overall Cognitive Status: Within Functional Limits for tasks assessed                                        Exercises     General Comments        Pertinent Vitals/Pain Pain Assessment: 0-10 Pain Score: 4  Pain Location: R hip Pain Descriptors / Indicators: Sore Pain Intervention(s): Limited activity within patient's tolerance;Monitored during session;Ice applied;Repositioned    Home Living                      Prior Function            PT Goals (current goals can now be found in the care plan section) Progress towards PT goals: Progressing toward goals    Frequency    7X/week      PT Plan Current plan remains appropriate    Co-evaluation              AM-PAC PT "6 Clicks" Daily Activity  Outcome Measure  Difficulty turning over in bed (including adjusting bedclothes, sheets and blankets)?: None Difficulty moving from  lying on back to sitting on the side of the bed? : A Little Difficulty sitting down on and standing up from a chair with arms (e.g., wheelchair, bedside commode, etc,.)?: A Little Help needed moving to and from a bed to chair (including a wheelchair)?: A Little Help needed walking in hospital room?: A Little Help needed climbing 3-5 steps with a railing? : A Little 6 Click Score: 19    End of Session Equipment Utilized During Treatment: Gait belt Activity Tolerance: Patient tolerated treatment well Patient left: with family/visitor present;in bed;with call bell/phone within reach   PT Visit Diagnosis: Other abnormalities of gait and mobility (R26.89)     Time: 1343-1400 PT Time Calculation (min) (ACUTE ONLY): 17 min  Charges:  $Gait Training: 8-22 mins                      Zenovia JarredKati  Sameerah Nachtigal, PT, DPT 01/28/2018 Pager: [phone]  Maida SaleLEMYRE,[name] 01/28/2018, 2:06 PM

## 2018-01-28 NOTE — Progress Notes (Signed)
     Subjective: 2 Days Post-Op Procedure(s) (LRB): Right hip irrigation and debridement, excisional and non excisional debridement, head liner exchange, posterior approach (Right)   Patient reports pain as mild, better than is was prior to surgery.  No reported events throughout the night.  Patient just getting PICC line placed today.  Waiting on cultures and ID for final recommendation for antibiotic treatment.   Objective:   VITALS:   Vitals:   01/27/18 1601 01/28/18 0516  BP: (!) 104/53 (!) 104/54  Pulse: 69 63  Resp: 16 16  Temp: 98.2 F (36.8 C) 98.1 F (36.7 C)  SpO2: 100% 99%    Dorsiflexion/Plantar flexion intact Incision: dressing C/D/I No cellulitis present Compartment soft  LABS Recent Labs    01/26/18 1904 01/27/18 0527 01/28/18 0521  HGB 7.8* 6.7* 8.5*  HCT 23.8* 20.6* 24.2*  WBC 12.7* 9.9 13.1*  PLT 273 256 262    Recent Labs    01/26/18 1710 01/27/18 0527 01/28/18 0521  NA 139 139 139  K 3.6 4.3 4.5  BUN  --  22 24*  CREATININE  --  1.02 0.93  GLUCOSE 103* 193* 150*     Assessment/Plan: 2 Days Post-Op Procedure(s) (LRB): Right hip irrigation and debridement, excisional and non excisional debridement, head liner exchange, posterior approach (Right)   Up with therapy Discharge home with home health eventually, when ready   Anastasio AuerbachMatthew S. Loa Idler   PAC  01/28/2018, 9:12 AM

## 2018-01-28 NOTE — Progress Notes (Signed)
Peripherally Inserted Central Catheter/Midline Placement  The IV Nurse has discussed with the patient and/or persons authorized to consent for the patient, the purpose of this procedure and the potential benefits and risks involved with this procedure.  The benefits include less needle sticks, lab draws from the catheter, and the patient may be discharged home with the catheter. Risks include, but not limited to, infection, bleeding, blood clot (thrombus formation), and puncture of an artery; nerve damage and irregular heartbeat and possibility to perform a PICC exchange if needed/ordered by physician.  Alternatives to this procedure were also discussed.  Bard Power PICC patient education guide, fact sheet on infection prevention and patient information card has been provided to patient /or left at bedside.    PICC/Midline Placement Documentation  PICC Single Lumen 01/28/18 PICC Right Brachial 36 cm 0 cm (Active)  Indication for Insertion or Continuance of Line Home intravenous therapies (PICC only) 01/28/2018 10:11 AM  Exposed Catheter (cm) 0 cm 01/28/2018 10:11 AM  Site Assessment Clean;Dry;Intact 01/28/2018 10:11 AM  Line Status Flushed;Blood return noted;Saline locked 01/28/2018 10:11 AM  Dressing Type Transparent 01/28/2018 10:11 AM  Dressing Status Clean;Dry;Intact 01/28/2018 10:11 AM  Dressing Change Due 02/04/18 01/28/2018 10:11 AM       Audrie GallusByerly, Oaklan Persons Ramos 01/28/2018, 10:13 AM

## 2018-01-28 NOTE — Progress Notes (Signed)
Physical Therapy Treatment Patient Details Name: Travis HeritageRobert L Salts MRN: [id number] DOB: June 15, 1935 Today's Date: 01/28/2018    History of Present Illness Pt is an 83 year old male s/p Right hip irrigation and debridement, excisional and non excisional debridement, head liner revision with posterior approach on 01/26/18 and hx of multiple hip and back surgeries.    PT Comments    Pt ambulated in hallway and performed LE exercises.  Pt with PICC line placement this morning and Home Care Rep in at end of session for PICC home care teaching.  Pt reports plan for d/c home tomorrow.  Follow Up Recommendations  Follow surgeon's recommendation for DC plan and follow-up therapies     Equipment Recommendations  None recommended by PT    Recommendations for Other Services       Precautions / Restrictions Precautions Precautions: Fall;Posterior Hip Precaution Comments: pt reports he is familiar with precautions due to numerous hip surgeries Restrictions Weight Bearing Restrictions: No Other Position/Activity Restrictions: WBAT    Mobility  Bed Mobility Overal bed mobility: Needs Assistance Bed Mobility: Supine to Sit     Supine to sit: Min guard;HOB elevated     General bed mobility comments: pt self assisted R LE using UEs, aware of precautions  Transfers Overall transfer level: Needs assistance Equipment used: Rolling walker (2 wheeled) Transfers: Sit to/from Stand Sit to Stand: Min guard         General transfer comment: verbal cue for R LE forward  Ambulation/Gait Ambulation/Gait assistance: Min guard Gait Distance (Feet): 120 Feet Assistive device: Rolling walker (2 wheeled) Gait Pattern/deviations: Step-to pattern;Decreased stance time - right;Antalgic     General Gait Details: verbal cues for sequence and step length   Stairs             Wheelchair Mobility    Modified Rankin (Stroke Patients Only)       Balance                                             Cognition Arousal/Alertness: Awake/alert Behavior During Therapy: WFL for tasks assessed/performed Overall Cognitive Status: Within Functional Limits for tasks assessed                                        Exercises Total Joint Exercises Ankle Circles/Pumps: AROM;Both;10 reps Quad Sets: AROM;10 reps;Both Short Arc Quad: AROM;10 reps;Right Heel Slides: AAROM;10 reps;Right(within precautions) Hip ABduction/ADduction: AAROM;10 reps;Right    General Comments        Pertinent Vitals/Pain Pain Assessment: 0-10 Pain Score: 5  Pain Location: R hip Pain Descriptors / Indicators: Sore Pain Intervention(s): Repositioned;Limited activity within patient's tolerance;Monitored during session    Home Living                      Prior Function            PT Goals (current goals can now be found in the care plan section) Progress towards PT goals: Progressing toward goals    Frequency    7X/week      PT Plan Current plan remains appropriate    Co-evaluation              AM-PAC PT "6 Clicks" Daily Activity  Outcome Measure  Difficulty turning  over in bed (including adjusting bedclothes, sheets and blankets)?: None Difficulty moving from lying on back to sitting on the side of the bed? : A Little Difficulty sitting down on and standing up from a chair with arms (e.g., wheelchair, bedside commode, etc,.)?: A Little Help needed moving to and from a bed to chair (including a wheelchair)?: A Little Help needed walking in hospital room?: A Little Help needed climbing 3-5 steps with a railing? : A Little 6 Click Score: 19    End of Session Equipment Utilized During Treatment: Gait belt Activity Tolerance: Patient tolerated treatment well Patient left: with call bell/phone within reach;in chair;with family/visitor present   PT Visit Diagnosis: Other abnormalities of gait and mobility (R26.89)     Time:  4782-95621100-1123 PT Time Calculation (min) (ACUTE ONLY): 23 min  Charges:  $Gait Training: 8-22 mins $Therapeutic Exercise: 8-22 mins                    Zenovia JarredKati Madisen Ludvigsen, PT, DPT 01/28/2018 Pager: [phone]  Maida SaleLEMYRE,[name] 01/28/2018, 11:54 AM

## 2018-01-28 NOTE — Care Management Note (Signed)
Case Management Note  Patient Details  Name: Travis Wolfe MRN: [id number] Date of Birth: Mar 21, 1935  Subjective/Objective:         Patient will need IV abx at home, [facility]           Action/Plan: Contacted [facility] for referral, they have accepted.   Expected Discharge Date:  01/29/18               Expected Discharge Plan:  Home w Home Health Services  In-House Referral:  NA  Discharge planning Services  CM Consult  Post Acute Care Choice:  Home Health Choice offered to:  Patient  DME Arranged:  N/A DME Agency:  NA  HH Arranged:  RN HH Agency:  [facility]  Status of Service:  Completed, signed off  If discussed at Long Length of Stay Meetings, dates discussed:    Additional Comments:  Alexis Goodelleele, Marea Reasner K, RN 01/28/2018, 10:33 AM

## 2018-01-29 DIAGNOSIS — Z95828 Presence of other vascular implants and grafts: Secondary | ICD-10-CM

## 2018-01-29 DIAGNOSIS — T8451XD Infection and inflammatory reaction due to internal right hip prosthesis, subsequent encounter: Secondary | ICD-10-CM

## 2018-01-29 LAB — SEDIMENTATION RATE: SED RATE: 16 mm/h (ref 0–16)

## 2018-01-29 MED ORDER — HEPARIN SOD (PORK) LOCK FLUSH 100 UNIT/ML IV SOLN
250.0000 [IU] | INTRAVENOUS | Status: AC | PRN
  Administered 2018-01-29: 250 [IU]

## 2018-01-29 MED ORDER — ASPIRIN 81 MG PO CHEW: 81.0000 mg | CHEWABLE_TABLET | Freq: Two times a day (BID) | ORAL | 0 refills | Status: AC

## 2018-01-29 MED ORDER — VANCOMYCIN HCL IN DEXTROSE 1-5 GM/200ML-% IV SOLN
1000.0000 mg | INTRAVENOUS | Status: DC
  Administered 2018-01-29: 1000 mg via INTRAVENOUS
  Filled 2018-01-29: qty 200

## 2018-01-29 MED ORDER — DOCUSATE SODIUM 100 MG PO CAPS: 100.0000 mg | ORAL_CAPSULE | Freq: Two times a day (BID) | ORAL | 0 refills | Status: DC

## 2018-01-29 MED ORDER — HYDROCODONE-ACETAMINOPHEN 7.5-325 MG PO TABS: 1.0000 | ORAL_TABLET | ORAL | 0 refills | Status: DC | PRN

## 2018-01-29 MED ORDER — POLYETHYLENE GLYCOL 3350 17 G PO PACK: 17.0000 g | PACK | Freq: Two times a day (BID) | ORAL | 0 refills | Status: DC

## 2018-01-29 MED ORDER — CEFTRIAXONE IV (FOR PTA / DISCHARGE USE ONLY): 2.0000 g | INTRAVENOUS | 0 refills | Status: DC

## 2018-01-29 MED ORDER — METHOCARBAMOL 500 MG PO TABS: 500.0000 mg | ORAL_TABLET | Freq: Four times a day (QID) | ORAL | 0 refills | Status: DC | PRN

## 2018-01-29 MED ORDER — CEFTRIAXONE SODIUM 2 G IJ SOLR
2.0000 g | INTRAMUSCULAR | Status: DC
  Administered 2018-01-29: 2 g via INTRAVENOUS
  Filled 2018-01-29: qty 2

## 2018-01-29 MED ORDER — VANCOMYCIN IV (FOR PTA / DISCHARGE USE ONLY): 1000.0000 mg | INTRAVENOUS | 0 refills | Status: DC

## 2018-01-29 NOTE — Care Management Important Message (Signed)
Important Message  Patient Details  Name: Travis Wolfe MRN: [id number] Date of Birth: [birth year]   Medicare Important Message Given:  Yes    Caren MacadamFuller, Judy Goodenow 01/29/2018, 10:59 AMImportant Message  Patient Details  Name: Travis HeritageRobert L Wolfe MRN: [id number] Date of Birth: [birth year]   Medicare Important Message Given:  Yes    Caren MacadamFuller, Nasya Vincent 01/29/2018, 10:58 AM

## 2018-01-29 NOTE — Progress Notes (Addendum)
[place] for Infectious Disease    Date of Admission:  01/26/2018   Total days of antibiotics 4        Day 4 vanco           ID: Travis Wolfe is a 83 y.o. 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 Drains removed this morning.  Medications:  . apixaban  2.5 mg Oral Q12H  . atorvastatin  40 mg Oral Daily  . celecoxib  200 mg Oral BID  . docusate sodium  100 mg Oral BID  . dorzolamide-timolol  1 drop Both Eyes Daily  . finasteride  5 mg Oral Daily  . flecainide  50 mg Oral BID  . iron polysaccharides  150 mg Oral Daily  . pantoprazole  80 mg Oral Daily  . polyethylene glycol  17 g Oral BID    Objective: Vital signs in last 24 hours: Temp:  [98.2 F (36.8 C)-98.4 F (36.9 C)] 98.2 F (36.8 C) (08/15 0450) Pulse Rate:  [66-73] 73 (08/15 0450) Resp:  [15-16] 16 (08/15 0450) BP: (129-130)/(62-67) 130/62 (08/15 0450) SpO2:  [100 %] 100 % (08/15 0450) Physical Exam  Constitutional: He is oriented to person, place, and time. He appears well-developed and well-nourished. No distress.  HENT:  Mouth/Throat: Oropharynx is clear and moist. No oropharyngeal exudate.  Cardiovascular: Normal rate, regular rhythm and normal heart sounds. Exam reveals no gallop and no friction rub.  No murmur heard.  Pulmonary/Chest: Effort normal and breath sounds normal. No respiratory distress. He has no wheezes.  Abdominal: Soft. Bowel sounds are normal. He exhibits no distension. There is no tenderness.  Hip= bandaged, mild pitting edema noted to right hip Neurological: He is alert and oriented to person, place, and time.  Skin: Skin is warm and dry. No rash noted. No erythema.  Psychiatric: He has a normal mood and affect. His behavior is normal.       Lab Results Recent Labs    01/27/18 0527  01/28/18 0521  WBC 9.9 13.1*  HGB 6.7* 8.5*  HCT 20.6* 24.2*  NA 139 139  K 4.3 4.5  CL 110 112*  CO2 23 23  BUN 22 24*  CREATININE 1.02 0.93     Microbiology: cx no growth Studies/Results: No results found.   Assessment/Plan: pji = will treat as culture negative. Will add ceftriaxone 2gm IV Daily in addition to vancomycin. Will place opat orders in for plan on 6 wk. Will see back in 4 wk. Spent 25 min discussing IV treatment course with patient and his wife, who will be administering his IV abtx.  Diagnosis: pji  Culture Result: no growth  No Known Allergies  OPAT Orders Discharge antibiotics: Per pharmacy protocol  -- vancomycin and ceftriaxone 2gm IV  Aim for Vancomycin trough 15-20 (unless otherwise indicated) Duration: 6 wks  End Date: Sep 24  [place] Per Protocol:  Labs weekly while on IV antibiotics: _x_ CBC with differential _x_ BMP twice per week __ CMP _x_ CRP _x_ ESR _x_ Vancomycin trough  _x_ Please pull PIC at completion of IV antibiotics   Fax weekly labs to [phone]  Clinic Follow Up Appt: 4-6 wk with Abdirizak Richison  @ [facility] for Infectious Diseases Cell: [phone] Pager: [phone]  01/29/2018, 10:39 AM

## 2018-01-29 NOTE — Progress Notes (Signed)
Vitals stable overnight. Good PO intake. No complaint of pain.

## 2018-01-29 NOTE — Progress Notes (Signed)
PHARMACY CONSULT NOTE FOR:  OUTPATIENT  PARENTERAL ANTIBIOTIC THERAPY (OPAT)  Indication: Prosthetic Joint Infection Regimen: vancomycin 1 g IV q24h Ceftriaxone 2 g IV q24h End date: 03/10/18  IV antibiotic discharge orders are pended. To discharging provider:  please sign these orders via discharge navigator,  Select New Orders & click on the button choice - Manage This Unsigned Work.     Thank you for allowing pharmacy to be a part of this patient's care.  Ivery Qualeaylor  A Zaia Carre 01/29/2018, 11:04 AM

## 2018-01-29 NOTE — Progress Notes (Addendum)
Patient ID: Travis HeritageRobert L Salay, male   DOB: 18-Jun-1934, 83 y.o.   MRN: [id number] Subjective: 3 Days Post-Op Procedure(s) (LRB): Right hip irrigation and debridement, excisional and non excisional debridement, head liner exchange, posterior approach (Right)    Patient reports pain as mild.  No events.  Up with therapy  Objective:   VITALS:   Vitals:   01/28/18 2053 01/29/18 0450  BP: 130/65 130/62  Pulse: 69 73  Resp: 15 16  Temp: 98.4 F (36.9 C) 98.2 F (36.8 C)  SpO2: 100% 100%    Neurovascular intact Incision: dressing C/D/I  I removed his 2 drains today without significant drainage  LABS Recent Labs    01/26/18 1904 01/27/18 0527 01/28/18 0521  HGB 7.8* 6.7* 8.5*  HCT 23.8* 20.6* 24.2*  WBC 12.7* 9.9 13.1*  PLT 273 256 262    Recent Labs    01/26/18 1710 01/27/18 0527 01/28/18 0521  NA 139 139 139  K 3.6 4.3 4.5  BUN  --  22 24*  CREATININE  --  1.02 0.93  GLUCOSE 103* 193* 150*    No results for input(s): LABPT, INR in the last 72 hours.   Assessment/Plan: 3 Days Post-Op Procedure(s) (LRB): Right hip irrigation and debridement, excisional and non excisional debridement, head liner exchange, posterior approach (Right)   Up with therapy  Home today PIC line in place for IV antibiotics for 6 weeks per ID RTC in 2 weeks WBAT  Posterior hip precautions  ABLA - Hgb stable after 2 units PRBCs VSS Iron on discharge for 2-3 weeks as tolerable

## 2018-01-29 NOTE — Plan of Care (Signed)
Pt doing well this am.  Pain well controlled. Received PICC yesterday, working with PT RN will monitor.

## 2018-01-29 NOTE — Progress Notes (Signed)
Discharge paperwork discussed with pt and wife at the bedside.  They demonstrated understanding, and pt was escorted to main lobby by wheelchair in stable condition.

## 2018-01-29 NOTE — Progress Notes (Signed)
Physical Therapy Treatment Patient Details Name: Travis Wolfe MRN: [id number] DOB: [birth year] Today's Date: 01/29/2018    History of Present Illness Pt is an 83 year old male s/p Right hip irrigation and debridement, excisional and non excisional debridement, head liner revision with posterior approach on 01/26/18 and hx of multiple hip and back surgeries.    PT Comments    Pt ambulated in hallway and performed steps without difficulty.  Pt able to recall all hip precautions.  Pt performed LE exercises and reviewed HEP handout with pt.  Pt had no further questions and feels ready for d/c home today.   Follow Up Recommendations  Follow surgeon's recommendation for DC plan and follow-up therapies(plan for no f/u)     Equipment Recommendations  None recommended by PT    Recommendations for Other Services       Precautions / Restrictions Precautions Precautions: Fall;Posterior Hip Restrictions Weight Bearing Restrictions: No Other Position/Activity Restrictions: WBAT    Mobility  Bed Mobility               General bed mobility comments: standing with RN at bedside  Transfers Overall transfer level: Needs assistance Equipment used: Rolling walker (2 wheeled) Transfers: Sit to/from Stand Sit to Stand: Supervision         General transfer comment: able to recall safe technique  Ambulation/Gait Ambulation/Gait assistance: Supervision Gait Distance (Feet): 350 Feet Assistive device: Rolling walker (2 wheeled) Gait Pattern/deviations: Decreased stance time - right;Antalgic;Step-through pattern     General Gait Details: verbal cues for step length, good use of RW   Stairs Stairs: Yes Stairs assistance: Min guard Stair Management: Step to pattern;Forwards;One rail Right Number of Stairs: 2 General stair comments: verbal cues for sequence, pt used one rail and HHA (plans to have spouse assist)  pt familiar with this technique from previous surgeries, no  difficulty performing   Wheelchair Mobility    Modified Rankin (Stroke Patients Only)       Balance                                            Cognition Arousal/Alertness: Awake/alert Behavior During Therapy: WFL for tasks assessed/performed Overall Cognitive Status: Within Functional Limits for tasks assessed                                        Exercises Total Joint Exercises Ankle Circles/Pumps: AROM;Both;10 reps Quad Sets: AROM;10 reps;Both Heel Slides: AAROM;10 reps;Right(within precautions) Hip ABduction/ADduction: AAROM;10 reps;Right Long Arc Quad: AROM;10 reps;Right;Seated    General Comments        Pertinent Vitals/Pain Pain Assessment: 0-10 Pain Score: 4  Pain Location: R hip Pain Descriptors / Indicators: Sore Pain Intervention(s): Repositioned;Limited activity within patient's tolerance;Monitored during session;RN gave pain meds during session    Home Living                      Prior Function            PT Goals (current goals can now be found in the care plan section) Progress towards PT goals: Progressing toward goals    Frequency    7X/week      PT Plan Current plan remains appropriate    Co-evaluation  AM-PAC PT "6 Clicks" Daily Activity  Outcome Measure  Difficulty turning over in bed (including adjusting bedclothes, sheets and blankets)?: None Difficulty moving from lying on back to sitting on the side of the bed? : A Little Difficulty sitting down on and standing up from a chair with arms (e.g., wheelchair, bedside commode, etc,.)?: A Little Help needed moving to and from a bed to chair (including a wheelchair)?: A Little Help needed walking in hospital room?: A Little Help needed climbing 3-5 steps with a railing? : A Little 6 Click Score: 19    End of Session   Activity Tolerance: Patient tolerated treatment well Patient left: with family/visitor present;with  call bell/phone within reach;in chair;with nursing/sitter in room   PT Visit Diagnosis: Other abnormalities of gait and mobility (R26.89)     Time: 7829-56210924-0950 PT Time Calculation (min) (ACUTE ONLY): 26 min  Charges:  $Gait Training: 8-22 mins $Therapeutic Exercise: 8-22 mins                     Zenovia JarredKati Petula Rotolo, PT, DPT 01/29/2018 Pager: [phone]  Maida SaleLEMYRE,[name] 01/29/2018, 1:02 PM

## 2018-01-29 NOTE — Progress Notes (Signed)
During administration of IV vancomycin, pt complained of soreness and IV site became tender and pink.  Abx stopped and line flushed, [facility] so [facility] dose before dc.  IV team consulted to get line ready for d/c.

## 2018-01-31 LAB — AEROBIC/ANAEROBIC CULTURE (SURGICAL/DEEP WOUND)
CULTURE: NO GROWTH
CULTURE: NO GROWTH

## 2018-01-31 LAB — AEROBIC/ANAEROBIC CULTURE W GRAM STAIN (SURGICAL/DEEP WOUND): Culture: NO GROWTH

## 2018-02-03 DIAGNOSIS — T5691XA Toxic effect of unspecified metal, accidental (unintentional), initial encounter: Secondary | ICD-10-CM

## 2018-02-03 DIAGNOSIS — B958 Unspecified staphylococcus as the cause of diseases classified elsewhere: Secondary | ICD-10-CM

## 2018-02-05 NOTE — Discharge Summary (Signed)
Physician Discharge Summary  Patient ID: Travis Wolfe MRN: [id number] DOB/AGE: December 16, 1934 83 y.o.  Admit date: 01/26/2018 Discharge date: 01/29/2018   Procedures:  Procedure(s) (LRB): Right hip irrigation and debridement, excisional and non excisional debridement, head liner exchange, posterior approach (Right)  Attending Physician:  Dr. Paralee Cancel   Admission Diagnoses:    Infected right THA  Discharge Diagnoses:  Principal Problem:   Infection of right prosthetic hip joint ([place]) Active Problems:   S/P hip replacement   Metallosis   Staphylococcus infection  Past Medical History:  Diagnosis Date  . A-fib ([place])    REPORTS WAS DUE TO DEHYDRATION 3 YEARS AGO BUT WAS GIVEN FLUIDS IN ED AND MEDS TO SLOW HR AND DROVE HIMSELF HOME  ; HAD ABLATION X2 WITH CARDIO DR. AKBARY AS PRECAUTION  ; has been on eliquis since but reports he has not taken it for 5 weeks b/c he was told to hold it for the multiple irrigations and debridements for the hip infection. reports his cardiologist is aware he has been holding it;   . Arthritis   . Atherosclerosis of both carotid arteries   . BPH (benign prostatic hyperplasia)   . Fall    fell on friday 01-16-18;   lost balAnce whIile tryng to get his sock on ;sustained skin tear to left wrist ; no drainage  , scabbed over , and has been covering with bandage   . GERD (gastroesophageal reflux disease)   . Heart murmur    SINCE HIS 20s  . HLD (hyperlipidemia)   . Hypertension   . Non-rheumatic mitral regurgitation   . PVC (premature ventricular contraction)     HPI:    Pt is a 83 y.o. male complaining of right hip pain for ~2 years. Pain had continually increased since the beginning. Patient had the index THA and then 2 subsequent revision/I&D's per the patient.  2X-rays in the clinic show previous right THA. Pt has tried various conservative treatments which have failed to alleviate their symptoms, including NSAIDs / analgesic medications,  activity modifications and use of assistance devices.  He does rate his pain as a 8 out of 10 on a 10 point scale.  States that he is not really effected at night, but pain increase with WB and activity. Various options are discussed with the patient. Risks, benefits and expectations were discussed with the patient. Patient understand the risks, benefits and expectations and wishes to proceed with surgery.   PCP: Derrill Center., MD   Discharged Condition: good  Hospital Course:  Patient underwent the above stated procedure on 01/26/2018. Patient tolerated the procedure well and brought to the recovery room in good condition and subsequently to the floor.  POD #1 BP: 107/57 ; Pulse: 71 ; Temp: 98 F (36.7 C) ; Resp: 12 Patient reports pain as mild to moderate but didn't sleep at all last night, typically uses lunesta as sleep aid. Neurovascular intact and incision: dressing C/D/I. Drains in place and functioning.  LABS  Basename    HGB     6.7  HCT     20.6   POD #2  BP: 104/54 ; Pulse: 63 ; Temp: 98.1 F (36.7 C) ; Resp: 16 Patient reports pain as mild, better than is was prior to surgery.  No reported events throughout the night.  Patient just getting PICC line placed today.  Waiting on cultures and ID for final recommendation for antibiotic treatment. Received blood. Dorsiflexion/plantar flexion intact, incision: dressing C/D/I,  no cellulitis present and compartment soft.   LABS  Basename    HGB     8.5  HCT     24.2   POD #3  BP: 130/62 ; Pulse: 73 ; Temp: 98.2 F (36.8 C) ; Resp: 16 Patient reports pain as mild.  No events.  Up with therapy.  Ready to be discharged home.  Neurovascular intact and incision: dressing C/D/I. Removed his 2 drains today without significant drainage  LABS   No new labs   Discharge Exam: General appearance: alert, cooperative and no distress Extremities: Homans sign is negative, no sign of DVT, no edema, redness or tenderness in the calves or  thighs and no ulcers, gangrene or trophic changes  Disposition:  Home with follow up in 2 weeks   Follow-up Information    Paralee Cancel, MD. Schedule an appointment as soon as possible for a visit in 2 weeks.   Specialty:  Orthopedic Surgery Contact information: [address] [place] [address] [phone]           Discharge Instructions    Call MD / Call 911   Complete by:  As directed    If you experience chest pain or shortness of breath, CALL 911 and be transported to the hospital emergency room.  If you develope a fever above 101 F, pus (white drainage) or increased drainage or redness at the wound, or calf pain, call your surgeon's office.   Change dressing   Complete by:  As directed    Maintain surgical dressing until follow up in the clinic. If the edges start to pull up, may reinforce with tape. If the dressing is no longer working, may remove and cover with gauze and tape, but must keep the area dry and clean.  Call with any questions or concerns.   Constipation Prevention   Complete by:  As directed    Drink plenty of fluids.  Prune juice may be helpful.  You may use a stool softener, such as Colace (over the counter) 100 mg twice a day.  Use MiraLax (over the counter) for constipation as needed.   Diet - low sodium heart healthy   Complete by:  As directed    Discharge instructions   Complete by:  As directed    Maintain surgical dressing until follow up in the clinic. If the edges start to pull up, may reinforce with tape. If the dressing is no longer working, may remove and cover with gauze and tape, but must keep the area dry and clean.  Follow up in 2 weeks at [facility]. Call with any questions or concerns.   Follow the hip precautions as taught in Physical Therapy   Complete by:  As directed    Home infusion instructions [facility] May follow [place] Dosing Protocol; May administer Cathflo as needed to maintain  patency of vascular access device.; Flushing of vascular access device: per [facility] Protocol: 0.9% NaCl pre/post medica...   Complete by:  As directed    Instructions:  May follow [place] Dosing Protocol   Instructions:  May administer Cathflo as needed to maintain patency of vascular access device.   Instructions:  Flushing of vascular access device: per [facility] Protocol: 0.9% NaCl pre/post medication administration and prn patency; Heparin 100 u/ml, 12m for implanted ports and Heparin 10u/ml, 540mfor all other central venous catheters.   Instructions:  May follow [facility] Anaphylaxis Protocol for First Dose Administration in the home: 0.9%  NaCl at 25-50 ml/hr to maintain IV access for protocol meds. Epinephrine 0.3 ml IV/IM PRN and Benadryl 25-50 IV/IM PRN s/s of anaphylaxis.   Instructions:  [place] Infusion Coordinator (RN) to assist per patient IV care needs in the home PRN.   Increase activity slowly as tolerated   Complete by:  As directed    Weight bearing as tolerated with assist device (walker, cane, etc) as directed, use it as long as suggested by your surgeon or therapist, typically at least 4-6 weeks.   TED hose   Complete by:  As directed    Use stockings (TED hose) for 2 weeks on both leg(s).  You may remove them at night for sleeping.      Allergies as of 01/29/2018   No Known Allergies     Medication List    TAKE these medications   aspirin 81 MG chewable tablet Chew 1 tablet (81 mg total) by mouth 2 (two) times daily. Take for 4 weeks, then resume regular dose.   atorvastatin 40 MG tablet Commonly known as:  LIPITOR Take 40 mg by mouth daily.   cefTRIAXone  IVPB Commonly known as:  ROCEPHIN Inject 2 g into the vein daily. Indication:  Prosthetic joint infection Last Day of Therapy:  03/10/2018 Labs - Once weekly:  CBC/D and BMP, Labs - Every other week:  ESR and CRP Notes to patient:  Take as prescribed   COSOPT PF 22.3-6.8 MG/ML Soln ophthalmic  solution Generic drug:  dorzolamidel-timolol Place 1 drop into both eyes every morning.   docusate sodium 100 MG capsule Commonly known as:  COLACE Take 1 capsule (100 mg total) by mouth 2 (two) times daily.   finasteride 5 MG tablet Commonly known as:  PROSCAR Take 5 mg by mouth daily.   flecainide 50 MG tablet Commonly known as:  TAMBOCOR Take 50 mg by mouth 2 (two) times daily.   HYDROcodone-acetaminophen 7.5-325 MG tablet Commonly known as:  NORCO Take 1-2 tablets by mouth every 4 (four) hours as needed for moderate pain.   iron polysaccharides 150 MG capsule Commonly known as:  NIFEREX Take 150 mg by mouth daily.   LUMIGAN 0.01 % Soln Generic drug:  bimatoprost Notes to patient:  Take as prescribed   LYCOPENE PO Take 1 tablet by mouth daily.   MAGNESIUM CARBONATE PO Take 1 tablet by mouth daily.   methocarbamol 500 MG tablet Commonly known as:  ROBAXIN Take 1 tablet (500 mg total) by mouth every 6 (six) hours as needed for muscle spasms.   multivitamin-lutein Caps capsule Take 1 capsule by mouth daily.   NEXIUM 40 MG capsule Generic drug:  esomeprazole Take 40 mg by mouth daily.   polyethylene glycol packet Commonly known as:  MIRALAX / GLYCOLAX Take 17 g by mouth 2 (two) times daily.   vancomycin  IVPB Inject 1,000 mg into the vein daily. Indication:  Prosthetic joint infection Last Day of Therapy:  03/10/2018 Labs - Sunday/Monday:  CBC/D, BMP, and vancomycin trough. Labs - Thursday:  BMP and vancomycin trough Labs - Every other week:  ESR and CRP Notes to patient:  Take as prescribed   ZINC ACETATE PO Take 1 tablet by mouth daily.            Home Infusion Instuctions  (From admission, onward)         Start     Ordered   01/29/18 0000  Home infusion instructions [place] May follow [place]  Protocol; May administer Cathflo as needed to maintain patency of vascular access device.; Flushing of vascular access device: per  [facility] Protocol: 0.9% NaCl pre/post medica...    Question Answer Comment  Instructions May follow [place] Dosing Protocol   Instructions May administer Cathflo as needed to maintain patency of vascular access device.   Instructions Flushing of vascular access device: per [facility] Protocol: 0.9% NaCl pre/post medication administration and prn patency; Heparin 100 u/ml, 22m for implanted ports and Heparin 10u/ml, 579mfor all other central venous catheters.   Instructions May follow [facility] Anaphylaxis Protocol for First Dose Administration in the home: 0.9% NaCl at 25-50 ml/hr to maintain IV access for protocol meds. Epinephrine 0.3 ml IV/IM PRN and Benadryl 25-50 IV/IM PRN s/s of anaphylaxis.   Instructions [facility] Infusion Coordinator (RN) to assist per patient IV care needs in the home PRN.      01/29/18 1232           Discharge Care Instructions  (From admission, onward)         Start     Ordered   01/29/18 0000  Change dressing    Comments:  Maintain surgical dressing until follow up in the clinic. If the edges start to pull up, may reinforce with tape. If the dressing is no longer working, may remove and cover with gauze and tape, but must keep the area dry and clean.  Call with any questions or concerns.   01/29/18 1234           Signed: MaWest PughBabish   PA-C  02/05/2018, 5:18 PM

## 2018-02-17 ENCOUNTER — Telehealth: Payer: Self-pay | Admitting: *Deleted

## 2018-02-17 NOTE — Telephone Encounter (Signed)
Received call from Orthopedic office. Patient has been experiencing fever, bad headache since his vancomycin was increased (now 1500 mg per dose).  He reports fevers of 102.2, 102.8. Per orthopedic office, no redness or warmth, but swelling and fluid are present at the incision. The orthopedic surgeon will order Sprial CT and doppler to screen for blood clot. Per Dr Drue Second, ok to see 9/4 at 3:30; hold antibiotics until seen. RN called [facility] pharmacy, spoke with Corretta to alert her of the change and give Dr Feliz Beam verbal order to hold antibiotics. Andree Coss, RN

## 2018-02-18 ENCOUNTER — Encounter: Payer: Self-pay | Admitting: Internal Medicine

## 2018-02-18 ENCOUNTER — Ambulatory Visit (INDEPENDENT_AMBULATORY_CARE_PROVIDER_SITE_OTHER): Payer: Medicare Other | Admitting: Internal Medicine

## 2018-02-18 ENCOUNTER — Ambulatory Visit (HOSPITAL_BASED_OUTPATIENT_CLINIC_OR_DEPARTMENT_OTHER)
Admission: RE | Admit: 2018-02-18 | Discharge: 2018-02-18 | Disposition: A | Discharge status: Home / Self Care | Payer: Medicare Other | Source: Ambulatory Visit | Attending: Orthopedic Surgery | Admitting: Orthopedic Surgery

## 2018-02-18 ENCOUNTER — Other Ambulatory Visit: Payer: Self-pay | Admitting: Orthopedic Surgery

## 2018-02-18 ENCOUNTER — Other Ambulatory Visit (HOSPITAL_BASED_OUTPATIENT_CLINIC_OR_DEPARTMENT_OTHER): Payer: Self-pay | Admitting: Orthopedic Surgery

## 2018-02-18 VITALS — Ht 68.0 in | Wt 134.0 lb

## 2018-02-18 DIAGNOSIS — T887XXA Unspecified adverse effect of drug or medicament, initial encounter: Secondary | ICD-10-CM

## 2018-02-18 DIAGNOSIS — M7989 Other specified soft tissue disorders: Secondary | ICD-10-CM

## 2018-02-18 DIAGNOSIS — R936 Abnormal findings on diagnostic imaging of limbs: Secondary | ICD-10-CM | POA: Insufficient documentation

## 2018-02-18 DIAGNOSIS — T8451XS Infection and inflammatory reaction due to internal right hip prosthesis, sequela: Secondary | ICD-10-CM | POA: Diagnosis not present

## 2018-02-18 DIAGNOSIS — M79661 Pain in right lower leg: Secondary | ICD-10-CM | POA: Insufficient documentation

## 2018-02-18 DIAGNOSIS — R509 Fever, unspecified: Secondary | ICD-10-CM

## 2018-02-18 DIAGNOSIS — Z9889 Other specified postprocedural states: Secondary | ICD-10-CM

## 2018-02-18 DIAGNOSIS — T8451XA Infection and inflammatory reaction due to internal right hip prosthesis, initial encounter: Secondary | ICD-10-CM | POA: Diagnosis not present

## 2018-02-18 LAB — CBC WITH DIFFERENTIAL/PLATELET
BASOS ABS: 27 {cells}/uL (ref 0–200)
BASOS PCT: 0.4 %
Eosinophils Absolute: 536 cells/uL — ABNORMAL HIGH (ref 15–500)
Eosinophils Relative: 8 %
HEMATOCRIT: 30.5 % — AB (ref 38.5–50.0)
HEMOGLOBIN: 10.1 g/dL — AB (ref 13.2–17.1)
Lymphs Abs: 657 cells/uL — ABNORMAL LOW (ref 850–3900)
MCH: 29.7 pg (ref 27.0–33.0)
MCHC: 33.1 g/dL (ref 32.0–36.0)
MCV: 89.7 fL (ref 80.0–100.0)
MPV: 10.3 fL (ref 7.5–12.5)
Monocytes Relative: 12.2 %
Neutro Abs: 4663 cells/uL (ref 1500–7800)
Neutrophils Relative %: 69.6 %
Platelets: 291 10*3/uL (ref 140–400)
RBC: 3.4 10*6/uL — ABNORMAL LOW (ref 4.20–5.80)
RDW: 13.6 % (ref 11.0–15.0)
Total Lymphocyte: 9.8 %
WBC mixed population: 817 cells/uL (ref 200–950)
WBC: 6.7 10*3/uL (ref 3.8–10.8)

## 2018-02-18 NOTE — Progress Notes (Signed)
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   MRN: [id number]  HPI 83yo M with right hip pji s/p washout on ceftriaxone plus vancomycin til sep 24. He has started to develop ha in the last 7-10d and in the last 3-4 days having evening chills/rigors roughly 2-5 hrs after his vancomycin infusion.  He states after being discharged home he still has some drainage from incision- predominantly serous. He has been following up routinely with dr Alvan Dame.  He has reaccumulation of fluid directly beneath his right hip incision but also some accumulation in distal posterior quad. No warmth or erythema.  picc line is c/d/i. His wife (who is an Therapist, sports) denies any fevers or chills while infusion of vancomycin . He does ceftriaxone infusion in the morning and tolerates without difficulty  Has seen dr Alvan Dame yesterday - getting chest ct to rule out pe  Called [facility] to get labs  Outpatient Encounter Medications as of 02/18/2018  Medication Sig  . apixaban (ELIQUIS) 5 MG TABS tablet   . aspirin (ASPIRIN CHILDRENS) 81 MG chewable tablet Chew 1 tablet (81 mg total) by mouth 2 (two) times daily. Take for 4 weeks, then resume regular dose.  Marland Kitchen atorvastatin (LIPITOR) 40 MG tablet Take 40 mg by mouth daily.  . COSOPT PF 22.3-6.8 MG/ML SOLN ophthalmic solution Place 1 drop into both eyes every morning.  . docusate sodium (COLACE) 100 MG capsule Take 1 capsule (100 mg total) by mouth 2 (two) times daily.  . flecainide (TAMBOCOR) 50 MG tablet Take 50 mg by mouth 2 (two) times daily.  . iron polysaccharides (NIFEREX) 150 MG capsule Take 150 mg by mouth daily.  Marland Kitchen LUMIGAN 0.01 % SOLN   . LYCOPENE PO Take 1 tablet by mouth daily.  Marland Kitchen MAGNESIUM CARBONATE PO Take 1 tablet by mouth daily.  Marland Kitchen NEXIUM 40 MG capsule Take 40 mg by mouth daily.   . tadalafil (CIALIS) 5 MG tablet TAKE ONE TABLET BY MOUTH ONCE DAILY  . Zinc Acetate, Oral, (ZINC ACETATE PO) Take 1 tablet by  mouth daily.  . cefTRIAXone (ROCEPHIN) IVPB Inject 2 g into the vein daily. Indication:  Prosthetic joint infection Last Day of Therapy:  03/10/2018 Labs - Once weekly:  CBC/D and BMP, Labs - Every other week:  ESR and CRP (Patient not taking: Reported on 02/18/2018)  . Eszopiclone 3 MG TABS Take 3 mg by mouth at bedtime.  . finasteride (PROSCAR) 5 MG tablet Take 5 mg by mouth daily.  . furosemide (LASIX) 20 MG tablet Take 20 mg by mouth daily.  Marland Kitchen HYDROcodone-acetaminophen (NORCO) 7.5-325 MG tablet Take 1-2 tablets by mouth every 4 (four) hours as needed for moderate pain. (Patient not taking: Reported on 02/18/2018)  . methocarbamol (ROBAXIN) 500 MG tablet Take 1 tablet (500 mg total) by mouth every 6 (six) hours as needed for muscle spasms. (Patient not taking: Reported on 02/18/2018)  . multivitamin-lutein (OCUVITE-LUTEIN) CAPS capsule Take 1 capsule by mouth daily.  Marland Kitchen NARCAN 4 MG/0.1ML LIQD nasal spray kit CALL 911. ADMINISTER A SINGLE SPRAY OF NARCAN IN ONE NOSTRIL. REPEAT EVERY 3 MINUTES AS NEEDED IF NO OR MINIMAL RESPONSE.  . polyethylene glycol (MIRALAX / GLYCOLAX) packet Take 17 g by mouth 2 (two) times daily. (Patient not taking: Reported on 02/18/2018)  . vancomycin IVPB Inject 1,000 mg into the vein daily. Indication:  Prosthetic joint infection Last Day of Therapy:  03/10/2018 Labs - Sunday/Monday:  CBC/D, BMP, and vancomycin trough. Labs - Thursday:  BMP and vancomycin trough Labs - Every other week:  ESR and CRP (Patient not taking: Reported on 02/18/2018)   No facility-administered encounter medications on file as of 02/18/2018.      Patient Active Problem List   Diagnosis Date Noted  . Metallosis   . Staphylococcus infection   . 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 S/P hip replacement 01/26/2018  . Cellulitis of right leg 12/15/2017  . PAF (paroxysmal atrial fibrillation) ([place]) 12/15/2017  . Abdominal wall pain in right lower quadrant 12/02/2012     Health  Maintenance Due  Topic Date Due  . TETANUS/TDAP  05/28/1956  . PNA vac Low Risk Adult (1 of 2 - PCV13) 05/28/2002  . INFLUENZA VACCINE  01/15/2018     Review of Systems  Physical Exam   Ht _0  (1.727 m)   Wt 134 lb (60.8 kg)   BMI 20.37 kg/m    No results found for: CD4TCELL No results found for: CD4TABS No results found for: HIV1RNAQUANT No results found for: HEPBSAB No results found for: RPR, LABRPR  CBC Lab Results  Component Value Date   WBC 13.1 (H) 01/28/2018   RBC 2.70 (L) 01/28/2018   HGB 8.5 (L) 01/28/2018   HCT 24.2 (L) 01/28/2018   PLT 262 01/28/2018   MCV 89.6 01/28/2018   MCH 31.5 01/28/2018   MCHC 35.1 01/28/2018   RDW 15.3 01/28/2018   LYMPHSABS 0.3 (L) 12/15/2017   MONOABS 1.2 (H) 12/15/2017   EOSABS 0.0 12/15/2017    BMET Lab Results  Component Value Date   NA 139 01/28/2018   K 4.5 01/28/2018   CL 112 (H) 01/28/2018   CO2 23 01/28/2018   GLUCOSE 150 (H) 01/28/2018   BUN 24 (H) 01/28/2018   CREATININE 0.93 01/28/2018   CALCIUM 8.1 (L) 01/28/2018   GFRNONAA >60 01/28/2018   GFRAA >60 01/28/2018      Assessment and Plan  Fevers -suspect that its vancomycin related. Will hold and watch  Will get blood cx x 2  Will call to se how he is doing in 2-3 days  Prosthetic joint infection vs. metallosis = continue on ceftriaxone alone  Addendum - cx are negative nad fevers dissipated. Symptoms thought to be due to vancomycin

## 2018-02-20 ENCOUNTER — Other Ambulatory Visit: Payer: Self-pay

## 2018-02-20 ENCOUNTER — Encounter (HOSPITAL_COMMUNITY): Payer: Self-pay

## 2018-02-20 ENCOUNTER — Emergency Department (HOSPITAL_COMMUNITY): Payer: Medicare Other

## 2018-02-20 ENCOUNTER — Inpatient Hospital Stay (HOSPITAL_COMMUNITY)
Admission: EM | Admit: 2018-02-20 | Discharge: 2018-02-22 | DRG: 559 | Disposition: A | Discharge status: Home Health | Payer: Medicare Other | Attending: Internal Medicine | Admitting: Internal Medicine

## 2018-02-20 DIAGNOSIS — R52 Pain, unspecified: Secondary | ICD-10-CM

## 2018-02-20 DIAGNOSIS — Y831 Surgical operation with implant of artificial internal device as the cause of abnormal reaction of the patient, or of later complication, without mention of misadventure at the time of the procedure: Secondary | ICD-10-CM | POA: Diagnosis present

## 2018-02-20 DIAGNOSIS — E785 Hyperlipidemia, unspecified: Secondary | ICD-10-CM | POA: Diagnosis present

## 2018-02-20 DIAGNOSIS — T8451XA Infection and inflammatory reaction due to internal right hip prosthesis, initial encounter: Secondary | ICD-10-CM

## 2018-02-20 DIAGNOSIS — Z7901 Long term (current) use of anticoagulants: Secondary | ICD-10-CM | POA: Diagnosis not present

## 2018-02-20 DIAGNOSIS — Z7982 Long term (current) use of aspirin: Secondary | ICD-10-CM

## 2018-02-20 DIAGNOSIS — D649 Anemia, unspecified: Secondary | ICD-10-CM | POA: Diagnosis present

## 2018-02-20 DIAGNOSIS — T5691XA Toxic effect of unspecified metal, accidental (unintentional), initial encounter: Secondary | ICD-10-CM | POA: Diagnosis present

## 2018-02-20 DIAGNOSIS — A419 Sepsis, unspecified organism: Secondary | ICD-10-CM | POA: Diagnosis present

## 2018-02-20 DIAGNOSIS — Z79890 Hormone replacement therapy (postmenopausal): Secondary | ICD-10-CM | POA: Diagnosis not present

## 2018-02-20 DIAGNOSIS — N400 Enlarged prostate without lower urinary tract symptoms: Secondary | ICD-10-CM | POA: Diagnosis present

## 2018-02-20 DIAGNOSIS — Z96641 Presence of right artificial hip joint: Secondary | ICD-10-CM | POA: Diagnosis present

## 2018-02-20 DIAGNOSIS — I10 Essential (primary) hypertension: Secondary | ICD-10-CM | POA: Diagnosis present

## 2018-02-20 DIAGNOSIS — T8451XD Infection and inflammatory reaction due to internal right hip prosthesis, subsequent encounter: Secondary | ICD-10-CM | POA: Diagnosis not present

## 2018-02-20 DIAGNOSIS — I480 Paroxysmal atrial fibrillation: Secondary | ICD-10-CM | POA: Diagnosis present

## 2018-02-20 DIAGNOSIS — R509 Fever, unspecified: Secondary | ICD-10-CM | POA: Diagnosis present

## 2018-02-20 DIAGNOSIS — K219 Gastro-esophageal reflux disease without esophagitis: Secondary | ICD-10-CM | POA: Diagnosis present

## 2018-02-20 DIAGNOSIS — R5081 Fever presenting with conditions classified elsewhere: Secondary | ICD-10-CM | POA: Diagnosis not present

## 2018-02-20 LAB — CBC WITH DIFFERENTIAL/PLATELET
Basophils Absolute: 0 10*3/uL (ref 0.0–0.1)
Basophils Relative: 0 %
EOS ABS: 0.4 10*3/uL (ref 0.0–0.7)
Eosinophils Relative: 4 %
HCT: 28 % — ABNORMAL LOW (ref 39.0–52.0)
HEMOGLOBIN: 8.9 g/dL — AB (ref 13.0–17.0)
Lymphocytes Relative: 5 %
Lymphs Abs: 0.4 10*3/uL — ABNORMAL LOW (ref 0.7–4.0)
MCH: 29.4 pg (ref 26.0–34.0)
MCHC: 31.8 g/dL (ref 30.0–36.0)
MCV: 92.4 fL (ref 78.0–100.0)
Monocytes Absolute: 0.6 10*3/uL (ref 0.1–1.0)
Monocytes Relative: 8 %
NEUTROS ABS: 6.9 10*3/uL (ref 1.7–7.7)
NEUTROS PCT: 83 %
PLATELETS: 291 10*3/uL (ref 150–400)
RBC: 3.03 MIL/uL — AB (ref 4.22–5.81)
RDW: 14.8 % (ref 11.5–15.5)
WBC: 8.3 10*3/uL (ref 4.0–10.5)

## 2018-02-20 LAB — COMPREHENSIVE METABOLIC PANEL
ALT: 24 U/L (ref 0–44)
ANION GAP: 11 (ref 5–15)
AST: 28 U/L (ref 15–41)
Albumin: 2.7 g/dL — ABNORMAL LOW (ref 3.5–5.0)
Alkaline Phosphatase: 74 U/L (ref 38–126)
BUN: 13 mg/dL (ref 8–23)
CO2: 25 mmol/L (ref 22–32)
Calcium: 8.5 mg/dL — ABNORMAL LOW (ref 8.9–10.3)
Chloride: 105 mmol/L (ref 98–111)
Creatinine, Ser: 0.69 mg/dL (ref 0.61–1.24)
GFR calc Af Amer: >60 mL/min (ref >60)
Glucose, Bld: 139 mg/dL — ABNORMAL HIGH (ref 70–99)
POTASSIUM: 3.4 mmol/L — AB (ref 3.5–5.1)
Sodium: 141 mmol/L (ref 135–145)
Total Bilirubin: 0.5 mg/dL (ref 0.3–1.2)
Total Protein: 6 g/dL — ABNORMAL LOW (ref 6.5–8.1)

## 2018-02-20 LAB — PROTIME-INR
INR: 1.03
PROTHROMBIN TIME: 13.4 s (ref 11.4–15.2)

## 2018-02-20 LAB — URINALYSIS, ROUTINE W REFLEX MICROSCOPIC
Bilirubin Urine: NEGATIVE
Glucose, UA: NEGATIVE mg/dL
Hgb urine dipstick: NEGATIVE
KETONES UR: NEGATIVE mg/dL
Leukocytes, UA: NEGATIVE
NITRITE: NEGATIVE
PH: 7 (ref 5.0–8.0)
Protein, ur: NEGATIVE mg/dL
SPECIFIC GRAVITY, URINE: 1.015 (ref 1.005–1.030)

## 2018-02-20 LAB — C-REACTIVE PROTEIN: CRP: 9.3 mg/dL — ABNORMAL HIGH (ref <1.0)

## 2018-02-20 LAB — I-STAT CG4 LACTIC ACID, ED: LACTIC ACID, VENOUS: 1.51 mmol/L (ref 0.5–1.9)

## 2018-02-20 MED ORDER — PROSIGHT PO TABS
1.0000 | ORAL_TABLET | Freq: Every day | ORAL | Status: DC
  Administered 2018-02-20 – 2018-02-21 (×2): 1 via ORAL
  Filled 2018-02-20 (×3): qty 1

## 2018-02-20 MED ORDER — SODIUM CHLORIDE 0.9 % IV SOLN
2.0000 g | Freq: Three times a day (TID) | INTRAVENOUS | Status: DC
  Administered 2018-02-20 – 2018-02-22 (×7): 2 g via INTRAVENOUS
  Filled 2018-02-20 (×8): qty 2

## 2018-02-20 MED ORDER — ZOLPIDEM TARTRATE 5 MG PO TABS
5.0000 mg | ORAL_TABLET | Freq: Every evening | ORAL | Status: DC | PRN
  Administered 2018-02-20 – 2018-02-21 (×2): 5 mg via ORAL
  Filled 2018-02-20 (×2): qty 1

## 2018-02-20 MED ORDER — VANCOMYCIN HCL 10 G IV SOLR
1500.0000 mg | INTRAVENOUS | Status: AC
  Administered 2018-02-20: 1500 mg via INTRAVENOUS
  Filled 2018-02-20: qty 1500

## 2018-02-20 MED ORDER — ACETAMINOPHEN 325 MG PO TABS
650.0000 mg | ORAL_TABLET | Freq: Once | ORAL | Status: AC | PRN
  Administered 2018-02-20: 650 mg via ORAL

## 2018-02-20 MED ORDER — VANCOMYCIN HCL IN DEXTROSE 1-5 GM/200ML-% IV SOLN
1000.0000 mg | INTRAVENOUS | Status: DC
  Administered 2018-02-21 – 2018-02-22 (×2): 1000 mg via INTRAVENOUS
  Filled 2018-02-20 (×2): qty 200

## 2018-02-20 MED ORDER — DORZOLAMIDE HCL-TIMOLOL MAL PF 22.3-6.8 MG/ML OP SOLN: 1.0000 [drp] | OPHTHALMIC | Status: DC

## 2018-02-20 MED ORDER — ONDANSETRON HCL 4 MG PO TABS
4.0000 mg | ORAL_TABLET | Freq: Four times a day (QID) | ORAL | Status: DC | PRN
  Administered 2018-02-20 – 2018-02-21 (×2): 4 mg via ORAL
  Filled 2018-02-20 (×2): qty 1

## 2018-02-20 MED ORDER — SODIUM CHLORIDE 0.9 % IV SOLN
INTRAVENOUS | Status: DC
  Administered 2018-02-20 – 2018-02-21 (×3): via INTRAVENOUS

## 2018-02-20 MED ORDER — POLYSACCHARIDE IRON COMPLEX 150 MG PO CAPS
150.0000 mg | ORAL_CAPSULE | Freq: Every day | ORAL | Status: DC
  Administered 2018-02-20 – 2018-02-22 (×3): 150 mg via ORAL
  Filled 2018-02-20 (×3): qty 1

## 2018-02-20 MED ORDER — ACETAMINOPHEN 650 MG RE SUPP: 650.0000 mg | Freq: Four times a day (QID) | RECTAL | Status: DC | PRN

## 2018-02-20 MED ORDER — TIMOLOL MALEATE 0.5 % OP SOLN
1.0000 [drp] | Freq: Every day | OPHTHALMIC | Status: DC
  Administered 2018-02-20 – 2018-02-22 (×3): 1 [drp] via OPHTHALMIC
  Filled 2018-02-20: qty 5

## 2018-02-20 MED ORDER — DORZOLAMIDE HCL 2 % OP SOLN
1.0000 [drp] | Freq: Every day | OPHTHALMIC | Status: DC
  Filled 2018-02-20: qty 10

## 2018-02-20 MED ORDER — POLYETHYLENE GLYCOL 3350 17 G PO PACK: 17.0000 g | PACK | Freq: Every day | ORAL | Status: DC | PRN

## 2018-02-20 MED ORDER — HYDROCODONE-ACETAMINOPHEN 7.5-325 MG PO TABS: 1.0000 | ORAL_TABLET | ORAL | Status: DC | PRN

## 2018-02-20 MED ORDER — METRONIDAZOLE IN NACL 5-0.79 MG/ML-% IV SOLN
500.0000 mg | Freq: Three times a day (TID) | INTRAVENOUS | Status: DC
  Administered 2018-02-20 – 2018-02-22 (×8): 500 mg via INTRAVENOUS
  Filled 2018-02-20 (×8): qty 100

## 2018-02-20 MED ORDER — ASPIRIN 81 MG PO CHEW
81.0000 mg | CHEWABLE_TABLET | Freq: Every day | ORAL | Status: DC
  Administered 2018-02-20 – 2018-02-22 (×3): 81 mg via ORAL
  Filled 2018-02-20 (×5): qty 1

## 2018-02-20 MED ORDER — SODIUM CHLORIDE 0.9 % IV SOLN
2.0000 g | Freq: Once | INTRAVENOUS | Status: AC
  Administered 2018-02-20: 2 g via INTRAVENOUS
  Filled 2018-02-20: qty 2

## 2018-02-20 MED ORDER — ATORVASTATIN CALCIUM 40 MG PO TABS
40.0000 mg | ORAL_TABLET | Freq: Every day | ORAL | Status: DC
  Administered 2018-02-21: 40 mg via ORAL
  Filled 2018-02-20: qty 1

## 2018-02-20 MED ORDER — ONDANSETRON HCL 4 MG/2ML IJ SOLN: 4.0000 mg | Freq: Four times a day (QID) | INTRAMUSCULAR | Status: DC | PRN

## 2018-02-20 MED ORDER — PANTOPRAZOLE SODIUM 40 MG PO TBEC
80.0000 mg | DELAYED_RELEASE_TABLET | Freq: Every day | ORAL | Status: DC
  Administered 2018-02-20 – 2018-02-22 (×3): 80 mg via ORAL
  Filled 2018-02-20 (×3): qty 2

## 2018-02-20 MED ORDER — APIXABAN 5 MG PO TABS
5.0000 mg | ORAL_TABLET | Freq: Two times a day (BID) | ORAL | Status: DC
  Filled 2018-02-20 (×4): qty 1

## 2018-02-20 MED ORDER — LATANOPROST 0.005 % OP SOLN
1.0000 [drp] | Freq: Every day | OPHTHALMIC | Status: DC
  Administered 2018-02-20 – 2018-02-21 (×2): 1 [drp] via OPHTHALMIC
  Filled 2018-02-20: qty 2.5

## 2018-02-20 MED ORDER — ACETAMINOPHEN 325 MG PO TABS
ORAL_TABLET | ORAL | Status: AC
  Administered 2018-02-20: 650 mg via ORAL
  Filled 2018-02-20: qty 2

## 2018-02-20 MED ORDER — FLECAINIDE ACETATE 50 MG PO TABS
50.0000 mg | ORAL_TABLET | Freq: Two times a day (BID) | ORAL | Status: DC
  Administered 2018-02-20 – 2018-02-22 (×5): 50 mg via ORAL
  Filled 2018-02-20 (×5): qty 1

## 2018-02-20 MED ORDER — FINASTERIDE 5 MG PO TABS
5.0000 mg | ORAL_TABLET | Freq: Every day | ORAL | Status: DC
  Administered 2018-02-20 – 2018-02-21 (×2): 5 mg via ORAL
  Filled 2018-02-20 (×3): qty 1

## 2018-02-20 MED ORDER — DOCUSATE SODIUM 100 MG PO CAPS
100.0000 mg | ORAL_CAPSULE | Freq: Two times a day (BID) | ORAL | Status: DC
  Administered 2018-02-20 – 2018-02-22 (×4): 100 mg via ORAL
  Filled 2018-02-20 (×5): qty 1

## 2018-02-20 MED ORDER — ACETAMINOPHEN 325 MG PO TABS
650.0000 mg | ORAL_TABLET | Freq: Four times a day (QID) | ORAL | Status: DC | PRN
  Administered 2018-02-20 – 2018-02-22 (×5): 650 mg via ORAL
  Filled 2018-02-20 (×5): qty 2

## 2018-02-20 NOTE — Progress Notes (Addendum)
A consult was received from an ED physician for cefepime and vancomycin per pharmacy dosing.  The patient's profile has been reviewed for ht/wt/allergies/indication/available labs.   A one time order has been placed for Cefepme 2 Gm and Vancomycin 1500 mg.  Further antibiotics/pharmacy consults should be ordered by admitting physician if indicated.                       Thank you, [name] 02/20/2018  1:25 AM

## 2018-02-20 NOTE — ED Triage Notes (Signed)
Pt from home chief complaint of HA, fever, chills. Sx on R hip with worsening swelling. States problem has been happening ever since surgery. Also pain in head from middle to side. 102.8T 144/70 110 94%.

## 2018-02-20 NOTE — ED Notes (Signed)
Bed: WA21 Expected date:  Expected time:  Means of arrival:  Comments: EMS 40M fever

## 2018-02-20 NOTE — ED Notes (Signed)
EDP made aware of patient condition.

## 2018-02-20 NOTE — Progress Notes (Signed)
PROGRESS NOTE  SKIP LITKE WUJ:[id number] DOB: [birth year] DOA: 02/20/2018 PCP: Elijio Miles., MD  HPI/Recap of past 24 hours: Travis Wolfe is a 83 y.o. male with medical history significant of PAF on eliquis, HTN. Pt presents to the ED c/o fever and chills for the past day, increased hip pain and some difficulty with ambulation. Of note, patient has had numerous complications following a R THA some years ago, followed by multiple other surgeries, wash outs, revisions. Pt was most recently admitted with suspected PJI last month, underwent washout, placed on Vanc and ceftriaxone for 6 weeks with fevers still persisting. Pt saw Dr Drue Second from ID, was thought fevers were due to Vancomycin and discontinued it. Multiple cultures have been so far negative. In the ED, pt had a fever of 103. CT R hip showed suggestion of bone separation and soft tissue gas at the greater trochanter. This could represent postoperative change or periprosthetic infection. No abscess. Pt admitted for further management.   Today, pt reported feeling better, denies any new complaints. 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 Suggestion of bone separation and soft tissue gas at the greater trochanter. This could represent postoperative change or periprosthetic infection. No abscess Ortho on board: appreciate recs, considering another I&D Consider ID consult if fever re-occurs despite on IV AB or if clinical deterioration Continue IV Cefepime, Vancomycin Continue IVF  PAF Rate  controlled Continue Eliquis  HTN BP soft Not on any meds  HLD Continue statins    Code Status: Full  Family Communication: None at bedside  Disposition Plan: To be determined   Consultants:  Orthopedics  Procedures:  None  Antimicrobials:  Vancomycin  Cefepime  DVT prophylaxis: Eliquis   Objective: Vitals:   02/20/18 0143 02/20/18 0245 02/20/18 0424 02/20/18 0454  BP:  (!) 103/50  (!) 103/58  Pulse:  95  (!) 111  Resp:  (!) 22  16  Temp: (!) 102.3 F (39.1 C)  98.2 F (36.8 C) 98 F (36.7 C)  TempSrc: Oral  Oral Oral  SpO2:  93%  97%  Weight:    65.4 kg  Height:    5\' 8"  (1.727 m)    Intake/Output Summary (Last 24 hours) at 02/20/2018 1123 Last data filed at 02/20/2018 1005 Gross per 24 hour  Intake 1475.16 ml  Output 225 ml  Net 1250.16 ml   Filed Weights   02/20/18 0454  Weight: 65.4 kg    Exam:   General: NAD  Cardiovascular: S1, S2 present  Respiratory: CTAB  Abdomen: Soft, NT, ND, BS present  Musculoskeletal: R thigh with mild swelling, wound without drainage  Skin: As mentioned above, otherwise normal  Psychiatry: Normal mood    Data Reviewed: CBC: Recent Labs  Lab 02/18/18 1653 02/20/18 0058  WBC 6.7 8.3  NEUTROABS 4,663 6.9  HGB 10.1* 8.9*  HCT 30.5* 28.0*  MCV 89.7 92.4  PLT 291 291   Basic Metabolic Panel: Recent Labs  Lab 02/20/18 0058  NA 141  K 3.4*  CL 105  CO2 25  GLUCOSE 139*  BUN 13  CREATININE 0.69  CALCIUM 8.5*   GFR: Estimated Creatinine Clearance: 68.1 mL/min (by C-G formula based on SCr of 0.69 mg/dL). Liver Function Tests: Recent Labs  Lab 02/20/18 0058  AST 28  ALT 24  ALKPHOS 74  BILITOT 0.5  PROT 6.0*  ALBUMIN 2.7*   No results for input(s): LIPASE, AMYLASE in the last 168 hours. No results for input(s): AMMONIA in the last 168 hours. Coagulation Profile: Recent Labs  Lab 02/20/18 0058  INR 1.03   Cardiac Enzymes: No results for input(s): CKTOTAL, CKMB, CKMBINDEX,  TROPONINI in the last 168 hours. BNP (last 3 results) No results for input(s): PROBNP in the last 8760 hours. HbA1C: No results for input(s): HGBA1C in the last 72 hours. CBG: No results for input(s): GLUCAP in the last 168 hours. Lipid Profile: No results for input(s): CHOL, HDL, LDLCALC, TRIG, CHOLHDL, LDLDIRECT in the last 72 hours. Thyroid Function Tests: No results for input(s): TSH, T4TOTAL, FREET4, T3FREE, THYROIDAB in the last 72 hours. Anemia Panel: No results for input(s): VITAMINB12, FOLATE, FERRITIN, TIBC, IRON, RETICCTPCT in the last 72 hours. Urine analysis:    Component Value Date/Time   COLORURINE YELLOW 02/20/2018 0056   APPEARANCEUR CLEAR 02/20/2018 0056   LABSPEC 1.015 02/20/2018 0056   PHURINE 7.0 02/20/2018 0056   GLUCOSEU NEGATIVE 02/20/2018 0056   HGBUR NEGATIVE 02/20/2018 0056   BILIRUBINUR NEGATIVE 02/20/2018 0056   KETONESUR NEGATIVE 02/20/2018 0056   PROTEINUR NEGATIVE 02/20/2018 0056   NITRITE NEGATIVE 02/20/2018 0056   LEUKOCYTESUR NEGATIVE 02/20/2018 0056   Sepsis Labs: @LABRCNTIP (procalcitonin:4,lacticidven:4)  ) Recent Results (from the past 240 hour(s))  Blood culture (routine single)     Status: None (Preliminary result)   Collection Time: 02/18/18  5:09 PM  Result Value Ref Range Status   MICRO NUMBER: [id number]  Preliminary   SPECIMEN QUALITY: ADEQUATE  Preliminary   Source BLOOD, LEFT ARM  Preliminary   STATUS: PRELIMINARY  Preliminary   Result:   Preliminary    No growth to date. Culture is continuously monitored for a total of 120 hours incubation. A change in status will result in a phone report followed by an updated printed culture report.   COMMENT: Aerobic and anaerobic bottle received.  Preliminary  Blood culture (routine single)     Status: None (Preliminary result)   Collection Time: 02/18/18  5:12 PM  Result Value Ref Range Status   MICRO NUMBER: [id number]  Preliminary   SPECIMEN QUALITY: SUBOPTIMAL  Preliminary   Source  OTHER (SPECIFY)  Preliminary   STATUS: PRELIMINARY  Preliminary   Result:   Preliminary    No growth to date. Culture is continuously monitored for a total of 120 hours incubation. A change in status will result in a phone report followed by an updated printed culture report. Visual inspection of blood culture bottles indicates that an  inadequate volume of blood may have been collected for the detection of sepsis.    COMMENT: Aerobic and anaerobic bottle received.  Preliminary  Culture, blood (Routine x 2)     Status: None (Preliminary result)   Collection Time: 02/20/18 12:50 AM  Result Value Ref Range Status   Specimen Description   Final    BLOOD RIGHT ARM PICC LINE Performed at [facility], [address] [place], [place] [address]    Special Requests   Final    BOTTLES DRAWN AEROBIC AND ANAEROBIC Blood Culture adequate volume Performed at [facility] [address], [place], [place] [address]  Culture PENDING  Incomplete   Report Status PENDING  Incomplete      Studies: Ct Head Wo Contrast  Result Date: 02/20/2018 CLINICAL DATA:  Acute headaches. Normal neurological examination. EXAM: CT HEAD WITHOUT CONTRAST TECHNIQUE: Contiguous axial images were obtained from the base of the skull through the vertex without intravenous contrast. COMPARISON:  CT head 04/22/2017. MRI brain 04/22/2017. FINDINGS: Brain: Small focal area of encephalomalacia in the right posterior parietal lobe. No change since prior study. Consistent with old infarct. Mild diffuse cerebral atrophy. Ventricular dilatation consistent with central atrophy. Patchy low-attenuation changes in the deep white matter consistent small vessel ischemia. No mass effect or midline shift. No acute intracranial hemorrhage. Gray-white matter junctions are distinct. Basal cisterns are not effaced. No abnormal extra-axial fluid collections. Vascular: Moderate intracranial arterial vascular calcifications are  present. Skull: Calvarium appears intact. Sinuses/Orbits: Paranasal sinuses and mastoid air cells are clear. Other: None. IMPRESSION: No acute intracranial abnormalities. Chronic atrophy and small vessel ischemic changes. 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 Electronically Signed   By: Burman Nieves M.D.   On: 02/20/2018 02:33   Ct Femur Right Wo Contrast  Result Date: 02/20/2018 CLINICAL DATA:  Recent revision of hip report and. Possible infection. EXAM: CT OF THE LOWER RIGHT EXTREMITY WITHOUT CONTRAST TECHNIQUE: Multidetector CT imaging of the right lower extremity was performed according to the standard protocol. COMPARISON:  Pelvis 01/26/2018. MRI femur 12/16/2017. FINDINGS: Bones/Joint/Cartilage Right total hip arthroplasty using non cemented long stem component. Artifact from the metal limits evaluation in some areas. No evidence of acute fracture or dislocation of the femur. No dislocation of the prosthesis. At the lateral aspect of the proximal femoral component as it inserts into the greater trochanter, there is mild separation at the bone-cement interface with tiny gas collections. Depending on the timing of surgery, this could represent postoperative change. If there was no recent surgery, gas and bone loss could indicate prosthetic infection. If clinically indicated, consider FNA for further evaluation. There is an expansile lesion in the right posterior acetabulum and ischium with cortical disruption. Soft tissue changes extend into the adjacent musculature and along the posterior compartment musculature. Fluid or soft tissue changes also demonstrated anterior and medial to the femoral neck with peripheral calcification. Similar changes seen on previous MRI with MRI features reportedly consistent with metalosis and pseudotumor. Ligaments Suboptimally assessed by CT. Muscles and Tendons Complex fluid/soft tissue collections with peripheral calcification in the posterior compartment and  medial hip musculature as described above. Collections extend down to the level of the knee. Soft tissues Vascular calcifications. Mild infiltration of the subcutaneous fat laterally, likely edema or cellulitis. No discrete abscess. IMPRESSION: 1. Right total hip arthroplasty using long-stem component. 2. Suggestion of bone separation and soft tissue gas at the greater trochanter. This could represent postoperative change or periprosthetic infection. No abscess. 3. Complex soft tissue/fluid collections demonstrated around the right hip and right femur associated with an expansile bone lesion in the ischium. These changes were previously seen at MRI and suggested metalosis or hematoma. 4. No acute fracture or dislocation. Electronically Signed   By: Burman Nieves M.D.   On: 02/20/2018 02:44    Scheduled Meds: . apixaban  5 mg Oral BID  . aspirin  81 mg Oral Daily  . atorvastatin  40 mg Oral q1800  . docusate sodium  100 mg Oral BID  . dorzolamide  1 drop Both Eyes Daily   And  . timolol  1 drop Both Eyes  Daily  . finasteride  5 mg Oral Daily  . flecainide  50 mg Oral BID  . iron polysaccharides  150 mg Oral Daily  . latanoprost  1 drop Both Eyes QHS  . multivitamin  1 tablet Oral Daily  . pantoprazole  80 mg Oral Q1200    Continuous Infusions: . sodium chloride 75 mL/hr at 02/20/18 0718  . ceFEPime (MAXIPIME) IV 2 g (02/20/18 1114)  . metronidazole 500 mg (02/20/18 0949)  . [START ON 02/21/2018] vancomycin       LOS: 0 days     Briant Cedar, MD [facility]   If 7PM-7AM, please contact night-coverage [email or web address] Password [email or web address] 02/20/2018, 11:23 AM

## 2018-02-20 NOTE — ED Notes (Signed)
ED TO INPATIENT HANDOFF REPORT  Name/Age/Gender Travis Wolfe 83 y.o. male  Code Status    Code Status Orders  (From admission, onward)         Start     Ordered   02/20/18 0321  Full code  Continuous     02/20/18 0322        Code Status History    Date Active Date Inactive Code Status Order ID Comments User Context   01/26/2018 2042 01/29/2018 1848 Full Code [id number]  Norman Herrlich Inpatient   12/15/2017 1957 12/18/2017 1704 Full Code [id number]  Bonnielee Haff, MD Inpatient      Home/SNF/Other Home  Chief Complaint Fever;Headache  Level of Care/Admitting Diagnosis ED Disposition    ED Disposition Condition [facility]: [place] [[id number]]  Level of Care: Telemetry [5]  Admit to tele based on following criteria: Complex arrhythmia (Bradycardia/Tachycardia)  Diagnosis: Infection of right prosthetic hip joint [facility]) [[id number]]  Admitting Physician: Doreatha Massed  Attending Physician: Etta Quill [phone]  Estimated length of stay: past midnight tomorrow  Certification:: I certify this patient will need inpatient services for at least 2 midnights  PT Class (Do Not Modify): Inpatient [101]  PT Acc Code (Do Not Modify): Private [1]       Medical History Past Medical History:  Diagnosis Date  . A-fib ([place])    REPORTS WAS DUE TO DEHYDRATION 3 YEARS AGO BUT WAS GIVEN FLUIDS IN ED AND MEDS TO SLOW HR AND DROVE HIMSELF HOME  ; HAD ABLATION X2 WITH CARDIO DR. AKBARY AS PRECAUTION  ; has been on eliquis since but reports he has not taken it for 5 weeks b/c he was told to hold it for the multiple irrigations and debridements for the hip infection. reports his cardiologist is aware he has been holding it;   . Arthritis   . Atherosclerosis of both carotid arteries   . BPH (benign prostatic hyperplasia)   . Fall    fell on friday 01-16-18;   lost balAnce whIile tryng to get his sock on ;sustained skin tear to left wrist  ; no drainage  , scabbed over , and has been covering with bandage   . GERD (gastroesophageal reflux disease)   . Heart murmur    SINCE HIS 20s  . HLD (hyperlipidemia)   . Hypertension   . Non-rheumatic mitral regurgitation   . PVC (premature ventricular contraction)     Allergies Allergies  Allergen Reactions  . Other     Cat dander  . Horse Epithelium Rash    IV Location/Drains/Wounds Patient Lines/Drains/Airways Status   Active Line/Drains/Airways    Name:   Placement date:   Placement time:   Site:   Days:   PICC Single Lumen 01/28/18 PICC Right Brachial 36 cm 0 cm   01/28/18    1009    Brachial   23   Incision (Closed) 01/26/18 Hip Right   01/26/18    1739     25          Labs/Imaging Results for orders placed or performed during the hospital encounter of 02/20/18 (from the past 48 hour(s))  Culture, blood (Routine x 2)     Status: None (Preliminary result)   Collection Time: 02/20/18 12:50 AM  Result Value Ref Range   Specimen Description      BLOOD RIGHT ARM PICC LINE Performed at [facility], [place] [address],  [place], [place] [address]    Special Requests      BOTTLES DRAWN AEROBIC AND ANAEROBIC Blood Culture adequate volume Performed at [facility] [address] [place], [place] [address]    Culture PENDING    Report Status PENDING   Urinalysis, Routine w reflex microscopic     Status: None   Collection Time: 02/20/18 12:56 AM  Result Value Ref Range   Color, Urine YELLOW YELLOW   APPearance CLEAR CLEAR   Specific Gravity, Urine 1.015 1.005 - 1.030   pH 7.0 5.0 - 8.0   Glucose, UA NEGATIVE NEGATIVE mg/dL   Hgb urine dipstick NEGATIVE NEGATIVE   Bilirubin Urine NEGATIVE NEGATIVE   Ketones, ur NEGATIVE NEGATIVE mg/dL   Protein, ur NEGATIVE NEGATIVE mg/dL   Nitrite NEGATIVE NEGATIVE   Leukocytes, UA NEGATIVE NEGATIVE    Comment: Performed at [facility], [place] [address] [place], [place] [address]   Comprehensive metabolic panel     Status: Abnormal   Collection Time: 02/20/18 12:58 AM  Result Value Ref Range   Sodium 141 135 - 145 mmol/L   Potassium 3.4 (L) 3.5 - 5.1 mmol/L   Chloride 105 98 - 111 mmol/L   CO2 25 22 - 32 mmol/L   Glucose, Bld 139 (H) 70 - 99 mg/dL   BUN 13 8 - 23 mg/dL   Creatinine, Ser 0.69 0.61 - 1.24 mg/dL   Calcium 8.5 (L) 8.9 - 10.3 mg/dL   Total Protein 6.0 (L) 6.5 - 8.1 g/dL   Albumin 2.7 (L) 3.5 - 5.0 g/dL   AST 28 15 - 41 U/L   ALT 24 0 - 44 U/L   Alkaline Phosphatase 74 38 - 126 U/L   Total Bilirubin 0.5 0.3 - 1.2 mg/dL   GFR calc non Af Amer >60 >60 mL/min   GFR calc Af Amer >60 >60 mL/min    Comment: (NOTE) The eGFR has been calculated using the CKD EPI equation. This calculation has not been validated in all clinical situations. eGFR's persistently <60 mL/min signify possible Chronic Kidney Disease.    Anion gap 11 5 - 15    Comment: Performed at [facility], [place] [address], [place], [place] [address]  CBC with Differential     Status: Abnormal   Collection Time: 02/20/18 12:58 AM  Result Value Ref Range   WBC 8.3 4.0 - 10.5 K/uL   RBC 3.03 (L) 4.22 - 5.81 MIL/uL   Hemoglobin 8.9 (L) 13.0 - 17.0 g/dL   HCT 28.0 (L) 39.0 - 52.0 %   MCV 92.4 78.0 - 100.0 fL   MCH 29.4 26.0 - 34.0 pg   MCHC 31.8 30.0 - 36.0 g/dL   RDW 14.8 11.5 - 15.5 %   Platelets 291 150 - 400 K/uL   Neutrophils Relative % 83 %   Neutro Abs 6.9 1.7 - 7.7 K/uL   Lymphocytes Relative 5 %   Lymphs Abs 0.4 (L) 0.7 - 4.0 K/uL   Monocytes Relative 8 %   Monocytes Absolute 0.6 0.1 - 1.0 K/uL   Eosinophils Relative 4 %   Eosinophils Absolute 0.4 0.0 - 0.7 K/uL   Basophils Relative 0 %   Basophils Absolute 0.0 0.0 - 0.1 K/uL    Comment: Performed at [facility], [place] [address], [place], [place] [address]  Protime-INR     Status: None   Collection Time: 02/20/18 12:58 AM  Result Value Ref Range   Prothrombin Time 13.4  11.4 - 15.2  seconds   INR 1.03     Comment: Performed at [facility], [place] [address] [place], [place] [address]  I-Stat CG4 Lactic Acid, ED     Status: None   Collection Time: 02/20/18  1:07 AM  Result Value Ref Range   Lactic Acid, Venous 1.51 0.5 - 1.9 mmol/L  C-reactive protein     Status: Abnormal   Collection Time: 02/20/18  2:55 AM  Result Value Ref Range   CRP 9.3 (H) <1.0 mg/dL    Comment: Performed at [facility], [place] [address] [place], [place] [address]   Ct Head Wo Contrast  Result Date: 02/20/2018 CLINICAL DATA:  Acute headaches. Normal neurological examination. EXAM: CT HEAD WITHOUT CONTRAST TECHNIQUE: Contiguous axial images were obtained from the base of the skull through the vertex without intravenous contrast. COMPARISON:  CT head 04/22/2017. MRI brain 04/22/2017. FINDINGS: Brain: Small focal area of encephalomalacia in the right posterior parietal lobe. No change since prior study. Consistent with old infarct. Mild diffuse cerebral atrophy. Ventricular dilatation consistent with central atrophy. Patchy low-attenuation changes in the deep white matter consistent small vessel ischemia. No mass effect or midline shift. No acute intracranial hemorrhage. Gray-white matter junctions are distinct. Basal cisterns are not effaced. No abnormal extra-axial fluid collections. Vascular: Moderate intracranial arterial vascular calcifications are present. Skull: Calvarium appears intact. Sinuses/Orbits: Paranasal sinuses and mastoid air cells are clear. Other: None. IMPRESSION: No acute intracranial abnormalities. Chronic atrophy and small vessel ischemic changes. 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 Electronically Signed   By: Lucienne Capers M.D.   On: 02/20/2018 02:33   Ct Femur Right Wo Contrast  Result Date: 02/20/2018 CLINICAL DATA:  Recent revision of hip report and. Possible infection. EXAM: CT OF THE LOWER RIGHT EXTREMITY WITHOUT CONTRAST  TECHNIQUE: Multidetector CT imaging of the right lower extremity was performed according to the standard protocol. COMPARISON:  Pelvis 01/26/2018. MRI femur 12/16/2017. FINDINGS: Bones/Joint/Cartilage Right total hip arthroplasty using non cemented long stem component. Artifact from the metal limits evaluation in some areas. No evidence of acute fracture or dislocation of the femur. No dislocation of the prosthesis. At the lateral aspect of the proximal femoral component as it inserts into the greater trochanter, there is mild separation at the bone-cement interface with tiny gas collections. Depending on the timing of surgery, this could represent postoperative change. If there was no recent surgery, gas and bone loss could indicate prosthetic infection. If clinically indicated, consider FNA for further evaluation. There is an expansile lesion in the right posterior acetabulum and ischium with cortical disruption. Soft tissue changes extend into the adjacent musculature and along the posterior compartment musculature. Fluid or soft tissue changes also demonstrated anterior and medial to the femoral neck with peripheral calcification. Similar changes seen on previous MRI with MRI features reportedly consistent with metalosis and pseudotumor. Ligaments Suboptimally assessed by CT. Muscles and Tendons Complex fluid/soft tissue collections with peripheral calcification in the posterior compartment and medial hip musculature as described above. Collections extend down to the level of the knee. Soft tissues Vascular calcifications. Mild infiltration of the subcutaneous fat laterally, likely edema or cellulitis. No discrete abscess. IMPRESSION: 1. Right total hip arthroplasty using long-stem component. 2. Suggestion of bone separation and soft tissue gas at the greater trochanter. This could represent postoperative change or periprosthetic infection. No abscess. 3. Complex soft tissue/fluid collections demonstrated around  the right hip and right femur associated with an expansile bone  lesion in the ischium. These changes were previously seen at MRI and suggested metalosis or hematoma. 4. No acute fracture or dislocation. Electronically Signed   By: Lucienne Capers M.D.   On: 02/20/2018 02:44   US Venous Img Lower Unilateral Right  Result Date: 02/18/2018 CLINICAL DATA:  84 year old male with right lower extremity pain and swelling. 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 EXAM: RIGHT LOWER EXTREMITY VENOUS DOPPLER ULTRASOUND TECHNIQUE: Gray-scale sonography with graded compression, as well as color Doppler and duplex ultrasound were performed to evaluate the lower extremity deep venous systems from the level of the common femoral vein and including the common femoral, femoral, profunda femoral, popliteal and calf veins including the posterior tibial, peroneal and gastrocnemius veins when visible. The superficial great saphenous vein was also interrogated. Spectral Doppler was utilized to evaluate flow at rest and with distal augmentation maneuvers in the common femoral, femoral and popliteal veins. COMPARISON:  MRI pelvis and lower extremities 12/16/2017 FINDINGS: Contralateral Common Femoral Vein: Respiratory phasicity is normal and symmetric with the symptomatic side. No evidence of thrombus. Normal compressibility. Common Femoral Vein: No evidence of thrombus. Normal compressibility, respiratory phasicity and response to augmentation. Saphenofemoral Junction: No evidence of thrombus. Normal compressibility and flow on color Doppler imaging. Profunda Femoral Vein: No evidence of thrombus. Normal compressibility and flow on color Doppler imaging. Femoral Vein: No evidence of thrombus. Normal compressibility, respiratory phasicity and response to augmentation. Popliteal Vein: No evidence of thrombus. Normal compressibility, respiratory phasicity and response to augmentation. Calf  Veins: No evidence of thrombus. Normal compressibility and flow on color Doppler imaging. Superficial Great Saphenous Vein: No evidence of thrombus. Normal compressibility. Venous Reflux:  None. Other Findings: Elongated complex fluid collection in the posterolateral aspect of the right upper extremity measuring approximately 25 x 2.6 cm. A second complex fluid collection is identified in the posterior aspect of the distal thigh measuring 12.5 x 1.8 x 2.7 cm. IMPRESSION: 1. No evidence of deep venous thrombosis. 2. Multiple large complex fluid collections present in the region of the right hip and posterolateral thigh. Correlation with prior MRI imaging demonstrates similar findings. 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 Most recent prior aspiration of 1 of the fluid collections was performed on 12/17/2017. Electronically Signed   By: Jacqulynn Cadet M.D.   On: 02/18/2018 14:36    Pending Labs Unresulted Labs (From admission, onward)    Start     Ordered   02/20/18 0032  Culture, blood (Routine x 2)  BLOOD CULTURE X 2,   STAT     02/20/18 0031          Vitals/Pain Today's Vitals   02/20/18 0200 02/20/18 0219 02/20/18 0245 02/20/18 0256  BP:   (!) 103/50   Pulse:   95   Resp:   (!) 22   Temp:      TempSrc:      SpO2:   93%   PainSc: 6  6   Asleep    Isolation Precautions No active isolations  Medications Medications  metroNIDAZOLE (FLAGYL) IVPB 500 mg ( Intravenous Stopped 02/20/18 0352)  vancomycin (VANCOCIN) 1,500 mg in sodium chloride 0.9 % 500 mL IVPB (has no administration in time range)  acetaminophen (TYLENOL) tablet 650 mg (has no administration in time range)    Or  acetaminophen (TYLENOL) suppository 650 mg (has no administration in time range)  ondansetron (ZOFRAN) tablet 4 mg (has no administration in time range)  Or  ondansetron (ZOFRAN) injection 4 mg (has no administration in time range)  0.9 %  sodium chloride infusion (has no  administration in time range)  aspirin chewable tablet 81 mg (has no administration in time range)  apixaban (ELIQUIS) tablet 5 mg (has no administration in time range)  atorvastatin (LIPITOR) tablet 40 mg (has no administration in time range)  flecainide (TAMBOCOR) tablet 50 mg (has no administration in time range)  finasteride (PROSCAR) tablet 5 mg (has no administration in time range)  docusate sodium (COLACE) capsule 100 mg (has no administration in time range)  zolpidem (AMBIEN) tablet 5 mg (has no administration in time range)  iron polysaccharides (NIFEREX) capsule 150 mg (has no administration in time range)  HYDROcodone-acetaminophen (NORCO) 7.5-325 MG per tablet 1-2 tablet (has no administration in time range)  pantoprazole (PROTONIX) EC tablet 80 mg (has no administration in time range)  latanoprost (XALATAN) 0.005 % ophthalmic solution 1 drop (has no administration in time range)  multivitamin-lutein (OCUVITE-LUTEIN) capsule 1 capsule (has no administration in time range)  polyethylene glycol (MIRALAX / GLYCOLAX) packet 17 g (has no administration in time range)  dorzolamidel-timolol (COSOPT) 22.3-6.8 MG/ML ophthalmic solution SOLN 1 drop (has no administration in time range)  acetaminophen (TYLENOL) tablet 650 mg (650 mg Oral Given 02/20/18 0055)  ceFEPIme (MAXIPIME) 2 g in sodium chloride 0.9 % 100 mL IVPB ( Intravenous Stopped 02/20/18 0227)

## 2018-02-20 NOTE — Consult Note (Signed)
Reason for Consult:Fever Referring Physician: EDP  Travis Wolfe is an 83 y.o. male.  HPI: Admitted with three nights of fever, chills worse last night after being taken off Vancomycin by ID for suspected drug reaction. 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 Stopped draining three days ago.  Past Medical History:  Diagnosis Date  . A-fib ([place])    REPORTS WAS DUE TO DEHYDRATION 3 YEARS AGO BUT WAS GIVEN FLUIDS IN ED AND MEDS TO SLOW HR AND DROVE HIMSELF HOME  ; HAD ABLATION X2 WITH CARDIO DR. AKBARY AS PRECAUTION  ; has been on eliquis since but reports he has not taken it for 5 weeks b/Wolfe he was told to hold it for the multiple irrigations and debridements for the hip infection. reports his cardiologist is aware he has been holding it;   . Arthritis   . Atherosclerosis of both carotid arteries   . BPH (benign prostatic hyperplasia)   . Fall    fell on friday 01-16-18;   lost balAnce whIile tryng to get his sock on ;sustained skin tear to left wrist ; no drainage  , scabbed over , and has been covering with bandage   . GERD (gastroesophageal reflux disease)   . Heart murmur    SINCE HIS 20s  . HLD (hyperlipidemia)   . Hypertension   . Non-rheumatic mitral regurgitation   . PVC (premature ventricular contraction)     Past Surgical History:  Procedure Laterality Date  . BACK SURGERY  2007   L1-L5 LUMBAR WITH HARDWARE PLACED  . CARDIOVERSION    . CATARACT EXTRACTION, BILATERAL  2015  . CRYOABLATION     CARDIAC ABLATION   . DACROCYSTORHINOSTOMY  07/26/2011  . EYE SURGERY    . HERNIA REPAIR Bilateral   . hx of echocardiogram     . INCISION AND DRAINAGE HIP Right 01/26/2018   Procedure: Right hip irrigation and debridement, excisional and non excisional debridement, head liner exchange, posterior approach;  Surgeon: Paralee Cancel, MD;  Location: [facility];  Service: Orthopedics;  Laterality: Right;  90 mins Would like to start earlier around 2:00pm if time opens  .  INGUINAL HERNIA REPAIR Bilateral    with mesh   . IR US GUIDE BX ASP/DRAIN  12/17/2017  . IRRIGATION AND ASPIRATION RIGHT HIP   12/10/2017   [place]  x2, [place] x1   . KIDNEY STONE SURGERY     lithotripsy   . KYPHOPLASTY     T7  . LATERAL FUSION LUMBAR SPINE     L1-L5  . PULMONARY VEIN ISOLATION AND LEFT ATRIAL ROOFLINE ABLATION   10/15/2017   DR Minna Merritts   . ROTATOR CUFF REPAIR Left   . SHOULDER DEBRIDEMENT Right   . SHOULDER OPEN ROTATOR CUFF REPAIR Left   . SHOULDER SURGERY  1988   arthroscopy    . TONSILLECTOMY    . TOTAL HIP ARTHROPLASTY     done twice  . TOTAL HIP ARTHROPLASTY  2010  . TOTAL HIP REVISION      05-2017, 10-2016    Family History  Problem Relation Age of Onset  . Hypertension Mother   . Cancer Mother     Social History:  reports that he has never smoked. He has never used smokeless tobacco. He reports that he drank alcohol. He reports that he does not use drugs.  Allergies:  Allergies  Allergen Reactions  . Other     Cat dander  . Horse Epithelium Rash  Medications: I have reviewed the patient's current medications.  Results for orders placed or performed during the hospital encounter of 02/20/18 (from the past 48 hour(s))  Culture, blood (Routine x 2)     Status: None (Preliminary result)   Collection Time: 02/20/18 12:50 AM  Result Value Ref Range   Specimen Description      BLOOD RIGHT ARM PICC LINE Performed at [facility], [address] [place], [place] [address]    Special Requests      BOTTLES DRAWN AEROBIC AND ANAEROBIC Blood Culture adequate volume Performed at [facility] [address], [place], [place] [address]    Culture PENDING    Report Status PENDING   Urinalysis, Routine w reflex microscopic     Status: None   Collection Time: 02/20/18 12:56 AM  Result Value Ref Range   Color, Urine YELLOW YELLOW   APPearance CLEAR CLEAR   Specific Gravity, Urine 1.015 1.005 - 1.030   pH 7.0 5.0 - 8.0    Glucose, UA NEGATIVE NEGATIVE mg/dL   Hgb urine dipstick NEGATIVE NEGATIVE   Bilirubin Urine NEGATIVE NEGATIVE   Ketones, ur NEGATIVE NEGATIVE mg/dL   Protein, ur NEGATIVE NEGATIVE mg/dL   Nitrite NEGATIVE NEGATIVE   Leukocytes, UA NEGATIVE NEGATIVE    Comment: Performed at [facility], [place] [address] [place], [place] [address]  Comprehensive metabolic panel     Status: Abnormal   Collection Time: 02/20/18 12:58 AM  Result Value Ref Range   Sodium 141 135 - 145 mmol/L   Potassium 3.4 (L) 3.5 - 5.1 mmol/L   Chloride 105 98 - 111 mmol/L   CO2 25 22 - 32 mmol/L   Glucose, Bld 139 (H) 70 - 99 mg/dL   BUN 13 8 - 23 mg/dL   Creatinine, Ser 0.69 0.61 - 1.24 mg/dL   Calcium 8.5 (L) 8.9 - 10.3 mg/dL   Total Protein 6.0 (L) 6.5 - 8.1 g/dL   Albumin 2.7 (L) 3.5 - 5.0 g/dL   AST 28 15 - 41 U/L   ALT 24 0 - 44 U/L   Alkaline Phosphatase 74 38 - 126 U/L   Total Bilirubin 0.5 0.3 - 1.2 mg/dL   GFR calc non Af Amer >60 >60 mL/min   GFR calc Af Amer >60 >60 mL/min    Comment: (NOTE) The eGFR has been calculated using the CKD EPI equation. This calculation has not been validated in all clinical situations. eGFR's persistently <60 mL/min signify possible Chronic Kidney Disease.    Anion gap 11 5 - 15    Comment: Performed at [facility], [place] [address] [place], [place] [address]  CBC with Differential     Status: Abnormal   Collection Time: 02/20/18 12:58 AM  Result Value Ref Range   WBC 8.3 4.0 - 10.5 K/uL   RBC 3.03 (L) 4.22 - 5.81 MIL/uL   Hemoglobin 8.9 (L) 13.0 - 17.0 g/dL   HCT 28.0 (L) 39.0 - 52.0 %   MCV 92.4 78.0 - 100.0 fL   MCH 29.4 26.0 - 34.0 pg   MCHC 31.8 30.0 - 36.0 g/dL   RDW 14.8 11.5 - 15.5 %   Platelets 291 150 - 400 K/uL   Neutrophils Relative % 83 %   Neutro Abs 6.9 1.7 - 7.7 K/uL   Lymphocytes Relative 5 %   Lymphs Abs 0.4 (L) 0.7 - 4.0 K/uL   Monocytes Relative 8 %   Monocytes Absolute 0.6  0.1 - 1.0 K/uL   Eosinophils  Relative 4 %   Eosinophils Absolute 0.4 0.0 - 0.7 K/uL   Basophils Relative 0 %   Basophils Absolute 0.0 0.0 - 0.1 K/uL    Comment: Performed at [facility], [place] [address], [place], [place] [address]  Protime-INR     Status: None   Collection Time: 02/20/18 12:58 AM  Result Value Ref Range   Prothrombin Time 13.4 11.4 - 15.2 seconds   INR 1.03     Comment: Performed at [facility], [place] [address], [place], [place] [address]  I-Stat CG4 Lactic Acid, ED     Status: None   Collection Time: 02/20/18  1:07 AM  Result Value Ref Range   Lactic Acid, Venous 1.51 0.5 - 1.9 mmol/L  Wolfe-reactive protein     Status: Abnormal   Collection Time: 02/20/18  2:55 AM  Result Value Ref Range   CRP 9.3 (H) <1.0 mg/dL    Comment: Performed at [facility], [place] [address] [place], [place] [address]    Ct Head Wo Contrast  Result Date: 02/20/2018 CLINICAL DATA:  Acute headaches. Normal neurological examination. EXAM: CT HEAD WITHOUT CONTRAST TECHNIQUE: Contiguous axial images were obtained from the base of the skull through the vertex without intravenous contrast. COMPARISON:  CT head 04/22/2017. MRI brain 04/22/2017. FINDINGS: Brain: Small focal area of encephalomalacia in the right posterior parietal lobe. No change since prior study. Consistent with old infarct. Mild diffuse cerebral atrophy. Ventricular dilatation consistent with central atrophy. Patchy low-attenuation changes in the deep white matter consistent small vessel ischemia. No mass effect or midline shift. No acute intracranial hemorrhage. Gray-white matter junctions are distinct. Basal cisterns are not effaced. No abnormal extra-axial fluid collections. Vascular: Moderate intracranial arterial vascular calcifications are present. Skull: Calvarium appears intact. Sinuses/Orbits: Paranasal sinuses and mastoid air cells are clear. Other: None. IMPRESSION: No acute intracranial abnormalities.  Chronic atrophy and small vessel ischemic changes. 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 Electronically Signed   By: Lucienne Capers M.D.   On: 02/20/2018 02:33   Ct Femur Right Wo Contrast  Result Date: 02/20/2018 CLINICAL DATA:  Recent revision of hip report and. Possible infection. EXAM: CT OF THE LOWER RIGHT EXTREMITY WITHOUT CONTRAST TECHNIQUE: Multidetector CT imaging of the right lower extremity was performed according to the standard protocol. COMPARISON:  Pelvis 01/26/2018. MRI femur 12/16/2017. FINDINGS: Bones/Joint/Cartilage Right total hip arthroplasty using non cemented long stem component. Artifact from the metal limits evaluation in some areas. No evidence of acute fracture or dislocation of the femur. No dislocation of the prosthesis. At the lateral aspect of the proximal femoral component as it inserts into the greater trochanter, there is mild separation at the bone-cement interface with tiny gas collections. Depending on the timing of surgery, this could represent postoperative change. If there was no recent surgery, gas and bone loss could indicate prosthetic infection. If clinically indicated, consider FNA for further evaluation. There is an expansile lesion in the right posterior acetabulum and ischium with cortical disruption. Soft tissue changes extend into the adjacent musculature and along the posterior compartment musculature. Fluid or soft tissue changes also demonstrated anterior and medial to the femoral neck with peripheral calcification. Similar changes seen on previous MRI with MRI features reportedly consistent with metalosis and pseudotumor. Ligaments Suboptimally assessed by CT. Muscles and Tendons Complex fluid/soft tissue collections with peripheral calcification in the posterior compartment and medial hip musculature as described above. Collections  extend down to the level of the knee. Soft tissues Vascular calcifications. Mild infiltration of the subcutaneous  fat laterally, likely edema or cellulitis. No discrete abscess. IMPRESSION: 1. Right total hip arthroplasty using long-stem component. 2. Suggestion of bone separation and soft tissue gas at the greater trochanter. This could represent postoperative change or periprosthetic infection. No abscess. 3. Complex soft tissue/fluid collections demonstrated around the right hip and right femur associated with an expansile bone lesion in the ischium. These changes were previously seen at MRI and suggested metalosis or hematoma. 4. No acute fracture or dislocation. Electronically Signed   By: Lucienne Capers M.D.   On: 02/20/2018 02:44   US Venous Img Lower Unilateral Right  Result Date: 02/18/2018 CLINICAL DATA:  83 year old male with right lower extremity pain and swelling. 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 EXAM: RIGHT LOWER EXTREMITY VENOUS DOPPLER ULTRASOUND TECHNIQUE: Gray-scale sonography with graded compression, as well as color Doppler and duplex ultrasound were performed to evaluate the lower extremity deep venous systems from the level of the common femoral vein and including the common femoral, femoral, profunda femoral, popliteal and calf veins including the posterior tibial, peroneal and gastrocnemius veins when visible. The superficial great saphenous vein was also interrogated. Spectral Doppler was utilized to evaluate flow at rest and with distal augmentation maneuvers in the common femoral, femoral and popliteal veins. COMPARISON:  MRI pelvis and lower extremities 12/16/2017 FINDINGS: Contralateral Common Femoral Vein: Respiratory phasicity is normal and symmetric with the symptomatic side. No evidence of thrombus. Normal compressibility. Common Femoral Vein: No evidence of thrombus. Normal compressibility, respiratory phasicity and response to augmentation. Saphenofemoral Junction: No evidence of thrombus. Normal compressibility and flow on color  Doppler imaging. Profunda Femoral Vein: No evidence of thrombus. Normal compressibility and flow on color Doppler imaging. Femoral Vein: No evidence of thrombus. Normal compressibility, respiratory phasicity and response to augmentation. Popliteal Vein: No evidence of thrombus. Normal compressibility, respiratory phasicity and response to augmentation. Calf Veins: No evidence of thrombus. Normal compressibility and flow on color Doppler imaging. Superficial Great Saphenous Vein: No evidence of thrombus. Normal compressibility. Venous Reflux:  None. Other Findings: Elongated complex fluid collection in the posterolateral aspect of the right upper extremity measuring approximately 25 x 2.6 cm. A second complex fluid collection is identified in the posterior aspect of the distal thigh measuring 12.5 x 1.8 x 2.7 cm. IMPRESSION: 1. No evidence of deep venous thrombosis. 2. Multiple large complex fluid collections present in the region of the right hip and posterolateral thigh. Correlation with prior MRI imaging demonstrates similar findings. 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 Most recent prior aspiration of 1 of the fluid collections was performed on 12/17/2017. Electronically Signed   By: Jacqulynn Cadet M.D.   On: 02/18/2018 14:36    Review of Systems  Constitutional: Positive for chills and fever.  Musculoskeletal: Positive for joint pain.  All other systems reviewed and are negative.  Blood pressure (!) 103/58, pulse (!) 111, temperature 98 F (36.7 Wolfe), temperature source Oral, resp. rate 16, height _0  (1.727 m), weight 65.4 kg, SpO2 97 %. Physical Exam  Constitutional: He is oriented to person, place, and time. He appears well-developed.  HENT:  Head: Normocephalic.  Eyes: Pupils are equal, round, and reactive to light.  Neck: Normal range of motion.  Cardiovascular: Normal rate.  Respiratory: Effort normal.  GI: Soft.  Musculoskeletal: He exhibits edema.   Right thigh with mild swelling. Wound with  1cm granulating wound no drainage. Soft. NVI. Compartments soft. No fluctulance.   Neurological: He is alert and oriented to person, place, and time.  Skin: Skin is warm and dry.    Assessment/Plan:  Infected right hip arthroplasty with fever , chills off Vancomycin. On IV AB. No organism ID. Agree with resuming Vancomycin. Blood cultures pending. CT scan thigh pending. May require repeat I&D. No current drainage. WBC normal. Currently afebrile. Will discuss with Dr. Alvan Dame.  Travis Wolfe 02/20/2018, 7:24 AM

## 2018-02-20 NOTE — ED Notes (Signed)
Patient transported to CT 

## 2018-02-20 NOTE — H&P (Signed)
History and Physical    Travis Wolfe LEX:[id number] DOB: 05/10/1935 DOA: 02/20/2018  PCP: Derrill Center., MD  Patient coming from: Home  I have personally briefly reviewed patient's old medical records in [place]  Chief Complaint: Fever, headache  HPI: Travis Wolfe is a 83 y.o. male with medical history significant of PAF on eliquis, HTN.  Patient has had numerous complications following a R THA some years ago, this followed by multiple other surgeries, wash outs, revisions.  He was most recently admitted with suspected PJI last month.  Underwent washout.  Put on rocephin and vanc.  Fevers persisted.  Taken off the vanc x2 days ago as Dr. Baxter Flattery thought it might be vanc causing fevers.  Today presents to ED after having fever again.  Cultures from last wash out have been negative as far as I can see.  Per Dr. Aurea Graff op note 8/12 it looks like there is a CNS culture from an aspiration at some point recently (I dont see exactly where in the chart) but cultures otherwise negative.   ED Course: Tm 103.x.  WBC 8.3k.  CT R hip shows Suggestion of bone separation and soft tissue gas at the greater trochanter. This could represent postoperative change or periprosthetic infection. No abscess.  Complex soft tissue/fluid collections demonstrated around the right hip and right femur associated with an expansile bone lesion in the ischium. These changes were previously seen at MRI and suggested metalosis or hematoma.   Review of Systems: As per HPI otherwise 10 point review of systems negative.   Past Medical History:  Diagnosis Date  . A-fib ([place])    REPORTS WAS DUE TO DEHYDRATION 3 YEARS AGO BUT WAS GIVEN FLUIDS IN ED AND MEDS TO SLOW HR AND DROVE HIMSELF HOME  ; HAD ABLATION X2 WITH CARDIO DR. AKBARY AS PRECAUTION  ; has been on eliquis since but reports he has not taken it for 5 weeks b/c he was told to hold it for the multiple irrigations and debridements for the hip infection.  reports his cardiologist is aware he has been holding it;   . Arthritis   . Atherosclerosis of both carotid arteries   . BPH (benign prostatic hyperplasia)   . Fall    fell on friday 01-16-18;   lost balAnce whIile tryng to get his sock on ;sustained skin tear to left wrist ; no drainage  , scabbed over , and has been covering with bandage   . GERD (gastroesophageal reflux disease)   . Heart murmur    SINCE HIS 20s  . HLD (hyperlipidemia)   . Hypertension   . Non-rheumatic mitral regurgitation   . PVC (premature ventricular contraction)     Past Surgical History:  Procedure Laterality Date  . BACK SURGERY  2007   L1-L5 LUMBAR WITH HARDWARE PLACED  . CARDIOVERSION    . CATARACT EXTRACTION, BILATERAL  2015  . CRYOABLATION     CARDIAC ABLATION   . DACROCYSTORHINOSTOMY  07/26/2011  . EYE SURGERY    . HERNIA REPAIR Bilateral   . hx of echocardiogram     . INCISION AND DRAINAGE HIP Right 01/26/2018   Procedure: Right hip irrigation and debridement, excisional and non excisional debridement, head liner exchange, posterior approach;  Surgeon: Paralee Cancel, MD;  Location: [facility];  Service: Orthopedics;  Laterality: Right;  90 mins Would like to start earlier around 2:00pm if time opens  . INGUINAL HERNIA REPAIR Bilateral    with mesh   .  IR US GUIDE BX ASP/DRAIN  12/17/2017  . IRRIGATION AND ASPIRATION RIGHT HIP   12/10/2017   [place]  x2, [place] x1   . KIDNEY STONE SURGERY     lithotripsy   . KYPHOPLASTY     T7  . LATERAL FUSION LUMBAR SPINE     L1-L5  . PULMONARY VEIN ISOLATION AND LEFT ATRIAL ROOFLINE ABLATION   10/15/2017   DR Minna Merritts   . ROTATOR CUFF REPAIR Left   . SHOULDER DEBRIDEMENT Right   . SHOULDER OPEN ROTATOR CUFF REPAIR Left   . SHOULDER SURGERY  1988   arthroscopy    . TONSILLECTOMY    . TOTAL HIP ARTHROPLASTY     done twice  . TOTAL HIP ARTHROPLASTY  2010  . TOTAL HIP REVISION      05-2017, 10-2016     reports that he has never smoked. He has never used  smokeless tobacco. He reports that he drank alcohol. He reports that he does not use drugs.  Allergies  Allergen Reactions  . Other     Cat dander  . Horse Epithelium Rash    Family History  Problem Relation Age of Onset  . Hypertension Mother   . Cancer Mother      Prior to Admission medications   Medication Sig Start Date End Date Taking? Authorizing Provider  apixaban (ELIQUIS) 5 MG TABS tablet Take 5 mg by mouth 2 (two) times daily.  10/27/17  Yes [provider]  aspirin (ASPIRIN CHILDRENS) 81 MG chewable tablet Chew 1 tablet (81 mg total) by mouth 2 (two) times daily. Take for 4 weeks, then resume regular dose. 01/30/18 03/01/18 Yes Babish, Rodman Key, PA-C  atorvastatin (LIPITOR) 40 MG tablet Take 40 mg by mouth daily. 12/10/17  Yes [provider]  cefTRIAXone (ROCEPHIN) IVPB Inject 2 g into the vein daily. Indication:  Prosthetic joint infection Last Day of Therapy:  03/10/2018 Labs - Once weekly:  CBC/D and BMP, Labs - Every other week:  ESR and CRP 01/29/18 03/10/18 Yes Babish, Matthew, PA-C  COSOPT PF 22.3-6.8 MG/ML SOLN ophthalmic solution Place 1 drop into both eyes every morning. 10/22/17  Yes [provider]  docusate sodium (COLACE) 100 MG capsule Take 1 capsule (100 mg total) by mouth 2 (two) times daily. 01/29/18  Yes Babish, Rodman Key, PA-C  Eszopiclone 3 MG TABS Take 3 mg by mouth at bedtime. 02/12/18  Yes [provider]  finasteride (PROSCAR) 5 MG tablet Take 5 mg by mouth daily.   Yes [provider]  flecainide (TAMBOCOR) 50 MG tablet Take 50 mg by mouth 2 (two) times daily.   Yes [provider]  furosemide (LASIX) 20 MG tablet Take 20 mg by mouth daily.   Yes [provider]  HYDROcodone-acetaminophen (NORCO) 7.5-325 MG tablet Take 1-2 tablets by mouth every 4 (four) hours as needed for moderate pain. 01/29/18  Yes Babish, Rodman Key, PA-C  iron polysaccharides (NIFEREX) 150 MG capsule Take 150 mg by mouth daily.    Yes [provider]  LUMIGAN 0.01 % SOLN Place 1 drop into both eyes at bedtime.  10/22/12  Yes [provider]  LYCOPENE PO Take 1 tablet by mouth daily.   Yes [provider]  MAGNESIUM CARBONATE PO Take 1 tablet by mouth daily.   Yes [provider]  methocarbamol (ROBAXIN) 500 MG tablet Take 1 tablet (500 mg total) by mouth every 6 (six) hours as needed for muscle spasms. 01/29/18  Yes Danae Orleans, PA-C  multivitamin-lutein (OCUVITE-LUTEIN) CAPS capsule Take 1 capsule by mouth daily.   Yes [provider]  NARCAN 4 MG/0.1ML LIQD nasal spray kit Place 1 spray into the nose once.  01/30/18  Yes [provider]  NEXIUM 40 MG capsule Take 40 mg by mouth daily.  11/19/12  Yes [provider]  polyethylene glycol (MIRALAX / GLYCOLAX) packet Take 17 g by mouth 2 (two) times daily. Patient taking differently: Take 17 g by mouth daily as needed for mild constipation.  01/29/18  Yes Babish, Rodman Key, PA-C  tadalafil (CIALIS) 5 MG tablet TAKE ONE TABLET BY MOUTH ONCE DAILY 07/21/17  Yes [provider]  Zinc Acetate, Oral, (ZINC ACETATE PO) Take 1 tablet by mouth daily.   Yes [provider]  vancomycin IVPB Inject 1,000 mg into the vein daily. Indication:  Prosthetic joint infection Last Day of Therapy:  03/10/2018 Labs - Sunday/Monday:  CBC/D, BMP, and vancomycin trough. Labs - Thursday:  BMP and vancomycin trough Labs - Every other week:  ESR and CRP Patient not taking: Reported on 02/18/2018 01/29/18 03/10/18  Danae Orleans, PA-C    Physical Exam: Vitals:   02/20/18 0038 02/20/18 0137 02/20/18 0143 02/20/18 0245  BP: (!) 129/56 (!) 117/54  (!) 103/50  Pulse: (!) 105 (!) 102  95  Resp:  (!) 21  (!) 22  Temp: (!) 103.2 F (39.6 C)  (!) 102.3 F (39.1 C)   TempSrc: Oral  Oral   SpO2: 95% 95%  93%    Constitutional: NAD, calm, comfortable Eyes: PERRL, lids and conjunctivae normal ENMT: Mucous membranes are moist.  Posterior pharynx clear of any exudate or lesions.Normal dentition.  Neck: normal, supple, no masses, no thyromegaly Respiratory: clear to auscultation bilaterally, no wheezing, no crackles. Normal respiratory effort. No accessory muscle use.  Cardiovascular: Regular rate and rhythm, no murmurs / rubs / gallops. No extremity edema. 2+ pedal pulses. No carotid bruits.  Abdomen: no tenderness, no masses palpated. No hepatosplenomegaly. Bowel sounds positive.  Musculoskeletal: R hip edema, no erythema Skin: no rashes, lesions, ulcers. No induration Neurologic: CN 2-12 grossly intact. Sensation intact, DTR normal. Strength 5/5 in all 4.  Psychiatric: Normal judgment and insight. Alert and oriented x 3. Normal mood.    Labs on Admission: I have personally reviewed following labs and imaging studies  CBC: Recent Labs  Lab 02/18/18 1653 02/20/18 0058  WBC 6.7 8.3  NEUTROABS 4,663 6.9  HGB 10.1* 8.9*  HCT 30.5* 28.0*  MCV 89.7 92.4  PLT 291 458   Basic Metabolic Panel: Recent Labs  Lab 02/20/18 0058  NA 141  K 3.4*  CL 105  CO2 25  GLUCOSE 139*  BUN 13  CREATININE 0.69  CALCIUM 8.5*   GFR: Estimated Creatinine Clearance: 63.3 mL/min (by C-G formula based on SCr of 0.69 mg/dL). Liver Function Tests: Recent Labs  Lab 02/20/18 0058  AST 28  ALT 24  ALKPHOS 74  BILITOT 0.5  PROT 6.0*  ALBUMIN 2.7*   No results for input(s): LIPASE, AMYLASE in the last 168 hours. No results for input(s): AMMONIA in the last 168 hours. Coagulation Profile: Recent Labs  Lab 02/20/18 0058  INR 1.03   Cardiac Enzymes: No results for input(s): CKTOTAL, CKMB, CKMBINDEX, TROPONINI in the last 168 hours. BNP (last 3 results) No results for input(s): PROBNP in the last 8760 hours. HbA1C: No results for input(s): HGBA1C in the last 72 hours. CBG: No results for input(s): GLUCAP in the last 168 hours. Lipid Profile:  No results for input(s): CHOL, HDL, LDLCALC, TRIG, CHOLHDL, LDLDIRECT in  the last 72 hours. Thyroid Function Tests: No results for input(s): TSH, T4TOTAL, FREET4, T3FREE, THYROIDAB in the last 72 hours. Anemia Panel: No results for input(s): VITAMINB12, FOLATE, FERRITIN, TIBC, IRON, RETICCTPCT in the last 72 hours. Urine analysis:    Component Value Date/Time   COLORURINE YELLOW 02/20/2018 0056   APPEARANCEUR CLEAR 02/20/2018 0056   LABSPEC 1.015 02/20/2018 0056   PHURINE 7.0 02/20/2018 0056   GLUCOSEU NEGATIVE 02/20/2018 0056   HGBUR NEGATIVE 02/20/2018 0056   BILIRUBINUR NEGATIVE 02/20/2018 0056   KETONESUR NEGATIVE 02/20/2018 0056   PROTEINUR NEGATIVE 02/20/2018 0056   NITRITE NEGATIVE 02/20/2018 0056   LEUKOCYTESUR NEGATIVE 02/20/2018 0056    Radiological Exams on Admission: Ct Head Wo Contrast  Result Date: 02/20/2018 CLINICAL DATA:  Acute headaches. Normal neurological examination. EXAM: CT HEAD WITHOUT CONTRAST TECHNIQUE: Contiguous axial images were obtained from the base of the skull through the vertex without intravenous contrast. COMPARISON:  CT head 04/22/2017. MRI brain 04/22/2017. FINDINGS: Brain: Small focal area of encephalomalacia in the right posterior parietal lobe. No change since prior study. Consistent with old infarct. Mild diffuse cerebral atrophy. Ventricular dilatation consistent with central atrophy. Patchy low-attenuation changes in the deep white matter consistent small vessel ischemia. No mass effect or midline shift. No acute intracranial hemorrhage. Gray-white matter junctions are distinct. Basal cisterns are not effaced. No abnormal extra-axial fluid collections. Vascular: Moderate intracranial arterial vascular calcifications are present. Skull: Calvarium appears intact. Sinuses/Orbits: Paranasal sinuses and mastoid air cells are clear. Other: None. IMPRESSION: No acute intracranial abnormalities. Chronic atrophy and small vessel ischemic changes. 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 Electronically Signed   By: Lucienne Capers M.D.   On: 02/20/2018 02:33   Ct Femur Right Wo Contrast  Result Date: 02/20/2018 CLINICAL DATA:  Recent revision of hip report and. Possible infection. EXAM: CT OF THE LOWER RIGHT EXTREMITY WITHOUT CONTRAST TECHNIQUE: Multidetector CT imaging of the right lower extremity was performed according to the standard protocol. COMPARISON:  Pelvis 01/26/2018. MRI femur 12/16/2017. FINDINGS: Bones/Joint/Cartilage Right total hip arthroplasty using non cemented long stem component. Artifact from the metal limits evaluation in some areas. No evidence of acute fracture or dislocation of the femur. No dislocation of the prosthesis. At the lateral aspect of the proximal femoral component as it inserts into the greater trochanter, there is mild separation at the bone-cement interface with tiny gas collections. Depending on the timing of surgery, this could represent postoperative change. If there was no recent surgery, gas and bone loss could indicate prosthetic infection. If clinically indicated, consider FNA for further evaluation. There is an expansile lesion in the right posterior acetabulum and ischium with cortical disruption. Soft tissue changes extend into the adjacent musculature and along the posterior compartment musculature. Fluid or soft tissue changes also demonstrated anterior and medial to the femoral neck with peripheral calcification. Similar changes seen on previous MRI with MRI features reportedly consistent with metalosis and pseudotumor. Ligaments Suboptimally assessed by CT. Muscles and Tendons Complex fluid/soft tissue collections with peripheral calcification in the posterior compartment and medial hip musculature as described above. Collections extend down to the level of the knee. Soft tissues Vascular calcifications. Mild infiltration of the subcutaneous fat laterally, likely edema or cellulitis. No discrete abscess. IMPRESSION: 1. Right total hip arthroplasty using long-stem component. 2.  Suggestion of bone separation and soft tissue gas at the greater trochanter. This could represent postoperative change or periprosthetic  infection. No abscess. 3. Complex soft tissue/fluid collections demonstrated around the right hip and right femur associated with an expansile bone lesion in the ischium. These changes were previously seen at MRI and suggested metalosis or hematoma. 4. No acute fracture or dislocation. Electronically Signed   By: Lucienne Capers M.D.   On: 02/20/2018 02:44   US Venous Img Lower Unilateral Right  Result Date: 02/18/2018 CLINICAL DATA:  83 year old male with right lower extremity pain and swelling. 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 EXAM: RIGHT LOWER EXTREMITY VENOUS DOPPLER ULTRASOUND TECHNIQUE: Gray-scale sonography with graded compression, as well as color Doppler and duplex ultrasound were performed to evaluate the lower extremity deep venous systems from the level of the common femoral vein and including the common femoral, femoral, profunda femoral, popliteal and calf veins including the posterior tibial, peroneal and gastrocnemius veins when visible. The superficial great saphenous vein was also interrogated. Spectral Doppler was utilized to evaluate flow at rest and with distal augmentation maneuvers in the common femoral, femoral and popliteal veins. COMPARISON:  MRI pelvis and lower extremities 12/16/2017 FINDINGS: Contralateral Common Femoral Vein: Respiratory phasicity is normal and symmetric with the symptomatic side. No evidence of thrombus. Normal compressibility. Common Femoral Vein: No evidence of thrombus. Normal compressibility, respiratory phasicity and response to augmentation. Saphenofemoral Junction: No evidence of thrombus. Normal compressibility and flow on color Doppler imaging. Profunda Femoral Vein: No evidence of thrombus. Normal compressibility and flow on color Doppler imaging. Femoral Vein: No  evidence of thrombus. Normal compressibility, respiratory phasicity and response to augmentation. Popliteal Vein: No evidence of thrombus. Normal compressibility, respiratory phasicity and response to augmentation. Calf Veins: No evidence of thrombus. Normal compressibility and flow on color Doppler imaging. Superficial Great Saphenous Vein: No evidence of thrombus. Normal compressibility. Venous Reflux:  None. Other Findings: Elongated complex fluid collection in the posterolateral aspect of the right upper extremity measuring approximately 25 x 2.6 cm. A second complex fluid collection is identified in the posterior aspect of the distal thigh measuring 12.5 x 1.8 x 2.7 cm. IMPRESSION: 1. No evidence of deep venous thrombosis. 2. Multiple large complex fluid collections present in the region of the right hip and posterolateral thigh. Correlation with prior MRI imaging demonstrates similar findings. 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 Most recent prior aspiration of 1 of the fluid collections was performed on 12/17/2017. Electronically Signed   By: Jacqulynn Cadet M.D.   On: 02/18/2018 14:36    EKG: Independently reviewed.  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 Fever - PJI vs Metallosis 1. Continue cefepime / flagyl / vanc for now until ID can weigh in this morning 2. ID consult in AM 3. Ortho to see in AM 4. Tylenol PRN fever 5. BCx pending 2. PAF - 1. Cont eliquis 2. Cont flecanide  DVT prophylaxis: Eliquis Code Status: Full Family Communication: Wife at bedside Disposition Plan: Home after admit Consults called: Ortho consulted and will see in AM Admission status: Admit to inpatient   Etta Quill DO [facility] Pager [phone] Only works nights!  If 7AM-7PM, please contact the primary day team  physician taking care of patient  [email or web address] Password [facility]  02/20/2018, 3:44 AM

## 2018-02-20 NOTE — ED Notes (Signed)
Pt is alert and oriented. Wife is in the room with him. Very friendly. Pt was informed of plan of care.

## 2018-02-20 NOTE — ED Provider Notes (Signed)
[facility] DEPT Provider Note   CSN: [id number] Arrival date & time: 02/20/18  0025     History   Chief Complaint Chief Complaint  Patient presents with  . Fever  . Headache    HPI Travis Wolfe is a 83 y.o. male.  HPI 83 year old male comes in with chief complaint of fevers and chills.  Patient has history of A. fib, BPH, hyperlipidemia, hypertension and he appears to have had infection at his right arthroplasty site.  Patient had a shot of his right hip on 8/12, and although the cultures have not grown any microbes, patient has been placed on ceftriaxone and vancomycin for 6 weeks.  Patient reports that few days ago he was seen by Dr. Alvan Dame in the clinic.  Patient was complaining of swelling in his right thigh and cough.  Ultrasound DVT and CT PE at been ordered, along with aspiration of fluid done from the thigh which allegedly was not purulent.  Since then, patient has developed fevers.  He saw Dr. Graylon Good, infectious disease.  It was thought that 1 of the possibilities could be vancomycin, therefore it was discontinued.  Patient did not have a fever yesterday, however today he started having high temperature with Rigors.  Patient also has been having intermittent headaches, that are diffuse and located over the occiput.  Patient denies any neck stiffness or pain.  Review of system is negative for any altered mental status, seizure-like activity, slurred speech, vision changes, focal numbness or weakness.  The patient's cough has been nonproductive.  He denies any chest pain, shortness of breath, abdominal pain, vomiting, diarrhea, new rash.  He does report that his right leg is swollen again, and that he has had increased pain in the hip with increased difficulty in ambulating due to pain.   Past Medical History:  Diagnosis Date  . A-fib ([place])    REPORTS WAS DUE TO DEHYDRATION 3 YEARS AGO BUT WAS GIVEN FLUIDS IN ED AND MEDS TO SLOW HR AND DROVE  HIMSELF HOME  ; HAD ABLATION X2 WITH CARDIO DR. AKBARY AS PRECAUTION  ; has been on eliquis since but reports he has not taken it for 5 weeks b/c he was told to hold it for the multiple irrigations and debridements for the hip infection. reports his cardiologist is aware he has been holding it;   . Arthritis   . Atherosclerosis of both carotid arteries   . BPH (benign prostatic hyperplasia)   . Fall    fell on friday 01-16-18;   lost balAnce whIile tryng to get his sock on ;sustained skin tear to left wrist ; no drainage  , scabbed over , and has been covering with bandage   . GERD (gastroesophageal reflux disease)   . Heart murmur    SINCE HIS 20s  . HLD (hyperlipidemia)   . Hypertension   . Non-rheumatic mitral regurgitation   . PVC (premature ventricular contraction)     Patient Active Problem List   Diagnosis Date Noted  . Metallosis   . Staphylococcus infection   . 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 S/P hip replacement 01/26/2018  . Cellulitis of right leg 12/15/2017  . PAF (paroxysmal atrial fibrillation) ([place]) 12/15/2017  . Abdominal wall pain in right lower quadrant 12/02/2012    Past Surgical History:  Procedure Laterality Date  . BACK SURGERY  2007   L1-L5 LUMBAR WITH HARDWARE PLACED  . CARDIOVERSION    . CATARACT EXTRACTION,  BILATERAL  2015  . CRYOABLATION     CARDIAC ABLATION   . DACROCYSTORHINOSTOMY  07/26/2011  . EYE SURGERY    . HERNIA REPAIR Bilateral   . hx of echocardiogram     . INCISION AND DRAINAGE HIP Right 01/26/2018   Procedure: Right hip irrigation and debridement, excisional and non excisional debridement, head liner exchange, posterior approach;  Surgeon: Paralee Cancel, MD;  Location: [facility] ORS;  Service: Orthopedics;  Laterality: Right;  90 mins Would like to start earlier around 2:00pm if time opens  . INGUINAL HERNIA REPAIR Bilateral    with mesh   . IR US GUIDE BX ASP/DRAIN  12/17/2017  . IRRIGATION AND ASPIRATION RIGHT HIP    12/10/2017   [place]  x2, [place] x1   . KIDNEY STONE SURGERY     lithotripsy   . KYPHOPLASTY     T7  . LATERAL FUSION LUMBAR SPINE     L1-L5  . PULMONARY VEIN ISOLATION AND LEFT ATRIAL ROOFLINE ABLATION   10/15/2017   DR Minna Merritts   . ROTATOR CUFF REPAIR Left   . SHOULDER DEBRIDEMENT Right   . SHOULDER OPEN ROTATOR CUFF REPAIR Left   . SHOULDER SURGERY  1988   arthroscopy    . TONSILLECTOMY    . TOTAL HIP ARTHROPLASTY     done twice  . TOTAL HIP ARTHROPLASTY  2010  . TOTAL HIP REVISION      05-2017, 10-2016        Home Medications    Prior to Admission medications   Medication Sig Start Date End Date Taking? Authorizing Provider  apixaban (ELIQUIS) 5 MG TABS tablet Take 5 mg by mouth 2 (two) times daily.  10/27/17  Yes [provider]  aspirin (ASPIRIN CHILDRENS) 81 MG chewable tablet Chew 1 tablet (81 mg total) by mouth 2 (two) times daily. Take for 4 weeks, then resume regular dose. 01/30/18 03/01/18 Yes Babish, Rodman Key, PA-C  atorvastatin (LIPITOR) 40 MG tablet Take 40 mg by mouth daily. 12/10/17  Yes [provider]  cefTRIAXone (ROCEPHIN) IVPB Inject 2 g into the vein daily. Indication:  Prosthetic joint infection Last Day of Therapy:  03/10/2018 Labs - Once weekly:  CBC/D and BMP, Labs - Every other week:  ESR and CRP 01/29/18 03/10/18 Yes Babish, Matthew, PA-C  COSOPT PF 22.3-6.8 MG/ML SOLN ophthalmic solution Place 1 drop into both eyes every morning. 10/22/17  Yes [provider]  docusate sodium (COLACE) 100 MG capsule Take 1 capsule (100 mg total) by mouth 2 (two) times daily. 01/29/18  Yes Babish, Rodman Key, PA-C  Eszopiclone 3 MG TABS Take 3 mg by mouth at bedtime. 02/12/18  Yes [provider]  finasteride (PROSCAR) 5 MG tablet Take 5 mg by mouth daily.   Yes [provider]  flecainide (TAMBOCOR) 50 MG tablet Take 50 mg by mouth 2 (two) times daily.   Yes [provider]  furosemide (LASIX) 20 MG tablet Take 20 mg by  mouth daily.   Yes [provider]  HYDROcodone-acetaminophen (NORCO) 7.5-325 MG tablet Take 1-2 tablets by mouth every 4 (four) hours as needed for moderate pain. 01/29/18  Yes Babish, Rodman Key, PA-C  iron polysaccharides (NIFEREX) 150 MG capsule Take 150 mg by mouth daily.   Yes [provider]  LUMIGAN 0.01 % SOLN Place 1 drop into both eyes at bedtime.  10/22/12  Yes [provider]  LYCOPENE PO Take 1 tablet by mouth daily.   Yes [provider]  MAGNESIUM CARBONATE PO Take 1 tablet by mouth daily.   Yes [provider]  methocarbamol (ROBAXIN) 500 MG tablet Take 1 tablet (500 mg total) by mouth every 6 (six) hours as needed for muscle spasms. 01/29/18  Yes Babish, Rodman Key, PA-C  multivitamin-lutein (OCUVITE-LUTEIN) CAPS capsule Take 1 capsule by mouth daily.   Yes [provider]  NARCAN 4 MG/0.1ML LIQD nasal spray kit Place 1 spray into the nose once.  01/30/18  Yes [provider]  NEXIUM 40 MG capsule Take 40 mg by mouth daily.  11/19/12  Yes [provider]  polyethylene glycol (MIRALAX / GLYCOLAX) packet Take 17 g by mouth 2 (two) times daily. Patient taking differently: Take 17 g by mouth daily as needed for mild constipation.  01/29/18  Yes Babish, Rodman Key, PA-C  tadalafil (CIALIS) 5 MG tablet TAKE ONE TABLET BY MOUTH ONCE DAILY 07/21/17  Yes [provider]  Zinc Acetate, Oral, (ZINC ACETATE PO) Take 1 tablet by mouth daily.   Yes [provider]  vancomycin IVPB Inject 1,000 mg into the vein daily. Indication:  Prosthetic joint infection Last Day of Therapy:  03/10/2018 Labs - Sunday/Monday:  CBC/D, BMP, and vancomycin trough. Labs - Thursday:  BMP and vancomycin trough Labs - Every other week:  ESR and CRP Patient not taking: Reported on 02/18/2018 01/29/18 03/10/18  Danae Orleans, PA-C    Family History Family History  Problem Relation Age of Onset  . Hypertension Mother   . Cancer Mother      Social History Social History   Tobacco Use  . Smoking status: Never Smoker  . Smokeless tobacco: Never Used  Substance Use Topics  . Alcohol use: Not Currently    Comment: socially   . Drug use: No     Allergies   Other and Horse epithelium   Review of Systems Review of Systems  All other systems reviewed and are negative.    Physical Exam Updated Vital Signs BP (!) 103/50   Pulse 95   Temp (!) 102.3 F (39.1 C) (Oral)   Resp (!) 22   SpO2 93%   Physical Exam  Constitutional: He is oriented to person, place, and time. He appears well-developed.  HENT:  Head: Atraumatic.  Eyes: Pupils are equal, round, and reactive to light. EOM are normal.  Neck: Neck supple.  No meningismus  Cardiovascular:  No murmur heard. Tachycardia  Pulmonary/Chest: Effort normal and breath sounds normal.  Abdominal: Soft.  Musculoskeletal:  Patient has significant edema over the right eye.  The edema appears to be subcutaneous, there is no surrounding erythema or warmth to touch.  Patient has tenderness with passive leg raise on the right lower extremity.   Neurological: He is alert and oriented to person, place, and time. He is not disoriented. No cranial nerve deficit.  Skin: Skin is warm.  Nursing note and vitals reviewed.    ED Treatments / Results  Labs (all labs ordered are listed, but only abnormal results are displayed) Labs Reviewed  COMPREHENSIVE METABOLIC PANEL - Abnormal; Notable for the following components:      Result Value   Potassium 3.4 (*)    Glucose, Bld 139 (*)    Calcium 8.5 (*)    Total Protein 6.0 (*)    Albumin 2.7 (*)    All other components within normal limits  CBC WITH DIFFERENTIAL/PLATELET - Abnormal; Notable for the following components:   RBC 3.03 (*)    Hemoglobin 8.9 (*)  HCT 28.0 (*)    Lymphs Abs 0.4 (*)    All other components within normal limits  CULTURE, BLOOD (ROUTINE X 2)  CULTURE, BLOOD (ROUTINE X 2)  PROTIME-INR   URINALYSIS, ROUTINE W REFLEX MICROSCOPIC  C-REACTIVE PROTEIN  I-STAT CG4 LACTIC ACID, ED  I-STAT CG4 LACTIC ACID, ED    EKG None  Radiology Ct Head Wo Contrast  Result Date: 02/20/2018 CLINICAL DATA:  Acute headaches. Normal neurological examination. EXAM: CT HEAD WITHOUT CONTRAST TECHNIQUE: Contiguous axial images were obtained from the base of the skull through the vertex without intravenous contrast. COMPARISON:  CT head 04/22/2017. MRI brain 04/22/2017. FINDINGS: Brain: Small focal area of encephalomalacia in the right posterior parietal lobe. No change since prior study. Consistent with old infarct. Mild diffuse cerebral atrophy. Ventricular dilatation consistent with central atrophy. Patchy low-attenuation changes in the deep white matter consistent small vessel ischemia. No mass effect or midline shift. No acute intracranial hemorrhage. Gray-white matter junctions are distinct. Basal cisterns are not effaced. No abnormal extra-axial fluid collections. Vascular: Moderate intracranial arterial vascular calcifications are present. Skull: Calvarium appears intact. Sinuses/Orbits: Paranasal sinuses and mastoid air cells are clear. Other: None. IMPRESSION: No acute intracranial abnormalities. Chronic atrophy and small vessel ischemic changes. 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 Electronically Signed   By: Lucienne Capers M.D.   On: 02/20/2018 02:33   Ct Femur Right Wo Contrast  Result Date: 02/20/2018 CLINICAL DATA:  Recent revision of hip report and. Possible infection. EXAM: CT OF THE LOWER RIGHT EXTREMITY WITHOUT CONTRAST TECHNIQUE: Multidetector CT imaging of the right lower extremity was performed according to the standard protocol. COMPARISON:  Pelvis 01/26/2018. MRI femur 12/16/2017. FINDINGS: Bones/Joint/Cartilage Right total hip arthroplasty using non cemented long stem component. Artifact from the metal limits evaluation in some areas. No evidence of acute fracture or  dislocation of the femur. No dislocation of the prosthesis. At the lateral aspect of the proximal femoral component as it inserts into the greater trochanter, there is mild separation at the bone-cement interface with tiny gas collections. Depending on the timing of surgery, this could represent postoperative change. If there was no recent surgery, gas and bone loss could indicate prosthetic infection. If clinically indicated, consider FNA for further evaluation. There is an expansile lesion in the right posterior acetabulum and ischium with cortical disruption. Soft tissue changes extend into the adjacent musculature and along the posterior compartment musculature. Fluid or soft tissue changes also demonstrated anterior and medial to the femoral neck with peripheral calcification. Similar changes seen on previous MRI with MRI features reportedly consistent with metalosis and pseudotumor. Ligaments Suboptimally assessed by CT. Muscles and Tendons Complex fluid/soft tissue collections with peripheral calcification in the posterior compartment and medial hip musculature as described above. Collections extend down to the level of the knee. Soft tissues Vascular calcifications. Mild infiltration of the subcutaneous fat laterally, likely edema or cellulitis. No discrete abscess. IMPRESSION: 1. Right total hip arthroplasty using long-stem component. 2. Suggestion of bone separation and soft tissue gas at the greater trochanter. This could represent postoperative change or periprosthetic infection. No abscess. 3. Complex soft tissue/fluid collections demonstrated around the right hip and right femur associated with an expansile bone lesion in the ischium. These changes were previously seen at MRI and suggested metalosis or hematoma. 4. No acute fracture or dislocation. Electronically Signed   By: Lucienne Capers M.D.   On: 02/20/2018 02:44   US Venous Img Lower Unilateral Right  Result Date: 02/18/2018 CLINICAL  DATA:   83 year old male with right lower extremity pain and swelling. 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 EXAM: RIGHT LOWER EXTREMITY VENOUS DOPPLER ULTRASOUND TECHNIQUE: Gray-scale sonography with graded compression, as well as color Doppler and duplex ultrasound were performed to evaluate the lower extremity deep venous systems from the level of the common femoral vein and including the common femoral, femoral, profunda femoral, popliteal and calf veins including the posterior tibial, peroneal and gastrocnemius veins when visible. The superficial great saphenous vein was also interrogated. Spectral Doppler was utilized to evaluate flow at rest and with distal augmentation maneuvers in the common femoral, femoral and popliteal veins. COMPARISON:  MRI pelvis and lower extremities 12/16/2017 FINDINGS: Contralateral Common Femoral Vein: Respiratory phasicity is normal and symmetric with the symptomatic side. No evidence of thrombus. Normal compressibility. Common Femoral Vein: No evidence of thrombus. Normal compressibility, respiratory phasicity and response to augmentation. Saphenofemoral Junction: No evidence of thrombus. Normal compressibility and flow on color Doppler imaging. Profunda Femoral Vein: No evidence of thrombus. Normal compressibility and flow on color Doppler imaging. Femoral Vein: No evidence of thrombus. Normal compressibility, respiratory phasicity and response to augmentation. Popliteal Vein: No evidence of thrombus. Normal compressibility, respiratory phasicity and response to augmentation. Calf Veins: No evidence of thrombus. Normal compressibility and flow on color Doppler imaging. Superficial Great Saphenous Vein: No evidence of thrombus. Normal compressibility. Venous Reflux:  None. Other Findings: Elongated complex fluid collection in the posterolateral aspect of the right upper extremity measuring approximately 25 x 2.6 cm. A second complex  fluid collection is identified in the posterior aspect of the distal thigh measuring 12.5 x 1.8 x 2.7 cm. IMPRESSION: 1. No evidence of deep venous thrombosis. 2. Multiple large complex fluid collections present in the region of the right hip and posterolateral thigh. Correlation with prior MRI imaging demonstrates similar findings. 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 Most recent prior aspiration of 1 of the fluid collections was performed on 12/17/2017. Electronically Signed   By: Jacqulynn Cadet M.D.   On: 02/18/2018 14:36    Procedures Procedures (including critical care time)  Medications Ordered in ED Medications  metroNIDAZOLE (FLAGYL) IVPB 500 mg ( Intravenous Rate/Dose Verify 02/20/18 0312)  vancomycin (VANCOCIN) 1,500 mg in sodium chloride 0.9 % 500 mL IVPB (has no administration in time range)  acetaminophen (TYLENOL) tablet 650 mg (has no administration in time range)    Or  acetaminophen (TYLENOL) suppository 650 mg (has no administration in time range)  ondansetron (ZOFRAN) tablet 4 mg (has no administration in time range)    Or  ondansetron (ZOFRAN) injection 4 mg (has no administration in time range)  0.9 %  sodium chloride infusion (has no administration in time range)  acetaminophen (TYLENOL) tablet 650 mg (650 mg Oral Given 02/20/18 0055)  ceFEPIme (MAXIPIME) 2 g in sodium chloride 0.9 % 100 mL IVPB ( Intravenous Stopped 02/20/18 0227)     Initial Impression / Assessment and Plan / ED Course  I have reviewed the triage vital signs and the nursing notes.  Pertinent labs & imaging results that were available during my care of the patient were reviewed by me and considered in my medical decision making (see chart for details).  Clinical Course as of Feb 20 322  Fri Feb 20, 2018  8242 I have not heard back from the infectious disease team.  Admitting team have been made aware of this. We have given patient IV vancomycin.  He does  not appear to  be the cause for his fevers.   [AN]  [id number] I spoke with orthopedic surgery.  The surgery team had requested that we do a CT of the lower extremity as well, and a noncontrasted study was completed and has equivocal findings.  They will see the patient for further assessment and recommendation from their part.   [AN]    Clinical Course User Index [AN] Varney Biles, MD    83 year old male comes in with chief complaint of fever. In addition to fever patient also complains of a headache, cough.  Patient has had a complicated course over the past month, as he has been status post washout of his right hip and placed on IV ceftriaxone and vancomycin.  It appears that the fevers developed 4 days ago.  Today patient had Reiger's which got him concerned.  Headaches have been present off and on, there is no meningismus, no focal neurologic deficits or symptoms, patient is nontoxic -and we do not think he has meningitis or encephalitis.  Patient has a cough, it seems like CT PE has been ordered and the ultrasound DVT that was completed was negative.  I doubt that the PE is the cause for such high-grade fever.  Chest x-ray will be ordered.  The only area of concern on clinical is the right lower extremity.  There is tenderness with passive leg raise, patient reports his pain is worsened and he has new swelling.  His surgery was now close to a month ago, therefore it is highly unlikely that he has developed a new complication from the washout.  Additionally, he never grew any microbes after his previous washout and has been on broad-spectrum antibiotics for long-term.  There could be an infected hematoma.  I still think orthopedic service will have to see him to ensure that there is no concern for new hip infection or prosthetic related issue.  Possibilities include bacteremia.  Orthopedic service and ID have been paged to.   Final Clinical Impressions(s) / ED Diagnoses   Final diagnoses:  None     ED Discharge Orders    None       Varney Biles, MD 02/20/18 [id number]

## 2018-02-20 NOTE — Progress Notes (Signed)
[facility]  [facility] is providing [facility] services at home. [facility] hospital team will follow during this admission and support DC home.  If patient discharges after hours, please call [phone].   Sedalia Muta 02/20/2018, 11:33 AM

## 2018-02-20 NOTE — Progress Notes (Signed)
Pharmacy Antibiotic Note  Travis Wolfe is a 83 y.o. male admitted on 02/20/2018 with sepsis.  Pharmacy has been consulted for cefepime and vancomycin dosing.  Plan: Cefepime 2 Gm IV q8h Vancomycin 1500 mg x1 then 1 Gm IV q24h for est AUC = 493 Flagyl 500 mg IV q8h (MD) Goal AUC = 400-500 F/u scr/cultures/levels    Temp (24hrs), Avg:101.7 F (38.7 C), Min:98.2 F (36.8 C), Max:103.2 F (39.6 C)  Recent Labs  Lab 02/18/18 1653 02/20/18 0058 02/20/18 0107  WBC 6.7 8.3  --   CREATININE  --  0.69  --   LATICACIDVEN  --   --  1.51    Estimated Creatinine Clearance: 63.3 mL/min (by C-G formula based on SCr of 0.69 mg/dL).    Allergies  Allergen Reactions  . Other     Cat dander  . Horse Epithelium Rash    Antimicrobials this admission: 9/6 cefepime >>  9/6 flagyl >>  9/6 vancomycin >>  Dose adjustments this admission:   Microbiology results:  BCx:   UCx:   Sputum:    MRSA PCR:   Thank you for allowing pharmacy to be a part of this patient's care.  [name] 02/20/2018 4:34 AM

## 2018-02-21 ENCOUNTER — Inpatient Hospital Stay (HOSPITAL_COMMUNITY): Payer: Medicare Other

## 2018-02-21 DIAGNOSIS — R5081 Fever presenting with conditions classified elsewhere: Secondary | ICD-10-CM

## 2018-02-21 DIAGNOSIS — I480 Paroxysmal atrial fibrillation: Secondary | ICD-10-CM

## 2018-02-21 DIAGNOSIS — T8451XD Infection and inflammatory reaction due to internal right hip prosthesis, subsequent encounter: Secondary | ICD-10-CM

## 2018-02-21 LAB — BASIC METABOLIC PANEL
ANION GAP: 8 (ref 5–15)
BUN: 12 mg/dL (ref 8–23)
CHLORIDE: 109 mmol/L (ref 98–111)
CO2: 24 mmol/L (ref 22–32)
Calcium: 8.3 mg/dL — ABNORMAL LOW (ref 8.9–10.3)
Creatinine, Ser: 0.74 mg/dL (ref 0.61–1.24)
GFR calc Af Amer: >60 mL/min (ref >60)
Glucose, Bld: 155 mg/dL — ABNORMAL HIGH (ref 70–99)
Potassium: 3.4 mmol/L — ABNORMAL LOW (ref 3.5–5.1)
Sodium: 141 mmol/L (ref 135–145)

## 2018-02-21 LAB — CBC WITH DIFFERENTIAL/PLATELET
BASOS ABS: 0 10*3/uL (ref 0.0–0.1)
Basophils Relative: 0 %
EOS ABS: 0.6 10*3/uL (ref 0.0–0.7)
Eosinophils Relative: 8 %
HCT: 26 % — ABNORMAL LOW (ref 39.0–52.0)
HEMOGLOBIN: 8.4 g/dL — AB (ref 13.0–17.0)
LYMPHS PCT: 7 %
Lymphs Abs: 0.6 10*3/uL — ABNORMAL LOW (ref 0.7–4.0)
MCH: 29.8 pg (ref 26.0–34.0)
MCHC: 32.3 g/dL (ref 30.0–36.0)
MCV: 92.2 fL (ref 78.0–100.0)
Monocytes Absolute: 0.4 10*3/uL (ref 0.1–1.0)
Monocytes Relative: 6 %
NEUTROS PCT: 79 %
Neutro Abs: 6.2 10*3/uL (ref 1.7–7.7)
PLATELETS: 272 10*3/uL (ref 150–400)
RBC: 2.82 MIL/uL — ABNORMAL LOW (ref 4.22–5.81)
RDW: 14.9 % (ref 11.5–15.5)
WBC: 7.8 10*3/uL (ref 4.0–10.5)

## 2018-02-21 MED ORDER — PROMETHAZINE HCL 25 MG/ML IJ SOLN
12.5000 mg | Freq: Four times a day (QID) | INTRAMUSCULAR | Status: DC | PRN
  Administered 2018-02-21: 12.5 mg via INTRAVENOUS
  Filled 2018-02-21: qty 1

## 2018-02-21 MED ORDER — SODIUM CHLORIDE 0.9% FLUSH: 10.0000 mL | INTRAVENOUS | Status: DC | PRN

## 2018-02-21 MED ORDER — IOHEXOL 300 MG/ML  SOLN
75.0000 mL | Freq: Once | INTRAMUSCULAR | Status: AC | PRN
  Administered 2018-02-21: 75 mL via INTRAVENOUS

## 2018-02-21 NOTE — Progress Notes (Signed)
Delay in getting chest CT per patient's request. Pt states he has to ask his cardiologist before having any contrast injected. Pt has called outpt office and is waiting on on-call MD to return his phone call.

## 2018-02-21 NOTE — Progress Notes (Signed)
Patient ID: Travis Wolfe, male   DOB: 11/15/1934, 83 y.o.   MRN: [id number]  Awake alert this am Comfortable  No fever or chills past 24 hrs Restarted on VANCO yesterday  Right thigh looks as good as I have seen it since we first met a month or so ago Distal part of incision healed with slight protuberance but no active drainage  Discussed a plan of observing for now, see how he reacts to the re-starting of Vanc Discussed possible returning to OR to inspect wound   Fever and chills still unknown source as treatment for hip on going with regards to previous I&D and IV antibiotics - questioned drug reaction versus hip involvement though cultures negative at last procedure  Will follow No OR planned for today

## 2018-02-21 NOTE — Progress Notes (Addendum)
PROGRESS NOTE    Travis Wolfe  JYN:[id number] DOB: June 01, 1935 DOA: 02/20/2018 PCP: Elijio Miles., MD   Brief Narrative:82 y.o.malewith medical history significant ofPAF on eliquis, HTN. Pt presents to the ED c/o fever and chills for the past day, increased hip pain and some difficulty with ambulation. Of note,patient has had numerous complications following a R THA some years ago, followed by multiple other surgeries, wash outs, revisions. Pt was most recently admitted with suspected PJI last month, underwent washout, placed on Vanc and ceftriaxone for 6 weeks with fevers still persisting. Pt saw Dr Drue Second from ID, was thought fevers were due to Vancomycin and discontinued it. Multiple cultures have been so far negative. In the ED, pt had a fever of 103. CT R hip showed suggestion of bone separation and soft tissue gas at the greater trochanter. This could represent postoperative change or periprosthetic infection. No abscess. Pt admitted for further management.    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  He spiked fever in spite of being on IV antibiotics.  Today patient has been wheezing and is complaining of cough will order a chest CT to rule out any infectious source. Currently afebrile with no leukocytosis BC X 2 pending LA 1.51, CRP 9.3 CT R femur showed: Right total hip arthroplasty using long-stem component. Suggestion of bone separation and soft tissue gas at the greater trochanter. This could represent postoperative change or periprosthetic infection. No abscess Ortho on board: appreciate recs, considering another I&D Consider ID consult if fever re-occurs despite on IV AB or if clinical  deterioration Continue IV Cefepime, Vancomycin Continue IVF Restarted VANCOmycin 9/6 observe for any reactions May have to go back to the OR to inspect the wound.   PAF Rate controlled Continue Eliquis  HTN BP soft Not on any meds  HLD Continue statins  Anemia with drop in hemoglobin since admit.monitor closely.check fobt.       DVT prophylaxis:eliquis Code Status: full Family Communication:none Disposition Plan:tbd  Consultants: ortho  Procedures:none Antimicrobials:  Cefepime azithro Subjective: Is very concerned about the fever chills and rigors.  Complaining of a cough and wheezing.  Right hip swelling is decreased.  Objective: Vitals:   02/20/18 0424 02/20/18 0454 02/20/18 1435 02/20/18 2117  BP:  (!) 103/58 (!) 106/55 (!) 110/56  Pulse:  (!) 111 68 81  Resp:  16 16 18   Temp: 98.2 F (36.8 C) 98 F (36.7 C) 97.6 F (36.4 C) 99.1 F (37.3 C)  TempSrc: Oral Oral Oral Oral  SpO2:  97% 100% 98%  Weight:  65.4 kg    Height:  5\' 8"  (1.727 m)      Intake/Output Summary (Last 24 hours) at 02/21/2018 1100 Last data filed at 02/21/2018 0900 Gross per 24 hour  Intake 980 ml  Output 600 ml  Net 380 ml   Filed Weights   02/20/18 0454  Weight: 65.4 kg    Examination:  General exam: Appears calm and comfortable  Respiratory system: FEW scattered wheezes  to auscultation. Respiratory effort normal. Cardiovascular system: S1 & S2 heard, RRR. No JVD, murmurs, rubs, gallops or clicks. No pedal edema. Gastrointestinal system: Abdomen is nondistended, soft and nontender. No organomegaly or masses felt. Normal  bowel sounds heard. Central nervous system: Alert and oriented. No focal neurological deficits. Extremities: right hip no drainage  Skin: No rashes, lesions or ulcers Psychiatry: Judgement and insight appear normal. Mood & affect appropriate.     Data Reviewed: I have personally reviewed following labs and imaging studies  CBC: Recent Labs    Lab March 16, 2018 1653 02/20/18 0058 02/21/18 0824  WBC 6.7 8.3 7.8  NEUTROABS 4,663 6.9 6.2  HGB 10.1* 8.9* 8.4*  HCT 30.5* 28.0* 26.0*  MCV 89.7 92.4 92.2  PLT 291 291 272   Basic Metabolic Panel: Recent Labs  Lab 02/20/18 0058 02/21/18 0824  NA 141 141  K 3.4* 3.4*  CL 105 109  CO2 25 24  GLUCOSE 139* 155*  BUN 13 12  CREATININE 0.69 0.74  CALCIUM 8.5* 8.3*   GFR: Estimated Creatinine Clearance: 68.1 mL/min (by C-G formula based on SCr of 0.74 mg/dL). Liver Function Tests: Recent Labs  Lab 02/20/18 0058  AST 28  ALT 24  ALKPHOS 74  BILITOT 0.5  PROT 6.0*  ALBUMIN 2.7*   No results for input(s): LIPASE, AMYLASE in the last 168 hours. No results for input(s): AMMONIA in the last 168 hours. Coagulation Profile: Recent Labs  Lab 02/20/18 0058  INR 1.03   Cardiac Enzymes: No results for input(s): CKTOTAL, CKMB, CKMBINDEX, TROPONINI in the last 168 hours. BNP (last 3 results) No results for input(s): PROBNP in the last 8760 hours. HbA1C: No results for input(s): HGBA1C in the last 72 hours. CBG: No results for input(s): GLUCAP in the last 168 hours. Lipid Profile: No results for input(s): CHOL, HDL, LDLCALC, TRIG, CHOLHDL, LDLDIRECT in the last 72 hours. Thyroid Function Tests: No results for input(s): TSH, T4TOTAL, FREET4, T3FREE, THYROIDAB in the last 72 hours. Anemia Panel: No results for input(s): VITAMINB12, FOLATE, FERRITIN, TIBC, IRON, RETICCTPCT in the last 72 hours. Sepsis Labs: Recent Labs  Lab 02/20/18 0107  LATICACIDVEN 1.51    Recent Results (from the past 240 hour(s))  Blood culture (routine single)     Status: None (Preliminary result)   Collection Time: 03/16/2018  5:09 PM  Result Value Ref Range Status   MICRO NUMBER: [id number]  Preliminary   SPECIMEN QUALITY: ADEQUATE  Preliminary   Source BLOOD, LEFT ARM  Preliminary   STATUS: PRELIMINARY  Preliminary   Result:   Preliminary    No growth to date. Culture is continuously monitored  for a total of 120 hours incubation. A change in status will result in a phone report followed by an updated printed culture report.   COMMENT: Aerobic and anaerobic bottle received.  Preliminary  Blood culture (routine single)     Status: None (Preliminary result)   Collection Time: 03-16-18  5:12 PM  Result Value Ref Range Status   MICRO NUMBER: [id number]  Preliminary   SPECIMEN QUALITY: SUBOPTIMAL  Preliminary   Source OTHER (SPECIFY)  Preliminary   STATUS: PRELIMINARY  Preliminary   Result:   Preliminary    No growth to date. Culture is continuously monitored for a total of 120 hours incubation. A change in status will result in a phone report followed by an updated printed culture report. Visual inspection of blood culture bottles indicates that an  inadequate volume of blood may have been collected for the detection of sepsis.    COMMENT: Aerobic and anaerobic bottle received.  Preliminary  Culture, blood (Routine x 2)     Status: None (Preliminary result)   Collection Time: 02/20/18 12:50 AM  Result Value Ref Range Status   Specimen Description   Final    BLOOD RIGHT ARM PICC LINE Performed at [facility], [address], [place], [place] [address]    Special Requests   Final    BOTTLES DRAWN AEROBIC AND ANAEROBIC Blood Culture adequate volume Performed at [facility], [address] [place], [place] [address]    Culture   Final    NO GROWTH 1 DAY Performed at [facility], [address] [place], [place] [address]    Report Status PENDING  Incomplete  Culture, blood (Routine x 2)     Status: None (Preliminary result)   Collection Time: 02/20/18  1:19 AM  Result Value Ref Range Status   Specimen Description   Final    BLOOD LEFT ANTECUBITAL Performed at [facility], [address], [place], [place] [address]    Special Requests   Final    BOTTLES DRAWN AEROBIC AND ANAEROBIC Blood Culture adequate volume Performed at  [facility], [address] [place], [place] [address]    Culture   Final    NO GROWTH 1 DAY Performed at [facility], [address], [place], [place] [address]    Report Status PENDING  Incomplete         Radiology Studies: Ct Head Wo Contrast  Result Date: 02/20/2018 CLINICAL DATA:  Acute headaches. Normal neurological examination. EXAM: CT HEAD WITHOUT CONTRAST TECHNIQUE: Contiguous axial images were obtained from the base of the skull through the vertex without intravenous contrast. COMPARISON:  CT head 04/22/2017. MRI brain 04/22/2017. FINDINGS: Brain: Small focal area of encephalomalacia in the right posterior parietal lobe. No change since prior study. Consistent with old infarct. Mild diffuse cerebral atrophy. Ventricular dilatation consistent with central atrophy. Patchy low-attenuation changes in the deep white matter consistent small vessel ischemia. No mass effect or midline shift. No acute intracranial hemorrhage. Gray-white matter junctions are distinct. Basal cisterns are not effaced. No abnormal extra-axial fluid collections. Vascular: Moderate intracranial arterial vascular calcifications are present. Skull: Calvarium appears intact. Sinuses/Orbits: Paranasal sinuses and mastoid air cells are clear. Other: None. IMPRESSION: No acute intracranial abnormalities. Chronic atrophy and small vessel ischemic changes. 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 Electronically Signed   By: Burman Nieves M.D.   On: 02/20/2018 02:33   Ct Femur Right Wo Contrast  Result Date: 02/20/2018 CLINICAL DATA:  Recent revision of hip report and. Possible infection. EXAM: CT OF THE LOWER RIGHT EXTREMITY WITHOUT CONTRAST TECHNIQUE: Multidetector CT imaging of the right lower extremity was performed according to the standard protocol. COMPARISON:  Pelvis 01/26/2018. MRI femur 12/16/2017. FINDINGS: Bones/Joint/Cartilage Right total hip arthroplasty using non cemented  long stem component. Artifact from the metal limits evaluation in some areas. No evidence of acute fracture or dislocation of the femur. No dislocation of the prosthesis. At the lateral aspect of the proximal femoral component as it inserts into the greater trochanter, there is mild separation at the bone-cement interface with tiny gas collections. Depending on the timing of surgery, this could represent postoperative change. If there was no recent surgery, gas and bone loss could indicate prosthetic infection. If clinically indicated, consider FNA for further evaluation. There is an expansile lesion in the right posterior acetabulum and ischium with cortical disruption. Soft tissue changes extend into the adjacent musculature and along the posterior compartment musculature. Fluid or soft tissue changes also demonstrated anterior and medial to the  femoral neck with peripheral calcification. Similar changes seen on previous MRI with MRI features reportedly consistent with metalosis and pseudotumor. Ligaments Suboptimally assessed by CT. Muscles and Tendons Complex fluid/soft tissue collections with peripheral calcification in the posterior compartment and medial hip musculature as described above. Collections extend down to the level of the knee. Soft tissues Vascular calcifications. Mild infiltration of the subcutaneous fat laterally, likely edema or cellulitis. No discrete abscess. IMPRESSION: 1. Right total hip arthroplasty using long-stem component. 2. Suggestion of bone separation and soft tissue gas at the greater trochanter. This could represent postoperative change or periprosthetic infection. No abscess. 3. Complex soft tissue/fluid collections demonstrated around the right hip and right femur associated with an expansile bone lesion in the ischium. These changes were previously seen at MRI and suggested metalosis or hematoma. 4. No acute fracture or dislocation. Electronically Signed   By: Burman Nieves  M.D.   On: 02/20/2018 02:44        Scheduled Meds: . apixaban  5 mg Oral BID  . aspirin  81 mg Oral Daily  . atorvastatin  40 mg Oral q1800  . docusate sodium  100 mg Oral BID  . dorzolamide  1 drop Both Eyes Daily   And  . timolol  1 drop Both Eyes Daily  . finasteride  5 mg Oral Daily  . flecainide  50 mg Oral BID  . iron polysaccharides  150 mg Oral Daily  . latanoprost  1 drop Both Eyes QHS  . multivitamin  1 tablet Oral Daily  . pantoprazole  80 mg Oral Q1200   Continuous Infusions: . sodium chloride 75 mL/hr at 02/20/18 1800  . ceFEPime (MAXIPIME) IV 2 g (02/21/18 0200)  . metronidazole 500 mg (02/21/18 0042)  . vancomycin 1,000 mg (02/21/18 0755)     LOS: 1 day    Alwyn Ren, MD [facility]  If 7PM-7AM, please contact night-coverage [email or web address] Password [email or web address] 02/21/2018, 11:00 AM

## 2018-02-22 DIAGNOSIS — R52 Pain, unspecified: Secondary | ICD-10-CM

## 2018-02-22 LAB — BASIC METABOLIC PANEL
Anion gap: 5 (ref 5–15)
BUN: 12 mg/dL (ref 8–23)
CO2: 25 mmol/L (ref 22–32)
CREATININE: 0.75 mg/dL (ref 0.61–1.24)
Calcium: 8 mg/dL — ABNORMAL LOW (ref 8.9–10.3)
Chloride: 113 mmol/L — ABNORMAL HIGH (ref 98–111)
GFR calc Af Amer: >60 mL/min (ref >60)
GLUCOSE: 100 mg/dL — AB (ref 70–99)
Potassium: 3.3 mmol/L — ABNORMAL LOW (ref 3.5–5.1)
SODIUM: 143 mmol/L (ref 135–145)

## 2018-02-22 LAB — CBC WITH DIFFERENTIAL/PLATELET
Basophils Absolute: 0 10*3/uL (ref 0.0–0.1)
Basophils Relative: 0 %
EOS ABS: 0.8 10*3/uL — AB (ref 0.0–0.7)
EOS PCT: 10 %
HCT: 25.4 % — ABNORMAL LOW (ref 39.0–52.0)
Hemoglobin: 8.3 g/dL — ABNORMAL LOW (ref 13.0–17.0)
LYMPHS ABS: 0.7 10*3/uL (ref 0.7–4.0)
LYMPHS PCT: 9 %
MCH: 30.1 pg (ref 26.0–34.0)
MCHC: 32.7 g/dL (ref 30.0–36.0)
MCV: 92 fL (ref 78.0–100.0)
MONO ABS: 0.7 10*3/uL (ref 0.1–1.0)
MONOS PCT: 9 %
Neutro Abs: 5.7 10*3/uL (ref 1.7–7.7)
Neutrophils Relative %: 72 %
PLATELETS: 290 10*3/uL (ref 150–400)
RBC: 2.76 MIL/uL — ABNORMAL LOW (ref 4.22–5.81)
RDW: 15 % (ref 11.5–15.5)
WBC: 8 10*3/uL (ref 4.0–10.5)

## 2018-02-22 LAB — OCCULT BLOOD X 1 CARD TO LAB, STOOL: Fecal Occult Bld: NEGATIVE

## 2018-02-22 MED ORDER — POTASSIUM CHLORIDE CRYS ER 20 MEQ PO TBCR
40.0000 meq | EXTENDED_RELEASE_TABLET | ORAL | Status: AC
  Administered 2018-02-22 (×2): 40 meq via ORAL
  Filled 2018-02-22 (×2): qty 2

## 2018-02-22 MED ORDER — CEFTRIAXONE IV (FOR PTA / DISCHARGE USE ONLY): 2.0000 g | INTRAVENOUS | 0 refills | Status: AC

## 2018-02-22 MED ORDER — VANCOMYCIN IV (FOR PTA / DISCHARGE USE ONLY): 1000.0000 mg | INTRAVENOUS | 0 refills | Status: AC

## 2018-02-22 MED ORDER — HEPARIN SOD (PORK) LOCK FLUSH 100 UNIT/ML IV SOLN
250.0000 [IU] | INTRAVENOUS | Status: AC | PRN
  Administered 2018-02-22: 250 [IU]

## 2018-02-22 NOTE — Progress Notes (Signed)
Patient discharged home with wife. HH confirmed, IV ABT reordered. RUE PICC intact and Hep locked by IVT. All belongings sent with pt. Discharge paperwork explained.

## 2018-02-22 NOTE — Progress Notes (Signed)
PHARMACY CONSULT NOTE FOR:  OUTPATIENT  PARENTERAL ANTIBIOTIC THERAPY (OPAT)  Indication: PJI Regimen: Rocephin 2 gm IV q24 and vancomycin 1 gm IV q24 End date: 03/10/18  IV antibiotic discharge orders are pended. To discharging provider:  please sign these orders via discharge navigator,  Select New Orders & click on the button choice - Manage This Unsigned Work.     Thank you for allowing pharmacy to be a part of this patient's care. Herby Abraham, Pharm.D [phone] 02/22/2018 1:59 PM

## 2018-02-22 NOTE — Discharge Summary (Addendum)
Physician Discharge Summary  KIMI KROFT XAJ:[id number] DOB: 1934-07-03 DOA: 02/20/2018  PCP: Derrill Center., MD  Admit date: 02/20/2018 Discharge date: 02/22/2018  Admitted From: HOME Disposition: HOME  Recommendations for Outpatient Follow-up:  1. Follow up with PCP in 1-2 weeks 2. Please obtain BMP/CBC in one week 3. Follow-up with Dr. Alvan Dame tomorrow at 10 AM 02/23/2018  Home Health NONE Equipment/Devices:NONE  Discharge Condition STABLE CODE STATUS FULL Diet recommendation: REGULAR Brief/Interim Summary:83 y.o.malewith medical history significant ofPAF on eliquis, HTN.Pt presents to the ED c/o fever and chills for the past day, increased hip pain and some difficulty with ambulation. Of note,patient has had numerous complications following a R THA some years ago, followed by multiple other surgeries, wash outs, revisions.Ptwas most recently admitted with suspected PJI last month, underwent washout, placed on Vanc and ceftriaxone for 6 weeks with feversstill persisting. Pt saw Dr Baxter Flattery from ID, was thought fevers were due to Vancomycin and discontinued it. Multiple cultures have been so far negative. In the ED, pt had a fever of 103. CT R hip showed suggestion of bone separation and soft tissue gas at the greater trochanter. This could represent postoperative change or periprosthetic infection. No abscess.Pt admitted for further management. Discharge Diagnoses:  Principal Problem:   Infection of right prosthetic hip joint ([place]) Active Problems:   PAF (paroxysmal atrial fibrillation) (HCC)   Metallosis   Fever  Sepsis likely 2/2 infected R hip arthroplasty Febrile, tachycardic on presentation, patient had been on vancomycin and cefepime at home since 12 August.  He spiked fever in spite of being on IV antibiotics.  CT scan of the chest done to rule out any other source of infection shows--new tiny bilateral pleural effusion new pulmonary internodular septal thickening in the  lung base suspicious for mild interstitial edema , asymmetric airspace disease in the posterior right upper lobe suspicious for pneumonia.  He is on room air denies any shortness of breath or dyspnea on exertion he is ambulating in the hallway without any complaints.afebrile with no leukocytosis BC X 2 negative LA 1.51, CRP 9.3 CT R femur showed:Right total hip arthroplasty using long-stem component. Suggestion of bone separation and soft tissue gas at the greater trochanter. This could represent postoperative change or periprosthetic infection. No abscess  Patient was seen by Ortho Dr. Alvan Dame patient has follow-up appointment with Dr. Alvan Dame 02/23/2018. He will be discharged on vancomycin and Rocephin to finish the course.  He will follow-up with Dr. Graylon Good prior to finishing the antibiotics. PAF Rate controlled Continue Eliquis  HTN BP soft LASIX NOT RESTARTED  HLD Continue statins  CHRONIC ANEMIA MONITOR AS AN OUT PATIENT  Discharge Instructions  Discharge Instructions    Call MD for:  difficulty breathing, headache or visual disturbances   Complete by:  As directed    Call MD for:  persistant dizziness or light-headedness   Complete by:  As directed    Call MD for:  persistant nausea and vomiting   Complete by:  As directed    Call MD for:  severe uncontrolled pain   Complete by:  As directed    Call MD for:  temperature >100.4   Complete by:  As directed    Diet - low sodium heart healthy   Complete by:  As directed    Increase activity slowly   Complete by:  As directed      Allergies as of 02/22/2018      Reactions   Other    Cat  dander   Horse Epithelium Rash      Medication List    STOP taking these medications   docusate sodium 100 MG capsule Commonly known as:  COLACE   LASIX 20 MG tablet Generic drug:  furosemide   MAGNESIUM CARBONATE PO   tadalafil 5 MG tablet Commonly known as:  CIALIS   ZINC ACETATE PO     TAKE these medications   aspirin 81  MG chewable tablet Chew 1 tablet (81 mg total) by mouth 2 (two) times daily. Take for 4 weeks, then resume regular dose.   atorvastatin 40 MG tablet Commonly known as:  LIPITOR Take 40 mg by mouth daily.   cefTRIAXone  IVPB Commonly known as:  ROCEPHIN Inject 2 g into the vein daily. Indication:  Prosthetic joint infection Last Day of Therapy:  03/10/2018 Labs - Once weekly:  CBC/D and BMP, Labs - Every other week:  ESR and CRP   COSOPT PF 22.3-6.8 MG/ML Soln ophthalmic solution Generic drug:  dorzolamidel-timolol Place 1 drop into both eyes every morning.   ELIQUIS 5 MG Tabs tablet Generic drug:  apixaban Take 5 mg by mouth 2 (two) times daily.   Eszopiclone 3 MG Tabs Take 3 mg by mouth at bedtime.   finasteride 5 MG tablet Commonly known as:  PROSCAR Take 5 mg by mouth daily.   flecainide 50 MG tablet Commonly known as:  TAMBOCOR Take 50 mg by mouth 2 (two) times daily.   HYDROcodone-acetaminophen 7.5-325 MG tablet Commonly known as:  NORCO Take 1-2 tablets by mouth every 4 (four) hours as needed for moderate pain.   iron polysaccharides 150 MG capsule Commonly known as:  NIFEREX Take 150 mg by mouth daily.   LUMIGAN 0.01 % Soln Generic drug:  bimatoprost Place 1 drop into both eyes at bedtime.   LYCOPENE PO Take 1 tablet by mouth daily.   methocarbamol 500 MG tablet Commonly known as:  ROBAXIN Take 1 tablet (500 mg total) by mouth every 6 (six) hours as needed for muscle spasms.   multivitamin-lutein Caps capsule Take 1 capsule by mouth daily.   NARCAN 4 MG/0.1ML Liqd nasal spray kit Generic drug:  naloxone Place 1 spray into the nose once.   NEXIUM 40 MG capsule Generic drug:  esomeprazole Take 40 mg by mouth daily.   polyethylene glycol packet Commonly known as:  MIRALAX / GLYCOLAX Take 17 g by mouth 2 (two) times daily. What changed:    when to take this  reasons to take this   vancomycin  IVPB Inject 1,000 mg into the vein daily.  Indication:  Prosthetic joint infection Last Day of Therapy:  03/10/2018 Labs - Sunday/Monday:  CBC/D, BMP, and vancomycin trough. Labs - Thursday:  BMP and vancomycin trough Labs - Every other week:  ESR and CRP      Follow-up Information    Derrill Center., MD Follow up.   Specialty:  Family Medicine Contact information: [address] [place] [address] [phone]        Carlyle Basques, MD Follow up.   Specialty:  Infectious Diseases Contact information: [address] [place] [address] [phone]          Allergies  Allergen Reactions  . Other     Cat dander  . Horse Epithelium Rash    Consultations: ORTHO  Procedures/Studies: Ct Head Wo Contrast  Result Date: 02/20/2018 CLINICAL DATA:  Acute headaches. Normal neurological examination. EXAM: CT HEAD WITHOUT CONTRAST TECHNIQUE:  Contiguous axial images were obtained from the base of the skull through the vertex without intravenous contrast. COMPARISON:  CT head 04/22/2017. MRI brain 04/22/2017. FINDINGS: Brain: Small focal area of encephalomalacia in the right posterior parietal lobe. No change since prior study. Consistent with old infarct. Mild diffuse cerebral atrophy. Ventricular dilatation consistent with central atrophy. Patchy low-attenuation changes in the deep white matter consistent small vessel ischemia. No mass effect or midline shift. No acute intracranial hemorrhage. Gray-white matter junctions are distinct. Basal cisterns are not effaced. No abnormal extra-axial fluid collections. Vascular: Moderate intracranial arterial vascular calcifications are present. Skull: Calvarium appears intact. Sinuses/Orbits: Paranasal sinuses and mastoid air cells are clear. Other: None. IMPRESSION: No acute intracranial abnormalities. Chronic atrophy and small vessel ischemic changes. 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 Electronically Signed   By: Lucienne Capers M.D.   On:  02/20/2018 02:33   Ct Chest W Contrast  Result Date: 02/21/2018 CLINICAL DATA:  Fever of unknown origin for several weeks. EXAM: CT CHEST WITH CONTRAST TECHNIQUE: Multidetector CT imaging of the chest was performed during intravenous contrast administration. CONTRAST:  24m OMNIPAQUE IOHEXOL 300 MG/ML  SOLN COMPARISON:  10/14/2017 and 05/10/2015 FINDINGS: Cardiovascular: No acute findings. Aortic and coronary artery atherosclerosis. Stable mild cardiomegaly. Mediastinum/Nodes: No masses or pathologically enlarged lymph nodes identified. Lungs/Pleura: New tiny bilateral pleural effusions. Thickening of interlobular septi noted in the lung bases, suspicious for mild interstitial edema. Mild asymmetric airspace disease is seen in the posterior right upper lobe, suspicious for pneumonia. Two sub-cm pulmonary nodules in the left lower lobe remains stable since 2016 exam, consistent with benign etiology. Upper Abdomen: Tiny calcified gallstones again noted, without evidence of cholecystitis or biliary ductal dilatation. Musculoskeletal:  No suspicious bone lesions. IMPRESSION: New tiny bilateral pleural effusions. New pulmonary interlobular septal thickening in the lung bases is suspicious for mild interstitial edema. Asymmetric airspace disease in posterior right upper lobe, suspicious for pneumonia. Recommend continued chest x-ray or CT follow-up to confirm resolution. Aortic Atherosclerosis (ICD10-I70.0). Coronary artery calcification. Cholelithiasis incidentally noted. Electronically Signed   By: JEarle GellM.D.   On: 02/21/2018 18:18   Dg Pelvis Portable  Result Date: 01/26/2018 CLINICAL DATA:  Status post right hip replacement EXAM: PORTABLE PELVIS 1-2 VIEWS COMPARISON:  12/09/2017 FINDINGS: Right hip prosthesis is noted in satisfactory position. Chronic changes along the medial aspect of the ischium are seen portion may be related to the recent debridement. Postsurgical changes in the lumbosacral spine are  noted. The proximal femur is intact. Surgical drains are noted. IMPRESSION: Status post hip revision and debridement. Electronically Signed   By: MInez CatalinaM.D.   On: 01/26/2018 19:20   Ct Femur Right Wo Contrast  Result Date: 02/20/2018 CLINICAL DATA:  Recent revision of hip report and. Possible infection. EXAM: CT OF THE LOWER RIGHT EXTREMITY WITHOUT CONTRAST TECHNIQUE: Multidetector CT imaging of the right lower extremity was performed according to the standard protocol. COMPARISON:  Pelvis 01/26/2018. MRI femur 12/16/2017. FINDINGS: Bones/Joint/Cartilage Right total hip arthroplasty using non cemented long stem component. Artifact from the metal limits evaluation in some areas. No evidence of acute fracture or dislocation of the femur. No dislocation of the prosthesis. At the lateral aspect of the proximal femoral component as it inserts into the greater trochanter, there is mild separation at the bone-cement interface with tiny gas collections. Depending on the timing of surgery, this could represent postoperative change. If there was no recent surgery, gas and bone loss could indicate prosthetic infection.  If clinically indicated, consider FNA for further evaluation. There is an expansile lesion in the right posterior acetabulum and ischium with cortical disruption. Soft tissue changes extend into the adjacent musculature and along the posterior compartment musculature. Fluid or soft tissue changes also demonstrated anterior and medial to the femoral neck with peripheral calcification. Similar changes seen on previous MRI with MRI features reportedly consistent with metalosis and pseudotumor. Ligaments Suboptimally assessed by CT. Muscles and Tendons Complex fluid/soft tissue collections with peripheral calcification in the posterior compartment and medial hip musculature as described above. Collections extend down to the level of the knee. Soft tissues Vascular calcifications. Mild infiltration of the  subcutaneous fat laterally, likely edema or cellulitis. No discrete abscess. IMPRESSION: 1. Right total hip arthroplasty using long-stem component. 2. Suggestion of bone separation and soft tissue gas at the greater trochanter. This could represent postoperative change or periprosthetic infection. No abscess. 3. Complex soft tissue/fluid collections demonstrated around the right hip and right femur associated with an expansile bone lesion in the ischium. These changes were previously seen at MRI and suggested metalosis or hematoma. 4. No acute fracture or dislocation. Electronically Signed   By: Lucienne Capers M.D.   On: 02/20/2018 02:44   US Venous Img Lower Unilateral Right  Result Date: 02/18/2018 CLINICAL DATA:  83 year old male with right lower extremity pain and swelling. 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 EXAM: RIGHT LOWER EXTREMITY VENOUS DOPPLER ULTRASOUND TECHNIQUE: Gray-scale sonography with graded compression, as well as color Doppler and duplex ultrasound were performed to evaluate the lower extremity deep venous systems from the level of the common femoral vein and including the common femoral, femoral, profunda femoral, popliteal and calf veins including the posterior tibial, peroneal and gastrocnemius veins when visible. The superficial great saphenous vein was also interrogated. Spectral Doppler was utilized to evaluate flow at rest and with distal augmentation maneuvers in the common femoral, femoral and popliteal veins. COMPARISON:  MRI pelvis and lower extremities 12/16/2017 FINDINGS: Contralateral Common Femoral Vein: Respiratory phasicity is normal and symmetric with the symptomatic side. No evidence of thrombus. Normal compressibility. Common Femoral Vein: No evidence of thrombus. Normal compressibility, respiratory phasicity and response to augmentation. Saphenofemoral Junction: No evidence of thrombus. Normal compressibility and flow  on color Doppler imaging. Profunda Femoral Vein: No evidence of thrombus. Normal compressibility and flow on color Doppler imaging. Femoral Vein: No evidence of thrombus. Normal compressibility, respiratory phasicity and response to augmentation. Popliteal Vein: No evidence of thrombus. Normal compressibility, respiratory phasicity and response to augmentation. Calf Veins: No evidence of thrombus. Normal compressibility and flow on color Doppler imaging. Superficial Great Saphenous Vein: No evidence of thrombus. Normal compressibility. Venous Reflux:  None. Other Findings: Elongated complex fluid collection in the posterolateral aspect of the right upper extremity measuring approximately 25 x 2.6 cm. A second complex fluid collection is identified in the posterior aspect of the distal thigh measuring 12.5 x 1.8 x 2.7 cm. IMPRESSION: 1. No evidence of deep venous thrombosis. 2. Multiple large complex fluid collections present in the region of the right hip and posterolateral thigh. Correlation with prior MRI imaging demonstrates similar findings. 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 Most recent prior aspiration of 1 of the fluid collections was performed on 12/17/2017. Electronically Signed   By: Jacqulynn Cadet M.D.   On: 02/18/2018 14:36   [place] Ekg Site Rite  Result Date: 01/26/2018 If Site Rite image not attached, placement could not be confirmed due  to current cardiac rhythm.   (Echo, Carotid, EGD, Colonoscopy, ERCP)    Subjective:   Discharge Exam: Vitals:   02/21/18 2044 02/22/18 0445  BP: 119/62 (!) 110/55  Pulse: 75 82  Resp: 20 18  Temp: (!) 97.4 F (36.3 C) 98.2 F (36.8 C)  SpO2: 100% 96%   Vitals:   02/20/18 2117 02/21/18 1401 02/21/18 2044 02/22/18 0445  BP: (!) 110/56 (!) 103/53 119/62 (!) 110/55  Pulse: 81 75 75 82  Resp: 18 16 20 18   Temp: 99.1 F (37.3 C) 98 F (36.7 C) (!) 97.4 F (36.3 C) 98.2 F (36.8 C)  TempSrc: Oral Oral     SpO2: 98% 98% 100% 96%  Weight:      Height:        General: Pt is alert, awake, not in acute distress Cardiovascular: RRR, S1/S2 +, no rubs, no gallops Respiratory: CTA bilaterally, no wheezing, no rhonchi Abdominal: Soft, NT, ND, bowel sounds + Extremities: no drainage no edema  The results of significant diagnostics from this hospitalization (including imaging, microbiology, ancillary and laboratory) are listed below for reference.     Microbiology: Recent Results (from the past 240 hour(s))  Blood culture (routine single)     Status: None (Preliminary result)   Collection Time: 02/18/18  5:09 PM  Result Value Ref Range Status   MICRO NUMBER: [id number]  Preliminary   SPECIMEN QUALITY: ADEQUATE  Preliminary   Source BLOOD, LEFT ARM  Preliminary   STATUS: PRELIMINARY  Preliminary   Result:   Preliminary    No growth to date. Culture is continuously monitored for a total of 120 hours incubation. A change in status will result in a phone report followed by an updated printed culture report.   COMMENT: Aerobic and anaerobic bottle received.  Preliminary  Blood culture (routine single)     Status: None (Preliminary result)   Collection Time: 02/18/18  5:12 PM  Result Value Ref Range Status   MICRO NUMBER: [id number]  Preliminary   SPECIMEN QUALITY: SUBOPTIMAL  Preliminary   Source OTHER (SPECIFY)  Preliminary   STATUS: PRELIMINARY  Preliminary   Result:   Preliminary    No growth to date. Culture is continuously monitored for a total of 120 hours incubation. A change in status will result in a phone report followed by an updated printed culture report. Visual inspection of blood culture bottles indicates that an  inadequate volume of blood may have been collected for the detection of sepsis.    COMMENT: Aerobic and anaerobic bottle received.  Preliminary  Culture, blood (Routine x 2)     Status: None (Preliminary result)   Collection Time: 02/20/18 12:50 AM  Result Value Ref Range  Status   Specimen Description   Final    BLOOD RIGHT ARM PICC LINE Performed at [facility], [address], [place], [place] [address]    Special Requests   Final    BOTTLES DRAWN AEROBIC AND ANAEROBIC Blood Culture adequate volume Performed at [facility] [address] [place], [place] [address]    Culture   Final    NO GROWTH 2 DAYS Performed at [facility] [address], [place], [place] [address]    Report Status PENDING  Incomplete  Culture, blood (Routine x 2)     Status: None (Preliminary result)   Collection Time: 02/20/18  1:19 AM  Result Value Ref Range Status   Specimen Description   Final  BLOOD LEFT ANTECUBITAL Performed at [facility] [address], [place], [place] [address]    Special Requests   Final    BOTTLES DRAWN AEROBIC AND ANAEROBIC Blood Culture adequate volume Performed at [facility] [address] [place], [place] [address]    Culture   Final    NO GROWTH 2 DAYS Performed at [facility] [address], [place], [place] [address]    Report Status PENDING  Incomplete     Labs: BNP (last 3 results) No results for input(s): BNP in the last 8760 hours. Basic Metabolic Panel: Recent Labs  Lab 02/20/18 0058 02/21/18 0824 02/22/18 0405  NA 141 141 143  K 3.4* 3.4* 3.3*  CL 105 109 113*  CO2 25 24 25   GLUCOSE 139* 155* 100*  BUN 13 12 12   CREATININE 0.69 0.74 0.75  CALCIUM 8.5* 8.3* 8.0*   Liver Function Tests: Recent Labs  Lab 02/20/18 0058  AST 28  ALT 24  ALKPHOS 74  BILITOT 0.5  PROT 6.0*  ALBUMIN 2.7*   No results for input(s): LIPASE, AMYLASE in the last 168 hours. No results for input(s): AMMONIA in the last 168 hours. CBC: Recent Labs  Lab 02/18/18 1653 02/20/18 0058 02/21/18 0824 02/22/18 0405  WBC 6.7 8.3 7.8 8.0  NEUTROABS 4,663 6.9 6.2 5.7  HGB 10.1* 8.9* 8.4* 8.3*  HCT 30.5* 28.0* 26.0* 25.4*  MCV 89.7 92.4 92.2 92.0   PLT 291 291 272 290   Cardiac Enzymes: No results for input(s): CKTOTAL, CKMB, CKMBINDEX, TROPONINI in the last 168 hours. BNP: Invalid input(s): POCBNP CBG: No results for input(s): GLUCAP in the last 168 hours. D-Dimer No results for input(s): DDIMER in the last 72 hours. Hgb A1c No results for input(s): HGBA1C in the last 72 hours. Lipid Profile No results for input(s): CHOL, HDL, LDLCALC, TRIG, CHOLHDL, LDLDIRECT in the last 72 hours. Thyroid function studies No results for input(s): TSH, T4TOTAL, T3FREE, THYROIDAB in the last 72 hours.  Invalid input(s): FREET3 Anemia work up No results for input(s): VITAMINB12, FOLATE, FERRITIN, TIBC, IRON, RETICCTPCT in the last 72 hours. Urinalysis    Component Value Date/Time   COLORURINE YELLOW 02/20/2018 0056   APPEARANCEUR CLEAR 02/20/2018 0056   LABSPEC 1.015 02/20/2018 0056   PHURINE 7.0 02/20/2018 0056   GLUCOSEU NEGATIVE 02/20/2018 0056   HGBUR NEGATIVE 02/20/2018 0056   BILIRUBINUR NEGATIVE 02/20/2018 0056   KETONESUR NEGATIVE 02/20/2018 0056   PROTEINUR NEGATIVE 02/20/2018 0056   NITRITE NEGATIVE 02/20/2018 0056   LEUKOCYTESUR NEGATIVE 02/20/2018 0056   Sepsis Labs Invalid input(s): PROCALCITONIN,  WBC,  LACTICIDVEN Microbiology Recent Results (from the past 240 hour(s))  Blood culture (routine single)     Status: None (Preliminary result)   Collection Time: 02/18/18  5:09 PM  Result Value Ref Range Status   MICRO NUMBER: [id number]  Preliminary   SPECIMEN QUALITY: ADEQUATE  Preliminary   Source BLOOD, LEFT ARM  Preliminary   STATUS: PRELIMINARY  Preliminary   Result:   Preliminary    No growth to date. Culture is continuously monitored for a total of 120 hours incubation. A change in status will result in a phone report followed by an updated printed culture report.   COMMENT: Aerobic and anaerobic bottle received.  Preliminary  Blood culture (routine single)     Status: None (Preliminary result)   Collection Time:  02/18/18  5:12 PM  Result Value Ref Range Status   MICRO NUMBER: [id number]  Preliminary   SPECIMEN QUALITY: SUBOPTIMAL  Preliminary   Source OTHER (SPECIFY)  Preliminary   STATUS: PRELIMINARY  Preliminary   Result:   Preliminary    No growth to date. Culture is continuously monitored for a total of 120 hours incubation. A change in status will result in a phone report followed by an updated printed culture report. Visual inspection of blood culture bottles indicates that an  inadequate volume of blood may have been collected for the detection of sepsis.    COMMENT: Aerobic and anaerobic bottle received.  Preliminary  Culture, blood (Routine x 2)     Status: None (Preliminary result)   Collection Time: 02/20/18 12:50 AM  Result Value Ref Range Status   Specimen Description   Final    BLOOD RIGHT ARM PICC LINE Performed at [facility], [address] [place], [place] [address]    Special Requests   Final    BOTTLES DRAWN AEROBIC AND ANAEROBIC Blood Culture adequate volume Performed at [facility] [address], [place], [place] [address]    Culture   Final    NO GROWTH 2 DAYS Performed at [facility] [address], [place], [place] [address]    Report Status PENDING  Incomplete  Culture, blood (Routine x 2)     Status: None (Preliminary result)   Collection Time: 02/20/18  1:19 AM  Result Value Ref Range Status   Specimen Description   Final    BLOOD LEFT ANTECUBITAL Performed at [facility] [address] [place], [place] [address]    Special Requests   Final    BOTTLES DRAWN AEROBIC AND ANAEROBIC Blood Culture adequate volume Performed at [facility] [address], [place], [place] [address]    Culture   Final    NO GROWTH 2 DAYS Performed at [facility] [address] [place], [place] [address]    Report Status PENDING  Incomplete     Time coordinating discharge:35  minutes  SIGNED:   Georgette Shell, MD  [facility] 02/22/2018, 11:49 AM Pager   If 7PM-7AM, please contact night-coverage [email or web address] Password [email or web address]

## 2018-02-22 NOTE — Care Management Note (Addendum)
Case Management Note  Patient Details  Name: Travis Wolfe MRN: [id number] Date of Birth: 04-Jan-1935  Subjective/Objective:    Sepsis likely right hip arthroplasty               Action/Plan: Contacted [facility] for resumption of HH for IV abx. Will need new Rx for IV abx (OPAT) and HH RN orders. NCM notified attending for orders and Rx for IV abx. Pt has doses of Cefepime and Vancomycin at home.   02/22/2018 2:10 pm Notified [facility] that OPAT orders for IV abx are complete and HH orders are complete.   Expected Discharge Date:  02/22/18               Expected Discharge Plan:  Home w Home Health Services  In-House Referral:  NA  Discharge planning Services  CM Consult  Post Acute Care Choice:  Home Health, Resumption of Svcs/PTA Provider Choice offered to:  Patient  DME Arranged:  N/A DME Agency:  NA  HH Arranged:  RN HH Agency:  [facility]  Status of Service:  Completed, signed off  If discussed at Long Length of Stay Meetings, dates discussed:    Additional Comments:  Elliot Cousin, RN 02/22/2018, 1:28 PM

## 2018-02-22 NOTE — Care Management Important Message (Signed)
Important Message  Patient Details  Name: Travis Wolfe MRN: [id number] Date of Birth: 1934-10-09   Medicare Important Message Given:  Yes    Elliot Cousin, RN 02/22/2018, 2:14 PM

## 2018-02-22 NOTE — Progress Notes (Signed)
Pt requesting to be discharged now. States he has an appointment scheduled with Ortho MD Charlann Boxer tomorrow AM. MD Jerolyn Center made aware and ordered D/C.

## 2018-02-24 LAB — CULTURE, BLOOD (SINGLE)
MICRO NUMBER:: 91058270
MICRO NUMBER:: 91058272
RESULT: NO GROWTH
SPECIMEN QUALITY: ADEQUATE

## 2018-02-25 ENCOUNTER — Telehealth: Payer: Self-pay | Admitting: Behavioral Health

## 2018-02-25 ENCOUNTER — Telehealth: Payer: Self-pay | Admitting: Internal Medicine

## 2018-02-25 LAB — CULTURE, BLOOD (ROUTINE X 2)
CULTURE: NO GROWTH
Culture: NO GROWTH
Special Requests: ADEQUATE
Special Requests: ADEQUATE

## 2018-02-25 NOTE — Telephone Encounter (Signed)
Travis Wolfe was seen last week for fever that was thought to be due to vancomycin. He was readmitted for fever work up where no new information found -- they still decided to keep on vancomycin (though lower dose) plus ceftriaoxne. He had isolated fever of 103 last night lasting 2 hrs. I have asked his wife to hold his ceftriaxone to see if fevers dissipate. She is call if fevers persist. We will see her early next week

## 2018-02-25 NOTE — Telephone Encounter (Signed)
Patient and his wife called today.  Patient wanted to inform Dr. Drue Second that he spiked a temperature of 103.8 last night at 7:00pm but it was back down to 98.6 at 8:00pm   Wife stated that Dr. Drue Second needed to be notified if this happened again  She states this is what was happening in the hospital. Angeline Slim RN

## 2018-03-09 ENCOUNTER — Ambulatory Visit (INDEPENDENT_AMBULATORY_CARE_PROVIDER_SITE_OTHER): Payer: Medicare Other | Admitting: Internal Medicine

## 2018-03-09 ENCOUNTER — Encounter: Payer: Self-pay | Admitting: Internal Medicine

## 2018-03-09 VITALS — BP 142/78 | HR 75 | Temp 98.6°F | Wt 136.0 lb

## 2018-03-09 DIAGNOSIS — T8451XS Infection and inflammatory reaction due to internal right hip prosthesis, sequela: Secondary | ICD-10-CM

## 2018-03-09 NOTE — Progress Notes (Signed)
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 Who performed  PROCEDURE: 1.  Excisional and nonexcisional debridement of right hip.  This included a 10 inch incision of subcutaneous tissue and skin nonviable pericapsular tissue as well as nonexcisional debridement with 9 liters of normal saline solution. 2.  Revision of the patient's femoral head to a Stryker 32+5 C taper head with a V40/C taper titanium sleeve.  The Or cultures did not isolate any bacteria. 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 However the course was complicated when he started to have  fevers that initially thought to be due to vancomycin but then it was ceftriaxone Just on vancomycin to end tomorrow  Sed rate - 51, crp 16 from the 16th  He states that he has less fluid collection noted in his leg. Not necessarily having any fevers ,chlls, nightsweats, or erythema about the affected area.  He still has poor appetite. Has lost weight during this process of getting abtx. He suspects his appetite has been decreased. Outpatient Encounter Medications as of 03/09/2018  Medication Sig  . apixaban (ELIQUIS) 5 MG TABS tablet Take 5 mg by mouth 2 (two) times daily.   Marland Kitchen atorvastatin (LIPITOR) 40 MG tablet Take 40 mg by mouth daily.  . cefTRIAXone (ROCEPHIN) IVPB Inject 2 g into the vein daily for 16 days. Indication:  PJI Last Day of Therapy:  02/28/18 Labs - Once weekly:  CBC/D and BMP, Labs - Every other week:  ESR and CRP  . COSOPT PF 22.3-6.8 MG/ML SOLN ophthalmic solution Place 1 drop into both eyes every morning.  . Eszopiclone 3 MG TABS Take 3 mg by mouth at bedtime.  . finasteride (PROSCAR) 5 MG tablet Take 5 mg by mouth daily.  . flecainide (TAMBOCOR) 50 MG tablet Take 50 mg by mouth 2 (two) times daily.  Marland Kitchen  HYDROcodone-acetaminophen (NORCO) 7.5-325 MG tablet Take 1-2 tablets by mouth every 4 (four) hours as needed for moderate pain.  . iron polysaccharides (NIFEREX) 150 MG capsule Take 150 mg by mouth daily.  Marland Kitchen LUMIGAN 0.01 % SOLN Place 1 drop into both eyes at bedtime.   Marland Kitchen LYCOPENE PO Take 1 tablet by mouth daily.  . methocarbamol (ROBAXIN) 500 MG tablet Take 1 tablet (500 mg total) by mouth every 6 (six) hours as needed for muscle spasms.  . multivitamin-lutein (OCUVITE-LUTEIN) CAPS capsule Take 1 capsule by mouth daily.  Marland Kitchen NARCAN 4 MG/0.1ML LIQD nasal spray kit Place 1 spray into the nose once.   Marland Kitchen NEXIUM 40 MG capsule Take 40 mg by mouth daily.   . polyethylene glycol (MIRALAX / GLYCOLAX) packet Take 17 g by mouth 2 (two) times daily. (Patient taking differently: Take 17 g by mouth daily as needed for mild constipation. )  . vancomycin IVPB Inject 1,000 mg into the vein daily for 16 days. Indication:  PJI Last Day of Therapy:  03/10/18 Labs - Sunday/Monday:  CBC/D, BMP, and vancomycin trough. Labs - Thursday:  BMP and vancomycin trough Labs - Every other week:  ESR and CRP   No facility-administered encounter medications on file as of 03/09/2018.      Patient Active Problem List   Diagnosis Date Noted  . Pain   . Fever 02/20/2018  . Metallosis   .  Staphylococcus infection   . Infection of right prosthetic hip joint ([place]) 01/26/2018  . S/P hip replacement 01/26/2018  . Cellulitis of right leg 12/15/2017  . PAF (paroxysmal atrial fibrillation) ([place]) 12/15/2017  . Abdominal wall pain in right lower quadrant 12/02/2012     Health Maintenance Due  Topic Date Due  . TETANUS/TDAP  01/21/2017  . INFLUENZA VACCINE  01/15/2018    Social History   Tobacco Use  . Smoking status: Never Smoker  . Smokeless tobacco: Never Used  Substance Use Topics  . Alcohol use: Not Currently    Comment: socially   . Drug use: No   Review of Systems + weight loss, unintentional 12 point ros is  negative otherwise Physical Exam   BP (!) 142/78   Pulse 75   Temp 98.6 F (37 C) (Oral)   Wt 136 lb (61.7 kg)   BMI 20.68 kg/m   Physical Exam  Constitutional: He is oriented to person, place, and time. He appears well-developed and well-nourished. No distress.  HENT:  Mouth/Throat: Oropharynx is clear and moist. No oropharyngeal exudate.  Cardiovascular: Normal rate, regular rhythm and normal heart sounds. Exam reveals no gallop and no friction rub.  No murmur heard.  Pulmonary/Chest: Effort normal and breath sounds normal. No respiratory distress. He has no wheezes.  Ext: picc line is c/d/i Neurological: He is alert and oriented to person, place, and time.  Skin: Skin is warm and dry. No rash noted. No erythema.  Psychiatric: He has a normal mood and affect. His behavior is normal.    CBC Lab Results  Component Value Date   WBC 8.0 02/22/2018   RBC 2.76 (L) 02/22/2018   HGB 8.3 (L) 02/22/2018   HCT 25.4 (L) 02/22/2018   PLT 290 02/22/2018   MCV 92.0 02/22/2018   MCH 30.1 02/22/2018   MCHC 32.7 02/22/2018   RDW 15.0 02/22/2018   LYMPHSABS 0.7 02/22/2018   MONOABS 0.7 02/22/2018   EOSABS 0.8 (H) 02/22/2018    BMET Lab Results  Component Value Date   NA 143 02/22/2018   K 3.3 (L) 02/22/2018   CL 113 (H) 02/22/2018   CO2 25 02/22/2018   GLUCOSE 100 (H) 02/22/2018   BUN 12 02/22/2018   CREATININE 0.75 02/22/2018   CALCIUM 8.0 (L) 02/22/2018   GFRNONAA >60 02/22/2018   GFRAA >60 02/22/2018    ASSESSMENT/PLAN:  Will finish off his abtx tomorrow, have picc line pulled Would plan on just stopping abtx and observe off of abtx  I suspect that his weight loss is due indirectly to abtx affecting his appetite Would rather him regain his strength, muscle mass, and weight in terms of rehabilitation especially if he were to need further surgery I agree with previous assessments that this is most likely metallosis rather than indolent infection.

## 2018-03-11 ENCOUNTER — Telehealth: Payer: Self-pay

## 2018-03-11 NOTE — Telephone Encounter (Signed)
Clois DupesSharron, Risk managerffice specialist [facility] [address] called asking for last office note and ICD-9 code relating to orders for physical therapy. Unable to give office note at the time due not it not being completed. Will route message to Dr. Drue SecondSnider for ICD-9 code and for last office note. [facility] at Fax: [phone] Phone: [phone] Lorenso CourierJose L Maldonado, [place]

## 2018-03-13 NOTE — Telephone Encounter (Signed)
Office Note faxed to Sydell Axon physical therapist at [facility].

## 2018-04-08 ENCOUNTER — Other Ambulatory Visit: Payer: Medicare Other

## 2018-04-08 DIAGNOSIS — T8451XS Infection and inflammatory reaction due to internal right hip prosthesis, sequela: Secondary | ICD-10-CM

## 2018-04-09 LAB — C-REACTIVE PROTEIN: CRP: 15.2 mg/L — AB (ref <8.0)

## 2018-04-09 LAB — SEDIMENTATION RATE: SED RATE: 11 mm/h (ref 0–20)

## 2018-04-15 ENCOUNTER — Telehealth: Payer: Self-pay

## 2018-04-15 NOTE — Telephone Encounter (Signed)
Patient called for lab results.   Please advise.   Laurell Josephs, RN

## 2018-04-29 NOTE — Telephone Encounter (Signed)
Patient called for lab results and wanting to know his next steps.  Patient requesting an update from Dr. Drue SecondSnider.  Patient also wants to know does he still need to come to his follow up appointment on 05/18/2018. Angeline SlimAshley Hill RN

## 2018-04-30 ENCOUNTER — Telehealth: Payer: Self-pay

## 2018-04-30 NOTE — Telephone Encounter (Signed)
Plan of Care reviewed/signed by Dr. Drue SecondSnider and faxed back

## 2018-05-18 ENCOUNTER — Encounter: Payer: Self-pay | Admitting: Internal Medicine

## 2018-05-18 ENCOUNTER — Ambulatory Visit (INDEPENDENT_AMBULATORY_CARE_PROVIDER_SITE_OTHER): Payer: Medicare Other | Admitting: Internal Medicine

## 2018-05-18 VITALS — BP 154/72 | HR 65 | Temp 97.5°F | Ht 68.0 in | Wt 145.0 lb

## 2018-05-18 DIAGNOSIS — T5691XS Toxic effect of unspecified metal, accidental (unintentional), sequela: Secondary | ICD-10-CM | POA: Diagnosis not present

## 2018-05-18 DIAGNOSIS — T8451XS Infection and inflammatory reaction due to internal right hip prosthesis, sequela: Secondary | ICD-10-CM

## 2018-05-18 DIAGNOSIS — Z96641 Presence of right artificial hip joint: Secondary | ICD-10-CM

## 2018-05-18 NOTE — Progress Notes (Signed)
RFV: follow up on revision of hip prosthesis  Patient ID: Travis Wolfe, male   DOB: 18-Dec-1934, 83 y.o.   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 Who performed  PROCEDURE: 1. Excisional and nonexcisional debridement of right hip. This included a 10 inch incision of subcutaneous tissue and skin nonviable pericapsular tissue as well as nonexcisional debridement with 9 liters of normal saline solution. 2. Revision of the patient's femoral head to a Stryker 32+5 C taper head with a V40/C taper titanium sleeve.  The Or cultures did not isolate any bacteria. 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 However the course was complicated when he started to have  fevers that initially thought to be due to vancomycin but then it was ceftriaxone Just on vancomycin which he completed 6 wk course.  Sed rate - 51, crp 16 from the 16th but now his inflammatory markers have normalized  He states that he has less fluid collection noted in his leg. Not necessarily having any fevers ,chlls, nightsweats, or erythema about the affected area.  Outpatient Encounter Medications as of 05/18/2018  Medication Sig  . apixaban (ELIQUIS) 5 MG TABS tablet Take 5 mg by mouth 2 (two) times daily.   Marland Kitchen atorvastatin (LIPITOR) 40 MG tablet Take 40 mg by mouth daily.  . COSOPT PF 22.3-6.8 MG/ML SOLN ophthalmic solution Place 1 drop into both eyes every morning.  . Eszopiclone 3 MG TABS Take 3 mg by mouth at bedtime.  . finasteride (PROSCAR) 5 MG tablet Take 5 mg by mouth daily.  . flecainide (TAMBOCOR) 50 MG tablet Take 50 mg by mouth 2 (two) times daily.  . iron polysaccharides (NIFEREX) 150 MG capsule Take 150 mg by mouth daily.  Marland Kitchen LUMIGAN 0.01 % SOLN Place 1 drop into both eyes at bedtime.   Marland Kitchen LYCOPENE PO Take 1 tablet by mouth daily.  . multivitamin-lutein (OCUVITE-LUTEIN) CAPS capsule Take 1 capsule by mouth  daily.  Marland Kitchen NARCAN 4 MG/0.1ML LIQD nasal spray kit Place 1 spray into the nose once.   Marland Kitchen NEXIUM 40 MG capsule Take 40 mg by mouth daily.   . polyethylene glycol (MIRALAX / GLYCOLAX) packet Take 17 g by mouth 2 (two) times daily. (Patient taking differently: Take 17 g by mouth daily as needed for mild constipation. )  . HYDROcodone-acetaminophen (NORCO) 7.5-325 MG tablet Take 1-2 tablets by mouth every 4 (four) hours as needed for moderate pain. (Patient not taking: Reported on 05/18/2018)  . methocarbamol (ROBAXIN) 500 MG tablet Take 1 tablet (500 mg total) by mouth every 6 (six) hours as needed for muscle spasms. (Patient not taking: Reported on 05/18/2018)   No facility-administered encounter medications on file as of 05/18/2018.      Patient Active Problem List   Diagnosis Date Noted  . Pain   . Fever 02/20/2018  . Metallosis   . Staphylococcus infection   . Infection of right prosthetic hip joint ([place]) 01/26/2018  . S/P hip replacement 01/26/2018  . Cellulitis of right leg 12/15/2017  . PAF (paroxysmal atrial fibrillation) ([place]) 12/15/2017  . Abdominal wall pain in right lower quadrant 12/02/2012     Health Maintenance Due  Topic Date Due  . TETANUS/TDAP  01/21/2017  . INFLUENZA VACCINE  01/15/2018    Social History   Tobacco Use  . Smoking status: Never Smoker  . Smokeless tobacco: Never Used  Substance Use Topics  . Alcohol use: Not Currently    Comment: socially   . Drug use: No  - his wife is retiring from nursing  Review of Systems 12 point ros reviewed is negative Physical Exam  BP (!) 154/72   Pulse 65   Temp (!) 97.5 F (36.4 C) (Oral)   Ht 5' 8"  (1.727 m)   Wt 145 lb (65.8 kg)   BMI 22.05 kg/m  Physical Exam  Constitutional: He is oriented to person, place, and time. He appears well-developed and well-nourished. No distress.  HENT:  Mouth/Throat: Oropharynx is clear and moist. No oropharyngeal exudate.  Ext: right leg smaller fluid collection near  posterior knee Skin: Skin is warm and dry. No rash noted. No erythema.  Psychiatric: He has a normal mood and affect. His behavior is normal.    CBC Lab Results  Component Value Date   WBC 8.0 02/22/2018   RBC 2.76 (L) 02/22/2018   HGB 8.3 (L) 02/22/2018   HCT 25.4 (L) 02/22/2018   PLT 290 02/22/2018   MCV 92.0 02/22/2018   MCH 30.1 02/22/2018   MCHC 32.7 02/22/2018   RDW 15.0 02/22/2018   LYMPHSABS 0.7 02/22/2018   MONOABS 0.7 02/22/2018   EOSABS 0.8 (H) 02/22/2018    BMET Lab Results  Component Value Date   NA 143 02/22/2018   K 3.3 (L) 02/22/2018   CL 113 (H) 02/22/2018   CO2 25 02/22/2018   GLUCOSE 100 (H) 02/22/2018   BUN 12 02/22/2018   CREATININE 0.75 02/22/2018   CALCIUM 8.0 (L) 02/22/2018   GFRNONAA >60 02/22/2018   GFRAA >60 02/22/2018     Assessment and Plan  Hx of metallosis, presumed pji = inflammatory markers have normalized. No need for further abtx. His inflam markers have remained WNL despite being off of oral abtx suppression.   Will not need further treatment at this time. He can return to clinic as needed

## 2018-05-19 LAB — C-REACTIVE PROTEIN: CRP: 0.9 mg/L (ref <8.0)

## 2018-05-19 LAB — SEDIMENTATION RATE: SED RATE: 2 mm/h (ref 0–20)

## 2018-07-08 ENCOUNTER — Telehealth: Payer: Self-pay | Admitting: Behavioral Health

## 2018-07-08 NOTE — Telephone Encounter (Signed)
Attempted to call patient.  Voicemail box was full so unable to leave a message. Angeline Slim RN

## 2018-07-08 NOTE — Telephone Encounter (Signed)
-----   Message from Johaura Celedonio sent at 07/08/2018 10:26 AM EST ----- Regarding: Blood work Patient called saying that he had a blood work done at his PCP and his white blood count is elevated. Asked to see Dr Drue Second ASAP, but next available is not until 07/20/18. Please call patient to find out if he needs to be seen sooner than that

## 2018-07-10 NOTE — Telephone Encounter (Signed)
RN spoke with patient, received labs from PCP and had Travis Eke, NP review for urgent appointment request.  Per Travis Wolfe, bloodwork is normal, no need to be seen. RN relayed this information to the patient.  He stated he is feeling better, but his wife (former Engineer, civil (consulting)) is still concerned about returning infection, will keep appointment 2/3 with Travis Wolfe.  He was hospitalized recently in [place] for AFib episode.  RN encouraged him to follow up with his cardiologist as well.  Andree Coss, RN

## 2018-07-20 ENCOUNTER — Ambulatory Visit: Payer: Medicare Other | Admitting: Internal Medicine

## 2018-08-03 ENCOUNTER — Telehealth: Payer: Self-pay | Admitting: *Deleted

## 2018-08-03 NOTE — Telephone Encounter (Signed)
Patient called to report that his hip pain is back and worse. He also says that he hit it recently and the pain and rednedd increased. He has an appointment to see Drue Second 08/17/18 at 215 and wants to know if she would like for him to have any labs prior to his visit. Advised will ask and give him a call back as soon as possible.

## 2018-08-10 NOTE — Telephone Encounter (Signed)
Please let Travis Wolfe know that he should go see orthopedics first to be evaluated if he is noticing worsening pain and redness to his hip

## 2018-08-17 ENCOUNTER — Encounter: Payer: Self-pay | Admitting: Internal Medicine

## 2018-08-17 ENCOUNTER — Ambulatory Visit (INDEPENDENT_AMBULATORY_CARE_PROVIDER_SITE_OTHER): Payer: Medicare Other | Admitting: Internal Medicine

## 2018-08-17 VITALS — BP 154/73 | HR 73 | Temp 98.3°F | Wt 153.0 lb

## 2018-08-17 DIAGNOSIS — T8451XS Infection and inflammatory reaction due to internal right hip prosthesis, sequela: Secondary | ICD-10-CM

## 2018-08-17 MED ORDER — GABAPENTIN 300 MG PO CAPS: 300.0000 mg | ORAL_CAPSULE | Freq: Every day | ORAL | 0 refills | Status: DC

## 2018-08-17 NOTE — Progress Notes (Signed)
Patient ID: Travis Wolfe, male   DOB: 04-01-1935, 84 y.o.   MRN: [id number]  HPI   Outpatient Encounter Medications as of 08/17/2018  Medication Sig  . apixaban (ELIQUIS) 5 MG TABS tablet Take 5 mg by mouth 2 (two) times daily.   Marland Kitchen atorvastatin (LIPITOR) 40 MG tablet Take 40 mg by mouth daily.  . COSOPT PF 22.3-6.8 MG/ML SOLN ophthalmic solution Place 1 drop into both eyes every morning.  . Eszopiclone 3 MG TABS Take 3 mg by mouth at bedtime.  . finasteride (PROSCAR) 5 MG tablet Take 5 mg by mouth daily.  . flecainide (TAMBOCOR) 50 MG tablet Take 50 mg by mouth 2 (two) times daily.  Marland Kitchen HYDROcodone-acetaminophen (NORCO) 7.5-325 MG tablet Take 1-2 tablets by mouth every 4 (four) hours as needed for moderate pain.  . iron polysaccharides (NIFEREX) 150 MG capsule Take 150 mg by mouth daily.  Marland Kitchen LUMIGAN 0.01 % SOLN Place 1 drop into both eyes at bedtime.   Marland Kitchen LYCOPENE PO Take 1 tablet by mouth daily.  . methocarbamol (ROBAXIN) 500 MG tablet Take 1 tablet (500 mg total) by mouth every 6 (six) hours as needed for muscle spasms.  . multivitamin-lutein (OCUVITE-LUTEIN) CAPS capsule Take 1 capsule by mouth daily.  Marland Kitchen NARCAN 4 MG/0.1ML LIQD nasal spray kit Place 1 spray into the nose once.   Marland Kitchen NEXIUM 40 MG capsule Take 40 mg by mouth daily.   . polyethylene glycol (MIRALAX / GLYCOLAX) packet Take 17 g by mouth 2 (two) times daily. (Patient taking differently: Take 17 g by mouth daily as needed for mild constipation. )   No facility-administered encounter medications on file as of 08/17/2018.      Patient Active Problem List   Diagnosis Date Noted  . Pain   . Fever 02/20/2018  . Metallosis   . Staphylococcus infection   . 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  . S/P hip replacement 01/26/2018  . Cellulitis of right leg 12/15/2017  . PAF (paroxysmal atrial fibrillation) ([facility]) 12/15/2017  . Abdominal wall pain in right lower quadrant 12/02/2012     Health Maintenance  Due  Topic Date Due  . TETANUS/TDAP  01/21/2017     Review of Systems  Physical Exam   BP (!) 154/73   Pulse 73   Temp 98.3 F (36.8 C)   Wt 153 lb (69.4 kg)   BMI 23.26 kg/m    No results found for: CD4TCELL No results found for: CD4TABS No results found for: HIV1RNAQUANT No results found for: HEPBSAB No results found for: RPR, LABRPR  CBC Lab Results  Component Value Date   WBC 8.0 02/22/2018   RBC 2.76 (L) 02/22/2018   HGB 8.3 (L) 02/22/2018   HCT 25.4 (L) 02/22/2018   PLT 290 02/22/2018   MCV 92.0 02/22/2018   MCH 30.1 02/22/2018   MCHC 32.7 02/22/2018   RDW 15.0 02/22/2018   LYMPHSABS 0.7 02/22/2018   MONOABS 0.7 02/22/2018   EOSABS 0.8 (H) 02/22/2018    BMET Lab Results  Component Value Date   NA 143 02/22/2018   K 3.3 (L) 02/22/2018   CL 113 (H) 02/22/2018   CO2 25 02/22/2018   GLUCOSE 100 (H) 02/22/2018   BUN 12 02/22/2018   CREATININE 0.75 02/22/2018   CALCIUM 8.0 (L) 02/22/2018   GFRNONAA >60 02/22/2018   GFRAA >60 02/22/2018      Assessment and Plan

## 2018-08-26 ENCOUNTER — Telehealth: Payer: Self-pay

## 2018-08-26 NOTE — Telephone Encounter (Signed)
Patient called office today to inform Dr. Drue Second that he had his MRI read by Dr. Charlann Boxer. Dr. Charlann Boxer is recommending that patient have surgery on his hip to decrease swelling.  Will route message to MD so she is aware.  Lorenso Courier, [place]

## 2018-09-15 ENCOUNTER — Ambulatory Visit: Admit: 2018-09-15 | Payer: Medicare Other | Admitting: Orthopedic Surgery

## 2018-09-15 SURGERY — IRRIGATION AND DEBRIDEMENT HIP
Anesthesia: Spinal | Laterality: Right

## 2018-10-03 ENCOUNTER — Other Ambulatory Visit: Payer: Self-pay | Admitting: Internal Medicine

## 2018-11-05 NOTE — Progress Notes (Signed)
Please place orders in Epic as patient has a pre-op appointment on 11/19/2018! Thank you! Patient's DOB-[birth year] and surgery is 11/26/2018.

## 2018-11-18 NOTE — Progress Notes (Addendum)
03/20/2018- noted in Epic- office visit with Dr. Chauncy Passy  02/21/2018- noted in Epic- CT chest w/contrast  01/21/2018- noted in Epic- EKG

## 2018-11-18 NOTE — Patient Instructions (Addendum)
Travis Wolfe  11/18/2018   Your procedure is scheduled on: Thursday 11/26/2018  Report to [facility] Main  Entrance              Report to Short Stay at  0530  AM               YOU NEED TO HAVE A COVID 19 TEST ON 11-23-18  :20 PM, THIS TEST MUST BE DONE BEFORE SURGERY, COME TO [facility] ENTRANCE. ONCE COVID TEST IS COMPLETED, PLEASE BEGIN QUARANTINE  INSTRUCTIONS AS INDICATED IN YOUR HANDOUT.   Call this number if you have problems the morning of surgery [phone]    Remember: Do not eat food  :After Midnight.               NO SOLID FOOD AFTER MIDNIGHT THE NIGHT PRIOR TO SURGERY. NOTHING BY MOUTH EXCEPT CLEAR LIQUIDS  Until 0430 am.               PLEASE FINISH ENSURE DRINK PER SURGEON ORDER  WHICH NEEDS TO BE COMPLETED AT  0430 AM.   CLEAR LIQUID DIET   Foods Allowed                                                                     Foods Excluded  Coffee and tea, regular and decaf                             liquids that you cannot  Plain Jell-O in any flavor                                             see through such as: Fruit ices (not with fruit pulp)                                     milk, soups, orange juice  Iced Popsicles                                    All solid food Carbonated beverages, regular and diet                                    Cranberry, grape and apple juices Sports drinks like Gatorade Lightly seasoned clear broth or consume(fat free) Sugar, honey syrup  Sample Menu Breakfast                                Lunch                                     Supper Cranberry juice  Beef broth                            Chicken broth Jell-O                                     Grape juice                           Apple juice Coffee or tea                        Jell-O                                      Popsicle                                                Coffee or tea                         Coffee or tea  _____________________________________________________________________               BRUSH YOUR TEETH MORNING OF SURGERY AND RINSE YOUR MOUTH OUT, NO CHEWING GUM CANDY OR MINTS.     Take these medicines the morning of surgery with A SIP OF WATER: Atorvastatin (Lipitor), Flecainide (Tambocor),Pantoprazole, prn. Use eye drops                                 You may not have any metal on your body including hair pins and              piercings     Do not wear jewelry, cologne, lotions, powders or perfumes, deodorant                          Men may shave face and neck.   Do not bring valuables to the hospital. [facility] IS NOT             RESPONSIBLE   FOR VALUABLES.  Contacts, dentures or bridgework may not be worn into surgery.  Leave suitcase in the car. After surgery it may be brought to your room.                  Please read over the following fact sheets you were given: _____________________________________________________________________             [facility] - Preparing for Surgery Before surgery, you can play an important role.  Because skin is not sterile, your skin needs to be as free of germs as possible.  You can reduce the number of germs on your skin by washing with CHG (chlorahexidine gluconate) soap before surgery.  CHG is an antiseptic cleaner which kills germs and bonds with the skin to continue killing germs even after washing. Please DO NOT use if you have an allergy to CHG or antibacterial soaps.  If your skin becomes reddened/irritated stop using the CHG and inform your nurse when you arrive  at Short Stay. Do not shave (including legs and underarms) for at least 48 hours prior to the first CHG shower.  You may shave your face/neck. Please follow these instructions carefully:  1.  Shower with CHG Soap the night before surgery and the  morning of Surgery.  2.  If you choose to wash your hair, wash your hair first as usual with  your  normal  shampoo.  3.  After you shampoo, rinse your hair and body thoroughly to remove the  shampoo.                           4.  Use CHG as you would any other liquid soap.  You can apply chg directly  to the skin and wash                       Gently with a scrungie or clean washcloth.  5.  Apply the CHG Soap to your body ONLY FROM THE NECK DOWN.   Do not use on face/ open                           Wound or open sores. Avoid contact with eyes, ears mouth and genitals (private parts).                       Wash face,  Genitals (private parts) with your normal soap.             6.  Wash thoroughly, paying special attention to the area where your surgery  will be performed.  7.  Thoroughly rinse your body with warm water from the neck down.  8.  DO NOT shower/wash with your normal soap after using and rinsing off  the CHG Soap.                9.  Pat yourself dry with a clean towel.            10.  Wear clean pajamas.            11.  Place clean sheets on your bed the night of your first shower and do not  sleep with pets. Day of Surgery : Do not apply any lotions/deodorants the morning of surgery.  Please wear clean clothes to the hospital/surgery center.  FAILURE TO FOLLOW THESE INSTRUCTIONS MAY RESULT IN THE CANCELLATION OF YOUR SURGERY PATIENT SIGNATURE_________________________________  NURSE SIGNATURE__________________________________  ________________________________________________________________________   Rogelia MireIncentive Spirometer  An incentive spirometer is a tool that can help keep your lungs clear and active. This tool measures how well you are filling your lungs with each breath. Taking long deep breaths may help reverse or decrease the chance of developing breathing (pulmonary) problems (especially infection) following:  A long period of time when you are unable to move or be active. BEFORE THE PROCEDURE   If the spirometer includes an indicator to show your best effort,  your nurse or respiratory therapist will set it to a desired goal.  If possible, sit up straight or lean slightly forward. Try not to slouch.  Hold the incentive spirometer in an upright position. INSTRUCTIONS FOR USE  1. Sit on the edge of your bed if possible, or sit up as far as you can in bed or on a chair. 2. Hold the incentive spirometer in an upright position. 3. Breathe out normally. 4.  Place the mouthpiece in your mouth and seal your lips tightly around it. 5. Breathe in slowly and as deeply as possible, raising the piston or the ball toward the top of the column. 6. Hold your breath for 3-5 seconds or for as long as possible. Allow the piston or ball to fall to the bottom of the column. 7. Remove the mouthpiece from your mouth and breathe out normally. 8. Rest for a few seconds and repeat Steps 1 through 7 at least 10 times every 1-2 hours when you are awake. Take your time and take a few normal breaths between deep breaths. 9. The spirometer may include an indicator to show your best effort. Use the indicator as a goal to work toward during each repetition. 10. After each set of 10 deep breaths, practice coughing to be sure your lungs are clear. If you have an incision (the cut made at the time of surgery), support your incision when coughing by placing a pillow or rolled up towels firmly against it. Once you are able to get out of bed, walk around indoors and cough well. You may stop using the incentive spirometer when instructed by your caregiver.  RISKS AND COMPLICATIONS  Take your time so you do not get dizzy or light-headed.  If you are in pain, you may need to take or ask for pain medication before doing incentive spirometry. It is harder to take a deep breath if you are having pain. AFTER USE  Rest and breathe slowly and easily.  It can be helpful to keep track of a log of your progress. Your caregiver can provide you with a simple table to help with this. If you are  using the spirometer at home, follow these instructions: SEEK MEDICAL CARE IF:   You are having difficultly using the spirometer.  You have trouble using the spirometer as often as instructed.  Your pain medication is not giving enough relief while using the spirometer.  You develop fever of 100.5 F (38.1 C) or higher. SEEK IMMEDIATE MEDICAL CARE IF:   You cough up bloody sputum that had not been present before.  You develop fever of 102 F (38.9 C) or greater.  You develop worsening pain at or near the incision site. MAKE SURE YOU:   Understand these instructions.  Will watch your condition.  Will get help right away if you are not doing well or get worse. Document Released: 10/14/2006 Document Revised: 08/26/2011 Document Reviewed: 12/15/2006 Exit[employer] Patient Information 2014 ExitCare, [place].   ________________________________________________________________________  WHAT IS A BLOOD TRANSFUSION? Blood Transfusion Information  A transfusion is the replacement of blood or some of its parts. Blood is made up of multiple cells which provide different functions.  Red blood cells carry oxygen and are used for blood loss replacement.  White blood cells fight against infection.  Platelets control bleeding.  Plasma helps clot blood.  Other blood products are available for specialized needs, such as hemophilia or other clotting disorders. BEFORE THE TRANSFUSION  Who gives blood for transfusions?   Healthy volunteers who are fully evaluated to make sure their blood is safe. This is blood bank blood. Transfusion therapy is the safest it has ever been in the practice of medicine. Before blood is taken from a donor, a complete history is taken to make sure that person has no history of diseases nor engages in risky social behavior (examples are intravenous drug use or sexual activity with multiple partners). The donor's travel history is screened  to minimize risk of transmitting  infections, such as malaria. The donated blood is tested for signs of infectious diseases, such as HIV and hepatitis. The blood is then tested to be sure it is compatible with you in order to minimize the chance of a transfusion reaction. If you or a relative donates blood, this is often done in anticipation of surgery and is not appropriate for emergency situations. It takes many days to process the donated blood. RISKS AND COMPLICATIONS Although transfusion therapy is very safe and saves many lives, the main dangers of transfusion include:   Getting an infectious disease.  Developing a transfusion reaction. This is an allergic reaction to something in the blood you were given. Every precaution is taken to prevent this. The decision to have a blood transfusion has been considered carefully by your caregiver before blood is given. Blood is not given unless the benefits outweigh the risks. AFTER THE TRANSFUSION  Right after receiving a blood transfusion, you will usually feel much better and more energetic. This is especially true if your red blood cells have gotten low (anemic). The transfusion raises the level of the red blood cells which carry oxygen, and this usually causes an energy increase.  The nurse administering the transfusion will monitor you carefully for complications. HOME CARE INSTRUCTIONS  No special instructions are needed after a transfusion. You may find your energy is better. Speak with your caregiver about any limitations on activity for underlying diseases you may have. SEEK MEDICAL CARE IF:   Your condition is not improving after your transfusion.  You develop redness or irritation at the intravenous (IV) site. SEEK IMMEDIATE MEDICAL CARE IF:  Any of the following symptoms occur over the next 12 hours:  Shaking chills.  You have a temperature by mouth above 102 F (38.9 C), not controlled by medicine.  Chest, back, or muscle pain.  People around you feel you are  not acting correctly or are confused.  Shortness of breath or difficulty breathing.  Dizziness and fainting.  You get a rash or develop hives.  You have a decrease in urine output.  Your urine turns a dark color or changes to pink, red, or brown. Any of the following symptoms occur over the next 10 days:  You have a temperature by mouth above 102 F (38.9 C), not controlled by medicine.  Shortness of breath.  Weakness after normal activity.  The white part of the eye turns yellow (jaundice).  You have a decrease in the amount of urine or are urinating less often.  Your urine turns a dark color or changes to pink, red, or brown. Document Released: 05/31/2000 Document Revised: 08/26/2011 Document Reviewed: 01/18/2008 [facility] Patient Information 2014 [place], [place].  _______________________________________________________________________

## 2018-11-19 ENCOUNTER — Encounter (HOSPITAL_COMMUNITY): Payer: Self-pay

## 2018-11-19 ENCOUNTER — Encounter (HOSPITAL_COMMUNITY)
Admission: RE | Admit: 2018-11-19 | Discharge: 2018-11-19 | Disposition: A | Discharge status: Home / Self Care | Payer: Medicare Other | Source: Ambulatory Visit | Attending: Orthopedic Surgery | Admitting: Orthopedic Surgery

## 2018-11-19 ENCOUNTER — Other Ambulatory Visit: Payer: Self-pay

## 2018-11-19 DIAGNOSIS — Z01812 Encounter for preprocedural laboratory examination: Secondary | ICD-10-CM | POA: Insufficient documentation

## 2018-11-19 LAB — BASIC METABOLIC PANEL
Anion gap: 6 (ref 5–15)
BUN: 28 mg/dL — ABNORMAL HIGH (ref 8–23)
CO2: 27 mmol/L (ref 22–32)
Calcium: 9.5 mg/dL (ref 8.9–10.3)
Chloride: 110 mmol/L (ref 98–111)
Creatinine, Ser: 1.02 mg/dL (ref 0.61–1.24)
GFR calc Af Amer: >60 mL/min (ref >60)
GFR calc non Af Amer: >60 mL/min (ref >60)
Glucose, Bld: 112 mg/dL — ABNORMAL HIGH (ref 70–99)
Potassium: 4.4 mmol/L (ref 3.5–5.1)
Sodium: 143 mmol/L (ref 135–145)

## 2018-11-19 LAB — CBC
HCT: 35.3 % — ABNORMAL LOW (ref 39.0–52.0)
Hemoglobin: 10.9 g/dL — ABNORMAL LOW (ref 13.0–17.0)
MCH: 31 pg (ref 26.0–34.0)
MCHC: 30.9 g/dL (ref 30.0–36.0)
MCV: 100.3 fL — ABNORMAL HIGH (ref 80.0–100.0)
Platelets: 154 10*3/uL (ref 150–400)
RBC: 3.52 MIL/uL — ABNORMAL LOW (ref 4.22–5.81)
RDW: 14.4 % (ref 11.5–15.5)
WBC: 6.3 10*3/uL (ref 4.0–10.5)
nRBC: 0 % (ref 0.0–0.2)

## 2018-11-19 LAB — SURGICAL PCR SCREEN
MRSA, PCR: NEGATIVE
Staphylococcus aureus: NEGATIVE

## 2018-11-19 NOTE — Progress Notes (Addendum)
Dr.  Sandy Salaam prescribes pt's Eliquis. Pt to contact provider to verify when medication can be held. Pt reported that Dr. Charlann Boxer would like a noted from Dr. Rudolpho Sevin stating that Eliquis can be held at least 3 days prior to surgery. Pt to request this information today.

## 2018-11-23 ENCOUNTER — Other Ambulatory Visit: Payer: Self-pay

## 2018-11-23 ENCOUNTER — Other Ambulatory Visit (HOSPITAL_COMMUNITY)
Admission: RE | Admit: 2018-11-23 | Discharge: 2018-11-23 | Disposition: A | Discharge status: Home / Self Care | Payer: Medicare Other | Source: Ambulatory Visit | Attending: Orthopedic Surgery | Admitting: Orthopedic Surgery

## 2018-11-23 DIAGNOSIS — Z1159 Encounter for screening for other viral diseases: Secondary | ICD-10-CM | POA: Diagnosis present

## 2018-11-23 NOTE — Anesthesia Preprocedure Evaluation (Addendum)
Anesthesia Evaluation  Patient identified by MRN, date of birth, ID band Patient awake    Reviewed: Allergy & Precautions, NPO status , Patient's Chart, lab work & pertinent test results  History of Anesthesia Complications Negative for: history of anesthetic complications  Airway Mallampati: II  TM Distance: >3 FB Neck ROM: Full    Dental  (+) Teeth Intact, Dental Advisory Given   Pulmonary neg pulmonary ROS,    Pulmonary exam normal breath sounds clear to auscultation       Cardiovascular hypertension, Pt. on medications (-) anginaNormal cardiovascular exam+ dysrhythmias Atrial Fibrillation + Valvular Problems/Murmurs MR  Rhythm:Regular Rate:Normal  Echo 10/14/17 Conclusions Summary Normal left ventricular size and systolic function with no appreciable segmental abnormality. Moderate mitral regurgitation. Moderately dilated left atrium. There was no evidence of spontaneous echo contrast or thrombus in the left atrium or left atrial appendage.   Neuro/Psych negative neurological ROS  negative psych ROS   GI/Hepatic Neg liver ROS, GERD  Medicated,  Endo/Other  negative endocrine ROS  Renal/GU negative Renal ROS     Musculoskeletal  (+) Arthritis , Osteoarthritis,    Abdominal   Peds  Hematology  (+) Blood dyscrasia (Eliquis), anemia , Plt 154k on 11/19/2018   Anesthesia Other Findings Day of surgery medications reviewed with the patient.  Reproductive/Obstetrics                          Anesthesia Physical Anesthesia Plan  ASA: III  Anesthesia Plan: General   Post-op Pain Management:    Induction: Intravenous  PONV Risk Score and Plan: 3 and Dexamethasone, Ondansetron and Treatment may vary due to age or medical condition  Airway Management Planned: Oral ETT  Additional Equipment:   Intra-op Plan:   Post-operative Plan: Extubation in OR  Informed Consent: I have reviewed  the patients History and Physical, chart, labs and discussed the procedure including the risks, benefits and alternatives for the proposed anesthesia with the patient or authorized representative who has indicated his/her understanding and acceptance.     Dental advisory given  Plan Discussed with: CRNA  Anesthesia Plan Comments:      Anesthesia Quick Evaluation

## 2018-11-23 NOTE — Progress Notes (Signed)
Anesthesia Chart Review   Case:  [id number] Date/Time:  11/26/18 0700   Procedure:  IRRIGATION AND DEBRIDEMENT HIP (Right ) - 90 mins   Anesthesia type:  Spinal   Pre-op diagnosis:  Right hip periarticular fluid collection   Location:  [place] 09 / [facility] ORS   Surgeon:  Paralee Cancel, MD      DISCUSSION: 84 yo never smoker with h/o GERD, PVC, HLD, nonrheumatic mitral regurgitation, A-fib (s/p ablation, on Eliquis), right hip periarticular fluid collection scheduled for above procedure 11/26/2018 with Dr. Paralee Cancel.   H/o metallosis s/p multiple right THA revisions, last revision 02/04/18 by Dr. Alvan Dame.   Pt last seen by cardiology 11/20/2018.  Seen by Estevan Ryder, PA-C.  Per OV note, "He remains stable at this time with no cardiac symptoms. He does not have a history of coronary artery disease and is not having and chest pain or other anginal symptoms No concern for CHF at this time with TEE on 09/2017 showing normal LV size with no segmental abnormalities, with moderate MR. Low to moderate risk from a cardiac standpoint. No repeat imaging or stress testing needed at this time. He is cleared to have his surgery. He should hold his Eliquis 3 days prior to his procedure and resume once hemostasis is achieved."  Pt can proceed with planned procedure barring acute status change.  VS: BP 105/71   Pulse 66   Temp 36.6 C (Oral)   Resp 16   Ht 5' 8"  (1.727 m)   Wt 66.7 kg   SpO2 99%   BMI 22.35 kg/m   PROVIDERS: Derrill Center., MD is PCP   Mahala Menghini, MD is Cardiologist  LABS: Labs reviewed: Acceptable for surgery. (all labs ordered are listed, but only abnormal results are displayed)  Labs Reviewed  BASIC METABOLIC PANEL - Abnormal; Notable for the following components:      Result Value   Glucose, Bld 112 (*)    BUN 28 (*)    All other components within normal limits  CBC - Abnormal; Notable for the following components:   RBC 3.52 (*)    Hemoglobin 10.9 (*)    HCT 35.3 (*)     MCV 100.3 (*)    All other components within normal limits  SURGICAL PCR SCREEN  TYPE AND SCREEN     IMAGES:   EKG: 07/04/2018 Rate 69 bpm Sinus rhythm Nonspecific intraventricular block Nonspecific T wave changes  CV: Echo 10/14/17 Conclusions Summary Normal left ventricular size and systolic function with no appreciable segmental abnormality. Moderate mitral regurgitation. Moderately dilated left atrium. There was no evidence of spontaneous echo contrast or thrombus in the left atrium or left atrial appendage. I was the attending physician and performed the entire procedure, including insertion of the transesophageal probe. I have reviewed the recent history and physical documentation. I personally spent 30 minutes continuously monitoring the patient during administration of moderate sedation. Pre and post activities have been reviewed. Past Medical History:  Diagnosis Date  . A-fib ([place])    REPORTS WAS DUE TO DEHYDRATION 3 YEARS AGO BUT WAS GIVEN FLUIDS IN ED AND MEDS TO SLOW HR AND DROVE HIMSELF HOME  ; HAD ABLATION X2 WITH CARDIO DR. AKBARY AS PRECAUTION  ; has been on eliquis since but reports he has not taken it for 5 weeks b/c he was told to hold it for the multiple irrigations and debridements for the hip infection. reports his cardiologist is aware he has been holding it;   .  Arthritis   . Atherosclerosis of both carotid arteries   . BPH (benign prostatic hyperplasia)   . Fall    fell on friday 01-16-18;   lost balAnce whIile tryng to get his sock on ;sustained skin tear to left wrist ; no drainage  , scabbed over , and has been covering with bandage   . GERD (gastroesophageal reflux disease)   . Heart murmur    SINCE HIS 20s  . HLD (hyperlipidemia)   . Hypertension   . Non-rheumatic mitral regurgitation   . PVC (premature ventricular contraction)     Past Surgical History:  Procedure Laterality Date  . BACK SURGERY  2007   L1-L5 LUMBAR WITH HARDWARE  PLACED  . CARDIOVERSION    . CATARACT EXTRACTION, BILATERAL  2015  . CRYOABLATION     CARDIAC ABLATION   . DACROCYSTORHINOSTOMY  07/26/2011  . EYE SURGERY    . HERNIA REPAIR Bilateral   . hx of echocardiogram     . INCISION AND DRAINAGE HIP Right 01/26/2018   Procedure: Right hip irrigation and debridement, excisional and non excisional debridement, head liner exchange, posterior approach;  Surgeon: Paralee Cancel, MD;  Location: [facility];  Service: Orthopedics;  Laterality: Right;  90 mins Would like to start earlier around 2:00pm if time opens  . INGUINAL HERNIA REPAIR Bilateral    with mesh   . IR US GUIDE BX ASP/DRAIN  12/17/2017  . IRRIGATION AND ASPIRATION RIGHT HIP   12/10/2017   [place]  x2, [place] x1   . KIDNEY STONE SURGERY     lithotripsy   . KYPHOPLASTY     T7  . LATERAL FUSION LUMBAR SPINE     L1-L5  . PULMONARY VEIN ISOLATION AND LEFT ATRIAL ROOFLINE ABLATION   10/15/2017   DR Minna Merritts   . ROTATOR CUFF REPAIR Left   . SHOULDER DEBRIDEMENT Right   . SHOULDER OPEN ROTATOR CUFF REPAIR Left   . SHOULDER SURGERY  1988   arthroscopy    . TONSILLECTOMY    . TOTAL HIP ARTHROPLASTY     done twice  . TOTAL HIP ARTHROPLASTY  2010  . TOTAL HIP REVISION      05-2017, 10-2016    MEDICATIONS: . apixaban (ELIQUIS) 5 MG TABS tablet  . atorvastatin (LIPITOR) 40 MG tablet  . brimonidine (ALPHAGAN) 0.15 % ophthalmic solution  . COSOPT PF 22.3-6.8 MG/ML SOLN ophthalmic solution  . Eszopiclone 3 MG TABS  . ferrous sulfate 325 (65 FE) MG tablet  . finasteride (PROSCAR) 5 MG tablet  . flecainide (TAMBOCOR) 50 MG tablet  . gabapentin (NEURONTIN) 300 MG capsule  . HYDROcodone-acetaminophen (NORCO) 7.5-325 MG tablet  . LYCOPENE PO  . methocarbamol (ROBAXIN) 500 MG tablet  . multivitamin-lutein (OCUVITE-LUTEIN) CAPS capsule  . NARCAN 4 MG/0.1ML LIQD nasal spray kit  . pantoprazole (PROTONIX) 40 MG tablet  . polyethylene glycol (MIRALAX / GLYCOLAX) packet  . potassium chloride  (K-DUR) 10 MEQ tablet   No current facility-administered medications for this encounter.     Maia Plan [facility] Pre-Surgical Testing [phone] 11/23/18 3:53 PM

## 2018-11-24 LAB — NOVEL CORONAVIRUS, NAA (HOSP ORDER, SEND-OUT TO REF LAB; TAT 18-24 HRS): SARS-CoV-2, NAA: NOT DETECTED

## 2018-11-25 NOTE — Progress Notes (Signed)
SPOKE W/  _patient     SCREENING SYMPTOMS OF COVID 19: completed   COUGH--no  RUNNY NOSE---no   SORE THROAT---no  NASAL CONGESTION---- no SNEEZING----no  SHORTNESS OF BREATH---no  DIFFICULTY BREATHING---no  TEMP >100.0 ---no--  UNEXPLAINED BODY ACHES-----no-  CHILLS ----no----   HEADACHES ------no---  LOSS OF SMELL/ TASTE -----no---    HAVE YOU OR ANY FAMILY MEMBER TRAVELLED PAST 14 DAYS OUT OF THE   COUNTY-no-- STATE--no-- COUNTRY---no-  HAVE YOU OR ANY FAMILY MEMBER BEEN EXPOSED TO ANYONE WITH COVID 19? no    

## 2018-11-25 NOTE — H&P (Signed)
TOTAL HIP IRRIGATION AND DEBRIDEMENT H&P  Patient is admitted for right hip irrigation and debridement, status post right total hip arthroplasty  Subjective:  Chief Complaint: right hip pain  HPI: Travis Wolfe, 84 y.o. 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  Onset of symptoms was gradual starting 1 years ago with gradually worsening course since that time.The patient noted prior procedures of the hip to include arthroplasty with subsequent revision to ceramic on polyethylene liner on the right hip(s).  Patient currently rates pain in the right hip at 5 out of 10 with activity. Patient has pain that interfers with activities of daily living and joint swelling. Patient has evidence of large heterogeneous fluid collection with synovitis and debris about the arthroplastic femoral neck by imaging studies. This condition presents safety issues increasing the risk of falls.There is no current active infection.  Patient Active Problem List   Diagnosis Date Noted   Pain    Fever 02/20/2018   Metallosis    Staphylococcus infection    Infection of right prosthetic hip joint ([place]) 01/26/2018   S/P hip replacement 01/26/2018   Cellulitis of right leg 12/15/2017   PAF (paroxysmal atrial fibrillation) ([place]) 12/15/2017   Abdominal wall pain in right lower quadrant 12/02/2012   Past Medical History:  Diagnosis Date   A-fib ([place])    REPORTS WAS DUE TO DEHYDRATION 3 YEARS AGO BUT WAS GIVEN FLUIDS IN ED AND MEDS TO SLOW HR AND DROVE HIMSELF HOME  ; HAD ABLATION [place] DR. AKBARY AS PRECAUTION  ; has been on eliquis since but reports he has not taken it for 5 weeks b/c he was told to hold it for the multiple irrigations and debridements  for the hip infection. reports his cardiologist is aware he has been holding it;    Arthritis    Atherosclerosis of both carotid arteries    BPH (benign prostatic hyperplasia)    Fall    fell on friday 01-16-18;   lost balAnce whIile tryng to get his sock on ;sustained skin tear to left wrist ; no drainage  , scabbed over , and has been covering with bandage    GERD (gastroesophageal reflux disease)    Heart murmur    SINCE HIS 20s   HLD (hyperlipidemia)    Hypertension    Non-rheumatic mitral regurgitation    PVC (premature ventricular contraction)     Past Surgical History:  Procedure Laterality Date   BACK SURGERY  2007   L1-L5 LUMBAR WITH HARDWARE PLACED   CARDIOVERSION     CATARACT EXTRACTION, BILATERAL  2015   CRYOABLATION     CARDIAC ABLATION    DACROCYSTORHINOSTOMY  07/26/2011   EYE SURGERY     HERNIA REPAIR Bilateral    hx of echocardiogram      INCISION AND DRAINAGE HIP Right 01/26/2018   Procedure: Right hip irrigation and debridement, excisional and non excisional debridement, head liner exchange, posterior approach;  Surgeon: Paralee Cancel, MD;  Location: [facility];  Service: Orthopedics;  Laterality: Right;  90 mins Would like to start earlier around 2:00pm if time opens   INGUINAL HERNIA REPAIR Bilateral    with mesh    IR US GUIDE BX ASP/DRAIN  12/17/2017   IRRIGATION AND ASPIRATION RIGHT HIP   12/10/2017  [place]  x2, [place] x1    KIDNEY STONE SURGERY     lithotripsy    KYPHOPLASTY     T7   LATERAL FUSION LUMBAR SPINE     L1-L5   PULMONARY VEIN ISOLATION AND LEFT ATRIAL ROOFLINE ABLATION   10/15/2017   DR Minna Merritts    ROTATOR CUFF REPAIR Left    SHOULDER DEBRIDEMENT Right    SHOULDER OPEN ROTATOR CUFF REPAIR Left    SHOULDER SURGERY  1988   arthroscopy     TONSILLECTOMY     TOTAL HIP ARTHROPLASTY     done twice   TOTAL HIP ARTHROPLASTY  2010   TOTAL HIP REVISION      05-2017, 10-2016    No current facility-administered  medications for this encounter.    Current Outpatient Medications  Medication Sig Dispense Refill Last Dose   apixaban (ELIQUIS) 5 MG TABS tablet Take 5 mg by mouth 2 (two) times daily.    Taking   atorvastatin (LIPITOR) 40 MG tablet Take 40 mg by mouth daily.  3 Taking   brimonidine (ALPHAGAN) 0.15 % ophthalmic solution Place 1 drop into both eyes at bedtime.      COSOPT PF 22.3-6.8 MG/ML SOLN ophthalmic solution Place 1 drop into both eyes every morning.  3 Taking   Eszopiclone 3 MG TABS Take 3 mg by mouth at bedtime.  1 Taking   ferrous sulfate 325 (65 FE) MG tablet Take 325 mg by mouth daily with breakfast.      finasteride (PROSCAR) 5 MG tablet Take 5 mg by mouth daily.   Taking   flecainide (TAMBOCOR) 50 MG tablet Take 25 mg by mouth 2 (two) times daily.    Taking   gabapentin (NEURONTIN) 300 MG capsule Take 1 capsule by mouth once daily (Patient taking differently: Take 300 mg by mouth daily as needed (neuropathy). ) 30 capsule 0    HYDROcodone-acetaminophen (NORCO) 7.5-325 MG tablet Take 1-2 tablets by mouth every 4 (four) hours as needed for moderate pain. 60 tablet 0 Taking   LYCOPENE PO Take 1 tablet by mouth daily.   Taking   methocarbamol (ROBAXIN) 500 MG tablet Take 1 tablet (500 mg total) by mouth every 6 (six) hours as needed for muscle spasms. 40 tablet 0 Taking   multivitamin-lutein (OCUVITE-LUTEIN) CAPS capsule Take 1 capsule by mouth daily.   Taking   NARCAN 4 MG/0.1ML LIQD nasal spray kit Place 1 spray into the nose once.   0 Taking   pantoprazole (PROTONIX) 40 MG tablet Take 40 mg by mouth daily as needed for heartburn.      potassium chloride (K-DUR) 10 MEQ tablet Take 10 mEq by mouth daily.      polyethylene glycol (MIRALAX / GLYCOLAX) packet Take 17 g by mouth 2 (two) times daily. (Patient taking differently: Take 17 g by mouth daily as needed for mild constipation. ) 14 each 0 Taking   Allergies  Allergen Reactions   Other     Cat dander   Horse  Epithelium Rash    Social History   Tobacco Use   Smoking status: Never Smoker   Smokeless tobacco: Never Used  Substance Use Topics   Alcohol use: Not Currently    Comment: socially     Family History  Problem Relation Age of Onset   Hypertension Mother    Cancer Mother      ROS Constitutional: Constitutional: no fever, chills, night sweats, or significant weight loss. Cardiovascular: Cardiovascular: no palpitations or  chest pain. Respiratory: Respiratory: no cough or shortness of breath and No COPD. Gastrointestinal: Gastrointestinal: no vomiting or nausea. Musculoskeletal: Musculoskeletal: Joint Pain and swelling in Joints. Neurologic: Neurologic: no numbness, tingling, or difficulty with balance.  Objective:  Physical Exam Patient is an 84 year old male.  Well nourished and well developed. General: Alert and oriented x3, cooperative and pleasant, no acute distress. Head: normocephalic, atraumatic, neck supple. Eyes: EOMI. Respiratory: breath sounds clear in all fields, no wheezing, rales, or rhonchi. Cardiovascular: Regular rate and rhythm, no murmurs, gallops or rubs. Abdomen: non-tender to palpation and soft, normoactive bowel sounds. Musculoskeletal:  RIGHT HIP EXAM Inspection: Intact skin, significant swelling in the right hip girdle region anterior lateral, no cellulitis and no rashes Neurovascular: Sensation intact to light touch distally and brisk capillary refill He does not appreciate as much significant swelling in the posterior lateral aspect of his distal thigh  Calves soft and nontender. Motor function intact in LE. Strength 5/5 LE bilaterally. Neuro: Distal pulses 2+. Sensation to light touch intact in LE.  Vital signs in last 24 hours:    Labs:   Estimated body mass index is 22.35 kg/m as calculated from the following:   Height as of 11/19/18: _0  (1.727 m).   Weight as of 11/19/18: 66.7 kg.   Imaging Review Plain radiographs  demonstrate evidence of previous right total hip arthroplasty. The bone quality appears to be adequate for age and reported activity level.      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 The treatment options including medical management, injection therapy, and irrigation and debridement were discussed at length. The risks and benefits of irrigation and debridement were presented and reviewed. The risks due to aseptic loosening, infection, stiffness, dislocation/subluxation,  thromboembolic complications and other imponderables were discussed.  The patient acknowledged the explanation, agreed to proceed with the plan and consent was signed. Patient is being admitted for inpatient treatment for surgery, pain control, PT, OT, prophylactic antibiotics, VTE prophylaxis, progressive ambulation and ADL's and discharge planning.The patient is planning to be discharged home.   Therapy Plans: HEP Disposition: home with wife Planned DVT Prophylaxis: Eliquis (hx of atrial fibrillation) DME needed: none PCP: Dr. Gwenyth Allegra Cardiologist: Dr. Minna Merritts TXA: IV Allergies: NKDA Anesthesia Concerns: none BMI: 22.5  Other: No spinal anesthesia. Hx L1-5 fusion.  - Patient was instructed on what medications to stop prior to surgery. - Follow-up visit in 2 weeks with Dr. Alvan Dame - Begin physical therapy following surgery - Pre-operative lab work as pre-surgical testing - Prescriptions will be provided in hospital at time of discharge  Griffith Citron, PA-C Orthopedic Surgery [facility] [place]

## 2018-11-26 ENCOUNTER — Ambulatory Visit (HOSPITAL_COMMUNITY): Payer: Medicare Other | Admitting: Physician Assistant

## 2018-11-26 ENCOUNTER — Encounter (HOSPITAL_COMMUNITY): Payer: Self-pay | Admitting: *Deleted

## 2018-11-26 ENCOUNTER — Inpatient Hospital Stay (HOSPITAL_COMMUNITY)
Admission: RE | Admit: 2018-11-26 | Discharge: 2018-11-28 | DRG: 481 | Disposition: A | Discharge status: Home / Self Care | Payer: Medicare Other | Attending: Orthopedic Surgery | Admitting: Orthopedic Surgery

## 2018-11-26 ENCOUNTER — Other Ambulatory Visit: Payer: Self-pay

## 2018-11-26 ENCOUNTER — Ambulatory Visit (HOSPITAL_COMMUNITY): Payer: Medicare Other | Admitting: Anesthesiology

## 2018-11-26 ENCOUNTER — Encounter (HOSPITAL_COMMUNITY)
Admission: RE | Disposition: A | Discharge status: Home / Self Care | Payer: Self-pay | Source: Home / Self Care | Attending: Orthopedic Surgery

## 2018-11-26 ENCOUNTER — Ambulatory Visit (HOSPITAL_COMMUNITY): Payer: Medicare Other

## 2018-11-26 DIAGNOSIS — E785 Hyperlipidemia, unspecified: Secondary | ICD-10-CM | POA: Diagnosis present

## 2018-11-26 DIAGNOSIS — T8489XA Other specified complication of internal orthopedic prosthetic devices, implants and grafts, initial encounter: Principal | ICD-10-CM | POA: Diagnosis present

## 2018-11-26 DIAGNOSIS — Y831 Surgical operation with implant of artificial internal device as the cause of abnormal reaction of the patient, or of later complication, without mention of misadventure at the time of the procedure: Secondary | ICD-10-CM | POA: Diagnosis present

## 2018-11-26 DIAGNOSIS — M25451 Effusion, right hip: Secondary | ICD-10-CM | POA: Diagnosis present

## 2018-11-26 DIAGNOSIS — M24651 Ankylosis, right hip: Secondary | ICD-10-CM | POA: Diagnosis present

## 2018-11-26 DIAGNOSIS — Z8249 Family history of ischemic heart disease and other diseases of the circulatory system: Secondary | ICD-10-CM

## 2018-11-26 DIAGNOSIS — Z7901 Long term (current) use of anticoagulants: Secondary | ICD-10-CM

## 2018-11-26 DIAGNOSIS — I480 Paroxysmal atrial fibrillation: Secondary | ICD-10-CM | POA: Diagnosis present

## 2018-11-26 DIAGNOSIS — D62 Acute posthemorrhagic anemia: Secondary | ICD-10-CM | POA: Diagnosis present

## 2018-11-26 DIAGNOSIS — Z96649 Presence of unspecified artificial hip joint: Secondary | ICD-10-CM

## 2018-11-26 DIAGNOSIS — I10 Essential (primary) hypertension: Secondary | ICD-10-CM | POA: Diagnosis present

## 2018-11-26 HISTORY — PX: INCISION AND DRAINAGE HIP: SHX1801

## 2018-11-26 SURGERY — IRRIGATION AND DEBRIDEMENT HIP
Anesthesia: General | Laterality: Right

## 2018-11-26 MED ORDER — FENTANYL CITRATE (PF) 100 MCG/2ML IJ SOLN
INTRAMUSCULAR | Status: AC
  Filled 2018-11-26: qty 2

## 2018-11-26 MED ORDER — FENTANYL CITRATE (PF) 100 MCG/2ML IJ SOLN
INTRAMUSCULAR | Status: DC | PRN
  Administered 2018-11-26 (×2): 50 ug via INTRAVENOUS

## 2018-11-26 MED ORDER — MENTHOL 3 MG MT LOZG: 1.0000 | LOZENGE | OROMUCOSAL | Status: DC | PRN

## 2018-11-26 MED ORDER — ACETAMINOPHEN 325 MG PO TABS
325.0000 mg | ORAL_TABLET | Freq: Four times a day (QID) | ORAL | Status: DC | PRN
  Administered 2018-11-26 – 2018-11-27 (×2): 325 mg via ORAL
  Filled 2018-11-26 (×2): qty 1

## 2018-11-26 MED ORDER — LIDOCAINE 2% (20 MG/ML) 5 ML SYRINGE
INTRAMUSCULAR | Status: AC
  Filled 2018-11-26: qty 5

## 2018-11-26 MED ORDER — FENTANYL CITRATE (PF) 100 MCG/2ML IJ SOLN
25.0000 ug | INTRAMUSCULAR | Status: DC | PRN
  Administered 2018-11-26 (×3): 50 ug via INTRAVENOUS

## 2018-11-26 MED ORDER — CEFAZOLIN SODIUM-DEXTROSE 2-4 GM/100ML-% IV SOLN
2.0000 g | Freq: Four times a day (QID) | INTRAVENOUS | Status: AC
  Administered 2018-11-26 (×2): 2 g via INTRAVENOUS
  Filled 2018-11-26 (×2): qty 100

## 2018-11-26 MED ORDER — ACETAMINOPHEN 500 MG PO TABS
1000.0000 mg | ORAL_TABLET | Freq: Once | ORAL | Status: AC
  Administered 2018-11-26: 1000 mg via ORAL

## 2018-11-26 MED ORDER — FLECAINIDE ACETATE 50 MG PO TABS
25.0000 mg | ORAL_TABLET | Freq: Two times a day (BID) | ORAL | Status: DC
  Administered 2018-11-26 – 2018-11-28 (×4): 25 mg via ORAL
  Filled 2018-11-26 (×5): qty 1

## 2018-11-26 MED ORDER — ONDANSETRON HCL 4 MG/2ML IJ SOLN: 4.0000 mg | Freq: Four times a day (QID) | INTRAMUSCULAR | Status: DC | PRN

## 2018-11-26 MED ORDER — PANTOPRAZOLE SODIUM 40 MG PO TBEC
40.0000 mg | DELAYED_RELEASE_TABLET | Freq: Every day | ORAL | Status: DC | PRN
  Administered 2018-11-26: 40 mg via ORAL
  Filled 2018-11-26: qty 1

## 2018-11-26 MED ORDER — DORZOLAMIDE HCL-TIMOLOL MAL PF 22.3-6.8 MG/ML OP SOLN: 1.0000 [drp] | OPHTHALMIC | Status: DC

## 2018-11-26 MED ORDER — SODIUM CHLORIDE 0.9 % IV SOLN
INTRAVENOUS | Status: DC
  Administered 2018-11-26: 100 mL/h via INTRAVENOUS
  Administered 2018-11-27: 03:00:00 via INTRAVENOUS

## 2018-11-26 MED ORDER — LACTATED RINGERS IV SOLN
INTRAVENOUS | Status: DC
  Administered 2018-11-26 (×2): via INTRAVENOUS

## 2018-11-26 MED ORDER — BRIMONIDINE TARTRATE 0.15 % OP SOLN
1.0000 [drp] | Freq: Every day | OPHTHALMIC | Status: DC
  Administered 2018-11-26 – 2018-11-27 (×2): 1 [drp] via OPHTHALMIC
  Filled 2018-11-26: qty 5

## 2018-11-26 MED ORDER — METHOCARBAMOL 500 MG IVPB - SIMPLE MED
INTRAVENOUS | Status: AC
  Filled 2018-11-26: qty 50

## 2018-11-26 MED ORDER — ONDANSETRON HCL 4 MG/2ML IJ SOLN
INTRAMUSCULAR | Status: DC | PRN
  Administered 2018-11-26: 4 mg via INTRAVENOUS

## 2018-11-26 MED ORDER — ONDANSETRON HCL 4 MG PO TABS: 4.0000 mg | ORAL_TABLET | Freq: Four times a day (QID) | ORAL | Status: DC | PRN

## 2018-11-26 MED ORDER — CELECOXIB 200 MG PO CAPS
200.0000 mg | ORAL_CAPSULE | Freq: Two times a day (BID) | ORAL | Status: DC
  Administered 2018-11-26 – 2018-11-27 (×2): 200 mg via ORAL
  Filled 2018-11-26 (×3): qty 1

## 2018-11-26 MED ORDER — ZOLPIDEM TARTRATE 5 MG PO TABS
5.0000 mg | ORAL_TABLET | Freq: Every evening | ORAL | Status: DC | PRN
  Administered 2018-11-26 – 2018-11-27 (×2): 5 mg via ORAL
  Filled 2018-11-26 (×2): qty 1

## 2018-11-26 MED ORDER — CHLORHEXIDINE GLUCONATE 4 % EX LIQD: 60.0000 mL | Freq: Once | CUTANEOUS | Status: DC

## 2018-11-26 MED ORDER — ONDANSETRON HCL 4 MG/2ML IJ SOLN
INTRAMUSCULAR | Status: AC
  Filled 2018-11-26: qty 2

## 2018-11-26 MED ORDER — MAGNESIUM CITRATE PO SOLN: 1.0000 | Freq: Once | ORAL | Status: DC | PRN

## 2018-11-26 MED ORDER — DOCUSATE SODIUM 100 MG PO CAPS
100.0000 mg | ORAL_CAPSULE | Freq: Two times a day (BID) | ORAL | Status: DC
  Filled 2018-11-26 (×3): qty 1

## 2018-11-26 MED ORDER — HYDROCODONE-ACETAMINOPHEN 7.5-325 MG PO TABS: 1.0000 | ORAL_TABLET | ORAL | Status: DC | PRN

## 2018-11-26 MED ORDER — PHENOL 1.4 % MT LIQD
1.0000 | OROMUCOSAL | Status: DC | PRN
  Administered 2018-11-27: 1 via OROMUCOSAL
  Filled 2018-11-26 (×2): qty 177

## 2018-11-26 MED ORDER — APIXABAN 2.5 MG PO TABS
2.5000 mg | ORAL_TABLET | Freq: Two times a day (BID) | ORAL | Status: DC
  Administered 2018-11-27 – 2018-11-28 (×3): 2.5 mg via ORAL
  Filled 2018-11-26 (×3): qty 1

## 2018-11-26 MED ORDER — CEFAZOLIN SODIUM-DEXTROSE 2-4 GM/100ML-% IV SOLN
2.0000 g | INTRAVENOUS | Status: AC
  Administered 2018-11-26: 2 g via INTRAVENOUS
  Filled 2018-11-26: qty 100

## 2018-11-26 MED ORDER — ATORVASTATIN CALCIUM 40 MG PO TABS
40.0000 mg | ORAL_TABLET | Freq: Every day | ORAL | Status: DC
  Administered 2018-11-26 – 2018-11-27 (×2): 40 mg via ORAL
  Filled 2018-11-26 (×3): qty 1

## 2018-11-26 MED ORDER — POTASSIUM CHLORIDE CRYS ER 10 MEQ PO TBCR
10.0000 meq | EXTENDED_RELEASE_TABLET | Freq: Every day | ORAL | Status: DC
  Administered 2018-11-26 – 2018-11-27 (×2): 10 meq via ORAL
  Filled 2018-11-26 (×3): qty 1

## 2018-11-26 MED ORDER — TRANEXAMIC ACID-NACL 1000-0.7 MG/100ML-% IV SOLN
1000.0000 mg | INTRAVENOUS | Status: AC
  Administered 2018-11-26: 1000 mg via INTRAVENOUS
  Filled 2018-11-26: qty 100

## 2018-11-26 MED ORDER — ROCURONIUM BROMIDE 10 MG/ML (PF) SYRINGE
PREFILLED_SYRINGE | INTRAVENOUS | Status: AC
  Filled 2018-11-26: qty 10

## 2018-11-26 MED ORDER — DEXAMETHASONE SODIUM PHOSPHATE 10 MG/ML IJ SOLN
10.0000 mg | Freq: Once | INTRAMUSCULAR | Status: AC
  Administered 2018-11-27: 10 mg via INTRAVENOUS
  Filled 2018-11-26: qty 1

## 2018-11-26 MED ORDER — ACETAMINOPHEN 500 MG PO TABS
ORAL_TABLET | ORAL | Status: AC
  Filled 2018-11-26: qty 2

## 2018-11-26 MED ORDER — SODIUM CHLORIDE 0.9 % IV BOLUS
250.0000 mL | Freq: Once | INTRAVENOUS | Status: AC
  Administered 2018-11-26: 250 mL via INTRAVENOUS

## 2018-11-26 MED ORDER — HYDROMORPHONE HCL 1 MG/ML IJ SOLN: 0.5000 mg | INTRAMUSCULAR | Status: DC | PRN

## 2018-11-26 MED ORDER — HYDROCODONE-ACETAMINOPHEN 5-325 MG PO TABS
1.0000 | ORAL_TABLET | ORAL | Status: DC | PRN
  Administered 2018-11-26 (×2): 2 via ORAL
  Administered 2018-11-27: 1 via ORAL
  Filled 2018-11-26: qty 2
  Filled 2018-11-26: qty 1
  Filled 2018-11-26: qty 2

## 2018-11-26 MED ORDER — GABAPENTIN 300 MG PO CAPS: 300.0000 mg | ORAL_CAPSULE | Freq: Every day | ORAL | Status: DC | PRN

## 2018-11-26 MED ORDER — METOCLOPRAMIDE HCL 5 MG PO TABS: 5.0000 mg | ORAL_TABLET | Freq: Three times a day (TID) | ORAL | Status: DC | PRN

## 2018-11-26 MED ORDER — DORZOLAMIDE HCL-TIMOLOL MAL 2-0.5 % OP SOLN
1.0000 [drp] | Freq: Two times a day (BID) | OPHTHALMIC | Status: DC
  Administered 2018-11-26 – 2018-11-27 (×2): 1 [drp] via OPHTHALMIC
  Filled 2018-11-26: qty 10

## 2018-11-26 MED ORDER — SUGAMMADEX SODIUM 200 MG/2ML IV SOLN
INTRAVENOUS | Status: DC | PRN
  Administered 2018-11-26: 140 mg via INTRAVENOUS

## 2018-11-26 MED ORDER — LIDOCAINE 2% (20 MG/ML) 5 ML SYRINGE
INTRAMUSCULAR | Status: DC | PRN
  Administered 2018-11-26: 80 mg via INTRAVENOUS

## 2018-11-26 MED ORDER — PROPOFOL 10 MG/ML IV BOLUS
INTRAVENOUS | Status: DC | PRN
  Administered 2018-11-26: 100 mg via INTRAVENOUS

## 2018-11-26 MED ORDER — METHOCARBAMOL 500 MG IVPB - SIMPLE MED
500.0000 mg | Freq: Four times a day (QID) | INTRAVENOUS | Status: DC | PRN
  Administered 2018-11-26: 500 mg via INTRAVENOUS
  Filled 2018-11-26: qty 50

## 2018-11-26 MED ORDER — FERROUS SULFATE 325 (65 FE) MG PO TABS
325.0000 mg | ORAL_TABLET | Freq: Three times a day (TID) | ORAL | Status: DC
  Administered 2018-11-26 – 2018-11-28 (×4): 325 mg via ORAL
  Filled 2018-11-26 (×4): qty 1

## 2018-11-26 MED ORDER — SUGAMMADEX SODIUM 200 MG/2ML IV SOLN
INTRAVENOUS | Status: AC
  Filled 2018-11-26: qty 2

## 2018-11-26 MED ORDER — METHOCARBAMOL 500 MG PO TABS: 500.0000 mg | ORAL_TABLET | Freq: Four times a day (QID) | ORAL | Status: DC | PRN

## 2018-11-26 MED ORDER — BISACODYL 10 MG RE SUPP: 10.0000 mg | Freq: Every day | RECTAL | Status: DC | PRN

## 2018-11-26 MED ORDER — ALUM & MAG HYDROXIDE-SIMETH 200-200-20 MG/5ML PO SUSP: 15.0000 mL | ORAL | Status: DC | PRN

## 2018-11-26 MED ORDER — TRANEXAMIC ACID-NACL 1000-0.7 MG/100ML-% IV SOLN
1000.0000 mg | Freq: Once | INTRAVENOUS | Status: AC
  Administered 2018-11-26: 1000 mg via INTRAVENOUS
  Filled 2018-11-26: qty 100

## 2018-11-26 MED ORDER — ROCURONIUM BROMIDE 10 MG/ML (PF) SYRINGE
PREFILLED_SYRINGE | INTRAVENOUS | Status: DC | PRN
  Administered 2018-11-26: 50 mg via INTRAVENOUS

## 2018-11-26 MED ORDER — POLYETHYLENE GLYCOL 3350 17 G PO PACK
17.0000 g | PACK | Freq: Two times a day (BID) | ORAL | Status: DC
  Filled 2018-11-26 (×3): qty 1

## 2018-11-26 MED ORDER — PROPOFOL 10 MG/ML IV BOLUS
INTRAVENOUS | Status: AC
  Filled 2018-11-26: qty 20

## 2018-11-26 MED ORDER — DEXAMETHASONE SODIUM PHOSPHATE 10 MG/ML IJ SOLN
10.0000 mg | Freq: Once | INTRAMUSCULAR | Status: AC
  Administered 2018-11-26: 8 mg via INTRAVENOUS

## 2018-11-26 MED ORDER — FENTANYL CITRATE (PF) 100 MCG/2ML IJ SOLN
INTRAMUSCULAR | Status: AC
  Administered 2018-11-26: 50 ug via INTRAVENOUS
  Filled 2018-11-26: qty 2

## 2018-11-26 MED ORDER — DEXAMETHASONE SODIUM PHOSPHATE 10 MG/ML IJ SOLN
INTRAMUSCULAR | Status: AC
  Filled 2018-11-26: qty 1

## 2018-11-26 MED ORDER — DIPHENHYDRAMINE HCL 12.5 MG/5ML PO ELIX: 12.5000 mg | ORAL_SOLUTION | ORAL | Status: DC | PRN

## 2018-11-26 MED ORDER — ONDANSETRON HCL 4 MG/2ML IJ SOLN
4.0000 mg | Freq: Once | INTRAMUSCULAR | Status: AC | PRN
  Administered 2018-11-26: 4 mg via INTRAVENOUS

## 2018-11-26 MED ORDER — METOCLOPRAMIDE HCL 5 MG/ML IJ SOLN
5.0000 mg | Freq: Three times a day (TID) | INTRAMUSCULAR | Status: DC | PRN
  Administered 2018-11-26: 10 mg via INTRAVENOUS
  Filled 2018-11-26: qty 2

## 2018-11-26 SURGICAL SUPPLY — 47 items
BAG ZIPLOCK 12X15 (MISCELLANEOUS) ×3 IMPLANT
BLADE SAW SGTL 11.0X1.19X90.0M (BLADE) IMPLANT
COVER SURGICAL LIGHT HANDLE (MISCELLANEOUS) ×3 IMPLANT
COVER WAND RF STERILE (DRAPES) IMPLANT
DERMABOND ADVANCED (GAUZE/BANDAGES/DRESSINGS) ×2
DERMABOND ADVANCED .7 DNX12 (GAUZE/BANDAGES/DRESSINGS) ×1 IMPLANT
DRAPE ORTHO SPLIT 77X108 STRL (DRAPES) ×4
DRAPE SURG 17X11 SM STRL (DRAPES) ×3 IMPLANT
DRAPE SURG ORHT 6 SPLT 77X108 (DRAPES) ×2 IMPLANT
DRAPE U-SHAPE 47X51 STRL (DRAPES) ×3 IMPLANT
DRSG AQUACEL AG ADV 3.5X 6 (GAUZE/BANDAGES/DRESSINGS) ×3 IMPLANT
DRSG AQUACEL AG ADV 3.5X10 (GAUZE/BANDAGES/DRESSINGS) ×3 IMPLANT
DURAPREP 26ML APPLICATOR (WOUND CARE) ×3 IMPLANT
ELECT BLADE TIP CTD 4 INCH (ELECTRODE) ×3 IMPLANT
ELECT REM PT RETURN 15FT ADLT (MISCELLANEOUS) ×3 IMPLANT
EVACUATOR 1/8 PVC DRAIN (DRAIN) IMPLANT
GAUZE SPONGE 2X2 8PLY STRL LF (GAUZE/BANDAGES/DRESSINGS) ×1 IMPLANT
GLOVE BIOGEL M 7.0 STRL (GLOVE) IMPLANT
GLOVE BIOGEL PI IND STRL 7.5 (GLOVE) ×1 IMPLANT
GLOVE BIOGEL PI IND STRL 8.5 (GLOVE) ×1 IMPLANT
GLOVE BIOGEL PI INDICATOR 7.5 (GLOVE) ×2
GLOVE BIOGEL PI INDICATOR 8.5 (GLOVE) ×2
GLOVE ECLIPSE 8.0 STRL XLNG CF (GLOVE) IMPLANT
GLOVE ORTHO TXT STRL SZ7.5 (GLOVE) ×6 IMPLANT
GLOVE SURG ORTHO 8.0 STRL STRW (GLOVE) ×3 IMPLANT
GOWN STRL REUS W/TWL LRG LVL3 (GOWN DISPOSABLE) ×3 IMPLANT
GOWN STRL REUS W/TWL XL LVL3 (GOWN DISPOSABLE) ×6 IMPLANT
HANDPIECE INTERPULSE COAX TIP (DISPOSABLE) ×2
IV NS IRRIG 3000ML ARTHROMATIC (IV SOLUTION) ×6 IMPLANT
JET LAVAGE IRRISEPT WOUND (IRRIGATION / IRRIGATOR) ×3
KIT BASIN OR (CUSTOM PROCEDURE TRAY) ×3 IMPLANT
KIT TURNOVER KIT A (KITS) IMPLANT
LAVAGE JET IRRISEPT WOUND (IRRIGATION / IRRIGATOR) ×1 IMPLANT
MANIFOLD NEPTUNE II (INSTRUMENTS) ×12 IMPLANT
PACK TOTAL JOINT (CUSTOM PROCEDURE TRAY) ×3 IMPLANT
PROTECTOR NERVE ULNAR (MISCELLANEOUS) ×6 IMPLANT
SET HNDPC FAN SPRY TIP SCT (DISPOSABLE) ×1 IMPLANT
SPONGE GAUZE 2X2 STER 10/PKG (GAUZE/BANDAGES/DRESSINGS) ×2
SPONGE LAP 18X18 RF (DISPOSABLE) ×12 IMPLANT
STAPLER VISISTAT 35W (STAPLE) IMPLANT
SUT MNCRL AB 4-0 PS2 18 (SUTURE) ×3 IMPLANT
SUT VIC AB 1 CT1 36 (SUTURE) ×6 IMPLANT
SUT VIC AB 2-0 CT1 27 (SUTURE) ×4
SUT VIC AB 2-0 CT1 TAPERPNT 27 (SUTURE) ×2 IMPLANT
SWAB COLLECTION DEVICE MRSA (MISCELLANEOUS) IMPLANT
SWAB CULTURE ESWAB REG 1ML (MISCELLANEOUS) ×3 IMPLANT
TOWEL OR 17X26 10 PK STRL BLUE (TOWEL DISPOSABLE) ×6 IMPLANT

## 2018-11-26 NOTE — Anesthesia Procedure Notes (Signed)
Procedure Name: Intubation Date/Time: 11/26/2018 7:25 AM Performed by: Victoriano Lain, CRNA Pre-anesthesia Checklist: Patient identified, Emergency Drugs available, Suction available, Patient being monitored and Timeout performed Patient Re-evaluated:Patient Re-evaluated prior to induction Oxygen Delivery Method: Circle system utilized Preoxygenation: Pre-oxygenation with 100% oxygen Induction Type: IV induction Ventilation: Mask ventilation without difficulty Laryngoscope Size: Mac and 4 Grade View: Grade I Tube type: Oral Tube size: 7.5 mm Number of attempts: 1 Airway Equipment and Method: Stylet Placement Confirmation: ETT inserted through vocal cords under direct vision,  positive ETCO2 and breath sounds checked- equal and bilateral Secured at: 21 cm Tube secured with: Tape Dental Injury: Teeth and Oropharynx as per pre-operative assessment

## 2018-11-26 NOTE — Anesthesia Postprocedure Evaluation (Signed)
Anesthesia Post Note  Patient: Travis Wolfe  Procedure(s) Performed: IRRIGATION AND DEBRIDEMENT HIP (Right )     Patient location during evaluation: PACU Anesthesia Type: General Level of consciousness: awake and alert, awake and oriented Pain management: pain level controlled Vital Signs Assessment: post-procedure vital signs reviewed and stable Respiratory status: spontaneous breathing, nonlabored ventilation and respiratory function stable Cardiovascular status: blood pressure returned to baseline and stable Postop Assessment: no apparent nausea or vomiting Anesthetic complications: no    Last Vitals:  Vitals:   11/26/18 1108 11/26/18 1208  BP: (!) 109/54 (!) 106/54  Pulse:  64  Resp: 15 14  Temp:  36.5 C  SpO2: 95% 100%    Last Pain:  Vitals:   11/26/18 1200  TempSrc:   PainSc: 7                  Catalina Gravel

## 2018-11-26 NOTE — Evaluation (Signed)
Physical Therapy Evaluation Patient Details Name: Travis Wolfe MRN: [id number] DOB: 10/22/1934 Today's Date: 11/26/2018   History of Present Illness  84 yo male s/p I &D of R THA, posterior incision utilized, on 11/26/18. PMH includes R THA with history of infection and I&D, PAF, falls, HLD, HTN, lumbar fusion, kyphoplasty, L RTC repair.  Clinical Impression   Pt presents with R hip pain, difficulty performing bed mobility and transfer, R hip post-operative weakness, orthostatic hypotension, and decreased activity tolerance. Pt to benefit from acute PT to address deficits. Pt stood EOB, limited by significant dizziness. See vitals below. PT and pt utilized posterior hip precautions on eval, precautions not listed in orders or op note and MD contacted to clarify orders. Pt educated on ankle pumps (20/hour) to perform this afternoon/evening to increase circulation, to pt's tolerance and limited by pain. PT to progress mobility as tolerated, and will continue to follow acutely.   BP and HR: - supine: 111/59, 78  - sitting: 96,45, 85 - standing: 64/50, 87 (symptomatic dizziness, returned to supine)       Follow Up Recommendations Follow surgeon's recommendation for DC plan and follow-up therapies;Supervision for mobility/OOB(HEP)    Equipment Recommendations  None recommended by PT    Recommendations for Other Services       Precautions / Restrictions Precautions Precautions: Fall Precaution Comments: unsure if pt is to use posterior hip precautions, messaged Dr. Alvan Dame via secure chat to determine precautions because none listed in orders. PT and pt utilized conservative approach on eval and maintained posterior precautions. Restrictions Weight Bearing Restrictions: Yes RLE Weight Bearing: Weight bearing as tolerated      Mobility  Bed Mobility Overal bed mobility: Needs Assistance Bed Mobility: Supine to Sit;Sit to Supine     Supine to sit: Min assist;HOB elevated Sit to  supine: Min assist;HOB elevated   General bed mobility comments: Min assist for RLE management, trunk elevation and lowering, positioning in bed with use of boost function.  Transfers Overall transfer level: Needs assistance Equipment used: Rolling walker (2 wheeled) Transfers: Sit to/from Stand Sit to Stand: Min assist;From elevated surface         General transfer comment: min assist for power up, steadying upon standing. Pt reporting significant dizziness upon standing, pt took 2 steps to head of bed and returned to supine with PT assist. See assessment for vitals.  Ambulation/Gait Ambulation/Gait assistance: (NT - orthostasis)              Stairs            Wheelchair Mobility    Modified Rankin (Stroke Patients Only)       Balance Overall balance assessment: Needs assistance;History of Falls Sitting-balance support: No upper extremity supported;Feet supported Sitting balance-Leahy Scale: Fair     Standing balance support: Bilateral upper extremity supported Standing balance-Leahy Scale: Poor                               Pertinent Vitals/Pain Pain Assessment: 0-10 Pain Score: 4  Pain Location: R hip Pain Descriptors / Indicators: Sore Pain Intervention(s): Repositioned;Premedicated before session;Monitored during session;Limited activity within patient's tolerance    Home Living Family/patient expects to be discharged to:: Private residence Living Arrangements: Spouse/significant other Available Help at Discharge: Family;Available 24 hours/day Type of Home: House Home Access: Stairs to enter Entrance Stairs-Rails: Right Entrance Stairs-Number of Steps: 3 Home Layout: Multi-level;Able to live on main level with bedroom/bathroom  Home Equipment: Walker - 2 wheels;Grab bars - tub/shower;Shower seat;Cane - single point;Wheelchair - manual      Prior Function Level of Independence: Independent with assistive device(s)          Comments: Pt reports using RW PTA     Hand Dominance   Dominant Hand: Right    Extremity/Trunk Assessment   Upper Extremity Assessment Upper Extremity Assessment: Overall WFL for tasks assessed    Lower Extremity Assessment Lower Extremity Assessment: Generalized weakness;RLE deficits/detail RLE Deficits / Details: suspected post-surgical R hip weakness; able to perform ankle pumps, quad set, heel slide to 45* knee flexion RLE Sensation: WNL    Cervical / Trunk Assessment Cervical / Trunk Assessment: Normal  Communication   Communication: No difficulties  Cognition Arousal/Alertness: Awake/alert Behavior During Therapy: WFL for tasks assessed/performed Overall Cognitive Status: Within Functional Limits for tasks assessed                                        General Comments      Exercises     Assessment/Plan    PT Assessment Patient needs continued PT services  PT Problem List Decreased strength;Decreased mobility;Decreased activity tolerance;Decreased balance;Decreased knowledge of use of DME;Pain       PT Treatment Interventions DME instruction;Therapeutic activities;Gait training;Therapeutic exercise;Patient/family education;Balance training;Stair training;Functional mobility training    PT Goals (Current goals can be found in the Care Plan section)  Acute Rehab PT Goals Patient Stated Goal: none stated PT Goal Formulation: With patient Time For Goal Achievement: 12/03/18 Potential to Achieve Goals: Good    Frequency Min 5X/week   Barriers to discharge        Co-evaluation               AM-PAC PT "6 Clicks" Mobility  Outcome Measure Help needed turning from your back to your side while in a flat bed without using bedrails?: A Little Help needed moving from lying on your back to sitting on the side of a flat bed without using bedrails?: A Little Help needed moving to and from a bed to a chair (including a wheelchair)?: A  Little Help needed standing up from a chair using your arms (e.g., wheelchair or bedside chair)?: A Little Help needed to walk in hospital room?: A Little Help needed climbing 3-5 steps with a railing? : A Lot 6 Click Score: 17    End of Session Equipment Utilized During Treatment: Gait belt Activity Tolerance: Patient tolerated treatment well Patient left: in bed;with bed alarm set;with call bell/phone within reach;with SCD's reapplied Nurse Communication: Mobility status PT Visit Diagnosis: Other abnormalities of gait and mobility (R26.89);Difficulty in walking, not elsewhere classified (R26.2)    Time: [id number] PT Time Calculation (min) (ACUTE ONLY): 32 min   Charges:   PT Evaluation $PT Eval Low Complexity: 1 Low PT Treatments $Therapeutic Activity: 8-22 mins       Nicola PoliceAlexa D Wateen Varon, PT Acute Rehabilitation Services Pager [phone]  Office [phone]  Tyrone AppleAlexa D Despina Hiddenure 11/26/2018, 6:48 PM

## 2018-11-26 NOTE — Brief Op Note (Signed)
11/26/2018  8:44 AM  PATIENT:  Travis Wolfe  84 y.o. male  PRE-OPERATIVE DIAGNOSIS:  Right hip periarticular fluid collection  POST-OPERATIVE DIAGNOSIS:  Right hip periarticular fluid collection secondary to on going metallosis  PROCEDURE:  Procedure(s) with comments: IRRIGATION AND DEBRIDEMENT HIP (Right) - 90 mins  SURGEON:  Surgeon(s) and Role:    Paralee Cancel, MD - Primary  PHYSICIAN ASSISTANT: Griffith Citron, PA-C  ANESTHESIA:   general  EBL:  700 mL   BLOOD ADMINISTERED:none  DRAINS: none   LOCAL MEDICATIONS USED:  NONE  SPECIMEN:  No Specimen  DISPOSITION OF SPECIMEN:  N/A  COUNTS:  YES  TOURNIQUET:  * No tourniquets in log *  DICTATION: .Other Dictation: Dictation Number [id number]  PLAN OF CARE: Admit to inpatient   PATIENT DISPOSITION:  PACU - hemodynamically stable.   Delay start of Pharmacological VTE agent (>24hrs) due to surgical blood loss or risk of bleeding: no

## 2018-11-26 NOTE — Op Note (Signed)
NAME: DEACON, GADBOIS MEDICAL RECORD EZ:[id number] ACCOUNT [id number] DATE OF BIRTH:June 08, 1935 FACILITY: [facility] LOCATION: [facility] PHYSICIAN:Madai Nuccio D. Shannin Naab, MD  OPERATIVE REPORT  DATE OF PROCEDURE:  11/26/2018  PREOPERATIVE DIAGNOSIS:  History of right total hip replacement complicated by metalosis now with persistent and recurring large periarticular swelling in the hip secondary to metalosis.    POSTOPERATIVE DIAGNOSIS:  History of right total hip replacement complicated by metalosis now with persistent and recurring large periarticular swelling in the hip secondary to metalosis.    PROCEDURE:  Excisional and nonexcisional debridement of right hip.  This included sharp excisional debridement with scalpel and Bovie of a 10 inch incision, the underlying nonviable scar tissue, synovium and muscle.  Evacuation of a very large hematoma  of the right hip joint and debridement of nonviable tissue secondary to metalosis findings.  Followed by nonexcisional debridement with first 3 liters normal saline solution followed by 400 mL of Irrisept, chlorhexidine solution followed by another 3  liters normal saline solution prior to closure.  SURGEON:  Paralee Cancel, MD  ASSISTANT:  Griffith Citron, PA-C.  Note that Ms. Nehemiah Settle was present for the entirety of the case from preoperative positioning, perioperative management of the operative extremity, general facilitation of the case and primary wound closure.  ANESTHESIA:  General.  BLOOD LOSS:  700 mL  DRAINS:  None.  COMPLICATIONS:  None apparent.  INDICATIONS:  The patient is a pleasant 84 year old gentleman with a history of right index total hip arthroplasty that has been subsequently complicated by metalosis and subsequent significant periarticular fluid collections.  I had met him first in the  hospital as a consult and subsequently have taken him to the operating room for head and liner exchange and initial excisional and nonexcisional  debridement that included all the way to the distal thigh.  He has been seen in the office recently with  recurrent swelling of the lateral hip.  Based on our discussion, the plan was to repeat I and D as opposed to perform any significant and potentially morbid excision of his implants.  Consent was obtained for benefit of pain relief and evacuation of his  fluid.  DESCRIPTION OF PROCEDURE:  The patient was brought to the operative theater.  Once adequate anesthesia, preoperative antibiotics, Ancef administered, he was positioned into the left lateral decubitus position with the right hip up.  The right lower  extremity was then prepped and draped in a sterile fashion.  A timeout was performed identifying the patient, the planned procedure and extremity.  His old incisions had been identified.  I utilized the posterior, but anterior most incision of this.  It  was demarcated on the skin.  I excised the skin before getting into the subfascial layer.  He was noted to have a significant amount of swelling.  Once I had the fascial layer exposed, I did incise this area and evacuate an extremely large amount of old  blood, hematoma, metalosis type fluid.  We evacuated this out to open up the incision from the iliotibial band to the gluteal fascia, which was directly exposed down to the joint.  Upon evacuation of this old blood and fluid collection, we used 3 liters  normal saline solution to irrigate the wound out.  I did a lot of digital palpation in the anterior aspect of the joint where he had some swelling as well as inferiorly.  We also identified in the anterior aspect of the thigh underneath the iliotibial  band another region of old  blood.  Once I irrigated the wound and was able to better visualize what was going on, I used a rongeur and a Bovie and we basically performed a synovectomy throughout the entire extent of the exposed area.  There was noted to  be some involvement of the muscle, which was  debrided.  There was no evidence of any muscle to be repaired.  There was no significant change from the last time I was in his hip as it pertains to the limited bone around his acetabulum.  Following this  debridement including the areas of the iliopsoas tendon insertion and into the pelvis as well as anteriorly, I then elected to place Irresept, chlorhexidine gluconate fluid into the hip.  We let this sit for 4 minutes.  It was then evacuated.  Then, I  reirrigated the hip with another 3 liters normal saline solution prior to closure.  Once this was carried out, I reapproximated the iliotibial band using a combination of #1 Vicryl and #1 Stratafix suture.  The remainder of the wound was closed with 2-0  Vicryl and a running Monocryl stitch.  The hip was then cleaned, dried and dressed sterilely using surgical glue and Aquacel dressing.  He was then brought to the recovery room in stable condition, tolerating the procedure well.  Findings were reviewed  with his wife.  He will remain in the hospital for 1-2 days to allow for mobilization prior to discharge home safely.  TN/[place]ANCE  D:11/26/2018 T:11/26/2018 JOB:[id number]

## 2018-11-26 NOTE — Interval H&P Note (Signed)
History and Physical Interval Note:  11/26/2018 7:13 AM  Travis Wolfe  has presented today for surgery, with the diagnosis of Right hip periarticular fluid collection.  The various methods of treatment have been discussed with the patient and family. After consideration of risks, benefits and other options for treatment, the patient has consented to  Procedure(s) with comments: IRRIGATION AND DEBRIDEMENT HIP (Right) - 90 mins as a surgical intervention.  The patient's history has been reviewed, patient examined, no change in status, stable for surgery.  I have reviewed the patient's chart and labs.  Questions were answered to the patient's satisfaction.     Mauri Pole

## 2018-11-26 NOTE — Transfer of Care (Signed)
Immediate Anesthesia Transfer of Care Note  Patient: Travis Wolfe  Procedure(s) Performed: IRRIGATION AND DEBRIDEMENT HIP (Right )  Patient Location: PACU  Anesthesia Type:General  Level of Consciousness: awake, alert , oriented and patient cooperative  Airway & Oxygen Therapy: Patient Spontanous Breathing and Patient connected to face mask oxygen  Post-op Assessment: Report given to RN, Post -op Vital signs reviewed and stable and Patient moving all extremities  Post vital signs: Reviewed and stable  Last Vitals:  Vitals Value Taken Time  BP 116/55 11/26/18 0921  Temp    Pulse 65 11/26/18 0923  Resp 14 11/26/18 0923  SpO2 100 % 11/26/18 0923  Vitals shown include unvalidated device data.  Last Pain:  Vitals:   11/26/18 0631  TempSrc: Oral         Complications: No apparent anesthesia complications

## 2018-11-27 ENCOUNTER — Encounter (HOSPITAL_COMMUNITY): Payer: Self-pay | Admitting: Orthopedic Surgery

## 2018-11-27 DIAGNOSIS — Z8249 Family history of ischemic heart disease and other diseases of the circulatory system: Secondary | ICD-10-CM | POA: Diagnosis not present

## 2018-11-27 DIAGNOSIS — I480 Paroxysmal atrial fibrillation: Secondary | ICD-10-CM | POA: Diagnosis present

## 2018-11-27 DIAGNOSIS — Z7901 Long term (current) use of anticoagulants: Secondary | ICD-10-CM | POA: Diagnosis not present

## 2018-11-27 DIAGNOSIS — E785 Hyperlipidemia, unspecified: Secondary | ICD-10-CM | POA: Diagnosis present

## 2018-11-27 DIAGNOSIS — I10 Essential (primary) hypertension: Secondary | ICD-10-CM | POA: Diagnosis present

## 2018-11-27 DIAGNOSIS — D62 Acute posthemorrhagic anemia: Secondary | ICD-10-CM | POA: Diagnosis present

## 2018-11-27 DIAGNOSIS — M25451 Effusion, right hip: Secondary | ICD-10-CM | POA: Diagnosis present

## 2018-11-27 DIAGNOSIS — Y831 Surgical operation with implant of artificial internal device as the cause of abnormal reaction of the patient, or of later complication, without mention of misadventure at the time of the procedure: Secondary | ICD-10-CM | POA: Diagnosis present

## 2018-11-27 DIAGNOSIS — M24651 Ankylosis, right hip: Secondary | ICD-10-CM | POA: Diagnosis present

## 2018-11-27 DIAGNOSIS — T8489XA Other specified complication of internal orthopedic prosthetic devices, implants and grafts, initial encounter: Secondary | ICD-10-CM | POA: Diagnosis present

## 2018-11-27 LAB — BASIC METABOLIC PANEL
Anion gap: 9 (ref 5–15)
BUN: 29 mg/dL — ABNORMAL HIGH (ref 8–23)
CO2: 19 mmol/L — ABNORMAL LOW (ref 22–32)
Calcium: 7.6 mg/dL — ABNORMAL LOW (ref 8.9–10.3)
Chloride: 109 mmol/L (ref 98–111)
Creatinine, Ser: 1.01 mg/dL (ref 0.61–1.24)
GFR calc Af Amer: >60 mL/min (ref >60)
GFR calc non Af Amer: >60 mL/min (ref >60)
Glucose, Bld: 123 mg/dL — ABNORMAL HIGH (ref 70–99)
Potassium: 4.2 mmol/L (ref 3.5–5.1)
Sodium: 137 mmol/L (ref 135–145)

## 2018-11-27 LAB — CBC
HCT: 17.9 % — ABNORMAL LOW (ref 39.0–52.0)
Hemoglobin: 5.5 g/dL — CL (ref 13.0–17.0)
MCH: 30.9 pg (ref 26.0–34.0)
MCHC: 30.7 g/dL (ref 30.0–36.0)
MCV: 100.6 fL — ABNORMAL HIGH (ref 80.0–100.0)
Platelets: 101 10*3/uL — ABNORMAL LOW (ref 150–400)
RBC: 1.78 MIL/uL — ABNORMAL LOW (ref 4.22–5.81)
RDW: 14.4 % (ref 11.5–15.5)
WBC: 8.5 10*3/uL (ref 4.0–10.5)
nRBC: 0 % (ref 0.0–0.2)

## 2018-11-27 LAB — PREPARE RBC (CROSSMATCH)

## 2018-11-27 MED ORDER — SODIUM CHLORIDE 0.9% IV SOLUTION
Freq: Once | INTRAVENOUS | Status: AC
  Administered 2018-11-27: 09:00:00 via INTRAVENOUS

## 2018-11-27 MED ORDER — TRANEXAMIC ACID 650 MG PO TABS (ORTHO)
1950.0000 mg | ORAL_TABLET | Freq: Once | ORAL | Status: AC
  Administered 2018-11-27: 1950 mg via ORAL
  Filled 2018-11-27: qty 3

## 2018-11-27 NOTE — Progress Notes (Signed)
Called the stat labs number to request a STAT lab draw. The phone kept ringing and ringing (no answer). Called the regular lab. Asked the phlebotomist to perform the repeat lab draw in the arm that does not have an IV in it (as requested by Dr. Rod Can). Phlebotomist stated she does not remember what arm she originally did the lab draw in (when the Hgb result came out to 5.5). Waiting for lab to do the STAT blood draw. Continuing to monitor progress.

## 2018-11-27 NOTE — Progress Notes (Signed)
CRITICAL VALUE ALERT  Critical Value: Hemoglobin 5.5 at 0502 on 11/27/18  Date & Time Notied: 11/27/18 at [place]  Provider Notified: Dr. Rod Can  Orders Received/Actions taken: Dr. Rod Can stated to call lab and have them perform another lab draw (a STAT lab) to determine if the hemoglobin of 5.5 is accurate. Dr. Rod Can stated to make sure that the lab draw is done in the arm that does not have an IV. Then he stated to wait for further orders/next steps from Dr. Paralee Cancel when he does morning rounds.

## 2018-11-27 NOTE — Care Management Obs Status (Signed)
[place] NOTIFICATION   Patient Details  Name: CORNELIS KLUVER MRN: [id number] Date of Birth: [birth year]   Medicare Observation Status Notification Given:  Yes    Lia Hopping, LCSW 11/27/2018, 1:33 PM

## 2018-11-27 NOTE — Progress Notes (Signed)
Physical Therapy Treatment Patient Details Name: Travis Wolfe MRN: [id number] DOB: 1934/07/13 Today's Date: 11/27/2018    History of Present Illness 84 yo male s/p I &D of R THA, posterior incision utilized, on 11/26/18. PMH includes R THA with history of infection and I&D, PAF, falls, HLD, HTN, lumbar fusion, kyphoplasty, L RTC repair.    PT Comments    Pt eager to ambulate with PT and practice stair navigation with PT this session. Pt reports feeling much better since blood transfusions x2 this am, and his color appears much better. Pt performed all mobility tasks with supervision to min guard level of assist, ambulated 200 ft in hallway, and practiced stair navigation proficiently. Pt plans to d/c in care of his wife, pt excited to d/c home tomorrow.   Follow Up Recommendations  Follow surgeon's recommendation for DC plan and follow-up therapies;Supervision for mobility/OOB(HEP)     Equipment Recommendations  None recommended by PT    Recommendations for Other Services       Precautions / Restrictions Precautions Precautions: Fall;Posterior Hip Precaution Booklet Issued: No(Pt with posterior precautions due to previous surgeries, able to state precautions to PT) Weight Bearing Restrictions: No RLE Weight Bearing: Weight bearing as tolerated    Mobility  Bed Mobility Overal bed mobility: Needs Assistance Bed Mobility: Supine to Sit     Supine to sit: Supervision     General bed mobility comments: supervision for safety, pt adhering to posterior hip precautions. increased time and use of bedrails.  Transfers Overall transfer level: Needs assistance Equipment used: Rolling walker (2 wheeled) Transfers: Sit to/from Stand Sit to Stand: Supervision         General transfer comment: Supervision for safety, pt with self-steadying upon standing.  Ambulation/Gait Ambulation/Gait assistance: Min guard;Supervision Gait Distance (Feet): 200 Feet Assistive device:  Rolling walker (2 wheeled) Gait Pattern/deviations: Step-through pattern;Decreased stride length;Trunk flexed Gait velocity: decr   General Gait Details: Min guard to supervision for safety, verbal cuing for out-toeing when turning to R to avoid hip IR, upright posture.   Stairs Stairs: Yes Stairs assistance: Min guard Stair Management: One rail Right;Step to pattern;Forwards Number of Stairs: 3 General stair comments: min guard for safety, HHA L hand ascending and R hand descending for additional support. Pt with proper sequencing, PT reinforcing verbally during stair navigation.   Wheelchair Mobility    Modified Rankin (Stroke Patients Only)       Balance Overall balance assessment: Needs assistance;History of Falls Sitting-balance support: No upper extremity supported;Feet supported Sitting balance-Leahy Scale: Good     Standing balance support: Bilateral upper extremity supported Standing balance-Leahy Scale: Poor Standing balance comment: reliant on UE support                            Cognition Arousal/Alertness: Awake/alert Behavior During Therapy: WFL for tasks assessed/performed Overall Cognitive Status: Within Functional Limits for tasks assessed                                        Exercises     General Comments        Pertinent Vitals/Pain Pain Assessment: Faces Pain Score:  Faces Pain Scale: Hurts little more Pain Location: R hip Pain Descriptors / Indicators: Burning;Sore Pain Intervention(s): Limited activity within patient's tolerance;Monitored during session;Repositioned;Ice applied    Home Living  Prior Function            PT Goals (current goals can now be found in the care plan section) Acute Rehab PT Goals Patient Stated Goal: none stated PT Goal Formulation: With patient Time For Goal Achievement: 12/03/18 Potential to Achieve Goals: Good Progress towards PT goals:  Progressing toward goals    Frequency    Min 5X/week      PT Plan Current plan remains appropriate    Co-evaluation              AM-PAC PT "6 Clicks" Mobility   Outcome Measure  Help needed turning from your back to your side while in a flat bed without using bedrails?: A Little Help needed moving from lying on your back to sitting on the side of a flat bed without using bedrails?: A Little Help needed moving to and from a bed to a chair (including a wheelchair)?: A Little Help needed standing up from a chair using your arms (e.g., wheelchair or bedside chair)?: A Little Help needed to walk in hospital room?: A Little Help needed climbing 3-5 steps with a railing? : A Little 6 Click Score: 18    End of Session Equipment Utilized During Treatment: Gait belt Activity Tolerance: Patient tolerated treatment well Patient left: in chair;with chair alarm set;with call bell/phone within reach Nurse Communication: Mobility status PT Visit Diagnosis: Other abnormalities of gait and mobility (R26.89);Difficulty in walking, not elsewhere classified (R26.2)     Time: [id number] PT Time Calculation (min) (ACUTE ONLY): 25 min  Charges:  $Gait Training: 23-37 mins $Therapeutic Exercise: 8-22 mins                     Nicola PoliceAlexa D Dietrick Wolfe, PT Acute Rehabilitation Services Pager [phone]  Office [phone]    Travis Romey D Despina Hiddenure 11/27/2018, 4:06 PM

## 2018-11-27 NOTE — Progress Notes (Signed)
Physical Therapy Treatment Patient Details Name: Travis HeritageRobert L Caison MRN: [id number] DOB: 04/11/1935 Today's Date: 11/27/2018    History of Present Illness 84 yo male s/p I &D of R THA, posterior incision utilized, on 11/26/18. PMH includes R THA with history of infection and I&D, PAF, falls, HLD, HTN, lumbar fusion, kyphoplasty, L RTC repair.    PT Comments    Pt's Hgb dropped to 5.5 and per nursing was receiving his 1st of 2 units of blood.  Discussed with nursing and decided on bed level exercise for AM treatment.  Pt with reports of minimal pain.   Follow Up Recommendations  Follow surgeon's recommendation for DC plan and follow-up therapies;Supervision for mobility/OOB     Equipment Recommendations  None recommended by PT    Recommendations for Other Services       Precautions / Restrictions Precautions Precautions: Fall Precaution Comments: unsure if pt is to use posterior hip precautions, messaged Dr. Charlann Boxerlin via secure chat to determine precautions because none listed in orders. PT and pt utilized conservative approach on eval and maintained posterior precautions. Restrictions Weight Bearing Restrictions: No RLE Weight Bearing: Weight bearing as tolerated    Mobility  Bed Mobility                  Transfers                    Ambulation/Gait                 Stairs             Wheelchair Mobility    Modified Rankin (Stroke Patients Only)       Balance                                            Cognition Arousal/Alertness: Awake/alert Behavior During Therapy: WFL for tasks assessed/performed Overall Cognitive Status: Within Functional Limits for tasks assessed                                        Exercises Total Joint Exercises Ankle Circles/Pumps: AROM;Both;20 reps Quad Sets: Strengthening;Both;10 reps;Supine Gluteal Sets: Strengthening;10 reps;Supine Heel Slides: AROM;Supine;5 reps Hip  ABduction/ADduction: AROM;Right;10 reps;Supine    General Comments        Pertinent Vitals/Pain Pain Assessment: 0-10 Pain Score: 1  Pain Location: R hip Pain Descriptors / Indicators: Sore Pain Intervention(s): Monitored during session    Home Living                      Prior Function            PT Goals (current goals can now be found in the care plan section) Acute Rehab PT Goals Patient Stated Goal: none stated PT Goal Formulation: With patient Time For Goal Achievement: 12/03/18 Potential to Achieve Goals: Good Progress towards PT goals: Progressing toward goals    Frequency    Min 5X/week      PT Plan Current plan remains appropriate    Co-evaluation              AM-PAC PT "6 Clicks" Mobility   Outcome Measure  Help needed turning from your back to your side while in a flat bed without using bedrails?: A Little Help  needed moving from lying on your back to sitting on the side of a flat bed without using bedrails?: A Little Help needed moving to and from a bed to a chair (including a wheelchair)?: A Little Help needed standing up from a chair using your arms (e.g., wheelchair or bedside chair)?: A Little Help needed to walk in hospital room?: A Little Help needed climbing 3-5 steps with a railing? : A Lot 6 Click Score: 17    End of Session     Patient left: in bed;with call bell/phone within reach Nurse Communication: Other (comment)(regarding mobility and blood) PT Visit Diagnosis: Other abnormalities of gait and mobility (R26.89);Difficulty in walking, not elsewhere classified (R26.2)     Time: 5625-6389 PT Time Calculation (min) (ACUTE ONLY): 16 min  Charges:  $Therapeutic Exercise: 8-22 mins                     Breeanna Galgano L. Tamala Julian, [place] Pager [phone] 11/27/2018    Galen Manila 11/27/2018, 1:26 PM

## 2018-11-28 LAB — TYPE AND SCREEN
ABO/RH(D): A POS
Antibody Screen: NEGATIVE
Unit division: 0
Unit division: 0

## 2018-11-28 LAB — BPAM RBC
Blood Product Expiration Date: 202006272359
Blood Product Expiration Date: 202006272359
ISSUE DATE / TIME: 202006120826
ISSUE DATE / TIME: 202006121127
Unit Type and Rh: 6200
Unit Type and Rh: 6200

## 2018-11-28 LAB — BASIC METABOLIC PANEL
Anion gap: 6 (ref 5–15)
BUN: 28 mg/dL — ABNORMAL HIGH (ref 8–23)
CO2: 22 mmol/L (ref 22–32)
Calcium: 7.9 mg/dL — ABNORMAL LOW (ref 8.9–10.3)
Chloride: 111 mmol/L (ref 98–111)
Creatinine, Ser: 0.83 mg/dL (ref 0.61–1.24)
GFR calc Af Amer: >60 mL/min (ref >60)
GFR calc non Af Amer: >60 mL/min (ref >60)
Glucose, Bld: 115 mg/dL — ABNORMAL HIGH (ref 70–99)
Potassium: 4.2 mmol/L (ref 3.5–5.1)
Sodium: 139 mmol/L (ref 135–145)

## 2018-11-28 LAB — CBC
HCT: 21.2 % — ABNORMAL LOW (ref 39.0–52.0)
Hemoglobin: 7 g/dL — ABNORMAL LOW (ref 13.0–17.0)
MCH: 31.7 pg (ref 26.0–34.0)
MCHC: 33 g/dL (ref 30.0–36.0)
MCV: 95.9 fL (ref 80.0–100.0)
Platelets: 97 10*3/uL — ABNORMAL LOW (ref 150–400)
RBC: 2.21 MIL/uL — ABNORMAL LOW (ref 4.22–5.81)
RDW: 15.1 % (ref 11.5–15.5)
WBC: 10.3 10*3/uL (ref 4.0–10.5)
nRBC: 0 % (ref 0.0–0.2)

## 2018-11-28 MED ORDER — FERROUS SULFATE 325 (65 FE) MG PO TABS: 325.0000 mg | ORAL_TABLET | Freq: Three times a day (TID) | ORAL | 3 refills | Status: DC

## 2018-11-28 MED ORDER — HYDROCODONE-ACETAMINOPHEN 5-325 MG PO TABS: 1.0000 | ORAL_TABLET | Freq: Four times a day (QID) | ORAL | 0 refills | Status: DC | PRN

## 2018-11-28 MED ORDER — DOCUSATE SODIUM 100 MG PO CAPS: 100.0000 mg | ORAL_CAPSULE | Freq: Two times a day (BID) | ORAL | 0 refills | Status: DC

## 2018-11-28 MED ORDER — POLYETHYLENE GLYCOL 3350 17 G PO PACK: 17.0000 g | PACK | Freq: Two times a day (BID) | ORAL | 0 refills | Status: DC

## 2018-11-28 MED ORDER — METHOCARBAMOL 500 MG PO TABS: 500.0000 mg | ORAL_TABLET | Freq: Four times a day (QID) | ORAL | 0 refills | Status: DC | PRN

## 2018-11-28 NOTE — Progress Notes (Addendum)
     Subjective: 1 Days Post-Op Procedure(s) (LRB): IRRIGATION AND DEBRIDEMENT HIP (Right)   Patient reports pain as mild, pain controlled. Hgb down to 5.5, but otherwise no reported events throughout the night.  We have discussed transfusing him with 2 units of blood and he is good with that course.  He is looking forward to going home, but ok with staying to receive blood.  States that the hip feels better than it did prior to surgery.  Discussed surgery and how there was a lot of oozing during the case so the low hgb isn't too much of a surprise.   Objective:   VITALS:    11/27/18  0836  BP: (!) 141/59  Pulse: 77  Resp: 16  Temp: 97.9 F (36.6 C)  SpO2: 100%    Dorsiflexion/Plantar flexion intact Incision: dressing C/D/I No cellulitis present Compartment soft  LABS   11/27/18 0502  HGB 5.5*  HCT 17.9*  WBC 8.5  PLT 101*      11/27/18 0502  NA 137  K 4.2  BUN 29*  CREATININE 1.01  GLUCOSE 123*     Assessment/Plan: 1 Days Post-Op Procedure(s) (LRB): IRRIGATION AND DEBRIDEMENT HIP (Right) Foley cath d/c'ed Advance diet Up with therapy Discharge home eventually, when ready   ABLA  Transfuse 2 units of blood  Ordered a dose of tranexamic acid Also treated with iron and will observe      West Pugh. Josaiah Muhammed   PAC  11/28/2018, 9:27 AM

## 2018-11-28 NOTE — Progress Notes (Signed)
SHERLEY LESER  MRN: [id number] DOB/Age: Sep 06, 1934 84 y.o. Physician: Ander Slade, M.D. 2 Days Post-Op Procedure(s) (LRB): IRRIGATION AND DEBRIDEMENT HIP (Right)  Subjective: Patient is resting comfortably in bed this morning.  Good appetite.  Denies dizziness or shortness of breath.  Anxious to go home. Vital Signs Temp:  [97.7 F (36.5 C)-98.8 F (37.1 C)] 97.7 F (36.5 C) (06/13 0448) Pulse Rate:  [62-78] 62 (06/13 0448) Resp:  [16] 16 (06/13 0448) BP: (112-156)/(51-65) 112/52 (06/13 0448) SpO2:  [97 %-100 %] 97 % (06/13 0448)  Lab Results Recent Labs    11/27/18 0502 11/28/18 0233  WBC 8.5 10.3  HGB 5.5* 7.0*  HCT 17.9* 21.2*  PLT 101* 97*   BMET Recent Labs    11/27/18 0502 11/28/18 0233  NA 137 139  K 4.2 4.2  CL 109 111  CO2 19* 22  GLUCOSE 123* 115*  BUN 29* 28*  CREATININE 1.01 0.83  CALCIUM 7.6* 7.9*   INR  Date Value Ref Range Status  02/20/2018 1.03  Final    Comment:    Performed at [facility], [place] [address], [place], [place] [address]     Exam  Patient is alert and oriented.  Inspection of the right hip and thigh demonstrate that the Aquasol dressing is intact with no evidence for drainage into the dressing.  Mild diffuse swelling of the thigh but compartments are soft.  Neurovascular intact distally.   Hemoglobin is up to 7.0 after 2 units transfusion yesterday  Plan Discharge home today.  Is ambulating well with walker.  He will follow-up with Dr. Alvan Dame per the postop plan. Dameisha Tschida M Janny Crute 11/28/2018, 8:55 AM    Contact # [phone]

## 2018-12-03 NOTE — Discharge Summary (Signed)
Physician Discharge Summary  Patient ID: Travis Wolfe MRN: [id number] DOB/AGE: [birth year] 84 y.o.  Admit date: 11/26/2018 Discharge date: 11/28/2018   Procedures:  Procedure(s) (LRB): IRRIGATION AND DEBRIDEMENT HIP (Right)  Attending Physician:  Dr. Paralee Wolfe   Admission Diagnoses:   right hip pain  Discharge Diagnoses:  Principal Problem:   Arthrofibrosis of hip joint, right  Past Medical History:  Diagnosis Date   A-fib ([place])    REPORTS WAS DUE TO DEHYDRATION 3 YEARS AGO BUT WAS GIVEN FLUIDS IN ED AND MEDS TO SLOW HR AND DROVE HIMSELF HOME  ; HAD ABLATION [place] Travis Wolfe AS PRECAUTION  ; has been on eliquis since but reports he has not taken it for 5 weeks b/c he was told to hold it for the multiple irrigations and debridements for the hip infection. reports his cardiologist is aware he has been holding it;    Arthritis    Atherosclerosis of both carotid arteries    BPH (benign prostatic hyperplasia)    Fall    fell on friday 01-16-18;   lost balAnce whIile tryng to get his sock on ;sustained skin tear to left wrist ; no drainage  , scabbed over , and has been covering with bandage    GERD (gastroesophageal reflux disease)    Heart murmur    SINCE HIS 20s   HLD (hyperlipidemia)    Hypertension    Non-rheumatic mitral regurgitation    PVC (premature ventricular contraction)     HPI:    Travis Wolfe, 84 y.o. 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  Onset of symptoms was gradual starting 1 years ago with gradually worsening course since that time.The patient noted prior procedures of the hip to include arthroplasty with subsequent revision to ceramic on polyethylene liner on the right hip(s).   Patient currently rates pain in the right hip at 5 out of 10 with activity. Patient has pain that interfers with activities of daily living and joint swelling. Patient has evidence of large heterogeneous fluid collection with synovitis and debris about the arthroplastic femoral neck by imaging studies. This condition presents safety issues increasing the risk of falls.There is no current active infection.  PCP: Travis Wolfe., MD   Discharged Condition: good  Hospital Course:  Patient underwent the above stated procedure on 11/26/2018. Patient tolerated the procedure well and brought to the recovery room in good condition and subsequently to the floor.  POD #1 BP: 141/59 ; Pulse: 77 ; Temp: 97.9 F (36.6 C) ; Resp: 16 Patient reports pain as mild, pain controlled. Hgb down to 5.5, but otherwise no reported events throughout the night.  We have discussed transfusing him with 2 units of blood and he is good with that course.  He is looking forward to going home, but ok with staying to receive blood.  States that the hip feels better than it did prior to surgery.  Discussed surgery and how there was a lot of oozing during the case so the low hgb isn't too much of a surprise. Dorsiflexion/plantar flexion intact, incision: dressing C/D/I, no cellulitis present and compartment soft.   LABS  Basename    HGB     5.5  HCT     17.9   POD #2  BP:  112/52 ; Pulse: 62 ; Temp: 97.7 F (36.5 C) ; Resp: 16 Patient is resting comfortably in bed this morning.  Good appetite.  Denies dizziness or shortness of breath.  Anxious to go home. Patient is alert and oriented.  Inspection of the right hip and thigh demonstrate that the Aquasol dressing is intact with no evidence for drainage into the dressing.  Mild diffuse swelling of the thigh but compartments are soft. Neurovascular intact distally.  LABS  Basename    HGB     7.0  HCT     21.2    Discharge Exam: General appearance: alert, cooperative and no  distress Extremities: Homans sign is negative, no sign of DVT, no edema, redness or tenderness in the calves or thighs and no ulcers, gangrene or trophic changes  Disposition:  Home with follow up in 2 weeks   Follow-up Information    Travis Cancel, MD. Schedule an appointment as soon as possible for a visit in 2 weeks.   Specialty: Orthopedic Surgery Contact information: [address] [place] [address] [phone]           Discharge Instructions    Call MD / Call 911   Complete by: As directed    If you experience chest pain or shortness of breath, CALL 911 and be transported to the hospital emergency room.  If you develope a fever above 101 F, pus (white drainage) or increased drainage or redness at the wound, or calf pain, call your surgeon's office.   Change dressing   Complete by: As directed    Maintain surgical dressing until follow up in the clinic. If the edges start to pull up, may reinforce with tape. If the dressing is no longer working, may remove and cover with gauze and tape, but must keep the area dry and clean.  Call with any questions or concerns.   Constipation Prevention   Complete by: As directed    Drink plenty of fluids.  Prune juice may be helpful.  You may use a stool softener, such as Colace (over the counter) 100 mg twice a day.  Use MiraLax (over the counter) for constipation as needed.   Diet - low sodium heart healthy   Complete by: As directed    Discharge instructions   Complete by: As directed    Maintain surgical dressing until follow up in the clinic. If the edges start to pull up, may reinforce with tape. If the dressing is no longer working, may remove and cover with gauze and tape, but must keep the area dry and clean.  Follow up in 2 weeks at [facility]. Call with any questions or concerns.   Follow the hip precautions as taught in Physical Therapy   Complete by: As directed    Increase activity slowly as  tolerated   Complete by: As directed    Weight bearing as tolerated with assist device (walker, cane, etc) as directed, use it as long as suggested by your surgeon or therapist, typically at least 4-6 weeks.   TED hose   Complete by: As directed    Use stockings (TED hose) for 2 weeks on both leg(s).  You may remove them at night for sleeping.      Allergies as of 11/28/2018      Reactions   Other    Cat dander   Horse Epithelium Rash      Medication List    STOP taking these medications  finasteride 5 MG tablet Commonly known as: PROSCAR   HYDROcodone-acetaminophen 7.5-325 MG tablet Commonly known as: Norco Replaced by: HYDROcodone-acetaminophen 5-325 MG tablet     TAKE these medications   atorvastatin 40 MG tablet Commonly known as: LIPITOR Take 40 mg by mouth daily.   brimonidine 0.15 % ophthalmic solution Commonly known as: ALPHAGAN Place 1 drop into both eyes at bedtime.   Cosopt PF 22.3-6.8 MG/ML Soln ophthalmic solution Generic drug: dorzolamidel-timolol Place 1 drop into both eyes every morning.   docusate sodium 100 MG capsule Commonly known as: Colace Take 1 capsule (100 mg total) by mouth 2 (two) times daily.   Eliquis 5 MG Tabs tablet Generic drug: apixaban Take 5 mg by mouth 2 (two) times daily.   Eszopiclone 3 MG Tabs Take 3 mg by mouth at bedtime.   ferrous sulfate 325 (65 FE) MG tablet Commonly known as: FerrouSul Take 1 tablet (325 mg total) by mouth 3 (three) times daily with meals. What changed: when to take this   flecainide 50 MG tablet Commonly known as: TAMBOCOR Take 25 mg by mouth 2 (two) times daily.   gabapentin 300 MG capsule Commonly known as: NEURONTIN Take 1 capsule by mouth once daily What changed:   when to take this  reasons to take this   HYDROcodone-acetaminophen 5-325 MG tablet Commonly known as: NORCO/VICODIN Take 1-2 tablets by mouth every 6 (six) hours as needed for moderate pain. Replaces:  HYDROcodone-acetaminophen 7.5-325 MG tablet   LYCOPENE PO Take 1 tablet by mouth daily.   methocarbamol 500 MG tablet Commonly known as: Robaxin Take 1 tablet (500 mg total) by mouth every 6 (six) hours as needed for muscle spasms.   multivitamin-lutein Caps capsule Take 1 capsule by mouth daily.   Narcan 4 MG/0.1ML Liqd nasal spray kit Generic drug: naloxone Place 1 spray into the nose once.   pantoprazole 40 MG tablet Commonly known as: PROTONIX Take 40 mg by mouth daily as needed for heartburn.   polyethylene glycol 17 g packet Commonly known as: MIRALAX / GLYCOLAX Take 17 g by mouth 2 (two) times daily. What changed:   when to take this  reasons to take this   potassium chloride 10 MEQ tablet Commonly known as: K-DUR Take 10 mEq by mouth daily.            Discharge Care Instructions  (From admission, onward)         Start     Ordered   11/28/18 0000  Change dressing    Comments: Maintain surgical dressing until follow up in the clinic. If the edges start to pull up, may reinforce with tape. If the dressing is no longer working, may remove and cover with gauze and tape, but must keep the area dry and clean.  Call with any questions or concerns.   11/28/18 7159           Signed: West Pugh. Simrah Chatham   PA-C  12/03/2018, 8:02 AM

## 2018-12-14 ENCOUNTER — Other Ambulatory Visit: Payer: Self-pay | Admitting: Internal Medicine

## 2019-02-18 ENCOUNTER — Ambulatory Visit: Payer: Self-pay | Admitting: Physician Assistant

## 2019-02-18 NOTE — H&P (View-Only) (Signed)
Travis Wolfe is an 84 y.o. male.   Chief Complaint: left shoulder pain HPI:  He comes in today for a follow-up of his left shoulder MRI.   He has some pathology related degenerative changes, mild.  There is some labral pathology posteriorly, tendon issues without overt tearing.  He has a significant lack of external rotation. It is really hard for me to say this is just based on osteoarthritis which does not appear to be severe versus an element of a frozen shoulder. He  did start this process long ago with a traumatic event.  He  has had one injection done elsewhere over a year ago.   Past Medical History:  Diagnosis Date  . A-fib (HCC)    REPORTS WAS DUE TO DEHYDRATION 3 YEARS AGO BUT WAS GIVEN FLUIDS IN ED AND MEDS TO SLOW HR AND DROVE HIMSELF HOME  ; HAD ABLATION X2 WITH CARDIO DR. AKBARY AS PRECAUTION  ; has been on eliquis since but reports he has not taken it for 5 weeks b/c he was told to hold it for the multiple irrigations and debridements for the hip infection. reports his cardiologist is aware he has been holding it;   . Arthritis   . Atherosclerosis of both carotid arteries   . BPH (benign prostatic hyperplasia)   . Fall    fell on friday 01-16-18;   lost balAnce whIile tryng to get his sock on ;sustained skin tear to left wrist ; no drainage  , scabbed over , and has been covering with bandage   . GERD (gastroesophageal reflux disease)   . Heart murmur    SINCE HIS 20s  . HLD (hyperlipidemia)   . Hypertension   . Non-rheumatic mitral regurgitation   . PVC (premature ventricular contraction)     Past Surgical History:  Procedure Laterality Date  . BACK SURGERY  2007   L1-L5 LUMBAR WITH HARDWARE PLACED  . CARDIOVERSION    . CATARACT EXTRACTION, BILATERAL  2015  . CRYOABLATION     CARDIAC ABLATION   . DACROCYSTORHINOSTOMY  07/26/2011  . EYE SURGERY    . HERNIA REPAIR Bilateral   . hx of echocardiogram     . INCISION AND DRAINAGE HIP Right 01/26/2018   Procedure: Right  hip irrigation and debridement, excisional and non excisional debridement, head liner exchange, posterior approach;  Surgeon: Durene Romanslin, Matthew, MD;  Location: [facility];  Service: Orthopedics;  Laterality: Right;  90 mins Would like to start earlier around 2:00pm if time opens  . INCISION AND DRAINAGE HIP Right 11/26/2018   Procedure: IRRIGATION AND DEBRIDEMENT HIP;  Surgeon: Durene Romanslin, Matthew, MD;  Location: [facility];  Service: Orthopedics;  Laterality: Right;  90 mins  . INGUINAL HERNIA REPAIR Bilateral    with mesh   . IR US GUIDE BX ASP/DRAIN  12/17/2017  . IRRIGATION AND ASPIRATION RIGHT HIP   12/10/2017   [facility]  x2, [place] x1   . KIDNEY STONE SURGERY     lithotripsy   . KYPHOPLASTY     T7  . LATERAL FUSION LUMBAR SPINE     L1-L5  . PULMONARY VEIN ISOLATION AND LEFT ATRIAL ROOFLINE ABLATION   10/15/2017   DR Rudolpho SevinAKBARY   . ROTATOR CUFF REPAIR Left   . SHOULDER DEBRIDEMENT Right   . SHOULDER OPEN ROTATOR CUFF REPAIR Left   . SHOULDER SURGERY  1988   arthroscopy    . TONSILLECTOMY    . TOTAL HIP ARTHROPLASTY  done twice  . TOTAL HIP ARTHROPLASTY  2010  . TOTAL HIP REVISION      05-2017, 10-2016    Family History  Problem Relation Age of Onset  . Hypertension Mother   . Cancer Mother    Social History:  reports that he has never smoked. He has never used smokeless tobacco. He reports previous alcohol use. He reports that he does not use drugs.  Allergies:  Allergies  Allergen Reactions  . Other     Cat dander  . Horse Epithelium Rash    (Not in a hospital admission)   No results found for this or any previous visit (from the past 48 hour(s)). No results found.  Review of Systems  Musculoskeletal: Positive for joint pain.  All other systems reviewed and are negative.   There were no vitals taken for this visit. Physical Exam  Constitutional: He is oriented to person, place, and time. He appears well-developed and well-nourished. No distress.  HENT:  Head: Normocephalic  and atraumatic.  Eyes: Pupils are equal, round, and reactive to light. Conjunctivae are normal.  Neck: Normal range of motion. Neck supple.  Cardiovascular: Normal rate and intact distal pulses.  Respiratory: Effort normal. No respiratory distress.  GI: Soft. He exhibits no distension.  Musculoskeletal:     Left shoulder: He exhibits decreased range of motion, tenderness, pain and spasm.  Neurological: He is alert and oriented to person, place, and time.  Skin: Skin is warm and dry.  Psychiatric: He has a normal mood and affect. His behavior is normal.     Assessment/Plan Left shoulder impingement  Failed conservative treatment discussed risks and benefits of left shoulder scope, SAD, DCR and pt wishes to proceed.  We obtained med and cards clearance will proceed with outpt surgery.  Chriss Czar, PA-C 02/18/2019, 9:35 PM

## 2019-02-18 NOTE — H&P (Signed)
Travis Wolfe is an 84 y.o. male.   Chief Complaint: left shoulder pain HPI:  He comes in today for a follow-up of his left shoulder MRI.   He has some pathology related degenerative changes, mild.  There is some labral pathology posteriorly, tendon issues without overt tearing.  He has a significant lack of external rotation. It is really hard for me to say this is just based on osteoarthritis which does not appear to be severe versus an element of a frozen shoulder. He  did start this process long ago with a traumatic event.  He  has had one injection done elsewhere over a year ago.   Past Medical History:  Diagnosis Date  . A-fib (HCC)    REPORTS WAS DUE TO DEHYDRATION 3 YEARS AGO BUT WAS GIVEN FLUIDS IN ED AND MEDS TO SLOW HR AND DROVE HIMSELF HOME  ; HAD ABLATION X2 WITH CARDIO DR. AKBARY AS PRECAUTION  ; has been on eliquis since but reports he has not taken it for 5 weeks b/c he was told to hold it for the multiple irrigations and debridements for the hip infection. reports his cardiologist is aware he has been holding it;   . Arthritis   . Atherosclerosis of both carotid arteries   . BPH (benign prostatic hyperplasia)   . Fall    fell on friday 01-16-18;   lost balAnce whIile tryng to get his sock on ;sustained skin tear to left wrist ; no drainage  , scabbed over , and has been covering with bandage   . GERD (gastroesophageal reflux disease)   . Heart murmur    SINCE HIS 20s  . HLD (hyperlipidemia)   . Hypertension   . Non-rheumatic mitral regurgitation   . PVC (premature ventricular contraction)     Past Surgical History:  Procedure Laterality Date  . BACK SURGERY  2007   L1-L5 LUMBAR WITH HARDWARE PLACED  . CARDIOVERSION    . CATARACT EXTRACTION, BILATERAL  2015  . CRYOABLATION     CARDIAC ABLATION   . DACROCYSTORHINOSTOMY  07/26/2011  . EYE SURGERY    . HERNIA REPAIR Bilateral   . hx of echocardiogram     . INCISION AND DRAINAGE HIP Right 01/26/2018   Procedure: Right  hip irrigation and debridement, excisional and non excisional debridement, head liner exchange, posterior approach;  Surgeon: Olin, Matthew, MD;  Location: [facility];  Service: Orthopedics;  Laterality: Right;  90 mins Would like to start earlier around 2:00pm if time opens  . INCISION AND DRAINAGE HIP Right 11/26/2018   Procedure: IRRIGATION AND DEBRIDEMENT HIP;  Surgeon: Olin, Matthew, MD;  Location: [facility];  Service: Orthopedics;  Laterality: Right;  90 mins  . INGUINAL HERNIA REPAIR Bilateral    with mesh   . IR US GUIDE BX ASP/DRAIN  12/17/2017  . IRRIGATION AND ASPIRATION RIGHT HIP   12/10/2017   [facility]  x2, [place] x1   . KIDNEY STONE SURGERY     lithotripsy   . KYPHOPLASTY     T7  . LATERAL FUSION LUMBAR SPINE     L1-L5  . PULMONARY VEIN ISOLATION AND LEFT ATRIAL ROOFLINE ABLATION   10/15/2017   DR AKBARY   . ROTATOR CUFF REPAIR Left   . SHOULDER DEBRIDEMENT Right   . SHOULDER OPEN ROTATOR CUFF REPAIR Left   . SHOULDER SURGERY  1988   arthroscopy    . TONSILLECTOMY    . TOTAL HIP ARTHROPLASTY       done twice  . TOTAL HIP ARTHROPLASTY  2010  . TOTAL HIP REVISION      05-2017, 10-2016    Family History  Problem Relation Age of Onset  . Hypertension Mother   . Cancer Mother    Social History:  reports that he has never smoked. He has never used smokeless tobacco. He reports previous alcohol use. He reports that he does not use drugs.  Allergies:  Allergies  Allergen Reactions  . Other     Cat dander  . Horse Epithelium Rash    (Not in a hospital admission)   No results found for this or any previous visit (from the past 48 hour(s)). No results found.  Review of Systems  Musculoskeletal: Positive for joint pain.  All other systems reviewed and are negative.   There were no vitals taken for this visit. Physical Exam  Constitutional: He is oriented to person, place, and time. He appears well-developed and well-nourished. No distress.  HENT:  Head: Normocephalic  and atraumatic.  Eyes: Pupils are equal, round, and reactive to light. Conjunctivae are normal.  Neck: Normal range of motion. Neck supple.  Cardiovascular: Normal rate and intact distal pulses.  Respiratory: Effort normal. No respiratory distress.  GI: Soft. He exhibits no distension.  Musculoskeletal:     Left shoulder: He exhibits decreased range of motion, tenderness, pain and spasm.  Neurological: He is alert and oriented to person, place, and time.  Skin: Skin is warm and dry.  Psychiatric: He has a normal mood and affect. His behavior is normal.     Assessment/Plan Left shoulder impingement  Failed conservative treatment discussed risks and benefits of left shoulder scope, SAD, DCR and pt wishes to proceed.  We obtained med and cards clearance will proceed with outpt surgery.  Chriss Czar, PA-C 02/18/2019, 9:35 PM

## 2019-02-23 ENCOUNTER — Other Ambulatory Visit: Payer: Self-pay

## 2019-02-23 ENCOUNTER — Encounter (HOSPITAL_BASED_OUTPATIENT_CLINIC_OR_DEPARTMENT_OTHER): Payer: Self-pay | Admitting: *Deleted

## 2019-02-25 ENCOUNTER — Other Ambulatory Visit (HOSPITAL_COMMUNITY)
Admission: RE | Admit: 2019-02-25 | Discharge: 2019-02-25 | Disposition: A | Discharge status: Home / Self Care | Payer: Medicare Other | Source: Ambulatory Visit | Attending: Orthopedic Surgery | Admitting: Orthopedic Surgery

## 2019-02-25 ENCOUNTER — Encounter (HOSPITAL_BASED_OUTPATIENT_CLINIC_OR_DEPARTMENT_OTHER)
Admission: RE | Admit: 2019-02-25 | Discharge: 2019-02-25 | Disposition: A | Discharge status: Home / Self Care | Payer: Medicare Other | Source: Ambulatory Visit | Attending: Orthopedic Surgery | Admitting: Orthopedic Surgery

## 2019-02-25 ENCOUNTER — Other Ambulatory Visit: Payer: Self-pay

## 2019-02-25 DIAGNOSIS — Z01818 Encounter for other preprocedural examination: Secondary | ICD-10-CM | POA: Insufficient documentation

## 2019-02-25 DIAGNOSIS — Z20828 Contact with and (suspected) exposure to other viral communicable diseases: Secondary | ICD-10-CM | POA: Diagnosis not present

## 2019-02-25 LAB — BASIC METABOLIC PANEL
Anion gap: 8 (ref 5–15)
BUN: 16 mg/dL (ref 8–23)
CO2: 24 mmol/L (ref 22–32)
Calcium: 8.9 mg/dL (ref 8.9–10.3)
Chloride: 111 mmol/L (ref 98–111)
Creatinine, Ser: 0.79 mg/dL (ref 0.61–1.24)
GFR calc Af Amer: >60 mL/min (ref >60)
GFR calc non Af Amer: >60 mL/min (ref >60)
Glucose, Bld: 96 mg/dL (ref 70–99)
Potassium: 3.8 mmol/L (ref 3.5–5.1)
Sodium: 143 mmol/L (ref 135–145)

## 2019-02-25 NOTE — Progress Notes (Signed)
Left voice message about patients missed COVID appointment this morning.  Noted that the patient had a pre-op appointment that started at 1130 so I think the patient will come after his pre-op appointment

## 2019-02-25 NOTE — Progress Notes (Signed)

## 2019-02-26 LAB — NOVEL CORONAVIRUS, NAA (HOSP ORDER, SEND-OUT TO REF LAB; TAT 18-24 HRS): SARS-CoV-2, NAA: NOT DETECTED

## 2019-03-01 ENCOUNTER — Encounter (HOSPITAL_BASED_OUTPATIENT_CLINIC_OR_DEPARTMENT_OTHER): Payer: Self-pay

## 2019-03-01 ENCOUNTER — Ambulatory Visit (HOSPITAL_BASED_OUTPATIENT_CLINIC_OR_DEPARTMENT_OTHER): Payer: Medicare Other | Admitting: Certified Registered"

## 2019-03-01 ENCOUNTER — Other Ambulatory Visit: Payer: Self-pay

## 2019-03-01 ENCOUNTER — Encounter (HOSPITAL_BASED_OUTPATIENT_CLINIC_OR_DEPARTMENT_OTHER)
Disposition: A | Discharge status: Home / Self Care | Payer: Self-pay | Source: Home / Self Care | Attending: Orthopedic Surgery

## 2019-03-01 ENCOUNTER — Ambulatory Visit (HOSPITAL_BASED_OUTPATIENT_CLINIC_OR_DEPARTMENT_OTHER)
Admit: 2019-03-01 | Discharge: 2019-03-01 | Disposition: A | Discharge status: Home / Self Care | Payer: Medicare Other | Attending: Orthopedic Surgery | Admitting: Orthopedic Surgery

## 2019-03-01 DIAGNOSIS — M75112 Incomplete rotator cuff tear or rupture of left shoulder, not specified as traumatic: Secondary | ICD-10-CM | POA: Diagnosis present

## 2019-03-01 DIAGNOSIS — M25812 Other specified joint disorders, left shoulder: Secondary | ICD-10-CM | POA: Diagnosis not present

## 2019-03-01 DIAGNOSIS — Z881 Allergy status to other antibiotic agents status: Secondary | ICD-10-CM | POA: Insufficient documentation

## 2019-03-01 DIAGNOSIS — Z96649 Presence of unspecified artificial hip joint: Secondary | ICD-10-CM | POA: Insufficient documentation

## 2019-03-01 DIAGNOSIS — M94212 Chondromalacia, left shoulder: Secondary | ICD-10-CM | POA: Diagnosis not present

## 2019-03-01 DIAGNOSIS — M19012 Primary osteoarthritis, left shoulder: Secondary | ICD-10-CM | POA: Insufficient documentation

## 2019-03-01 DIAGNOSIS — E785 Hyperlipidemia, unspecified: Secondary | ICD-10-CM | POA: Insufficient documentation

## 2019-03-01 DIAGNOSIS — K219 Gastro-esophageal reflux disease without esophagitis: Secondary | ICD-10-CM | POA: Diagnosis not present

## 2019-03-01 DIAGNOSIS — I10 Essential (primary) hypertension: Secondary | ICD-10-CM | POA: Diagnosis not present

## 2019-03-01 HISTORY — DX: Cardiac arrhythmia, unspecified: I49.9

## 2019-03-01 SURGERY — SHOULDER ARTHROSCOPY WITH SUBACROMIAL DECOMPRESSION AND DISTAL CLAVICLE EXCISION
Anesthesia: General | Site: Shoulder | Laterality: Left

## 2019-03-01 MED ORDER — SODIUM CHLORIDE 0.9 % IR SOLN
Status: DC | PRN
  Administered 2019-03-01: 1
  Administered 2019-03-01: 2

## 2019-03-01 MED ORDER — ONDANSETRON HCL 4 MG/2ML IJ SOLN: 4.0000 mg | Freq: Four times a day (QID) | INTRAMUSCULAR | Status: DC | PRN

## 2019-03-01 MED ORDER — BUPIVACAINE-EPINEPHRINE 0.5% -1:200000 IJ SOLN
INTRAMUSCULAR | Status: DC | PRN
  Administered 2019-03-01: 5 mL

## 2019-03-01 MED ORDER — CEFAZOLIN SODIUM-DEXTROSE 2-4 GM/100ML-% IV SOLN
2.0000 g | INTRAVENOUS | Status: AC
  Administered 2019-03-01: 2 g via INTRAVENOUS

## 2019-03-01 MED ORDER — BUPIVACAINE-EPINEPHRINE (PF) 0.5% -1:200000 IJ SOLN
INTRAMUSCULAR | Status: DC | PRN
  Administered 2019-03-01: 15 mL via PERINEURAL

## 2019-03-01 MED ORDER — METHOCARBAMOL 500 MG PO TABS: 500.0000 mg | ORAL_TABLET | Freq: Four times a day (QID) | ORAL | Status: DC | PRN

## 2019-03-01 MED ORDER — BUPIVACAINE LIPOSOME 1.3 % IJ SUSP
INTRAMUSCULAR | Status: DC | PRN
  Administered 2019-03-01: 10 mL via PERINEURAL

## 2019-03-01 MED ORDER — DEXAMETHASONE SODIUM PHOSPHATE 4 MG/ML IJ SOLN
INTRAMUSCULAR | Status: DC | PRN
  Administered 2019-03-01: 4 mg via INTRAVENOUS

## 2019-03-01 MED ORDER — OXYCODONE HCL 5 MG/5ML PO SOLN: 5.0000 mg | Freq: Once | ORAL | Status: DC | PRN

## 2019-03-01 MED ORDER — FENTANYL CITRATE (PF) 100 MCG/2ML IJ SOLN
INTRAMUSCULAR | Status: AC
  Filled 2019-03-01: qty 2

## 2019-03-01 MED ORDER — PROMETHAZINE HCL 25 MG/ML IJ SOLN: 6.2500 mg | INTRAMUSCULAR | Status: DC | PRN

## 2019-03-01 MED ORDER — METHYLPREDNISOLONE ACETATE 80 MG/ML IJ SUSP
INTRAMUSCULAR | Status: DC | PRN
  Administered 2019-03-01: 80 mg

## 2019-03-01 MED ORDER — CHLORHEXIDINE GLUCONATE 4 % EX LIQD: 60.0000 mL | Freq: Once | CUTANEOUS | Status: DC

## 2019-03-01 MED ORDER — PROPOFOL 10 MG/ML IV BOLUS
INTRAVENOUS | Status: DC | PRN
  Administered 2019-03-01: 110 mg via INTRAVENOUS

## 2019-03-01 MED ORDER — LACTATED RINGERS IV SOLN
INTRAVENOUS | Status: DC
  Administered 2019-03-01: 11:00:00 via INTRAVENOUS

## 2019-03-01 MED ORDER — METOCLOPRAMIDE HCL 5 MG PO TABS: 5.0000 mg | ORAL_TABLET | Freq: Three times a day (TID) | ORAL | Status: DC | PRN

## 2019-03-01 MED ORDER — MIDAZOLAM HCL 2 MG/2ML IJ SOLN
1.0000 mg | INTRAMUSCULAR | Status: DC | PRN
  Administered 2019-03-01: 11:00:00 1 mg via INTRAVENOUS

## 2019-03-01 MED ORDER — OXYCODONE HCL 5 MG PO TABS: 5.0000 mg | ORAL_TABLET | ORAL | Status: DC | PRN

## 2019-03-01 MED ORDER — METOCLOPRAMIDE HCL 5 MG/ML IJ SOLN: 5.0000 mg | Freq: Three times a day (TID) | INTRAMUSCULAR | Status: DC | PRN

## 2019-03-01 MED ORDER — FENTANYL CITRATE (PF) 100 MCG/2ML IJ SOLN: 25.0000 ug | INTRAMUSCULAR | Status: DC | PRN

## 2019-03-01 MED ORDER — LIDOCAINE 2% (20 MG/ML) 5 ML SYRINGE
INTRAMUSCULAR | Status: DC | PRN
  Administered 2019-03-01: 40 mg via INTRAVENOUS
  Administered 2019-03-01: 20 mg via INTRAVENOUS

## 2019-03-01 MED ORDER — SUGAMMADEX SODIUM 200 MG/2ML IV SOLN
INTRAVENOUS | Status: DC | PRN
  Administered 2019-03-01: 200 mg via INTRAVENOUS

## 2019-03-01 MED ORDER — DOCUSATE SODIUM 100 MG PO CAPS: 100.0000 mg | ORAL_CAPSULE | Freq: Two times a day (BID) | ORAL | Status: DC

## 2019-03-01 MED ORDER — OXYCODONE HCL 5 MG PO TABS: ORAL_TABLET | ORAL | 0 refills | Status: DC

## 2019-03-01 MED ORDER — SODIUM CHLORIDE 0.9 % IV SOLN: INTRAVENOUS | Status: DC

## 2019-03-01 MED ORDER — FENTANYL CITRATE (PF) 100 MCG/2ML IJ SOLN
50.0000 ug | INTRAMUSCULAR | Status: DC | PRN
  Administered 2019-03-01: 50 ug via INTRAVENOUS

## 2019-03-01 MED ORDER — CEFAZOLIN SODIUM-DEXTROSE 2-4 GM/100ML-% IV SOLN
INTRAVENOUS | Status: AC
  Filled 2019-03-01: qty 100

## 2019-03-01 MED ORDER — ACETAMINOPHEN 500 MG PO TABS
1000.0000 mg | ORAL_TABLET | Freq: Once | ORAL | Status: AC
  Administered 2019-03-01: 1000 mg via ORAL

## 2019-03-01 MED ORDER — ACETAMINOPHEN 500 MG PO TABS
ORAL_TABLET | ORAL | Status: AC
  Filled 2019-03-01: qty 2

## 2019-03-01 MED ORDER — EPHEDRINE SULFATE 50 MG/ML IJ SOLN
INTRAMUSCULAR | Status: DC | PRN
  Administered 2019-03-01: 5 mg via INTRAVENOUS

## 2019-03-01 MED ORDER — ROCURONIUM BROMIDE 100 MG/10ML IV SOLN
INTRAVENOUS | Status: DC | PRN
  Administered 2019-03-01: 40 mg via INTRAVENOUS

## 2019-03-01 MED ORDER — ACETAMINOPHEN 500 MG PO TABS: 1000.0000 mg | ORAL_TABLET | Freq: Four times a day (QID) | ORAL | Status: DC

## 2019-03-01 MED ORDER — MIDAZOLAM HCL 2 MG/2ML IJ SOLN
INTRAMUSCULAR | Status: AC
  Filled 2019-03-01: qty 2

## 2019-03-01 MED ORDER — SODIUM CHLORIDE 0.9 % IV SOLN
INTRAVENOUS | Status: DC | PRN
  Administered 2019-03-01: 13:00:00 50 ug/min via INTRAVENOUS

## 2019-03-01 MED ORDER — ONDANSETRON HCL 4 MG PO TABS: 4.0000 mg | ORAL_TABLET | Freq: Four times a day (QID) | ORAL | Status: DC | PRN

## 2019-03-01 MED ORDER — SCOPOLAMINE 1 MG/3DAYS TD PT72: 1.0000 | MEDICATED_PATCH | Freq: Once | TRANSDERMAL | Status: DC

## 2019-03-01 MED ORDER — ACETAMINOPHEN 325 MG PO TABS: 325.0000 mg | ORAL_TABLET | Freq: Four times a day (QID) | ORAL | Status: DC | PRN

## 2019-03-01 MED ORDER — OXYCODONE HCL 5 MG PO TABS: 5.0000 mg | ORAL_TABLET | Freq: Once | ORAL | Status: DC | PRN

## 2019-03-01 MED ORDER — METHYLPREDNISOLONE ACETATE 80 MG/ML IJ SUSP
INTRAMUSCULAR | Status: AC
  Filled 2019-03-01: qty 1

## 2019-03-01 MED ORDER — HYDROMORPHONE HCL 1 MG/ML IJ SOLN: 0.5000 mg | INTRAMUSCULAR | Status: DC | PRN

## 2019-03-01 SURGICAL SUPPLY — 76 items
BENZOIN TINCTURE PRP APPL 2/3 (GAUZE/BANDAGES/DRESSINGS) IMPLANT
BLADE AVERAGE 25X9 (BLADE) IMPLANT
BLADE SURG 15 STRL LF DISP TIS (BLADE) ×1 IMPLANT
BLADE SURG 15 STRL SS (BLADE) ×1
BUR EGG 3PK/BX (BURR) IMPLANT
BUR OVAL 4.0 (BURR) IMPLANT
BURR OVAL 8 FLU 5.0X13 (MISCELLANEOUS) ×2 IMPLANT
CANNULA 5.75X71 LONG (CANNULA) IMPLANT
CANNULA SHOULDER 7CM (CANNULA) ×2 IMPLANT
CANNULA TWIST IN 8.25X7CM (CANNULA) IMPLANT
CLEANER CAUTERY TIP 5X5 PAD (MISCELLANEOUS) IMPLANT
COVER WAND RF STERILE (DRAPES) IMPLANT
DECANTER SPIKE VIAL GLASS SM (MISCELLANEOUS) IMPLANT
DISSECTOR  3.8MM X 13CM (MISCELLANEOUS)
DISSECTOR 3.8MM X 13CM (MISCELLANEOUS) IMPLANT
DISSECTOR 4.0MM X 13CM (MISCELLANEOUS) IMPLANT
DRAPE SPLIT 6X30 W/TAPE (DRAPES) ×4 IMPLANT
DRAPE STERI 35X30 U-POUCH (DRAPES) ×2 IMPLANT
DRAPE SURG 17X23 STRL (DRAPES) ×2 IMPLANT
DRSG EMULSION OIL 3X3 NADH (GAUZE/BANDAGES/DRESSINGS) ×4 IMPLANT
DRSG MEPILEX BORDER 4X4 (GAUZE/BANDAGES/DRESSINGS) IMPLANT
DRSG MEPILEX BORDER 4X8 (GAUZE/BANDAGES/DRESSINGS) IMPLANT
DRSG PAD ABDOMINAL 8X10 ST (GAUZE/BANDAGES/DRESSINGS) ×2 IMPLANT
DURAPREP 26ML APPLICATOR (WOUND CARE) ×2 IMPLANT
ELECT REM PT RETURN 9FT ADLT (ELECTROSURGICAL)
ELECTRODE REM PT RTRN 9FT ADLT (ELECTROSURGICAL) IMPLANT
GAUZE SPONGE 4X4 12PLY STRL (GAUZE/BANDAGES/DRESSINGS) ×4 IMPLANT
GLOVE BIO SURGEON STRL SZ7.5 (GLOVE) ×2 IMPLANT
GLOVE BIOGEL PI IND STRL 7.0 (GLOVE) ×1 IMPLANT
GLOVE BIOGEL PI IND STRL 8 (GLOVE) ×2 IMPLANT
GLOVE BIOGEL PI INDICATOR 7.0 (GLOVE) ×1
GLOVE BIOGEL PI INDICATOR 8 (GLOVE) ×2
GLOVE ECLIPSE 7.0 STRL STRAW (GLOVE) ×2 IMPLANT
GLOVE SURG ORTHO 8.0 STRL STRW (GLOVE) ×2 IMPLANT
GOWN STRL REUS W/ TWL LRG LVL3 (GOWN DISPOSABLE) ×1 IMPLANT
GOWN STRL REUS W/ TWL XL LVL3 (GOWN DISPOSABLE) ×1 IMPLANT
GOWN STRL REUS W/TWL LRG LVL3 (GOWN DISPOSABLE) ×1
GOWN STRL REUS W/TWL XL LVL3 (GOWN DISPOSABLE) ×3 IMPLANT
MANIFOLD NEPTUNE II (INSTRUMENTS) ×2 IMPLANT
NEEDLE 1/2 CIR CATGUT .05X1.09 (NEEDLE) IMPLANT
NEEDLE SCORPION MULTI FIRE (NEEDLE) IMPLANT
NS IRRIG 1000ML POUR BTL (IV SOLUTION) ×2 IMPLANT
PACK ARTHROSCOPY DSU (CUSTOM PROCEDURE TRAY) ×2 IMPLANT
PACK BASIN DAY SURGERY FS (CUSTOM PROCEDURE TRAY) ×2 IMPLANT
PAD CLEANER CAUTERY TIP 5X5 (MISCELLANEOUS)
PAD ORTHO SHOULDER 7X19 LRG (SOFTGOODS) ×2 IMPLANT
PENCIL BUTTON HOLSTER BLD 10FT (ELECTRODE) IMPLANT
PORT APPOLLO RF 90DEGREE MULTI (SURGICAL WAND) ×2 IMPLANT
RESTRAINT HEAD UNIVERSAL NS (MISCELLANEOUS) ×2 IMPLANT
SLEEVE SCD COMPRESS KNEE MED (MISCELLANEOUS) ×2 IMPLANT
SLING ARM FOAM STRAP LRG (SOFTGOODS) IMPLANT
SLING ARM IMMOBILIZER LRG (SOFTGOODS) ×2 IMPLANT
SLING ULTRA II MEDIUM (SOFTGOODS) IMPLANT
SLING ULTRA II SMALL (SOFTGOODS) IMPLANT
SPONGE LAP 4X18 RFD (DISPOSABLE) IMPLANT
STRIP CLOSURE SKIN 1/2X4 (GAUZE/BANDAGES/DRESSINGS) IMPLANT
SUCTION FRAZIER HANDLE 10FR (MISCELLANEOUS)
SUCTION TUBE FRAZIER 10FR DISP (MISCELLANEOUS) IMPLANT
SUT BONE WAX W31G (SUTURE) IMPLANT
SUT ETHILON 3 0 PS 1 (SUTURE) ×2 IMPLANT
SUT FIBERWIRE #2 38 T-5 BLUE (SUTURE)
SUT MNCRL AB 3-0 PS2 18 (SUTURE) IMPLANT
SUT TICRON 1 T 12 (SUTURE) IMPLANT
SUT TIGER TAPE 7 IN WHITE (SUTURE) IMPLANT
SUT VIC AB 0 CT1 27 (SUTURE)
SUT VIC AB 0 CT1 27XBRD ANBCTR (SUTURE) IMPLANT
SUT VIC AB 1 CT1 27 (SUTURE)
SUT VIC AB 1 CT1 27XBRD ANBCTR (SUTURE) IMPLANT
SUT VIC AB 2-0 SH 27 (SUTURE)
SUT VIC AB 2-0 SH 27XBRD (SUTURE) IMPLANT
SUTURE FIBERWR #2 38 T-5 BLUE (SUTURE) IMPLANT
TAPE FIBER 2MM 7IN #2 BLUE (SUTURE) IMPLANT
TOWEL GREEN STERILE FF (TOWEL DISPOSABLE) ×2 IMPLANT
TUBING ARTHROSCOPY IRRIG 16FT (MISCELLANEOUS) ×2 IMPLANT
WATER STERILE IRR 1000ML POUR (IV SOLUTION) ×2 IMPLANT
YANKAUER SUCT BULB TIP NO VENT (SUCTIONS) IMPLANT

## 2019-03-01 NOTE — Progress Notes (Signed)
Assisted Dr. Howze with left, ultrasound guided, interscalene  block. Side rails up, monitors on throughout procedure. See vital signs in flow sheet. Tolerated Procedure well. 

## 2019-03-01 NOTE — Anesthesia Procedure Notes (Signed)
Anesthesia Regional Block: Interscalene brachial plexus block   Pre-Anesthetic Checklist: ,, timeout performed, Correct Patient, Correct Site, Correct Laterality, Correct Procedure, Correct Position, site marked, Risks and benefits discussed, pre-op evaluation,  At surgeon's request and post-op pain management  Laterality: Left  Prep: Maximum Sterile Barrier Precautions used, chloraprep       Needles:  Injection technique: Single-shot  Needle Type: Echogenic Stimulator Needle     Needle Length: 9cm  Needle Gauge: 22     Additional Needles:   Procedures:,,,, ultrasound used (permanent image in chart),,,,  Narrative:  Start time: 03/01/2019 11:30 AM End time: 03/01/2019 11:32 AM Injection made incrementally with aspirations every 5 mL.  Performed by: Personally  Anesthesiologist: Brennan Bailey, MD  Additional Notes: Risks, benefits, and alternative discussed. Patient gave consent for procedure. Patient prepped and draped in sterile fashion. Sedation administered, patient remains easily responsive to voice. Relevant anatomy identified with ultrasound guidance. Local anesthetic given in 5cc increments with no signs or symptoms of intravascular injection. No pain or paraesthesias with injection. Patient monitored throughout procedure with signs of LAST or immediate complications. Tolerated well. Ultrasound image placed in chart.  [name], MD

## 2019-03-01 NOTE — Interval H&P Note (Signed)
History and Physical Interval Note:  03/01/2019 12:28 PM  Travis Wolfe  has presented today for surgery, with the diagnosis of LEFT SHOULDER CARTILAGE DISORDERS, [place].  The various methods of treatment have been discussed with the patient and family. After consideration of risks, benefits and other options for treatment, the patient has consented to  Procedure(s) with comments: LEFT SHOULDER ARTHROSCOPY DISTAL CLAVICLE EXCISION, SUBACROMIAL DECOMPRESSION (Left) - PRE/POST OP SCALENE as a surgical intervention.  The patient's history has been reviewed, patient examined, no change in status, stable for surgery.  I have reviewed the patient's chart and labs.  Questions were answered to the patient's satisfaction.     Yvette Rack

## 2019-03-01 NOTE — Anesthesia Postprocedure Evaluation (Signed)
Anesthesia Post Note  Patient: CAMPBELL KRAY  Procedure(s) Performed: LEFT SHOULDER ARTHROSCOPY DISTAL CLAVICLE EXCISION, SUBACROMIAL DECOMPRESSION (Left Shoulder)     Patient location during evaluation: PACU Anesthesia Type: General Level of consciousness: awake and alert and oriented Pain management: pain level controlled Vital Signs Assessment: post-procedure vital signs reviewed and stable Respiratory status: spontaneous breathing, nonlabored ventilation and respiratory function stable Cardiovascular status: blood pressure returned to baseline Postop Assessment: no apparent nausea or vomiting Anesthetic complications: no    Last Vitals:  Vitals:   03/01/19 1415 03/01/19 1430  BP: (!) 168/92 (!) 151/111  Pulse:  (!) 58  Resp: 11 12  Temp:    SpO2:  100%    Last Pain:  Vitals:   03/01/19 1430  TempSrc:   PainSc: 0-No pain                 Brennan Bailey

## 2019-03-01 NOTE — Discharge Instructions (Signed)
Diet: As you were doing prior to hospitalization   Activity: Increase activity slowly as tolerated  No lifting or driving while in sling  Shower: May shower without a dressing on post op day #3, NO SOAKING in tub   Dressing: You may change your dressing on post op day #3.  Then change the dressing daily with sterile 4"x4"s gauze dressing  Or band aids.  Weight Bearing: weight bearing as tolerated  To prevent constipation: you may use a stool softener such as -  Colace ( over the counter) 100 mg by mouth twice a day  Drink plenty of fluids ( prune juice may be helpful) and high fiber foods  Miralax ( over the counter) for constipation as needed.   Precautions: If you experience chest pain or shortness of breath - call 911 immediately For transfer to the hospital emergency department!!  If you develop a fever greater that 101 F, purulent drainage from wound, increased redness or drainage from wound, or calf pain -- Call the office   Follow- Up Appointment: Please call for an appointment to be seen in 1 week or as previously scheduled  [place] - [phone]    No tylenol until after 5pm today.   Post Anesthesia Home Care Instructions  Activity: Get plenty of rest for the remainder of the day. A responsible individual must stay with you for 24 hours following the procedure.  For the next 24 hours, DO NOT: -Drive a car -Advertising copywriterperate machinery -Drink alcoholic beverages -Take any medication unless instructed by your physician -Make any legal decisions or sign important papers.  Meals: Start with liquid foods such as gelatin or soup. Progress to regular foods as tolerated. Avoid greasy, spicy, heavy foods. If nausea and/or vomiting occur, drink only clear liquids until the nausea and/or vomiting subsides. Call your physician if vomiting continues.  Special Instructions/Symptoms: Your throat may feel dry or sore from the anesthesia or the breathing tube placed in your throat  during surgery. If this causes discomfort, gargle with warm salt water. The discomfort should disappear within 24 hours.  If you had a scopolamine patch placed behind your ear for the management of post- operative nausea and/or vomiting:  1. The medication in the patch is effective for 72 hours, after which it should be removed.  Wrap patch in a tissue and discard in the trash. Wash hands thoroughly with soap and water. 2. You may remove the patch earlier than 72 hours if you experience unpleasant side effects which may include dry mouth, dizziness or visual disturbances. 3. Avoid touching the patch. Wash your hands with soap and water after contact with the patch.     Regional Anesthesia Blocks  1. Numbness or the inability to move the "blocked" extremity may last from 3-48 hours after placement. The length of time depends on the medication injected and your individual response to the medication. If the numbness is not going away after 48 hours, call your surgeon.  2. The extremity that is blocked will need to be protected until the numbness is gone and the  Strength has returned. Because you cannot feel it, you will need to take extra care to avoid injury. Because it may be weak, you may have difficulty moving it or using it. You may not know what position it is in without looking at it while the block is in effect.  3. For blocks in the legs and feet, returning to weight bearing and walking needs to be done carefully. You  will need to wait until the numbness is entirely gone and the strength has returned. You should be able to move your leg and foot normally before you try and bear weight or walk. You will need someone to be with you when you first try to ensure you do not fall and possibly risk injury.  4. Bruising and tenderness at the needle site are common side effects and will resolve in a few days.  5. Persistent numbness or new problems with movement should be communicated to the surgeon  or the [place] [phone] [place] [phone]).    Information for Discharge Teaching: EXPAREL (bupivacaine liposome injectable suspension)   Your surgeon or anesthesiologist gave you EXPAREL(bupivacaine) to help control your pain after surgery.   EXPAREL is a local anesthetic that provides pain relief by numbing the tissue around the surgical site.  EXPAREL is designed to release pain medication over time and can control pain for up to 72 hours.  Depending on how you respond to EXPAREL, you may require less pain medication during your recovery.  Possible side effects:  Temporary loss of sensation or ability to move in the area where bupivacaine was injected.  Nausea, vomiting, constipation  Rarely, numbness and tingling in your mouth or lips, lightheadedness, or anxiety may occur.  Call your doctor right away if you think you may be experiencing any of these sensations, or if you have other questions regarding possible side effects.  Follow all other discharge instructions given to you by your surgeon or nurse. Eat a healthy diet and drink plenty of water or other fluids.  If you return to the hospital for any reason within 96 hours following the administration of EXPAREL, it is important for health care providers to know that you have received this anesthetic. A teal colored band has been placed on your arm with the date, time and amount of EXPAREL you have received in order to alert and inform your health care providers. Please leave this armband in place for the full 96 hours following administration, and then you may remove the band.

## 2019-03-01 NOTE — Anesthesia Preprocedure Evaluation (Addendum)
Anesthesia Evaluation  Patient identified by MRN, date of birth, ID band Patient awake    Reviewed: Allergy & Precautions, NPO status , Patient's Chart, lab work & pertinent test results  History of Anesthesia Complications Negative for: history of anesthetic complications  Airway Mallampati: II  TM Distance: >3 FB Neck ROM: Full    Dental no notable dental hx.    Pulmonary neg pulmonary ROS,    Pulmonary exam normal        Cardiovascular hypertension, Pt. on medications Normal cardiovascular exam+ dysrhythmias (on Eliquis) Atrial Fibrillation      Neuro/Psych negative neurological ROS  negative psych ROS   GI/Hepatic Neg liver ROS, GERD  Medicated and Controlled,  Endo/Other  negative endocrine ROS  Renal/GU negative Renal ROS  negative genitourinary   Musculoskeletal  (+) Arthritis ,   Abdominal   Peds  Hematology negative hematology ROS (+)   Anesthesia Other Findings Day of surgery medications reviewed with patient.  Reproductive/Obstetrics negative OB ROS                            Anesthesia Physical Anesthesia Plan  ASA: II  Anesthesia Plan: General   Post-op Pain Management: GA combined w/ Regional for post-op pain   Induction: Intravenous  PONV Risk Score and Plan: 2 and Treatment may vary due to age or medical condition, Ondansetron and Midazolam  Airway Management Planned: Oral ETT  Additional Equipment:   Intra-op Plan:   Post-operative Plan: Extubation in OR  Informed Consent: I have reviewed the patients History and Physical, chart, labs and discussed the procedure including the risks, benefits and alternatives for the proposed anesthesia with the patient or authorized representative who has indicated his/her understanding and acceptance.     Dental advisory given  Plan Discussed with: CRNA  Anesthesia Plan Comments:       Anesthesia Quick  Evaluation

## 2019-03-01 NOTE — Transfer of Care (Signed)
Immediate Anesthesia Transfer of Care Note  Patient: Travis Wolfe  Procedure(s) Performed: LEFT SHOULDER ARTHROSCOPY DISTAL CLAVICLE EXCISION, SUBACROMIAL DECOMPRESSION (Left Shoulder)  Patient Location: PACU  Anesthesia Type:GA combined with regional for post-op pain  Level of Consciousness: awake, alert  and oriented  Airway & Oxygen Therapy: Patient Spontanous Breathing and Patient connected to nasal cannula oxygen  Post-op Assessment: Report given to RN and Post -op Vital signs reviewed and stable  Post vital signs: Reviewed and stable  Last Vitals:  Vitals Value Taken Time  BP 155/90 03/01/19 1406  Temp    Pulse 72 03/01/19 1407  Resp 16 03/01/19 1408  SpO2 79 % 03/01/19 1407  Vitals shown include unvalidated device data.  Last Pain:  Vitals:   03/01/19 1103  TempSrc: Oral  PainSc: 0-No pain         Complications: No apparent anesthesia complications

## 2019-03-01 NOTE — Anesthesia Procedure Notes (Addendum)
Procedure Name: Intubation Date/Time: 03/01/2019 12:48 PM Performed by: Maryella Shivers, CRNA Pre-anesthesia Checklist: Patient identified, Emergency Drugs available, Suction available and Patient being monitored Patient Re-evaluated:Patient Re-evaluated prior to induction Oxygen Delivery Method: Circle system utilized Preoxygenation: Pre-oxygenation with 100% oxygen Induction Type: IV induction Ventilation: Mask ventilation without difficulty Laryngoscope Size: Mac and 3 Grade View: Grade I Tube type: Oral Tube size: 8.0 mm Number of attempts: 1 Airway Equipment and Method: Stylet and Oral airway Placement Confirmation: ETT inserted through vocal cords under direct vision,  positive ETCO2 and breath sounds checked- equal and bilateral Secured at: 20 cm Tube secured with: Tape Dental Injury: Teeth and Oropharynx as per pre-operative assessment

## 2019-03-02 NOTE — Op Note (Signed)
NAME: Travis Wolfe, [place] QR:[id number] ACCOUNT [id number] DATE OF BIRTH:Nov 19, 1934 FACILITY: [facility] LOCATION: [facility] PHYSICIAN:W. Arvil Utz JR., MD  OPERATIVE REPORT  DATE OF PROCEDURE:  03/01/2019  PREOPERATIVE DIAGNOSES: 1.  Partial rotator cuff tear. 2.  Osteoarthritis. 3.  Impingement. 4.   Acromioclavicular joint arthritis, all for the left shoulder.  OPERATION: 1.  Arthroscopic debridement (extensive). 2.  Arthroscopic acromioplasty. 3.  Arthroscopic distal clavicle excision, all for the left shoulder.  DESCRIPTION OF PROCEDURE:  Under anesthesia, the patient had moderate loss of external rotation, having only 30-40 degrees with abduction to about 120.  He was arthroscoped to posterior lateral and anterior portal.  Systematic inspection of the  glenohumeral joint showed the patient to have grade III chondromalacia central glenoid with some grade II ceramic, extensive degenerative tearing in the anterior, superior, and posterior labrum, debrided.  Subscapularis was intact.  Full width  partial-thickness tear of the supraspinatus estimated at about 30% to 40% debrided.  No full thickness component was appreciated.  Again, the loss of motion was described to the glenohumeral degenerative change, and extensive intraarticular debridement  was carried out.  Subacromial space was hypertrophied and inflamed with a bursectomy carried out.  Mild impingement from the CA ligament was noted.  CA ligament release and acromioplasty was accomplished.  No full-thickness component of the rotator cuff  tear was appreciated.  Moderately severe AC joint arthritis led to the excision of about a distal centimeter of the clavicle.  Again, extensive bursectomy, CA ligament release, subacromial decompression, and distal clavicle excision, all carried out  arthroscopically.  Shoulder was drained of fluid.  Portals were closed with nylon.  Infiltrated the joint with 80 mg of Depo-Medrol  and 4-5 mL of Marcaine.  Taken to recovery room in stable condition.  LN/NUANCE  D:03/01/2019 T:03/02/2019 JOB:[id number]

## 2020-04-12 IMAGING — MR MR FEMUR*R* WO/W CM
5 of 9 series · 25 of 40 positions shown · IV contrast (multihance)
Comparison: MRI 01/09/2017

CLINICAL DATA: Right thigh pain and swelling.

EXAM:
MRI OF THE RIGHT FEMUR WITHOUT AND WITH CONTRAST
TECHNIQUE: Multiplanar, multisequence MR imaging of the right thigh was
performed both before and after administration of intravenous
contrast.
CONTRAST:  14mL MULTIHANCE GADOBENATE DIMEGLUMINE 529 MG/ML IV SOLN

[Series 6: composed cor stir_comp_filt · coronal · right · 5.0mm · 1.04mm/px · 4 of 36 slices shown]
[im 1/36]
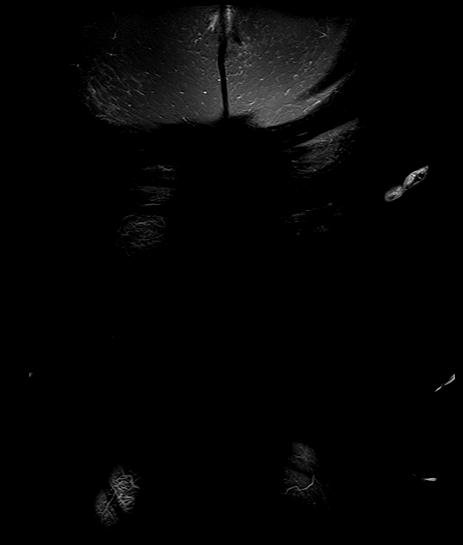
[im 12/36]
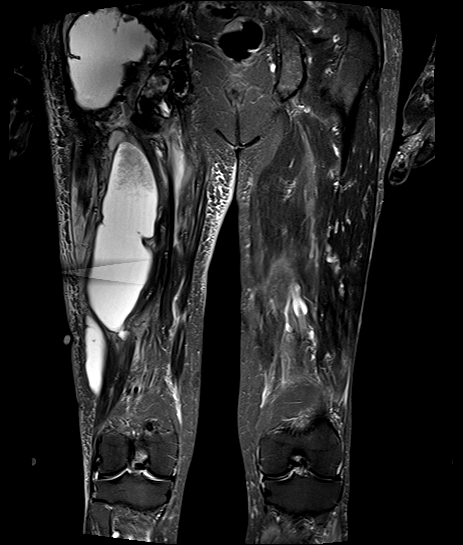
[im 24/36]
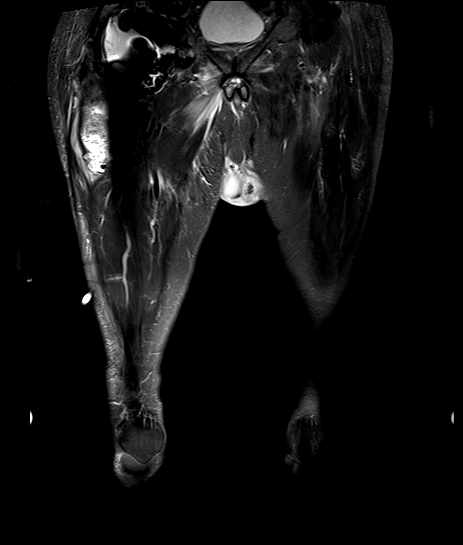
[im 36/36]
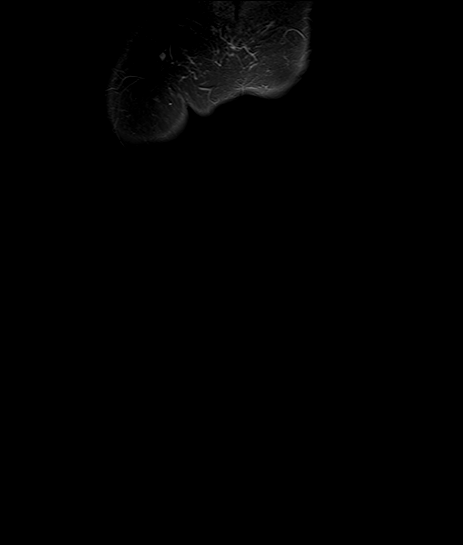

[Series 10: composed cor t1_comp_filt · coronal · right · 5.0mm · 0.72mm/px · 3 of 36 slices shown]
[im 1/36]
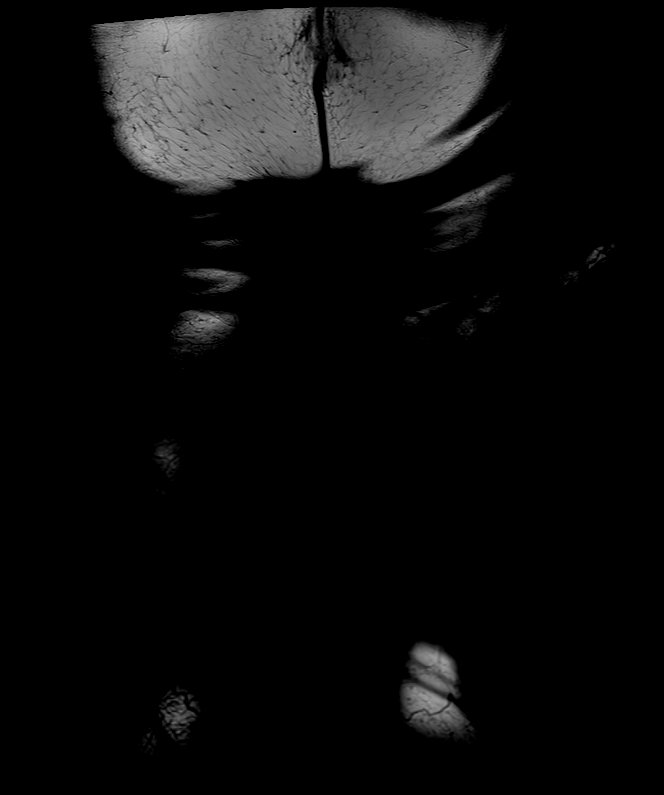
[im 18/36]
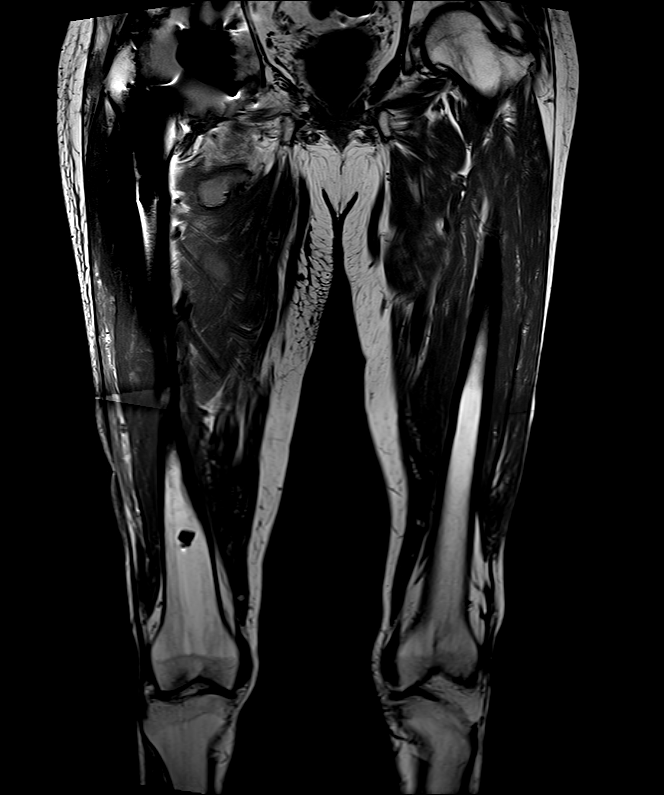
[im 36/36]
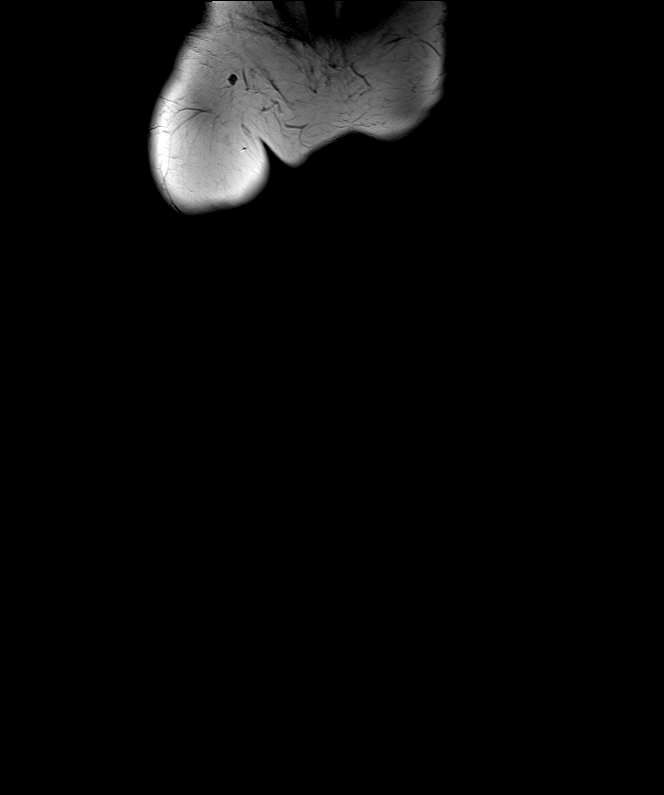

[Series 13: T2 · axial · right · 5.0mm · 0.78mm/px · z∈[-180,+84]mm · 4 of 86 slices shown]
[im 1/86]
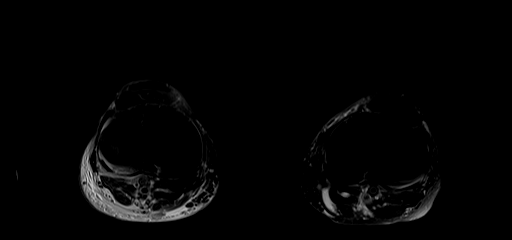
[im 15/86]
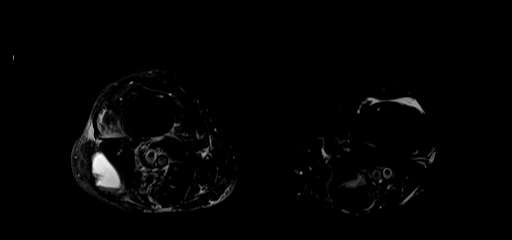
[im 29/86]
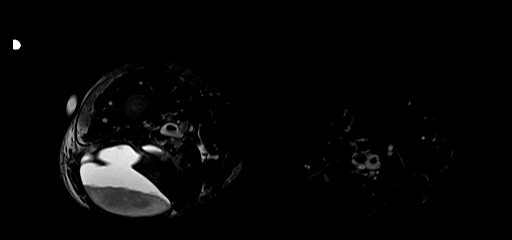
[im 43/86]
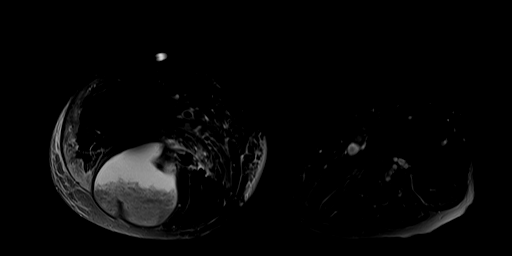

[Series 16: T1 · axial · right · 5.0mm · 0.78mm/px · z∈[-180,+342]mm · 7 of 88 slices shown (1 of 2)]
[im 1/88]
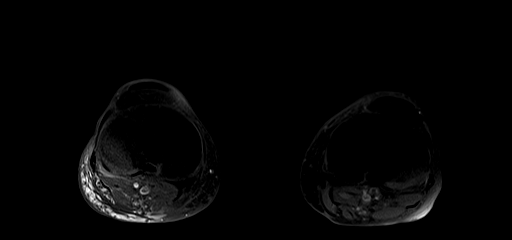
[im 15/88]
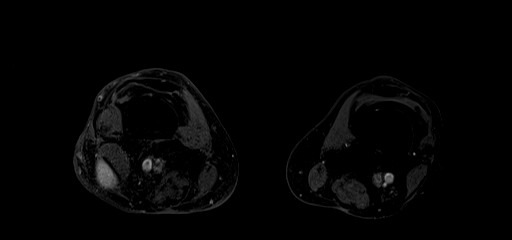
[im 30/88]
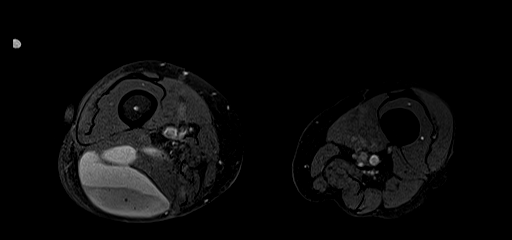
[im 44/88]
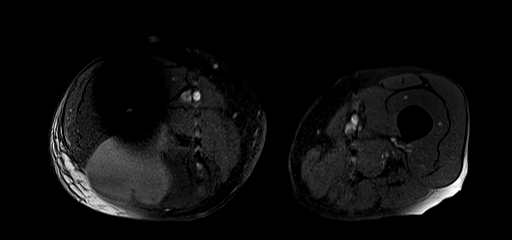
[im 59/88]
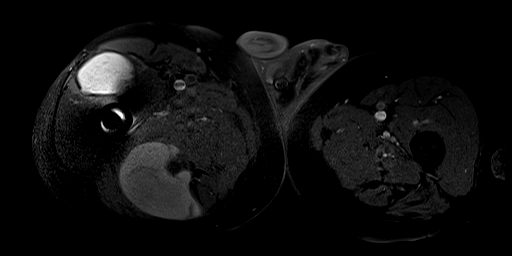
[im 73/88]
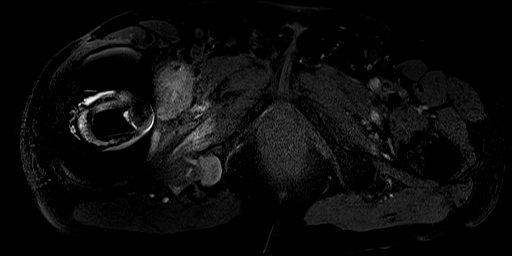
[im 88/88]
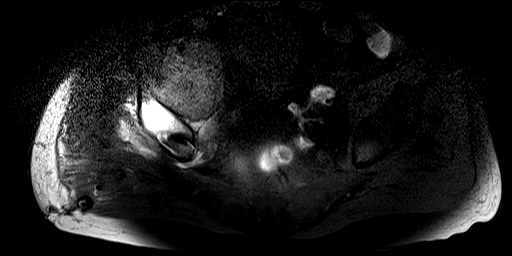

[Series 27: T1 · axial · right · 5.0mm · 0.78mm/px · z∈[-180,+348]mm · 7 of 88 slices shown (2 of 2)]
[im 1/88]
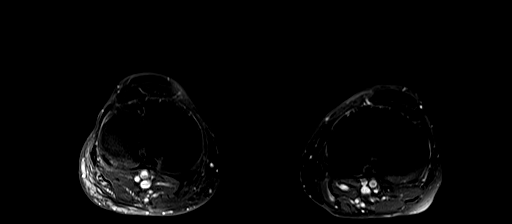
[im 15/88]
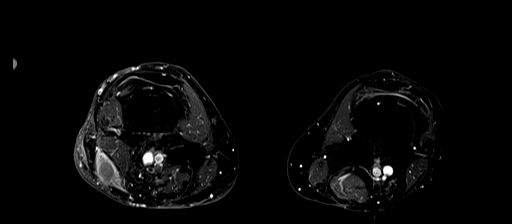
[im 30/88]
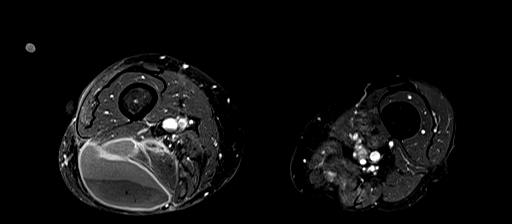
[im 44/88]
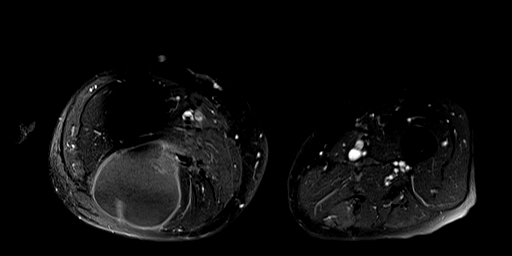
[im 59/88]
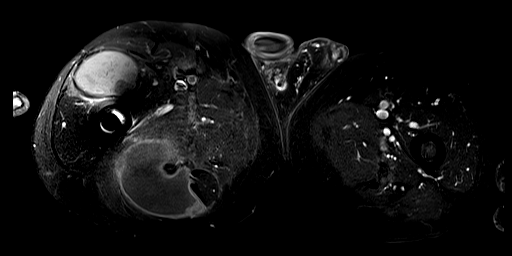
[im 73/88]
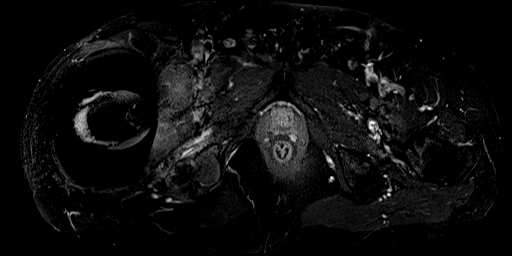
[im 88/88]
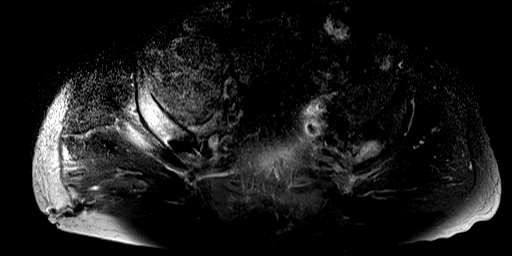

[25 of 40 positions shown; findings below may reference images not displayed]

FINDINGS: Long stem redo right total hip arthroplasty appears stable. No
findings for periprosthetic fracture or obvious changes of
osteomyelitis.

Stable large complex joint effusion and periarticular fluid most
consistent with metallosis. Persistent layering inflammatory debris
within the complex fluid. There is also a stable more solid
appearing masslike component posteriorly involving the ischial
tuberosity.

There is a new large complex fluid collection extending down the
posterior compartment of the thigh which demonstrates a fluid fluid
level and rim-like enhancement. This has increased T1 signal
intensity. It could be a hematoma or progression of the metal low
cyst down into the thigh. No obvious gas is demonstrated to suggest
an abscess. No surrounding inflammatory changes in the hamstring
muscles. There is mild edema like signal abnormality in the
superficial aspect of the vastus lateralis muscle. This fluid
collection is 30 cm long and 7 cm wide.
IMPRESSION: 1. Large complex articular and periarticular fluid collection,
relatively stable over the past year and most likely metallosis
associated with the total right hip arthroplasty.
2. Stable more solid appearing pseudotumor involving the ischium and
adjacent musculature.
3. New large fluid collection extending down the posterior
compartment of the thigh down to just above the level of the knee.
This could be a continuation of the metallosis process or more
likely a large hematoma with fluid fluid level. Abscess cannot be
totally excluded but appears less likely.

## 2020-10-30 ENCOUNTER — Other Ambulatory Visit: Payer: Self-pay | Admitting: Orthopedic Surgery

## 2020-10-30 DIAGNOSIS — Z96641 Presence of right artificial hip joint: Secondary | ICD-10-CM

## 2020-11-08 ENCOUNTER — Other Ambulatory Visit: Payer: Medicare Other

## 2020-11-09 ENCOUNTER — Other Ambulatory Visit: Payer: Medicare Other

## 2021-03-21 ENCOUNTER — Other Ambulatory Visit: Payer: Self-pay | Admitting: Orthopedic Surgery

## 2021-03-21 DIAGNOSIS — Z96641 Presence of right artificial hip joint: Secondary | ICD-10-CM

## 2021-04-04 ENCOUNTER — Ambulatory Visit
Admission: RE | Admit: 2021-04-04 | Discharge: 2021-04-04 | Disposition: A | Discharge status: Home / Self Care | Payer: Medicare Other | Source: Ambulatory Visit | Attending: Orthopedic Surgery | Admitting: Orthopedic Surgery

## 2021-04-04 ENCOUNTER — Other Ambulatory Visit: Payer: Self-pay

## 2021-04-04 DIAGNOSIS — Z96641 Presence of right artificial hip joint: Secondary | ICD-10-CM

## 2021-05-08 NOTE — Progress Notes (Signed)
Please enter surgery orders for PAT visit scheduled for 11/30

## 2021-05-14 NOTE — Progress Notes (Addendum)
Anesthesia Review:  PCP: DR Daneil Dolin  Cardiologist : DR Rudolpho Sevin- 04/24/21- LOV on chart  Have requested most recent ov note, ekg, stress and echo by fax.  Chest x-ray : EKG :04/22/21- Dr Rudolpho Sevin- on chart  , EKG- 05/16/21  Echo : 09/23/2017 on chart and 01/04/21 on chart  Stress test: Cardiac Cath :  Activity level:   Sleep Study/ CPAP : none  Fasting Blood Sugar :      / Checks Blood Sugar -- times a day:   Blood Thinner/ Instructions /Last Dose: ASA / Instructions/ Last Dose :   Xarelto  04/28/21- [facility] for sore throat covid negative  04/30/21- not better  11/30/.22- pt states sore throat is no more  Covid test day of surgery on 05/17/2021  Retired [employer] and [employer]  Xarelto- stop 4 days prior per pt  PT was 25 minutes late for preop appt.  Completed medical and surgical hx along with sleep apnea and travel screening questions.  Completed preop instructions.

## 2021-05-16 ENCOUNTER — Other Ambulatory Visit: Payer: Self-pay

## 2021-05-16 ENCOUNTER — Encounter (HOSPITAL_COMMUNITY)
Admission: RE | Admit: 2021-05-16 | Discharge: 2021-05-16 | Disposition: A | Discharge status: Home / Self Care | Payer: Medicare Other | Source: Ambulatory Visit | Attending: Orthopedic Surgery | Admitting: Orthopedic Surgery

## 2021-05-16 ENCOUNTER — Encounter (HOSPITAL_COMMUNITY): Payer: Self-pay

## 2021-05-16 VITALS — BP 133/82 | HR 100 | Temp 98.2°F | Resp 16 | Ht 68.0 in | Wt 144.0 lb

## 2021-05-16 DIAGNOSIS — I10 Essential (primary) hypertension: Secondary | ICD-10-CM | POA: Diagnosis not present

## 2021-05-16 DIAGNOSIS — T8489XA Other specified complication of internal orthopedic prosthetic devices, implants and grafts, initial encounter: Secondary | ICD-10-CM | POA: Insufficient documentation

## 2021-05-16 DIAGNOSIS — N400 Enlarged prostate without lower urinary tract symptoms: Secondary | ICD-10-CM | POA: Diagnosis not present

## 2021-05-16 DIAGNOSIS — Z7901 Long term (current) use of anticoagulants: Secondary | ICD-10-CM | POA: Diagnosis not present

## 2021-05-16 DIAGNOSIS — Z96641 Presence of right artificial hip joint: Secondary | ICD-10-CM | POA: Diagnosis not present

## 2021-05-16 DIAGNOSIS — Y838 Other surgical procedures as the cause of abnormal reaction of the patient, or of later complication, without mention of misadventure at the time of the procedure: Secondary | ICD-10-CM | POA: Insufficient documentation

## 2021-05-16 DIAGNOSIS — K219 Gastro-esophageal reflux disease without esophagitis: Secondary | ICD-10-CM | POA: Diagnosis not present

## 2021-05-16 DIAGNOSIS — I4891 Unspecified atrial fibrillation: Secondary | ICD-10-CM | POA: Diagnosis not present

## 2021-05-16 DIAGNOSIS — I480 Paroxysmal atrial fibrillation: Secondary | ICD-10-CM

## 2021-05-16 DIAGNOSIS — Z01818 Encounter for other preprocedural examination: Secondary | ICD-10-CM | POA: Insufficient documentation

## 2021-05-16 LAB — CBC
HCT: 40.8 % (ref 39.0–52.0)
Hemoglobin: 13.1 g/dL (ref 13.0–17.0)
MCH: 31.5 pg (ref 26.0–34.0)
MCHC: 32.1 g/dL (ref 30.0–36.0)
MCV: 98.1 fL (ref 80.0–100.0)
Platelets: 143 10*3/uL — ABNORMAL LOW (ref 150–400)
RBC: 4.16 MIL/uL — ABNORMAL LOW (ref 4.22–5.81)
RDW: 15 % (ref 11.5–15.5)
WBC: 7.1 10*3/uL (ref 4.0–10.5)
nRBC: 0 % (ref 0.0–0.2)

## 2021-05-16 LAB — COMPREHENSIVE METABOLIC PANEL
ALT: 28 U/L (ref 0–44)
AST: 36 U/L (ref 15–41)
Albumin: 4.2 g/dL (ref 3.5–5.0)
Alkaline Phosphatase: 76 U/L (ref 38–126)
Anion gap: 3 — ABNORMAL LOW (ref 5–15)
BUN: 21 mg/dL (ref 8–23)
CO2: 27 mmol/L (ref 22–32)
Calcium: 9.3 mg/dL (ref 8.9–10.3)
Chloride: 109 mmol/L (ref 98–111)
Creatinine, Ser: 0.99 mg/dL (ref 0.61–1.24)
GFR, Estimated: >60 mL/min (ref >60)
Glucose, Bld: 99 mg/dL (ref 70–99)
Potassium: 4.6 mmol/L (ref 3.5–5.1)
Sodium: 139 mmol/L (ref 135–145)
Total Bilirubin: 1.6 mg/dL — ABNORMAL HIGH (ref 0.3–1.2)
Total Protein: 6.8 g/dL (ref 6.5–8.1)

## 2021-05-16 NOTE — Progress Notes (Signed)
Anesthesia Chart Review   Case: [id number] Date/Time: 05/17/21 1115   Procedure: IRRIGATION AND DEBRIDEMENT RIGHT HIP, EVACUATION OF PSEUDOTUMOR (Right: Hip)   Anesthesia type: Spinal   Pre-op diagnosis: Right hip metallosis, status post revision right total hip   Location: [facility] ROOM 10 / [facility]   Surgeons: Durene Romans, MD       DISCUSSION:86 y.o. never smoker with h/o HTN, GERD, a-fib, BPH, right hip metallosis scheduled for above procedure 05/17/2021 with Dr. Durene Romans.   Pt reports he was advised to hold Xarelto 4 days prior to surgery.   Last seen by cardiology 04/24/2021. Cardiology planning for cardioversion in the future.  A-fib rate controlled on EKG done at PAT visit.   Anticipate pt can proceed with planned procedure barring acute status change.   VS: BP 133/82   Pulse 100   Temp 36.8 C (Oral)   Resp 16   Ht 5\' 8"  (1.727 m)   Wt 65.3 kg   SpO2 96%   BMI 21.90 kg/m   PROVIDERS: Elijio Miles., MD is PCP   Sandy Salaam, MD is Cardiologist  LABS: Labs reviewed: Acceptable for surgery. (all labs ordered are listed, but only abnormal results are displayed)  Labs Reviewed  CBC - Abnormal; Notable for the following components:      Result Value   RBC 4.16 (*)    Platelets 143 (*)    All other components within normal limits  COMPREHENSIVE METABOLIC PANEL - Abnormal; Notable for the following components:   Total Bilirubin 1.6 (*)    Anion gap 3 (*)    All other components within normal limits  TYPE AND SCREEN     IMAGES:   EKG: 05/16/2021 Rate 81 bpm  Atrial fibrillation    CV: Echo 03/16/2021 SUMMARY   The left ventricular size is normal. Upper septal hypertrophy (sigmoid  septum), normal variant. There is mild concentric left ventricular  hypertrophy with normal wall motion, normal systolic function and  ejection fraction '55-60%'.  Left ventricular filling pattern and Left atrial pressure are  indeterminate due to 'atrial fibrillation'  The  right ventricle is normal in size and function.  The atria are severely dilated.  There is moderate to severe mitral regurgitation [annual dilation and  prolapse].  There is moderately severe tricuspid regurgitation.  Moderate to severe pulmonary hypertension.  Estimated right ventricular systolic pressure is 64 mmHg assuming an  RAP of 15 mm Hg.Marland Kitchen  Past Medical History:  Diagnosis Date   A-fib (HCC)    REPORTS WAS DUE TO DEHYDRATION 3 YEARS AGO BUT WAS GIVEN FLUIDS IN ED AND MEDS TO SLOW HR AND DROVE HIMSELF HOME  ; HAD ABLATION X2 WITH CARDIO DR. AKBARY AS PRECAUTION  ; has been on eliquis since but reports he has not taken it for 5 weeks b/c he was told to hold it for the multiple irrigations and debridements for the hip infection. reports his cardiologist is aware he has been holding it;    Arthritis    Atherosclerosis of both carotid arteries    BPH (benign prostatic hyperplasia)    Dysrhythmia    a-fib   Fall    fell on friday 01-16-18;   lost balAnce whIile tryng to get his sock on ;sustained skin tear to left wrist ; no drainage  , scabbed over , and has been covering with bandage    GERD (gastroesophageal reflux disease)    Heart murmur    SINCE HIS 20s  HLD (hyperlipidemia)    Hypertension    Non-rheumatic mitral regurgitation    PVC (premature ventricular contraction)     Past Surgical History:  Procedure Laterality Date   BACK SURGERY  2007   L1-L5 LUMBAR WITH HARDWARE PLACED   CARDIOVERSION     CATARACT EXTRACTION, BILATERAL  2015   CRYOABLATION     CARDIAC ABLATION    DACROCYSTORHINOSTOMY  07/26/2011   EYE SURGERY     HERNIA REPAIR Bilateral    hx of echocardiogram      INCISION AND DRAINAGE HIP Right 01/26/2018   Procedure: Right hip irrigation and debridement, excisional and non excisional debridement, head liner exchange, posterior approach;  Surgeon: Durene Romans, MD;  Location: [facility];  Service: Orthopedics;  Laterality: Right;  90 mins Would like to start  earlier around 2:00pm if time opens   INCISION AND DRAINAGE HIP Right 11/26/2018   Procedure: IRRIGATION AND DEBRIDEMENT HIP;  Surgeon: Durene Romans, MD;  Location: [facility];  Service: Orthopedics;  Laterality: Right;  90 mins   INGUINAL HERNIA REPAIR Bilateral    with mesh    IR US GUIDE BX ASP/DRAIN  12/17/2017   IRRIGATION AND ASPIRATION RIGHT HIP   12/10/2017   [facility]  x2, [facility] x1    KIDNEY STONE SURGERY     lithotripsy    KYPHOPLASTY     T7   LATERAL FUSION LUMBAR SPINE     L1-L5   PULMONARY VEIN ISOLATION AND LEFT ATRIAL ROOFLINE ABLATION   10/15/2017   DR Rudolpho Sevin    ROTATOR CUFF REPAIR Left    SHOULDER DEBRIDEMENT Right    SHOULDER OPEN ROTATOR CUFF REPAIR Left    SHOULDER SURGERY  1988   arthroscopy     TONSILLECTOMY     TOTAL HIP ARTHROPLASTY     done twice   TOTAL HIP ARTHROPLASTY  2010   TOTAL HIP REVISION      05-2017, 10-2016    MEDICATIONS:  acetaminophen (TYLENOL) 500 MG tablet   amiodarone (PACERONE) 200 MG tablet   atorvastatin (LIPITOR) 40 MG tablet   Biotin (BIOTIN 5000) 5 MG CAPS   brimonidine (ALPHAGAN) 0.15 % ophthalmic solution   Cholecalciferol (VITAMIN D) 50 MCG (2000 UT) tablet   ciclopirox (PENLAC) 8 % solution   COSOPT PF 22.3-6.8 MG/ML SOLN ophthalmic solution   Eszopiclone 3 MG TABS   Ferrous Sulfate (SLOW RELEASE IRON PO)   ipratropium (ATROVENT) 0.06 % nasal spray   Lutein 20 MG TABS   LYCOPENE PO   Multiple Minerals (CALCIUM-MAGNESIUM-ZINC) TABS   pantoprazole (PROTONIX) 40 MG tablet   potassium chloride (K-DUR) 10 MEQ tablet   rivaroxaban (XARELTO) 20 MG TABS tablet   valACYclovir (VALTREX) 1000 MG tablet   zinc gluconate 50 MG tablet   No current facility-administered medications for this encounter.     Jodell Cipro Ward, PA-C [facility] [phone]

## 2021-05-16 NOTE — Progress Notes (Signed)
Your procedure is scheduled on:  05/16/2021   Report to [facility] Main  Entrance   Report to admitting at   0830am      Call this number if you have problems the morning of surgery [phone]    REMEMBER: NO  SOLID FOOD CANDY OR GUM AFTER MIDNIGHT. CLEAR LIQUIDS UNTIL   0830am         . NOTHING BY MOUTH EXCEPT CLEAR LIQUIDS UNTIL    0830am   . PLEASE FINISH ENSURE DRINK PER SURGEON ORDER  WHICH NEEDS TO BE COMPLETED AT   0830am    .      CLEAR LIQUID DIET   Foods Allowed                                                                    Coffee and tea, regular and decaf                            Fruit ices (not with fruit pulp)                                      Iced Popsicles                                    Carbonated beverages, regular and diet                                    Cranberry, grape and apple juices Sports drinks like Gatorade Lightly seasoned clear broth or consume(fat free) Sugar, honey syrup ___________________________________________________________________      BRUSH YOUR TEETH MORNING OF SURGERY AND RINSE YOUR MOUTH OUT, NO CHEWING GUM CANDY OR MINTS.     Take these medicines the morning of surgery with A SIP OF WATER: pacerone, eye drops as usual protonix if needed   DO NOT TAKE ANY DIABETIC MEDICATIONS DAY OF YOUR SURGERY                               You may not have any metal on your body including hair pins and              piercings  Do not wear jewelry, make-up, lotions, powders or perfumes, deodorant             Do not wear nail polish on your fingernails.  Do not shave  48 hours prior to surgery.              Men may shave face and neck.   Do not bring valuables to the hospital. [facility] IS NOT             RESPONSIBLE   FOR VALUABLES.  Contacts, dentures or bridgework may not be worn into surgery.  Leave suitcase in the car. After surgery it may be brought to your room.     Patients discharged the  day  of surgery will not be allowed to drive home. IF YOU ARE HAVING SURGERY AND GOING HOME THE SAME DAY, YOU MUST HAVE AN ADULT TO DRIVE YOU HOME AND BE WITH YOU FOR 24 HOURS. YOU MAY GO HOME BY TAXI OR UBER OR ORTHERWISE, BUT AN ADULT MUST ACCOMPANY YOU HOME AND STAY WITH YOU FOR 24 HOURS.  Name and phone number of your driver:  Special Instructions: N/A              Please read over the following fact sheets you were given: _____________________________________________________________________  [facility] - Preparing for Surgery Before surgery, you can play an important role.  Because skin is not sterile, your skin needs to be as free of germs as possible.  You can reduce the number of germs on your skin by washing with CHG (chlorahexidine gluconate) soap before surgery.  CHG is an antiseptic cleaner which kills germs and bonds with the skin to continue killing germs even after washing. Please DO NOT use if you have an allergy to CHG or antibacterial soaps.  If your skin becomes reddened/irritated stop using the CHG and inform your nurse when you arrive at Short Stay. Do not shave (including legs and underarms) for at least 48 hours prior to the first CHG shower.  You may shave your face/neck. Please follow these instructions carefully:  1.  Shower with CHG Soap the night before surgery and the  morning of Surgery.  2.  If you choose to wash your hair, wash your hair first as usual with your  normal  shampoo.  3.  After you shampoo, rinse your hair and body thoroughly to remove the  shampoo.                           4.  Use CHG as you would any other liquid soap.  You can apply chg directly  to the skin and wash                       Gently with a scrungie or clean washcloth.  5.  Apply the CHG Soap to your body ONLY FROM THE NECK DOWN.   Do not use on face/ open                           Wound or open sores. Avoid contact with eyes, ears mouth and genitals (private parts).                        Wash face,  Genitals (private parts) with your normal soap.             6.  Wash thoroughly, paying special attention to the area where your surgery  will be performed.  7.  Thoroughly rinse your body with warm water from the neck down.  8.  DO NOT shower/wash with your normal soap after using and rinsing off  the CHG Soap.                9.  Pat yourself dry with a clean towel.            10.  Wear clean pajamas.            11.  Place clean sheets on your bed the night of your first shower and do not  sleep with pets. Day of  Surgery : Do not apply any lotions/deodorants the morning of surgery.  Please wear clean clothes to the hospital/surgery center.  FAILURE TO FOLLOW THESE INSTRUCTIONS MAY RESULT IN THE CANCELLATION OF YOUR SURGERY PATIENT SIGNATURE_________________________________  NURSE SIGNATURE__________________________________  ________________________________________________________________________

## 2021-05-17 ENCOUNTER — Encounter (HOSPITAL_COMMUNITY): Payer: Self-pay | Admitting: Orthopedic Surgery

## 2021-05-17 ENCOUNTER — Encounter (HOSPITAL_COMMUNITY)
Admission: RE | Disposition: A | Discharge status: Home / Self Care | Payer: Self-pay | Source: Home / Self Care | Attending: Orthopedic Surgery

## 2021-05-17 ENCOUNTER — Inpatient Hospital Stay (HOSPITAL_COMMUNITY): Payer: Medicare Other | Admitting: Physician Assistant

## 2021-05-17 ENCOUNTER — Inpatient Hospital Stay (HOSPITAL_COMMUNITY): Payer: Medicare Other | Admitting: Certified Registered"

## 2021-05-17 ENCOUNTER — Observation Stay (HOSPITAL_COMMUNITY)
Admission: RE | Admit: 2021-05-17 | Discharge: 2021-05-18 | Disposition: A | Discharge status: Home / Self Care | Payer: Medicare Other | Attending: Orthopedic Surgery | Admitting: Orthopedic Surgery

## 2021-05-17 DIAGNOSIS — Z79899 Other long term (current) drug therapy: Secondary | ICD-10-CM | POA: Diagnosis not present

## 2021-05-17 DIAGNOSIS — T5691XA Toxic effect of unspecified metal, accidental (unintentional), initial encounter: Secondary | ICD-10-CM | POA: Diagnosis present

## 2021-05-17 DIAGNOSIS — Z01818 Encounter for other preprocedural examination: Secondary | ICD-10-CM

## 2021-05-17 DIAGNOSIS — M96830 Postprocedural hemorrhage and hematoma of a musculoskeletal structure following a musculoskeletal system procedure: Secondary | ICD-10-CM | POA: Diagnosis not present

## 2021-05-17 DIAGNOSIS — M25851 Other specified joint disorders, right hip: Secondary | ICD-10-CM | POA: Insufficient documentation

## 2021-05-17 DIAGNOSIS — I10 Essential (primary) hypertension: Secondary | ICD-10-CM | POA: Diagnosis not present

## 2021-05-17 DIAGNOSIS — T8451XA Infection and inflammatory reaction due to internal right hip prosthesis, initial encounter: Secondary | ICD-10-CM | POA: Diagnosis present

## 2021-05-17 DIAGNOSIS — Z20822 Contact with and (suspected) exposure to covid-19: Secondary | ICD-10-CM | POA: Insufficient documentation

## 2021-05-17 DIAGNOSIS — I480 Paroxysmal atrial fibrillation: Secondary | ICD-10-CM

## 2021-05-17 DIAGNOSIS — Z96649 Presence of unspecified artificial hip joint: Secondary | ICD-10-CM

## 2021-05-17 DIAGNOSIS — Z96641 Presence of right artificial hip joint: Secondary | ICD-10-CM | POA: Diagnosis present

## 2021-05-17 DIAGNOSIS — Y792 Prosthetic and other implants, materials and accessory orthopedic devices associated with adverse incidents: Secondary | ICD-10-CM | POA: Insufficient documentation

## 2021-05-17 HISTORY — PX: INCISION AND DRAINAGE HIP: SHX1801

## 2021-05-17 LAB — TYPE AND SCREEN
ABO/RH(D): A POS
Antibody Screen: NEGATIVE

## 2021-05-17 LAB — SARS CORONAVIRUS 2 BY RT PCR (HOSPITAL ORDER, PERFORMED IN ~~LOC~~ HOSPITAL LAB): SARS Coronavirus 2: NEGATIVE

## 2021-05-17 SURGERY — IRRIGATION AND DEBRIDEMENT HIP
Anesthesia: General | Site: Hip | Laterality: Right

## 2021-05-17 MED ORDER — FENTANYL CITRATE (PF) 100 MCG/2ML IJ SOLN
INTRAMUSCULAR | Status: DC | PRN
  Administered 2021-05-17: 25 ug via INTRAVENOUS
  Administered 2021-05-17: 50 ug via INTRAVENOUS
  Administered 2021-05-17: 25 ug via INTRAVENOUS

## 2021-05-17 MED ORDER — HYDROCODONE-ACETAMINOPHEN 5-325 MG PO TABS
1.0000 | ORAL_TABLET | ORAL | Status: DC | PRN
  Administered 2021-05-17 – 2021-05-18 (×2): 1 via ORAL
  Filled 2021-05-17: qty 1

## 2021-05-17 MED ORDER — METHOCARBAMOL 500 MG IVPB - SIMPLE MED
500.0000 mg | Freq: Four times a day (QID) | INTRAVENOUS | Status: DC | PRN
  Administered 2021-05-17: 500 mg via INTRAVENOUS
  Filled 2021-05-17: qty 50

## 2021-05-17 MED ORDER — VALACYCLOVIR HCL 500 MG PO TABS
1000.0000 mg | ORAL_TABLET | Freq: Every day | ORAL | Status: DC | PRN
  Filled 2021-05-17: qty 2

## 2021-05-17 MED ORDER — CHLORHEXIDINE GLUCONATE 0.12 % MT SOLN
15.0000 mL | Freq: Once | OROMUCOSAL | Status: AC
  Administered 2021-05-17: 15 mL via OROMUCOSAL

## 2021-05-17 MED ORDER — PANTOPRAZOLE SODIUM 40 MG PO TBEC
40.0000 mg | DELAYED_RELEASE_TABLET | Freq: Every day | ORAL | Status: DC
  Administered 2021-05-17 – 2021-05-18 (×2): 40 mg via ORAL
  Filled 2021-05-17 (×2): qty 1

## 2021-05-17 MED ORDER — ROCURONIUM BROMIDE 10 MG/ML (PF) SYRINGE
PREFILLED_SYRINGE | INTRAVENOUS | Status: AC
  Filled 2021-05-17: qty 10

## 2021-05-17 MED ORDER — ONDANSETRON HCL 4 MG/2ML IJ SOLN
INTRAMUSCULAR | Status: DC | PRN
  Administered 2021-05-17: 4 mg via INTRAVENOUS

## 2021-05-17 MED ORDER — PHENYLEPHRINE 40 MCG/ML (10ML) SYRINGE FOR IV PUSH (FOR BLOOD PRESSURE SUPPORT)
PREFILLED_SYRINGE | INTRAVENOUS | Status: DC | PRN
  Administered 2021-05-17 (×3): 40 ug via INTRAVENOUS

## 2021-05-17 MED ORDER — LACTATED RINGERS IV SOLN: INTRAVENOUS | Status: DC

## 2021-05-17 MED ORDER — VALACYCLOVIR HCL 500 MG PO TABS: 1000.0000 mg | ORAL_TABLET | Freq: Every day | ORAL | Status: DC | PRN

## 2021-05-17 MED ORDER — ONDANSETRON HCL 4 MG PO TABS: 4.0000 mg | ORAL_TABLET | Freq: Four times a day (QID) | ORAL | Status: DC | PRN

## 2021-05-17 MED ORDER — BRIMONIDINE TARTRATE 0.15 % OP SOLN
1.0000 [drp] | Freq: Every day | OPHTHALMIC | Status: DC
  Administered 2021-05-17: 1 [drp] via OPHTHALMIC
  Filled 2021-05-17: qty 5

## 2021-05-17 MED ORDER — POTASSIUM CHLORIDE CRYS ER 20 MEQ PO TBCR: 20.0000 meq | EXTENDED_RELEASE_TABLET | ORAL | Status: DC

## 2021-05-17 MED ORDER — ROCURONIUM BROMIDE 100 MG/10ML IV SOLN
INTRAVENOUS | Status: DC | PRN
  Administered 2021-05-17: 40 mg via INTRAVENOUS

## 2021-05-17 MED ORDER — FENTANYL CITRATE PF 50 MCG/ML IJ SOSY
PREFILLED_SYRINGE | INTRAMUSCULAR | Status: AC
  Filled 2021-05-17: qty 2

## 2021-05-17 MED ORDER — IPRATROPIUM BROMIDE 0.06 % NA SOLN
2.0000 | Freq: Every day | NASAL | Status: DC | PRN
  Filled 2021-05-17: qty 15

## 2021-05-17 MED ORDER — PROPOFOL 10 MG/ML IV BOLUS
INTRAVENOUS | Status: AC
  Filled 2021-05-17: qty 20

## 2021-05-17 MED ORDER — ATORVASTATIN CALCIUM 40 MG PO TABS
40.0000 mg | ORAL_TABLET | Freq: Every day | ORAL | Status: DC
  Administered 2021-05-17 – 2021-05-18 (×2): 40 mg via ORAL
  Filled 2021-05-17 (×2): qty 1

## 2021-05-17 MED ORDER — SUGAMMADEX SODIUM 200 MG/2ML IV SOLN
INTRAVENOUS | Status: DC | PRN
  Administered 2021-05-17: 200 mg via INTRAVENOUS

## 2021-05-17 MED ORDER — LIDOCAINE 2% (20 MG/ML) 5 ML SYRINGE
INTRAMUSCULAR | Status: DC | PRN
  Administered 2021-05-17: 60 mg via INTRAVENOUS

## 2021-05-17 MED ORDER — ONDANSETRON HCL 4 MG/2ML IJ SOLN
INTRAMUSCULAR | Status: AC
  Filled 2021-05-17: qty 2

## 2021-05-17 MED ORDER — TRANEXAMIC ACID-NACL 1000-0.7 MG/100ML-% IV SOLN
1000.0000 mg | Freq: Once | INTRAVENOUS | Status: AC
  Administered 2021-05-17: 1000 mg via INTRAVENOUS
  Filled 2021-05-17: qty 100

## 2021-05-17 MED ORDER — PROPOFOL 10 MG/ML IV BOLUS
INTRAVENOUS | Status: DC | PRN
  Administered 2021-05-17: 80 mg via INTRAVENOUS

## 2021-05-17 MED ORDER — CEFAZOLIN SODIUM-DEXTROSE 2-4 GM/100ML-% IV SOLN
2.0000 g | Freq: Four times a day (QID) | INTRAVENOUS | Status: AC
  Administered 2021-05-17 (×2): 2 g via INTRAVENOUS
  Filled 2021-05-17 (×2): qty 100

## 2021-05-17 MED ORDER — RIVAROXABAN 10 MG PO TABS: 20.0000 mg | ORAL_TABLET | Freq: Every day | ORAL | Status: DC

## 2021-05-17 MED ORDER — FENTANYL CITRATE (PF) 250 MCG/5ML IJ SOLN
INTRAMUSCULAR | Status: AC
  Filled 2021-05-17: qty 5

## 2021-05-17 MED ORDER — ONDANSETRON HCL 4 MG/2ML IJ SOLN: 4.0000 mg | Freq: Four times a day (QID) | INTRAMUSCULAR | Status: DC | PRN

## 2021-05-17 MED ORDER — DIPHENHYDRAMINE HCL 12.5 MG/5ML PO ELIX: 12.5000 mg | ORAL_SOLUTION | ORAL | Status: DC | PRN

## 2021-05-17 MED ORDER — ACETAMINOPHEN 325 MG PO TABS: 325.0000 mg | ORAL_TABLET | Freq: Four times a day (QID) | ORAL | Status: DC | PRN

## 2021-05-17 MED ORDER — ZOLPIDEM TARTRATE 5 MG PO TABS
5.0000 mg | ORAL_TABLET | Freq: Every evening | ORAL | Status: DC | PRN
  Administered 2021-05-17: 5 mg via ORAL
  Filled 2021-05-17: qty 1

## 2021-05-17 MED ORDER — POTASSIUM CHLORIDE CRYS ER 10 MEQ PO TBCR
10.0000 meq | EXTENDED_RELEASE_TABLET | ORAL | Status: DC
Start: 2021-05-18 — End: 2021-05-18
  Administered 2021-05-18: 10 meq via ORAL
  Filled 2021-05-17: qty 1

## 2021-05-17 MED ORDER — BISACODYL 10 MG RE SUPP: 10.0000 mg | Freq: Every day | RECTAL | Status: DC | PRN

## 2021-05-17 MED ORDER — METOCLOPRAMIDE HCL 5 MG/ML IJ SOLN: 5.0000 mg | Freq: Three times a day (TID) | INTRAMUSCULAR | Status: DC | PRN

## 2021-05-17 MED ORDER — DORZOLAMIDE HCL-TIMOLOL MAL 2-0.5 % OP SOLN
1.0000 [drp] | Freq: Every day | OPHTHALMIC | Status: DC
  Administered 2021-05-18: 1 [drp] via OPHTHALMIC
  Filled 2021-05-17: qty 10

## 2021-05-17 MED ORDER — ACETAMINOPHEN 500 MG PO TABS
1000.0000 mg | ORAL_TABLET | Freq: Once | ORAL | Status: AC
  Administered 2021-05-17: 1000 mg via ORAL

## 2021-05-17 MED ORDER — METHOCARBAMOL 500 MG PO TABS: 500.0000 mg | ORAL_TABLET | Freq: Four times a day (QID) | ORAL | Status: DC | PRN

## 2021-05-17 MED ORDER — DORZOLAMIDE HCL-TIMOLOL MAL PF 22.3-6.8 MG/ML OP SOLN: 1.0000 [drp] | OPHTHALMIC | Status: DC

## 2021-05-17 MED ORDER — LIDOCAINE HCL (PF) 2 % IJ SOLN
INTRAMUSCULAR | Status: AC
  Filled 2021-05-17: qty 5

## 2021-05-17 MED ORDER — METOCLOPRAMIDE HCL 5 MG PO TABS: 5.0000 mg | ORAL_TABLET | Freq: Three times a day (TID) | ORAL | Status: DC | PRN

## 2021-05-17 MED ORDER — DEXAMETHASONE SODIUM PHOSPHATE 10 MG/ML IJ SOLN
10.0000 mg | Freq: Once | INTRAMUSCULAR | Status: AC
  Administered 2021-05-18: 10 mg via INTRAVENOUS
  Filled 2021-05-17: qty 1

## 2021-05-17 MED ORDER — HYDROCODONE-ACETAMINOPHEN 5-325 MG PO TABS
ORAL_TABLET | ORAL | Status: AC
  Filled 2021-05-17: qty 1

## 2021-05-17 MED ORDER — ACETAMINOPHEN 500 MG PO TABS
ORAL_TABLET | ORAL | Status: AC
  Filled 2021-05-17: qty 2

## 2021-05-17 MED ORDER — FENTANYL CITRATE PF 50 MCG/ML IJ SOSY
25.0000 ug | PREFILLED_SYRINGE | INTRAMUSCULAR | Status: DC | PRN
  Administered 2021-05-17 (×2): 25 ug via INTRAVENOUS
  Administered 2021-05-17 (×2): 50 ug via INTRAVENOUS

## 2021-05-17 MED ORDER — DEXAMETHASONE SODIUM PHOSPHATE 10 MG/ML IJ SOLN
INTRAMUSCULAR | Status: AC
  Filled 2021-05-17: qty 1

## 2021-05-17 MED ORDER — 0.9 % SODIUM CHLORIDE (POUR BTL) OPTIME
TOPICAL | Status: DC | PRN
  Administered 2021-05-17: 1000 mL

## 2021-05-17 MED ORDER — METHOCARBAMOL 500 MG IVPB - SIMPLE MED
INTRAVENOUS | Status: AC
  Filled 2021-05-17: qty 50

## 2021-05-17 MED ORDER — CICLOPIROX 8 % EX SOLN
1.0000 | Freq: Every day | CUTANEOUS | Status: DC
Start: 2021-05-18 — End: 2021-05-18

## 2021-05-17 MED ORDER — SODIUM CHLORIDE 0.9 % IV SOLN: INTRAVENOUS | Status: DC

## 2021-05-17 MED ORDER — FENTANYL CITRATE PF 50 MCG/ML IJ SOSY
PREFILLED_SYRINGE | INTRAMUSCULAR | Status: AC
  Filled 2021-05-17: qty 1

## 2021-05-17 MED ORDER — POLYETHYLENE GLYCOL 3350 17 G PO PACK: 17.0000 g | PACK | Freq: Every day | ORAL | Status: DC | PRN

## 2021-05-17 MED ORDER — HYDROCODONE-ACETAMINOPHEN 7.5-325 MG PO TABS
1.0000 | ORAL_TABLET | ORAL | Status: DC | PRN
  Administered 2021-05-17 (×2): 1 via ORAL
  Filled 2021-05-17 (×2): qty 1

## 2021-05-17 MED ORDER — SODIUM CHLORIDE 0.9 % IR SOLN
Status: DC | PRN
  Administered 2021-05-17: 3000 mL

## 2021-05-17 MED ORDER — ORAL CARE MOUTH RINSE: 15.0000 mL | Freq: Once | OROMUCOSAL | Status: AC

## 2021-05-17 MED ORDER — DEXAMETHASONE SODIUM PHOSPHATE 10 MG/ML IJ SOLN
8.0000 mg | Freq: Once | INTRAMUSCULAR | Status: AC
  Administered 2021-05-17: 8 mg via INTRAVENOUS

## 2021-05-17 MED ORDER — PHENOL 1.4 % MT LIQD: 1.0000 | OROMUCOSAL | Status: DC | PRN

## 2021-05-17 MED ORDER — FERROUS SULFATE 325 (65 FE) MG PO TABS
325.0000 mg | ORAL_TABLET | Freq: Three times a day (TID) | ORAL | Status: DC
  Administered 2021-05-17 – 2021-05-18 (×2): 325 mg via ORAL
  Filled 2021-05-17 (×2): qty 1

## 2021-05-17 MED ORDER — POTASSIUM CHLORIDE CRYS ER 10 MEQ PO TBCR: 10.0000 meq | EXTENDED_RELEASE_TABLET | ORAL | Status: DC

## 2021-05-17 MED ORDER — MORPHINE SULFATE (PF) 2 MG/ML IV SOLN
0.5000 mg | INTRAVENOUS | Status: DC | PRN
  Administered 2021-05-17: 1 mg via INTRAVENOUS
  Filled 2021-05-17: qty 1

## 2021-05-17 MED ORDER — MENTHOL 3 MG MT LOZG: 1.0000 | LOZENGE | OROMUCOSAL | Status: DC | PRN

## 2021-05-17 MED ORDER — DOCUSATE SODIUM 100 MG PO CAPS
100.0000 mg | ORAL_CAPSULE | Freq: Two times a day (BID) | ORAL | Status: DC
  Administered 2021-05-17 – 2021-05-18 (×2): 100 mg via ORAL
  Filled 2021-05-17 (×2): qty 1

## 2021-05-17 MED ORDER — POVIDONE-IODINE 10 % EX SWAB
2.0000 "application " | Freq: Once | CUTANEOUS | Status: AC
  Administered 2021-05-17: 2 via TOPICAL

## 2021-05-17 MED ORDER — AMIODARONE HCL 200 MG PO TABS
200.0000 mg | ORAL_TABLET | Freq: Two times a day (BID) | ORAL | Status: DC
  Administered 2021-05-17 – 2021-05-18 (×2): 200 mg via ORAL
  Filled 2021-05-17 (×2): qty 1

## 2021-05-17 MED ORDER — CEFAZOLIN SODIUM-DEXTROSE 2-4 GM/100ML-% IV SOLN
2.0000 g | INTRAVENOUS | Status: AC
  Administered 2021-05-17: 2 g via INTRAVENOUS
  Filled 2021-05-17: qty 100

## 2021-05-17 SURGICAL SUPPLY — 42 items
BAG COUNTER SPONGE SURGICOUNT (BAG) IMPLANT
BAG ZIPLOCK 12X15 (MISCELLANEOUS) ×2 IMPLANT
BLADE EXTENDED COATED 6.5IN (ELECTRODE) ×2 IMPLANT
BLADE SAW SGTL 11.0X1.19X90.0M (BLADE) IMPLANT
BNDG COHESIVE 4X5 TAN ST LF (GAUZE/BANDAGES/DRESSINGS) ×2 IMPLANT
COVER SURGICAL LIGHT HANDLE (MISCELLANEOUS) ×2 IMPLANT
DERMABOND ADVANCED (GAUZE/BANDAGES/DRESSINGS) ×1
DERMABOND ADVANCED .7 DNX12 (GAUZE/BANDAGES/DRESSINGS) ×1 IMPLANT
DRAPE ORTHO SPLIT 77X108 STRL (DRAPES) ×4
DRAPE SURG 17X11 SM STRL (DRAPES) ×2 IMPLANT
DRAPE SURG ORHT 6 SPLT 77X108 (DRAPES) ×2 IMPLANT
DRAPE U-SHAPE 47X51 STRL (DRAPES) ×2 IMPLANT
DRSG AQUACEL AG ADV 3.5X10 (GAUZE/BANDAGES/DRESSINGS) IMPLANT
DRSG AQUACEL AG ADV 3.5X14 (GAUZE/BANDAGES/DRESSINGS) ×2 IMPLANT
DURAPREP 26ML APPLICATOR (WOUND CARE) ×2 IMPLANT
ELECT REM PT RETURN 15FT ADLT (MISCELLANEOUS) ×2 IMPLANT
EVACUATOR 1/8 PVC DRAIN (DRAIN) ×2 IMPLANT
GAUZE SPONGE 2X2 8PLY STRL LF (GAUZE/BANDAGES/DRESSINGS) ×1 IMPLANT
GLOVE SURG ENC TEXT LTX SZ7 (GLOVE) IMPLANT
GLOVE SURG ORTHO LTX SZ8 (GLOVE) ×2 IMPLANT
GLOVE SURG UNDER POLY LF SZ7.5 (GLOVE) ×2 IMPLANT
GOWN STRL REUS W/TWL LRG LVL3 (GOWN DISPOSABLE) ×2 IMPLANT
HANDPIECE INTERPULSE COAX TIP (DISPOSABLE) ×2
IRRIGATION SURGIPHOR STRL (IV SOLUTION) IMPLANT
IV NS IRRIG 3000ML ARTHROMATIC (IV SOLUTION) ×2 IMPLANT
KIT BASIN OR (CUSTOM PROCEDURE TRAY) ×2 IMPLANT
KIT TURNOVER KIT A (KITS) IMPLANT
MANIFOLD NEPTUNE II (INSTRUMENTS) ×2 IMPLANT
PACK TOTAL JOINT (CUSTOM PROCEDURE TRAY) ×2 IMPLANT
PROTECTOR NERVE ULNAR (MISCELLANEOUS) ×2 IMPLANT
SET HNDPC FAN SPRY TIP SCT (DISPOSABLE) ×1 IMPLANT
SPONGE GAUZE 2X2 STER 10/PKG (GAUZE/BANDAGES/DRESSINGS) ×1
STAPLER VISISTAT 35W (STAPLE) IMPLANT
SUT MNCRL AB 3-0 PS2 18 (SUTURE) ×2 IMPLANT
SUT MNCRL AB 4-0 PS2 18 (SUTURE) ×2 IMPLANT
SUT STRATAFIX PDS+ 0 24IN (SUTURE) ×2 IMPLANT
SUT VIC AB 1 CT1 36 (SUTURE) ×4 IMPLANT
SUT VIC AB 2-0 CT1 27 (SUTURE) ×4
SUT VIC AB 2-0 CT1 TAPERPNT 27 (SUTURE) ×2 IMPLANT
SWAB COLLECTION DEVICE MRSA (MISCELLANEOUS) IMPLANT
SWAB CULTURE ESWAB REG 1ML (MISCELLANEOUS) IMPLANT
TOWEL OR 17X26 10 PK STRL BLUE (TOWEL DISPOSABLE) ×2 IMPLANT

## 2021-05-17 NOTE — H&P (Signed)
HIP I&D ADMISSION H&P  Patient is admitted for irrigation and debridement of right hip, evacuation of pseudotumor   Subjective:  Chief Complaint: Right hip fluid collection   HPI: [name], 86 y.o. male, has a history of pain and functional disability in the right hip(s) due to  psuedotumor with fluid colletion  and patient has failed non-surgical conservative treatments for greater than 12 weeks to include  IR aspiration .  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 He underwent IR aspiration without much result. Dr. Alvan Dame discussed proceeding with repeat irrigation and debridement, which he agrees with.   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 AKBARY AS PRECAUTION  ; has been on eliquis since but reports he has not taken it for 5 weeks b/c he was told to hold it for the multiple irrigations and debridements for the hip infection. reports his cardiologist is aware he has been holding it;    Arthritis    Atherosclerosis of both carotid arteries    BPH (benign prostatic hyperplasia)    Dysrhythmia    a-fib   Fall    fell on friday 01-16-18;   lost balAnce whIile tryng to get his sock on ;sustained skin tear to left wrist ; no drainage  , scabbed over , and has been covering with  bandage    GERD (gastroesophageal reflux disease)    Heart murmur    SINCE HIS 20s   HLD (hyperlipidemia)    Hypertension    Non-rheumatic mitral regurgitation    PVC (premature ventricular contraction)     Past Surgical History:  Procedure Laterality Date   BACK SURGERY  2007   L1-L5 LUMBAR WITH HARDWARE PLACED   CARDIOVERSION     CATARACT EXTRACTION, BILATERAL  2015   CRYOABLATION     CARDIAC ABLATION    DACROCYSTORHINOSTOMY  07/26/2011   EYE SURGERY     HERNIA REPAIR Bilateral    hx of echocardiogram      INCISION AND DRAINAGE HIP Right 01/26/2018   Procedure: Right hip irrigation and debridement, excisional and non excisional debridement, head liner exchange, posterior approach;  Surgeon: Paralee Cancel, MD;  Location: [facility];  Service: Orthopedics;  Laterality: Right;  90 mins Would like to start earlier around 2:00pm if time opens   [place] Right 11/26/2018   Procedure: IRRIGATION AND DEBRIDEMENT HIP;  Surgeon: Paralee Cancel, MD;  Location: [facility];  Service: Orthopedics;  Laterality: Right;  90 mins   INGUINAL HERNIA REPAIR Bilateral    with mesh    IR US GUIDE BX ASP/DRAIN  12/17/2017   IRRIGATION AND ASPIRATION RIGHT HIP   12/10/2017   [place]  x2, [place] x1  KIDNEY STONE SURGERY     lithotripsy    KYPHOPLASTY     T7   LATERAL FUSION LUMBAR SPINE     L1-L5   PULMONARY VEIN ISOLATION AND LEFT ATRIAL ROOFLINE ABLATION   10/15/2017   DR Minna Merritts    ROTATOR CUFF REPAIR Left    SHOULDER DEBRIDEMENT Right    SHOULDER OPEN ROTATOR CUFF REPAIR Left    SHOULDER SURGERY  1988   arthroscopy     TONSILLECTOMY     TOTAL HIP ARTHROPLASTY     done twice   TOTAL HIP ARTHROPLASTY  2010   TOTAL HIP REVISION      05-2017, 10-2016    No current facility-administered medications for this encounter.   Current Outpatient Medications  Medication Sig Dispense Refill Last Dose   acetaminophen (TYLENOL) 500 MG tablet Take 500 mg by mouth daily as needed for moderate  pain or headache.      amiodarone (PACERONE) 200 MG tablet Take 200 mg by mouth 2 (two) times daily.      atorvastatin (LIPITOR) 40 MG tablet Take 40 mg by mouth daily.  3    Biotin (BIOTIN 5000) 5 MG CAPS Take 5 mg by mouth daily.      brimonidine (ALPHAGAN) 0.15 % ophthalmic solution Place 1 drop into both eyes at bedtime.      Cholecalciferol (VITAMIN D) 50 MCG (2000 UT) tablet Take 2,000 Units by mouth daily.      ciclopirox (PENLAC) 8 % solution Apply 1 application topically daily.      COSOPT PF 22.3-6.8 MG/ML SOLN ophthalmic solution Place 1 drop into both eyes every morning.  3    Eszopiclone 3 MG TABS Take 3 mg by mouth at bedtime.  1    Ferrous Sulfate (SLOW RELEASE IRON PO) Take 1 tablet by mouth daily.      ipratropium (ATROVENT) 0.06 % nasal spray Place 2 sprays into both nostrils daily as needed for allergies.      Lutein 20 MG TABS Take 20 mg by mouth daily.      LYCOPENE PO Take 20 mg by mouth daily.      Multiple Minerals (CALCIUM-MAGNESIUM-ZINC) TABS Take 1 tablet by mouth daily.      pantoprazole (PROTONIX) 40 MG tablet Take 40 mg by mouth daily as needed for heartburn.      potassium chloride (K-DUR) 10 MEQ tablet Take 10-20 mEq by mouth See admin instructions. Alternate taking 10 meq one day and 20 meq the next      rivaroxaban (XARELTO) 20 MG TABS tablet Take 20 mg by mouth daily with supper.      valACYclovir (VALTREX) 1000 MG tablet Take 1,000 mg by mouth daily as needed (fever blister).      zinc gluconate 50 MG tablet Take 25 mg by mouth 2 (two) times daily.      Allergies  Allergen Reactions   Other     Cat dander   Doxycycline Rash   Horse Epithelium Rash    Social History   Tobacco Use   Smoking status: Never   Smokeless tobacco: Never  Substance Use Topics   Alcohol use: Yes    Comment: socially     Family History  Problem Relation Age of Onset   Hypertension Mother    Cancer Mother      Review of Systems  Constitutional:  Negative for chills  and fever.  Respiratory:  Negative for cough and shortness of breath.   Cardiovascular:  Negative for chest pain.  Gastrointestinal:  Negative for nausea and vomiting.  Musculoskeletal:  Positive for arthralgias.    Objective:  Physical Exam Well nourished and well developed. General: Alert and oriented x3, cooperative and pleasant, no acute distress. Head: normocephalic, atraumatic, neck supple. Eyes: EOMI.  Musculoskeletal: Right hip exam: There is no signs of infection with regards to erythema or warmth He does have significant swelling in the right hip region including but more limited into the medial aspect of the thigh and to a lesser extent distally. He is neurovascular intact distally   Calves soft and nontender. Motor function intact in LE. Strength 5/5 LE bilaterally. Neuro: Distal pulses 2+. Sensation to light touch intact in LE.  Vital signs in last 24 hours: Temp:  [98.2 F (36.8 C)] 98.2 F (36.8 C) (11/30 0951) Pulse Rate:  [100] 100 (11/30 0951) Resp:  [16] 16 (11/30 0951) BP: (133)/(82) 133/82 (11/30 0951) SpO2:  [96 %] 96 % (11/30 0951) Weight:  [65.3 kg] 65.3 kg (11/30 0951)  Labs:   Estimated body mass index is 21.9 kg/m as calculated from the following:   Height as of 05/16/21: 5\' 8"  (1.727 m).   Weight as of 05/16/21: 65.3 kg.   Imaging Review Imaging: MRI performed in our office on 09/30/2020 was reviewed. This revealed extensive soft tissue swelling consistent with adverse tissue reaction as compared to prior studies.      Assessment/Plan:  Pseudotumor and fluid collection, right hip(s)  The patient history, physical examination, clinical judgement of the provider and imaging studies are consistent with psuedotumor of the right hip(s) and total hip arthroplasty is deemed medically necessary. The treatment options including medical management, injection therapy, arthroscopy and arthroplasty were discussed at length. The risks and benefits  of irrigation and debridement were presented and reviewed. The risks due to aseptic loosening, infection, stiffness, dislocation/subluxation,  thromboembolic complications and other imponderables were discussed.  The patient acknowledged the explanation, agreed to proceed with the plan and consent was signed. Patient is being admitted for inpatient treatment for surgery, pain control, PT, OT, prophylactic antibiotics, VTE prophylaxis, progressive ambulation and ADL's and discharge planning.The patient is planning to be discharged  home  Therapy Plans: HEP Disposition: Home with wife 10/02/2020) Planned DVT Prophylaxis: Xarelto 20 mg daily (a fib) DME needed: none PCP: Dr. Banker, will call today TXA: IV Allergies: NKDA Anesthesia Concerns: none BMI: 22.4 Last HgbA1c: Not diabetic  Other: - Norco, tylenol, methocarbamol - ? change head ball?   Sandy Salaam, PA-C Orthopedic Surgery [facility] [place] [phone]

## 2021-05-17 NOTE — Interval H&P Note (Signed)
History and Physical Interval Note:  05/17/2021 9:59 AM  Travis Wolfe  has presented today for surgery, with the diagnosis of Right hip metallosis, status post revision right total hip.  The various methods of treatment have been discussed with the patient and family. After consideration of risks, benefits and other options for treatment, the patient has consented to  Procedure(s): IRRIGATION AND DEBRIDEMENT RIGHT HIP, EVACUATION OF PSEUDOTUMOR (Right) as a surgical intervention.  The patient's history has been reviewed, patient examined, no change in status, stable for surgery.  I have reviewed the patient's chart and labs.  Questions were answered to the patient's satisfaction.     [name]

## 2021-05-17 NOTE — Anesthesia Preprocedure Evaluation (Addendum)
Anesthesia Evaluation  Patient identified by MRN, date of birth, ID band Patient awake    Reviewed: Allergy & Precautions, H&P , NPO status , Patient's Chart, lab work & pertinent test results  Airway Mallampati: II  TM Distance: >3 FB Neck ROM: Full    Dental no notable dental hx. (+) Teeth Intact, Dental Advisory Given   Pulmonary neg pulmonary ROS,    Pulmonary exam normal breath sounds clear to auscultation       Cardiovascular hypertension, + dysrhythmias Atrial Fibrillation  Rhythm:Irregular Rate:Normal     Neuro/Psych negative neurological ROS  negative psych ROS   GI/Hepatic Neg liver ROS, GERD  Medicated,  Endo/Other  negative endocrine ROS  Renal/GU negative Renal ROS  negative genitourinary   Musculoskeletal  (+) Arthritis , Osteoarthritis,    Abdominal   Peds  Hematology negative hematology ROS (+)   Anesthesia Other Findings   Reproductive/Obstetrics negative OB ROS                            Anesthesia Physical Anesthesia Plan  ASA: 3  Anesthesia Plan: General   Post-op Pain Management: Tylenol PO (pre-op)   Induction: Intravenous  PONV Risk Score and Plan: 3 and Ondansetron, Dexamethasone and Treatment may vary due to age or medical condition  Airway Management Planned: Oral ETT  Additional Equipment:   Intra-op Plan:   Post-operative Plan: Extubation in OR  Informed Consent: I have reviewed the patients History and Physical, chart, labs and discussed the procedure including the risks, benefits and alternatives for the proposed anesthesia with the patient or authorized representative who has indicated his/her understanding and acceptance.     Dental advisory given  Plan Discussed with: CRNA  Anesthesia Plan Comments:        Anesthesia Quick Evaluation

## 2021-05-17 NOTE — Anesthesia Procedure Notes (Signed)
Procedure Name: Intubation Date/Time: 05/17/2021 11:02 AM Performed by: Marny Lowenstein, CRNA Pre-anesthesia Checklist: Patient identified, Emergency Drugs available, Suction available and Patient being monitored Patient Re-evaluated:Patient Re-evaluated prior to induction Oxygen Delivery Method: Circle system utilized Preoxygenation: Pre-oxygenation with 100% oxygen Induction Type: IV induction Ventilation: Mask ventilation without difficulty Laryngoscope Size: Miller and 2 Grade View: Grade I Tube type: Oral Tube size: 7.5 mm Number of attempts: 1 Airway Equipment and Method: Stylet Placement Confirmation: ETT inserted through vocal cords under direct vision, positive ETCO2 and breath sounds checked- equal and bilateral Secured at: 23 cm Tube secured with: Tape Dental Injury: Teeth and Oropharynx as per pre-operative assessment

## 2021-05-17 NOTE — Plan of Care (Signed)
Plan of care reviewed and discussed with the patient. 

## 2021-05-17 NOTE — Plan of Care (Signed)
  Problem: Activity: Goal: Risk for activity intolerance will decrease Outcome: Progressing   Problem: Nutrition: Goal: Adequate nutrition will be maintained Outcome: Progressing   Problem: Pain Managment: Goal: General experience of comfort will improve Outcome: Progressing   Problem: Safety: Goal: Ability to remain free from injury will improve Outcome: Progressing   

## 2021-05-17 NOTE — Discharge Instructions (Signed)
INSTRUCTIONS AFTER SURGERY  Remove items at home which could result in a fall. This includes throw rugs or furniture in walking pathways ICE to the affected joint every three hours while awake for 30 minutes at a time, for at least the first 3-5 days, and then as needed for pain and swelling.  Continue to use ice for pain and swelling. You may notice swelling that will progress down to the foot and ankle.  This is normal after surgery.  Elevate your leg when you are not up walking on it.   Continue to use the breathing machine you got in the hospital (incentive spirometer) which will help keep your temperature down.  It is common for your temperature to cycle up and down following surgery, especially at night when you are not up moving around and exerting yourself.  The breathing machine keeps your lungs expanded and your temperature down.   DIET:  As you were doing prior to hospitalization, we recommend a well-balanced diet.  DRESSING / WOUND CARE / SHOWERING  Keep the surgical dressing until follow up.  The dressing is water proof, so you can shower without any extra covering.  IF THE DRESSING FALLS OFF or the wound gets wet inside, change the dressing with sterile gauze.  Please use good hand washing techniques before changing the dressing.  Do not use any lotions or creams on the incision until instructed by your surgeon.    ACTIVITY  Increase activity slowly as tolerated, but follow the weight bearing instructions below.   No driving for 6 weeks or until further direction given by your physician.  You cannot drive while taking narcotics.  No lifting or carrying greater than 10 lbs. until further directed by your surgeon. Avoid periods of inactivity such as sitting longer than an hour when not asleep. This helps prevent blood clots.  You may return to work once you are authorized by your doctor.     WEIGHT BEARING   Weight bearing as tolerated with assist device (walker, cane, etc) as  directed, use it as long as suggested by your surgeon or therapist, typically at least 4-6 weeks.   EXERCISES  Results after joint replacement surgery are often greatly improved when you follow the exercise, range of motion and muscle strengthening exercises prescribed by your doctor. Safety measures are also important to protect the joint from further injury. Any time any of these exercises cause you to have increased pain or swelling, decrease what you are doing until you are comfortable again and then slowly increase them. If you have problems or questions, call your caregiver or physical therapist for advice.   Rehabilitation is important following a joint replacement. After just a few days of immobilization, the muscles of the leg can become weakened and shrink (atrophy).  These exercises are designed to build up the tone and strength of the thigh and leg muscles and to improve motion. Often times heat used for twenty to thirty minutes before working out will loosen up your tissues and help with improving the range of motion but do not use heat for the first two weeks following surgery (sometimes heat can increase post-operative swelling).   These exercises can be done on a training (exercise) mat, on the floor, on a table or on a bed. Use whatever works the best and is most comfortable for you.    Use music or television while you are exercising so that the exercises are a pleasant break in your day. This   will make your life better with the exercises acting as a break in your routine that you can look forward to.   Perform all exercises about fifteen times, three times per day or as directed.  You should exercise both the operative leg and the other leg as well.  Exercises include:   Quad Sets - Tighten up the muscle on the front of the thigh (Quad) and hold for 5-10 seconds.   Straight Leg Raises - With your knee straight (if you were given a brace, keep it on), lift the leg to 60 degrees, hold  for 3 seconds, and slowly lower the leg.  Perform this exercise against resistance later as your leg gets stronger.  Leg Slides: Lying on your back, slowly slide your foot toward your buttocks, bending your knee up off the floor (only go as far as is comfortable). Then slowly slide your foot back down until your leg is flat on the floor again.  Angel Wings: Lying on your back spread your legs to the side as far apart as you can without causing discomfort.  Hamstring Strength:  Lying on your back, push your heel against the floor with your leg straight by tightening up the muscles of your buttocks.  Repeat, but this time bend your knee to a comfortable angle, and push your heel against the floor.  You may put a pillow under the heel to make it more comfortable if necessary.   A rehabilitation program following joint replacement surgery can speed recovery and prevent re-injury in the future due to weakened muscles. Contact your doctor or a physical therapist for more information on knee rehabilitation.    CONSTIPATION  Constipation is defined medically as fewer than three stools per week and severe constipation as less than one stool per week.  Even if you have a regular bowel pattern at home, your normal regimen is likely to be disrupted due to multiple reasons following surgery.  Combination of anesthesia, postoperative narcotics, change in appetite and fluid intake all can affect your bowels.   YOU MUST use at least one of the following options; they are listed in order of increasing strength to get the job done.  They are all available over the counter, and you may need to use some, POSSIBLY even all of these options:    Drink plenty of fluids (prune juice may be helpful) and high fiber foods Colace 100 mg by mouth twice a day  Senokot for constipation as directed and as needed Dulcolax (bisacodyl), take with full glass of water  Miralax (polyethylene glycol) once or twice a day as needed.  If  you have tried all these things and are unable to have a bowel movement in the first 3-4 days after surgery call either your surgeon or your primary doctor.    If you experience loose stools or diarrhea, hold the medications until you stool forms back up.  If your symptoms do not get better within 1 week or if they get worse, check with your doctor.  If you experience "the worst abdominal pain ever" or develop nausea or vomiting, please contact the office immediately for further recommendations for treatment.   ITCHING:  If you experience itching with your medications, try taking only a single pain pill, or even half a pain pill at a time.  You can also use Benadryl over the counter for itching or also to help with sleep.   TED HOSE STOCKINGS:  Use stockings on both legs until   for at least 2 weeks or as directed by physician office. They may be removed at night for sleeping.  MEDICATIONS:  See your medication summary on the "After Visit Summary" that nursing will review with you.  You may have some home medications which will be placed on hold until you complete the course of blood thinner medication.  It is important for you to complete the blood thinner medication as prescribed.  PRECAUTIONS:  If you experience chest pain or shortness of breath - call 911 immediately for transfer to the hospital emergency department.   If you develop a fever greater that 101 F, purulent drainage from wound, increased redness or drainage from wound, foul odor from the wound/dressing, or calf pain - CONTACT YOUR SURGEON.                                                   FOLLOW-UP APPOINTMENTS:  If you do not already have a post-op appointment, please call the office for an appointment to be seen by your surgeon.  Guidelines for how soon to be seen are listed in your "After Visit Summary", but are typically between 1-4 weeks after surgery.  OTHER INSTRUCTIONS:   Knee Replacement:  Do not place pillow under knee,  focus on keeping the knee straight while resting. CPM instructions: 0-90 degrees, 2 hours in the morning, 2 hours in the afternoon, and 2 hours in the evening. Place foam block, curve side up under heel at all times except when in CPM or when walking.  DO NOT modify, tear, cut, or change the foam block in any way.  POST-OPERATIVE OPIOID TAPER INSTRUCTIONS: It is important to wean off of your opioid medication as soon as possible. If you do not need pain medication after your surgery it is ok to stop day one. Opioids include: Codeine, Hydrocodone(Norco, Vicodin), Oxycodone(Percocet, oxycontin) and hydromorphone amongst others.  Long term and even short term use of opiods can cause: Increased pain response Dependence Constipation Depression Respiratory depression And more.  Withdrawal symptoms can include Flu like symptoms Nausea, vomiting And more Techniques to manage these symptoms Hydrate well Eat regular healthy meals Stay active Use relaxation techniques(deep breathing, meditating, yoga) Do Not substitute Alcohol to help with tapering If you have been on opioids for less than two weeks and do not have pain than it is ok to stop all together.  Plan to wean off of opioids This plan should start within one week post op of your joint replacement. Maintain the same interval or time between taking each dose and first decrease the dose.  Cut the total daily intake of opioids by one tablet each day Next start to increase the time between doses. The last dose that should be eliminated is the evening dose.   MAKE SURE YOU:  Understand these instructions.  Get help right away if you are not doing well or get worse.    Thank you for letting us be a part of your medical care team.  It is a privilege we respect greatly.  We hope these instructions will help you stay on track for a fast and full recovery!      

## 2021-05-17 NOTE — Transfer of Care (Signed)
Immediate Anesthesia Transfer of Care Note  Patient: Travis Wolfe  Procedure(s) Performed: IRRIGATION AND DEBRIDEMENT RIGHT HIP, EVACUATION OF PSEUDOTUMOR (Right: Hip)  Patient Location: PACU  Anesthesia Type:General  Level of Consciousness: drowsy  Airway & Oxygen Therapy: Patient Spontanous Breathing and Patient connected to face mask oxygen  Post-op Assessment: Report given to RN and Post -op Vital signs reviewed and stable  Post vital signs: Reviewed and stable  Last Vitals:  Vitals Value Taken Time  BP 130/86 05/17/21 1222  Temp    Pulse 53 05/17/21 1224  Resp 19 05/17/21 1225  SpO2 100 % 05/17/21 1224  Vitals shown include unvalidated device data.  Last Pain:  Vitals:   05/17/21 0950  TempSrc:   PainSc: 0-No pain         Complications: No notable events documented.

## 2021-05-18 DIAGNOSIS — T8451XA Infection and inflammatory reaction due to internal right hip prosthesis, initial encounter: Secondary | ICD-10-CM | POA: Diagnosis not present

## 2021-05-18 LAB — CBC
HCT: 31.7 % — ABNORMAL LOW (ref 39.0–52.0)
Hemoglobin: 10.3 g/dL — ABNORMAL LOW (ref 13.0–17.0)
MCH: 31.3 pg (ref 26.0–34.0)
MCHC: 32.5 g/dL (ref 30.0–36.0)
MCV: 96.4 fL (ref 80.0–100.0)
Platelets: 117 10*3/uL — ABNORMAL LOW (ref 150–400)
RBC: 3.29 MIL/uL — ABNORMAL LOW (ref 4.22–5.81)
RDW: 14.8 % (ref 11.5–15.5)
WBC: 8.2 10*3/uL (ref 4.0–10.5)
nRBC: 0 % (ref 0.0–0.2)

## 2021-05-18 LAB — BASIC METABOLIC PANEL
Anion gap: 5 (ref 5–15)
BUN: 29 mg/dL — ABNORMAL HIGH (ref 8–23)
CO2: 22 mmol/L (ref 22–32)
Calcium: 8 mg/dL — ABNORMAL LOW (ref 8.9–10.3)
Chloride: 110 mmol/L (ref 98–111)
Creatinine, Ser: 1.24 mg/dL (ref 0.61–1.24)
GFR, Estimated: 58 mL/min — ABNORMAL LOW (ref >60)
Glucose, Bld: 147 mg/dL — ABNORMAL HIGH (ref 70–99)
Potassium: 4.2 mmol/L (ref 3.5–5.1)
Sodium: 137 mmol/L (ref 135–145)

## 2021-05-18 MED ORDER — HYDROCODONE-ACETAMINOPHEN 5-325 MG PO TABS
1.0000 | ORAL_TABLET | Freq: Four times a day (QID) | ORAL | 0 refills | Status: DC | PRN
Start: 2021-05-18 — End: 2022-02-01

## 2021-05-18 MED ORDER — METHOCARBAMOL 500 MG PO TABS
500.0000 mg | ORAL_TABLET | Freq: Four times a day (QID) | ORAL | 0 refills | Status: DC | PRN
Start: 2021-05-18 — End: 2022-02-01

## 2021-05-18 MED ORDER — DOCUSATE SODIUM 100 MG PO CAPS: 100.0000 mg | ORAL_CAPSULE | Freq: Two times a day (BID) | ORAL | 0 refills | Status: DC

## 2021-05-18 MED ORDER — POLYETHYLENE GLYCOL 3350 17 G PO PACK: 17.0000 g | PACK | Freq: Every day | ORAL | 0 refills | Status: DC | PRN

## 2021-05-18 NOTE — Evaluation (Signed)
Physical Therapy One Time Evaluation Patient Details Name: Travis Wolfe MRN: [id number] DOB: 07-30-1934 Today's Date: 05/18/2021  History of Present Illness  Pt is an 86 year old male s/p IRRIGATION AND DEBRIDEMENT RIGHT HIP, EVACUATION OF PSEUDOTUMOR on 05/17/21. PMH includes R THA with history of infection and I&D, PAF, falls, HLD, HTN, lumbar fusion, kyphoplasty, L RTC repair, and  I &D of R THA, posterior incision utilized, on 11/26/18.  Clinical Impression  Patient evaluated by Physical Therapy with no further acute PT needs identified. All education has been completed and the patient has no further questions.  Pt with multiple hip surgeries and familiar with precautions.  Reviewed posterior hip precautions and also demonstrated maintaining during mobility.  Pt reports he has HEP from most recent surgery and verbally reviewed safe exercises.  Pt discouraged from performing SLR (states he was doing this prior to surgery) and aware to perform exercises within precautions.  Pt feels ready for d/c home today.  See below for any follow-up Physical Therapy or equipment needs. PT is signing off. Thank you for this referral.    Recommendations for follow up therapy are one component of a multi-disciplinary discharge planning process, led by the attending physician.  Recommendations may be updated based on patient status, additional functional criteria and insurance authorization.  Follow Up Recommendations Follow physician's recommendations for discharge plan and follow up therapies    Assistance Recommended at Discharge PRN  Functional Status Assessment Patient has had a recent decline in their functional status and demonstrates the ability to make significant improvements in function in a reasonable and predictable amount of time.  Equipment Recommendations  None recommended by PT    Recommendations for Other Services       Precautions / Restrictions Precautions Precautions: Fall;Posterior  Hip Precaution Comments: pt able to recall 2/3 hip precautions so reviewed verbally and demontrated posterior hip precautions Restrictions Weight Bearing Restrictions: No      Mobility  Bed Mobility               General bed mobility comments: pt sitting EOB with NT on arrival    Transfers Overall transfer level: Needs assistance Equipment used: Rolling walker (2 wheels) Transfers: Sit to/from Stand Sit to Stand: Supervision           General transfer comment: pt recalls technique to maintain hip precautions    Ambulation/Gait Ambulation/Gait assistance: Supervision Gait Distance (Feet): 400 Feet Assistive device: Rolling walker (2 wheels) Gait Pattern/deviations: Step-through pattern;Decreased stride length       General Gait Details: utilizes RW well and maintains precautions  Stairs Stairs: Yes Stairs assistance: Min guard;Supervision Stair Management: Step to pattern;Forwards;Two rails Number of Stairs: 3 General stair comments: able to recall safe sequencing  Wheelchair Mobility    Modified Rankin (Stroke Patients Only)       Balance                                             Pertinent Vitals/Pain Pain Assessment: 0-10 Pain Score: 2  Pain Location: right hip Pain Intervention(s): Monitored during session;Repositioned;Premedicated before session    Home Living Family/patient expects to be discharged to:: Private residence Living Arrangements: Spouse/significant other Available Help at Discharge: Family Type of Home: House Home Access: Stairs to enter Entrance Stairs-Rails: Doctor, general practice of Steps: 3   Home Layout: Able to live on  main level with bedroom/bathroom Home Equipment: Rolling Walker (2 wheels);Cane - single point      Prior Function Prior Level of Function : Independent/Modified Independent                     Hand Dominance        Extremity/Trunk Assessment         Lower Extremity Assessment Lower Extremity Assessment: RLE deficits/detail RLE Deficits / Details: grossly at least 2+ /5 hip strength maintaining posterior hip precautions, pt able to perform ankle pumps       Communication   Communication: No difficulties  Cognition Arousal/Alertness: Awake/alert Behavior During Therapy: WFL for tasks assessed/performed Overall Cognitive Status: Within Functional Limits for tasks assessed                                          General Comments      Exercises Total Joint Exercises Ankle Circles/Pumps: AROM;Both;10 reps Quad Sets: AROM;Both;10 reps   Assessment/Plan    PT Assessment Patient does not need any further PT services  PT Problem List         PT Treatment Interventions      PT Goals (Current goals can be found in the Care Plan section)  Acute Rehab PT Goals PT Goal Formulation: All assessment and education complete, DC therapy    Frequency     Barriers to discharge        Co-evaluation               AM-PAC PT "6 Clicks" Mobility  Outcome Measure Help needed turning from your back to your side while in a flat bed without using bedrails?: A Little Help needed moving from lying on your back to sitting on the side of a flat bed without using bedrails?: A Little Help needed moving to and from a bed to a chair (including a wheelchair)?: A Little Help needed standing up from a chair using your arms (e.g., wheelchair or bedside chair)?: A Little Help needed to walk in hospital room?: A Little Help needed climbing 3-5 steps with a railing? : A Little 6 Click Score: 18    End of Session   Activity Tolerance: Patient tolerated treatment well Patient left: in chair;with call bell/phone within reach Nurse Communication: Mobility status PT Visit Diagnosis: Difficulty in walking, not elsewhere classified (R26.2)    Time: 2694-8546 PT Time Calculation (min) (ACUTE ONLY): 14 min   Charges:   PT  Evaluation $PT Eval Low Complexity: 1 Low        Kati PT, DPT Acute Rehabilitation Services Pager: [phone] Office: [phone]  Kati L Payson 05/18/2021, 11:05 AM

## 2021-05-18 NOTE — TOC Transition Note (Signed)
Transition of Care [facility]) - CM/SW Discharge Note  Patient Details  Name: Travis Wolfe MRN: [id number] Date of Birth: 1934-09-02  Transition of Care [facility]) CM/SW Contact:  Sherie Don, LCSW Phone Number: 05/18/2021, 10:08 AM  Clinical Narrative: Patient is expected to discharge home after working with PT. CSW met with patient to confirm discharge plan. Patient will discharge home with a home exercise program (HEP). Patient has a rolling walker and elevated toilets at home, so there are no DME needs at this time. TOC signing off.  Final next level of care: Home/Self Care Barriers to Discharge: No Barriers Identified  Patient Goals and CMS Choice Patient states their goals for this hospitalization and ongoing recovery are:: Discharge home with HEP Choice offered to / list presented to : NA  Discharge Plan and Services         DME Arranged: N/A DME Agency: NA  Readmission Risk Interventions No flowsheet data found.

## 2021-05-18 NOTE — Anesthesia Postprocedure Evaluation (Signed)
Anesthesia Post Note  Patient: GEOVANI TOOTLE  Procedure(s) Performed: IRRIGATION AND DEBRIDEMENT RIGHT HIP, EVACUATION OF PSEUDOTUMOR (Right: Hip)     Patient location during evaluation: PACU Anesthesia Type: General Level of consciousness: awake and alert Pain management: pain level controlled Vital Signs Assessment: post-procedure vital signs reviewed and stable Respiratory status: spontaneous breathing, nonlabored ventilation and respiratory function stable Cardiovascular status: blood pressure returned to baseline and stable Postop Assessment: no apparent nausea or vomiting Anesthetic complications: no   No notable events documented.  Last Vitals:  Vitals:   05/18/21 0200 05/18/21 0506  BP:  104/68  Pulse:  70  Resp: 15 16  Temp:  36.5 C  SpO2:      Last Pain:  Vitals:   05/18/21 0506  TempSrc: Oral  PainSc:                  Rogue Rafalski,W. EDMOND

## 2021-05-18 NOTE — Progress Notes (Signed)
Subjective: 1 Day Post-Op Procedure(s) (LRB): IRRIGATION AND DEBRIDEMENT RIGHT HIP, EVACUATION OF PSEUDOTUMOR (Right) Patient reports pain as mild.   Patient seen in rounds with Dr. Charlann Boxer. Patient is resting in bed on exam this morning. He reports he is feeling well this morning. Foley catheter removed.  We will start therapy today.   Objective: Vital signs in last 24 hours: Temp:  [97.5 F (36.4 C)-98.4 F (36.9 C)] 97.7 F (36.5 C) (12/02 0506) Pulse Rate:  [30-83] 70 (12/02 0506) Resp:  [12-23] 16 (12/02 0506) BP: (96-135)/(58-86) 104/68 (12/02 0506) SpO2:  [93 %-100 %] 100 % (12/01 1804) Weight:  [65.3 kg] 65.3 kg (12/01 0902)  Intake/Output from previous day:  Intake/Output Summary (Last 24 hours) at 05/18/2021 0734 Last data filed at 05/18/2021 0600 Gross per 24 hour  Intake 2302.18 ml  Output 685 ml  Net 1617.18 ml     Intake/Output this shift: No intake/output data recorded.  Labs: Recent Labs    05/16/21 0947 05/18/21 0346  HGB 13.1 10.3*   Recent Labs    05/16/21 0947 05/18/21 0346  WBC 7.1 8.2  RBC 4.16* 3.29*  HCT 40.8 31.7*  PLT 143* 117*   Recent Labs    05/16/21 0947 05/18/21 0346  NA 139 137  K 4.6 4.2  CL 109 110  CO2 27 22  BUN 21 29*  CREATININE 0.99 1.24  GLUCOSE 99 147*  CALCIUM 9.3 8.0*   No results for input(s): LABPT, INR in the last 72 hours.  Exam: General - Patient is Alert and Oriented Extremity - Neurologically intact Sensation intact distally Intact pulses distally Dorsiflexion/Plantar flexion intact Dressing - dressing C/D/I Hemovac drain in place, removed today. Motor Function - intact, moving foot and toes well on exam.   Past Medical History:  Diagnosis Date   A-fib (HCC)    REPORTS WAS DUE TO DEHYDRATION 3 YEARS AGO BUT WAS GIVEN FLUIDS IN ED AND MEDS TO SLOW HR AND DROVE HIMSELF HOME  ; HAD ABLATION X2 WITH CARDIO DR. AKBARY AS PRECAUTION  ; has been on eliquis since but reports he has not taken it for 5  weeks b/c he was told to hold it for the multiple irrigations and debridements for the hip infection. reports his cardiologist is aware he has been holding it;    Arthritis    Atherosclerosis of both carotid arteries    BPH (benign prostatic hyperplasia)    Dysrhythmia    a-fib   Fall    fell on friday 01-16-18;   lost balAnce whIile tryng to get his sock on ;sustained skin tear to left wrist ; no drainage  , scabbed over , and has been covering with bandage    GERD (gastroesophageal reflux disease)    Heart murmur    SINCE HIS 20s   HLD (hyperlipidemia)    Hypertension    Non-rheumatic mitral regurgitation    PVC (premature ventricular contraction)     Assessment/Plan: 1 Day Post-Op Procedure(s) (LRB): IRRIGATION AND DEBRIDEMENT RIGHT HIP, EVACUATION OF PSEUDOTUMOR (Right) Principal Problem:   History of revision of total replacement of right hip joint Active Problems:   S/P hip replacement   Metallosis  Estimated body mass index is 21.89 kg/m as calculated from the following:   Height as of this encounter: 5\' 8"  (1.727 m).   Weight as of this encounter: 65.3 kg. Advance diet Up with therapy D/C IV fluids  DVT Prophylaxis -  Eliquis Weight bearing as tolerated Hemovac pulled  without difficulty Begin therapy Hip precautions discussed with patient  Hgb stable at 10.3 this AM.  Plan is to go Home after hospital stay. Plan for discharge home today after 1-2 sessions of therapy. Follow up in the office in 2 weeks.   Griffith Citron, PA-C Orthopedic Surgery [phone] 05/18/2021, 7:34 AM

## 2021-05-19 ENCOUNTER — Encounter (HOSPITAL_COMMUNITY): Payer: Self-pay | Admitting: Orthopedic Surgery

## 2021-05-19 NOTE — Brief Op Note (Signed)
05/17/2021  1:33 PM  PATIENT:  Travis Wolfe  86 y.o. 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 Right hip hematomas  PROCEDURE:  Procedure(s): IRRIGATION AND DEBRIDEMENT RIGHT HIP, EVACUATION OF PSEUDOTUMOR (Right) and hematoma  SURGEON:  Surgeon(s) and Role:    Durene Romans, MD - Primary  PHYSICIAN ASSISTANT: Rosalene Billings, PA-C  ANESTHESIA:   general  EBL:  75 mL   BLOOD ADMINISTERED:none  DRAINS: (one) Hemovact drain(s) in the right hip with  Suction Open   LOCAL MEDICATIONS USED:  NONE  SPECIMEN:  No Specimen  DISPOSITION OF SPECIMEN:  N/A  COUNTS:  YES  TOURNIQUET:  * No tourniquets in log *  DICTATION: .Other Dictation: Dictation Number [id number]  PLAN OF CARE: Admit for overnight observation  PATIENT DISPOSITION:  PACU - hemodynamically stable.   Delay start of Pharmacological VTE agent (>24hrs) due to surgical blood loss or risk of bleeding: not applicable

## 2021-05-19 NOTE — Op Note (Signed)
NAME: Travis Wolfe, Travis Wolfe MEDICAL RECORD NO: [id number] ACCOUNT NO: [id number] DATE OF BIRTH: 1934-07-10 FACILITY: [facility]ATION: [id number] PHYSICIAN: Madlyn Frankel. Charlann Boxer, MD  Operative Report   DATE OF PROCEDURE: 05/17/2021  PREOPERATIVE DIAGNOSIS:  Complex history involving his right total hip arthroplasty, status post revision with history of metalosis with recurrent hip swelling.  POSTOPERATIVE DIAGNOSIS: 1.  Complex history involving his right total hip arthroplasty, status post revision with history of metalosis with recurrent hip swelling. 2.  Right hip hematoma.  OPERATION:  Excisional and non-excisional debridement of right hip with evacuation of persistent joint fluid and hematoma.  Please see dictated operative note in the section for details of the procedure as noted above.  SURGEON:  Madlyn Frankel. Charlann Boxer, MD  ASSISTANT:  Rosalene Billings, PA-C.  Note, Ms. Domenic Schwab was present for the entirety of the case for preoperative positioning, perioperative management of the operative extremity, general facilitation of the case and primary wound closure.  ANESTHESIA:  General.  ESTIMATED BLOOD LOSS:  Less than 100 mL.  DRAINS:  One medium Hemovac placed into the right hip.  FINDINGS:  See dictated operative note.  INDICATIONS:  The patient is a very pleasant 86 year old male with a history of right total hip arthroplasty with metal-on-metal components.  He was subsequently revised for the metalosis and had a significant debridement and washout of significant  metalosis involving the whole right hip and thigh.  He was revised to ceramic ball on polyethylene.  I had been following him over the years with recurrent swelling.  Most recently, he has had some right hip and thigh swelling, which we had sent him to  Interventional Radiology to drain.  He has had some persistent swelling including some larger soft tissue type masses or swelling laterally near his incision.  He felt that the swelling and  palpable masses laterally were affecting his quality of life and  function with regards to pain and discomfort.  He wished to have them addressed surgically.  We discussed the risk of infection and DVT.  We discussed the recurrence of these findings.  Consent was obtained for management of the right hip swelling.  DESCRIPTION OF PROCEDURE:  The patient was brought to the operative theater.  Once adequate anesthesia, preoperative antibiotics, Ancef administered.  He was positioned into the left lateral decubitus position with the right hip up.  The right lower  extremity was then prepped and draped in sterile fashion.  A time-out was performed identifying the patient, planned procedure, and extremity.  I used a portion of the incisions present laterally.  The incision was made on the skin.  I then dissected  through the subcutaneous level and encountered at this point some fluid in this region.  We evacuated what appeared to be more seroma as opposed to metalosis type fluid.  I removed probably 100 mL of fluid at this time.  Once this was done, I performed  an excisional debridement of nonviable tissue including tissue with subcutaneous layer.  The iliotibial band and gluteal fascia were opened formally.  Further debridement of 5-6 cm areas of hematoma on the areas anterior and posterior to the incision  were excised.  I found that this was what he was feeling and noted to be "bumps" on each side of his incision.  Once I performed an excisional debridement of the soft tissues as noted, which was carried out with a Bovie and a scalpel.  I performed a  non-excisional debridement with 3 liters of normal saline  solution.  I used the suction to evacuate as much fluid in all areas that were visible.  There was no evidence of any extension down into his thigh as we had noted in the past.  I did not  specifically dislocate his joint; however, I used the suction to remove as much fluid in the anterior aspect of the  hip as well as posteriorly.  Once I was satisfied with the debridement of this fluid as well as the soft tissue masses, which were  identified as old hematoma as well as the non-excisional debridement we reapproximated the iliotibial band over a median Hemovac drain that was taken out anteriorly.  The iliotibial band and gluteal fascia were reapproximated using #1 Vicryl and #1  Stratafix suture.  The remainder of the wound was closed with 2-0 Vicryl and a running Monocryl stitch.  The hip was clean, dry and dressed sterilely using surgical glue and Aquacel dressing with a drain site dressed separately.  He was then awoken from  anesthesia and brought to the recovery room in stable condition with plans to keep him overnight for antibiotics as well as for Hemovac drain output.  Findings reviewed with his wife.  I will review these findings with him postoperatively.  He will be  weightbearing as tolerated.   PUS D: 05/19/2021 1:42:25 pm T: 05/19/2021 3:51:00 pm  JOB: [id number]

## 2021-05-29 NOTE — Discharge Summary (Signed)
Physician Discharge Summary   Patient ID: Travis Wolfe MRN: [id number] DOB/AGE: [birth year] 84 y.o.  Admit date: 05/17/2021 Discharge date: 05/18/2021  Primary Diagnosis: 1.  Complex history involving his right total hip arthroplasty, status post revision with history of metalosis with recurrent hip swelling. 2.  Right hip hematoma.  Admission Diagnoses:  Past Medical History:  Diagnosis Date   A-fib ([place])    REPORTS WAS DUE TO DEHYDRATION 3 YEARS AGO BUT WAS GIVEN FLUIDS IN ED AND MEDS TO SLOW HR AND DROVE HIMSELF HOME  ; HAD ABLATION X2 WITH CARDIO DR. AKBARY AS PRECAUTION  ; has been on eliquis since but reports he has not taken it for 5 weeks b/c he was told to hold it for the multiple irrigations and debridements for the hip infection. reports his cardiologist is aware he has been holding it;    Arthritis    Atherosclerosis of both carotid arteries    BPH (benign prostatic hyperplasia)    Dysrhythmia    a-fib   Fall    fell on friday 01-16-18;   lost balAnce whIile tryng to get his sock on ;sustained skin tear to left wrist ; no drainage  , scabbed over , and has been covering with bandage    GERD (gastroesophageal reflux disease)    Heart murmur    SINCE HIS 20s   HLD (hyperlipidemia)    Hypertension    Non-rheumatic mitral regurgitation    PVC (premature ventricular contraction)    Discharge Diagnoses:   Principal Problem:   History of revision of total replacement of right hip joint Active Problems:   S/P hip replacement   Metallosis  Estimated body mass index is 21.89 kg/m as calculated from the following:   Height as of this encounter: 5\' 8"  (1.727 m).   Weight as of this encounter: 65.3 kg.  Procedure:  Procedure(s) (LRB): IRRIGATION AND DEBRIDEMENT RIGHT HIP, EVACUATION OF PSEUDOTUMOR (Right)   Consults: None  HPI: The patient is a very pleasant 86 year old male with a history of right total hip arthroplasty with metal-on-metal components.  He was  subsequently revised for the metalosis and had a significant debridement and washout of significant  metalosis involving the whole right hip and thigh.  He was revised to ceramic ball on polyethylene.  I had been following him over the years with recurrent swelling.  Most recently, he has had some right hip and thigh swelling, which we had sent him to  Interventional Radiology to drain.  He has had some persistent swelling including some larger soft tissue type masses or swelling laterally near his incision.  He felt that the swelling and palpable masses laterally were affecting his quality of life and  function with regards to pain and discomfort.  He wished to have them addressed surgically.  We discussed the risk of infection and DVT.  We discussed the recurrence of these findings.  Consent was obtained for management of the right hip swelling.  Laboratory Data: Admission on 05/17/2021, Discharged on 05/18/2021  Component Date Value Ref Range Status   SARS Coronavirus 2 05/17/2021 NEGATIVE  NEGATIVE Final   Comment: (NOTE) SARS-CoV-2 target nucleic acids are NOT DETECTED.  The SARS-CoV-2 RNA is generally detectable in upper and lower respiratory specimens during the acute phase of infection. The lowest concentration of SARS-CoV-2 viral copies this assay can detect is 250 copies / mL. A negative result does not preclude SARS-CoV-2 infection and should not be used as the sole basis for treatment  or other patient management decisions.  A negative result may occur with improper specimen collection / handling, submission of specimen other than nasopharyngeal swab, presence of viral mutation(s) within the areas targeted by this assay, and inadequate number of viral copies (<250 copies / mL). A negative result must be combined with clinical observations, patient history, and epidemiological information.  Fact Sheet for Patients:   BoilerBrush.com.cy  Fact Sheet for  Healthcare Providers: https://pope.com/  This test is not yet approved or                           cleared by the [place] FDA and has been authorized for detection and/or diagnosis of SARS-CoV-2 by FDA under an Emergency Use Authorization (EUA).  This EUA will remain in effect (meaning this test can be used) for the duration of the COVID-19 declaration under Section 564(b)(1) of the Act, 21 U.S.C. section 360bbb-3(b)(1), unless the authorization is terminated or revoked sooner.  Performed at [facility], [address], [place], [place] [address]    WBC 05/18/2021 8.2  4.0 - 10.5 K/uL Final   RBC 05/18/2021 3.29 (L)  4.22 - 5.81 MIL/uL Final   Hemoglobin 05/18/2021 10.3 (L)  13.0 - 17.0 g/dL Final   HCT 35/59/7416 31.7 (L)  39.0 - 52.0 % Final   MCV 05/18/2021 96.4  80.0 - 100.0 fL Final   MCH 05/18/2021 31.3  26.0 - 34.0 pg Final   MCHC 05/18/2021 32.5  30.0 - 36.0 g/dL Final   RDW 38/45/3646 14.8  11.5 - 15.5 % Final   Platelets 05/18/2021 117 (L)  150 - 400 K/uL Final   Comment: SPECIMEN CHECKED FOR CLOTS Immature Platelet Fraction may be clinically indicated, consider ordering this additional test [id number] REPEATED TO VERIFY PLATELET COUNT CONFIRMED BY SMEAR    nRBC 05/18/2021 0.0  0.0 - 0.2 % Final   Performed at [facility], [address] [place], [place] [address]   Sodium 05/18/2021 137  135 - 145 mmol/L Final   Potassium 05/18/2021 4.2  3.5 - 5.1 mmol/L Final   Chloride 05/18/2021 110  98 - 111 mmol/L Final   CO2 05/18/2021 22  22 - 32 mmol/L Final   Glucose, Bld 05/18/2021 147 (H)  70 - 99 mg/dL Final   Glucose reference range applies only to samples taken after fasting for at least 8 hours.   BUN 05/18/2021 29 (H)  8 - 23 mg/dL Final   Creatinine, Ser 05/18/2021 1.24  0.61 - 1.24 mg/dL Final   Calcium 37/09/8887 8.0 (L)  8.9 - 10.3 mg/dL Final   GFR, Estimated 05/18/2021 58 (L)  >60 mL/min  Final   Comment: (NOTE) Calculated using the CKD-EPI Creatinine Equation (2021)    Anion gap 05/18/2021 5  5 - 15 Final   Performed at [facility], [address] [place], [place] [address]  Hospital Outpatient Visit on 05/16/2021  Component Date Value Ref Range Status   WBC 05/16/2021 7.1  4.0 - 10.5 K/uL Final   RBC 05/16/2021 4.16 (L)  4.22 - 5.81 MIL/uL Final   Hemoglobin 05/16/2021 13.1  13.0 - 17.0 g/dL Final   HCT 03/88/8280 40.8  39.0 - 52.0 % Final   MCV 05/16/2021 98.1  80.0 - 100.0 fL Final   MCH 05/16/2021 31.5  26.0 - 34.0 pg Final   MCHC 05/16/2021 32.1  30.0 - 36.0 g/dL Final   RDW 03/49/1791 15.0  11.5 - 15.5 % Final   Platelets 05/16/2021 143 (L)  150 - 400 K/uL Final   nRBC 05/16/2021 0.0  0.0 - 0.2 % Final   Performed at [facility], [place] [address] [place], [place] [address]   Sodium 05/16/2021 139  135 - 145 mmol/L Final   Potassium 05/16/2021 4.6  3.5 - 5.1 mmol/L Final   Chloride 05/16/2021 109  98 - 111 mmol/L Final   CO2 05/16/2021 27  22 - 32 mmol/L Final   Glucose, Bld 05/16/2021 99  70 - 99 mg/dL Final   Glucose reference range applies only to samples taken after fasting for at least 8 hours.   BUN 05/16/2021 21  8 - 23 mg/dL Final   Creatinine, Ser 05/16/2021 0.99  0.61 - 1.24 mg/dL Final   Calcium 05/16/2021 9.3  8.9 - 10.3 mg/dL Final   Total Protein 05/16/2021 6.8  6.5 - 8.1 g/dL Final   Albumin 05/16/2021 4.2  3.5 - 5.0 g/dL Final   AST 05/16/2021 36  15 - 41 U/L Final   ALT 05/16/2021 28  0 - 44 U/L Final   Alkaline Phosphatase 05/16/2021 76  38 - 126 U/L Final   Total Bilirubin 05/16/2021 1.6 (H)  0.3 - 1.2 mg/dL Final   GFR, Estimated 05/16/2021 >60  >60 mL/min Final   Comment: (NOTE) Calculated using the CKD-EPI Creatinine Equation (2021)    Anion gap 05/16/2021 3 (L)  5 - 15 Final   Performed at [facility], [place] [address] [place], [place] [address]   ABO/RH(D) 05/16/2021 A POS    Final   Antibody Screen 05/16/2021 NEG   Final   Sample Expiration 05/16/2021 05/20/2021,2359   Final   Extend sample reason 05/16/2021    Final                   Value:NO TRANSFUSIONS OR PREGNANCY IN THE PAST 3 MONTHS Performed at [facility], [place] [address] [place], [place] [address]      X-Rays:No results found.  EKG: Orders placed or performed during the hospital encounter of 05/16/21   EKG 12 lead per protocol   EKG 12 lead per protocol     Hospital Course: Travis Wolfe is a 86 y.o. who was admitted to [facility]. They were brought to the operating room on 05/17/2021 and underwent Procedure(s): IRRIGATION AND DEBRIDEMENT RIGHT HIP, EVACUATION OF PSEUDOTUMOR.  Patient tolerated the procedure well and was later transferred to the recovery room and then to the orthopaedic floor for postoperative care. They were given PO and IV analgesics for pain control following their surgery. They were given 24 hours of postoperative antibiotics of  Anti-infectives (From admission, onward)    Start     Dose/Rate Route Frequency Ordered Stop   05/17/21 1700  ceFAZolin (ANCEF) IVPB 2g/100 mL premix        2 g 200 mL/hr over 30 Minutes Intravenous Every 6 hours 05/17/21 1553 05/18/21 0959   05/17/21 1614  valACYclovir (VALTREX) tablet 1,000 mg  Status:  Discontinued        1,000 mg Oral Daily PRN 05/17/21 1616 05/18/21 1707   05/17/21 1553  valACYclovir (VALTREX) tablet 1,000 mg  Status:  Discontinued        1,000 mg Oral Daily PRN 05/17/21 1553 05/17/21 1614   05/17/21 0915  ceFAZolin (ANCEF) IVPB 2g/100 mL premix        2 g 200 mL/hr over  30 Minutes Intravenous On call to O.R. 05/17/21 0901 05/17/21 1132      and started on DVT prophylaxis in the form of  Eliquis .   PT and OT were ordered for total joint protocol. Discharge planning consulted to help with postop disposition and equipment needs.  Patient had a good night on the evening of surgery. They started  to get up OOB with therapy on POD #1. Pt was seen during rounds and was ready to go home pending progress with therapy.He worked with therapy on POD #1 and was meeting his goals. Pt was discharged to home later that day in stable condition.  Diet: Regular diet Activity: WBAT Follow-up: in 2 weeks Disposition: Home Discharged Condition: good   Discharge Instructions     Call MD / Call 911   Complete by: As directed    If you experience chest pain or shortness of breath, CALL 911 and be transported to the hospital emergency room.  If you develope a fever above 101 F, pus (white drainage) or increased drainage or redness at the wound, or calf pain, call your surgeon's office.   Change dressing   Complete by: As directed    Maintain surgical dressing until follow up in the clinic. If the edges start to pull up, may reinforce with tape. If the dressing is no longer working, may remove and cover with gauze and tape, but must keep the area dry and clean.  Call with any questions or concerns.   Constipation Prevention   Complete by: As directed    Drink plenty of fluids.  Prune juice may be helpful.  You may use a stool softener, such as Colace (over the counter) 100 mg twice a day.  Use MiraLax (over the counter) for constipation as needed.   Diet - low sodium heart healthy   Complete by: As directed    Increase activity slowly as tolerated   Complete by: As directed    Weight bearing as tolerated with assist device (walker, cane, etc) as directed, use it as long as suggested by your surgeon or therapist, typically at least 4-6 weeks.   Post-operative opioid taper instructions:   Complete by: As directed    POST-OPERATIVE OPIOID TAPER INSTRUCTIONS: It is important to wean off of your opioid medication as soon as possible. If you do not need pain medication after your surgery it is ok to stop day one. Opioids include: Codeine, Hydrocodone(Norco, Vicodin), Oxycodone(Percocet, oxycontin) and  hydromorphone amongst others.  Long term and even short term use of opiods can cause: Increased pain response Dependence Constipation Depression Respiratory depression And more.  Withdrawal symptoms can include Flu like symptoms Nausea, vomiting And more Techniques to manage these symptoms Hydrate well Eat regular healthy meals Stay active Use relaxation techniques(deep breathing, meditating, yoga) Do Not substitute Alcohol to help with tapering If you have been on opioids for less than two weeks and do not have pain than it is ok to stop all together.  Plan to wean off of opioids This plan should start within one week post op of your joint replacement. Maintain the same interval or time between taking each dose and first decrease the dose.  Cut the total daily intake of opioids by one tablet each day Next start to increase the time between doses. The last dose that should be eliminated is the evening dose.      TED hose   Complete by: As directed    Use stockings (TED hose)  for 2 weeks on both leg(s).  You may remove them at night for sleeping.      Allergies as of 05/18/2021       Reactions   Other    Cat dander   Doxycycline Rash   Horse Epithelium Rash        Medication List     STOP taking these medications    acetaminophen 500 MG tablet Commonly known as: TYLENOL       TAKE these medications    amiodarone 200 MG tablet Commonly known as: PACERONE Take 200 mg by mouth 2 (two) times daily.   atorvastatin 40 MG tablet Commonly known as: LIPITOR Take 40 mg by mouth daily.   Biotin 5 MG Caps Take 5 mg by mouth daily.   brimonidine 0.15 % ophthalmic solution Commonly known as: ALPHAGAN Place 1 drop into both eyes at bedtime.   Calcium-Magnesium-Zinc Tabs Take 1 tablet by mouth daily.   ciclopirox 8 % solution Commonly known as: PENLAC Apply 1 application topically daily.   Cosopt PF 22.3-6.8 MG/ML Soln ophthalmic solution Generic drug:  dorzolamidel-timolol Place 1 drop into both eyes every morning.   docusate sodium 100 MG capsule Commonly known as: COLACE Take 1 capsule (100 mg total) by mouth 2 (two) times daily.   Eszopiclone 3 MG Tabs Take 3 mg by mouth at bedtime.   HYDROcodone-acetaminophen 5-325 MG tablet Commonly known as: NORCO/VICODIN Take 1-2 tablets by mouth every 6 (six) hours as needed for severe pain.   ipratropium 0.06 % nasal spray Commonly known as: ATROVENT Place 2 sprays into both nostrils daily as needed for allergies.   Lutein 20 MG Tabs Take 20 mg by mouth daily.   LYCOPENE PO Take 20 mg by mouth daily.   methocarbamol 500 MG tablet Commonly known as: ROBAXIN Take 1 tablet (500 mg total) by mouth every 6 (six) hours as needed for muscle spasms.   pantoprazole 40 MG tablet Commonly known as: PROTONIX Take 40 mg by mouth daily as needed for heartburn.   polyethylene glycol 17 g packet Commonly known as: MIRALAX / GLYCOLAX Take 17 g by mouth daily as needed for mild constipation.   potassium chloride 10 MEQ tablet Commonly known as: KLOR-CON M Take 10-20 mEq by mouth See admin instructions. Alternate taking 10 meq one day and 20 meq the next   rivaroxaban 20 MG Tabs tablet Commonly known as: XARELTO Take 20 mg by mouth daily with supper.   SLOW RELEASE IRON PO Take 1 tablet by mouth daily.   valACYclovir 1000 MG tablet Commonly known as: VALTREX Take 1,000 mg by mouth daily as needed (fever blister).   Vitamin D 50 MCG (2000 UT) tablet Take 2,000 Units by mouth daily.   zinc gluconate 50 MG tablet Take 25 mg by mouth 2 (two) times daily.               Discharge Care Instructions  (From admission, onward)           Start     Ordered   05/18/21 0000  Change dressing       Comments: Maintain surgical dressing until follow up in the clinic. If the edges start to pull up, may reinforce with tape. If the dressing is no longer working, may remove and cover with  gauze and tape, but must keep the area dry and clean.  Call with any questions or concerns.   05/18/21 [id number]  Follow-up Information     Paralee Cancel, MD. Schedule an appointment as soon as possible for a visit in 2 week(s).   Specialty: Orthopedic Surgery Contact information: [address] [place] [address] [phone]                 Signed: Griffith Citron, PA-C Orthopedic Surgery 05/29/2021, 2:49 PM

## 2021-06-17 HISTORY — PX: CRYOABLATION: SHX1415

## 2021-11-23 ENCOUNTER — Other Ambulatory Visit: Payer: Self-pay | Admitting: Orthopedic Surgery

## 2021-11-23 DIAGNOSIS — M533 Sacrococcygeal disorders, not elsewhere classified: Secondary | ICD-10-CM

## 2021-11-28 ENCOUNTER — Ambulatory Visit
Admission: RE | Admit: 2021-11-28 | Discharge: 2021-11-28 | Disposition: A | Discharge status: Home / Self Care | Payer: Medicare PPO | Source: Ambulatory Visit | Attending: Orthopedic Surgery | Admitting: Orthopedic Surgery

## 2021-11-28 ENCOUNTER — Other Ambulatory Visit: Payer: Self-pay | Admitting: Orthopedic Surgery

## 2021-11-28 DIAGNOSIS — M533 Sacrococcygeal disorders, not elsewhere classified: Secondary | ICD-10-CM

## 2022-01-22 NOTE — Patient Instructions (Addendum)
SURGICAL WAITING ROOM VISITATION Patients having surgery or a procedure may have no more than 2 support people in the waiting area - these visitors may rotate.   Children under the age of 31 must have an adult with them who is not the patient. If the patient needs to stay at the hospital during part of their recovery, the visitor guidelines for inpatient rooms apply. Pre-op nurse will coordinate an appropriate time for 1 support person to accompany patient in pre-op.  This support person may not rotate.    Please refer to the [facility] website for the visitor guidelines for Inpatients (after your surgery is over and you are in a regular room).      Your procedure is scheduled on: 01-31-22   Report to [facility] Main Entrance    Report to admitting at 9:00 AM   Call this number if you have problems the morning of surgery [phone]   Do not eat food :After Midnight.   After Midnight you may have the following liquids until 8:15 AM DAY OF SURGERY  Water Non-Citrus Juices (without pulp, NO RED) Carbonated Beverages Black Coffee (NO MILK/CREAM OR CREAMERS, sugar ok)  Clear Tea (NO MILK/CREAM OR CREAMERS, sugar ok) regular and decaf                             Plain Jell-O (NO RED)                                           Fruit ices (not with fruit pulp, NO RED)                                     Popsicles (NO RED)                                                               Sports drinks like Gatorade (NO RED)                   The day of surgery:  Drink ONE (1) Pre-Surgery Clear Ensure at 8:15 AM the morning of surgery. Drink in one sitting. Do not sip.  This drink was given to you during your hospital  pre-op appointment visit. Nothing else to drink after completing the Pre-Surgery Clear Ensure .          If you have questions, please contact your surgeon's office.   FOLLOW ANY ADDITIONAL PRE OP INSTRUCTIONS YOU RECEIVED FROM YOUR SURGEON'S OFFICE!!!      Oral Hygiene is also important to reduce your risk of infection.                                    Remember - BRUSH YOUR TEETH THE MORNING OF SURGERY WITH YOUR REGULAR TOOTHPASTE   Do NOT smoke after Midnight  Take these medicines the morning of surgery with A SIP OF WATER: Amiodarone, Atorvastatin, Pantoprazole.  Okay to use Cosopt eyedrops, nasal spray, Valcyclovir and  Hydrocodone if needed                             You may not have any metal on your body including  jewelry, and body piercing             Do not wear  lotions, powders, cologne, or deodorant              Men may shave face and neck.   Do not bring valuables to the hospital. [place].   Contacts, dentures or bridgework may not be worn into surgery.   Bring small overnight bag day of surgery.   DO NOT [place]. PHARMACY WILL DISPENSE MEDICATIONS LISTED ON YOUR MEDICATION LIST TO YOU DURING YOUR ADMISSION [place]!    Special Instructions: Bring a copy of your healthcare power of attorney and living will documents the day of surgery if you haven't scanned them before.  Please read over the following fact sheets you were given: IF YOU HAVE QUESTIONS ABOUT YOUR PRE-OP INSTRUCTIONS PLEASE CALL [place] - Preparing for Surgery Before surgery, you can play an important role.  Because skin is not sterile, your skin needs to be as free of germs as possible.  You can reduce the number of germs on your skin by washing with CHG (chlorahexidine gluconate) soap before surgery.  CHG is an antiseptic cleaner which kills germs and bonds with the skin to continue killing germs even after washing. Please DO NOT use if you have an allergy to CHG or antibacterial soaps.  If your skin becomes reddened/irritated stop using the CHG and inform your nurse when you arrive at Short Stay. Do not shave (including legs and underarms) for at least  48 hours prior to the first CHG shower.  You may shave your face/neck.  Please follow these instructions carefully:  1.  Shower with CHG Soap the night before surgery and the  morning of surgery.  2.  If you choose to wash your hair, wash your hair first as usual with your normal  shampoo.  3.  After you shampoo, rinse your hair and body thoroughly to remove the shampoo.                             4.  Use CHG as you would any other liquid soap.  You can apply chg directly to the skin and wash.  Gently with a scrungie or clean washcloth.  5.  Apply the CHG Soap to your body ONLY FROM THE NECK DOWN.   Do   not use on face/ open                           Wound or open sores. Avoid contact with eyes, ears mouth and   genitals (private parts).                       Wash face,  Genitals (private parts) with your normal soap.             6.  Wash thoroughly, paying special attention to the area where your    surgery  will be performed.  7.  Thoroughly rinse your body with warm water from the neck down.  8.  DO NOT shower/wash with your normal soap after using and rinsing off the CHG Soap.                9.  Pat yourself dry with a clean towel.            10.  Wear clean pajamas.            11.  Place clean sheets on your bed the night of your first shower and do not  sleep with pets. Day of Surgery : Do not apply any lotions/deodorants the morning of surgery.  Please wear clean clothes to the hospital/surgery center.  FAILURE TO FOLLOW THESE INSTRUCTIONS MAY RESULT IN THE CANCELLATION OF YOUR SURGERY  PATIENT SIGNATURE_________________________________  NURSE SIGNATURE__________________________________  ________________________________________________________________________     Travis Wolfe  An incentive spirometer is a tool that can help keep your lungs clear and active. This tool measures how well you are filling your lungs with each breath. Taking long deep breaths may help reverse  or decrease the chance of developing breathing (pulmonary) problems (especially infection) following: A long period of time when you are unable to move or be active. BEFORE THE PROCEDURE  If the spirometer includes an indicator to show your best effort, your nurse or respiratory therapist will set it to a desired goal. If possible, sit up straight or lean slightly forward. Try not to slouch. Hold the incentive spirometer in an upright position. INSTRUCTIONS FOR USE  Sit on the edge of your bed if possible, or sit up as far as you can in bed or on a chair. Hold the incentive spirometer in an upright position. Breathe out normally. Place the mouthpiece in your mouth and seal your lips tightly around it. Breathe in slowly and as deeply as possible, raising the piston or the ball toward the top of the column. Hold your breath for 3-5 seconds or for as long as possible. Allow the piston or ball to fall to the bottom of the column. Remove the mouthpiece from your mouth and breathe out normally. Rest for a few seconds and repeat Steps 1 through 7 at least 10 times every 1-2 hours when you are awake. Take your time and take a few normal breaths between deep breaths. The spirometer may include an indicator to show your best effort. Use the indicator as a goal to work toward during each repetition. After each set of 10 deep breaths, practice coughing to be sure your lungs are clear. If you have an incision (the cut made at the time of surgery), support your incision when coughing by placing a pillow or rolled up towels firmly against it. Once you are able to get out of bed, walk around indoors and cough well. You may stop using the incentive spirometer when instructed by your caregiver.  RISKS AND COMPLICATIONS Take your time so you do not get dizzy or light-headed. If you are in pain, you may need to take or ask for pain medication before doing incentive spirometry. It is harder to take a deep breath if  you are having pain. AFTER USE Rest and breathe slowly and easily. It can be helpful to keep track of a log of your progress. Your caregiver can provide you with a simple table to help with this. If you are using the spirometer at home, follow these instructions: SEEK MEDICAL CARE IF:  You are having difficultly using the spirometer. You have trouble using the spirometer as often as instructed.  Your pain medication is not giving enough relief while using the spirometer. You develop fever of 100.5 F (38.1 C) or higher. SEEK IMMEDIATE MEDICAL CARE IF:  You cough up bloody sputum that had not been present before. You develop fever of 102 F (38.9 C) or greater. You develop worsening pain at or near the incision site. MAKE SURE YOU:  Understand these instructions. Will watch your condition. Will get help right away if you are not doing well or get worse. Document Released: 10/14/2006 Document Revised: 08/26/2011 Document Reviewed: 12/15/2006 Exit[employer] Patient Information 2014 ExitCare, [place].   ________________________________________________________________________  WHAT IS A BLOOD TRANSFUSION? Blood Transfusion Information  A transfusion is the replacement of blood or some of its parts. Blood is made up of multiple cells which provide different functions. Red blood cells carry oxygen and are used for blood loss replacement. White blood cells fight against infection. Platelets control bleeding. Plasma helps clot blood. Other blood products are available for specialized needs, such as hemophilia or other clotting disorders. BEFORE THE TRANSFUSION  Who gives blood for transfusions?  Healthy volunteers who are fully evaluated to make sure their blood is safe. This is blood bank blood. Transfusion therapy is the safest it has ever been in the practice of medicine. Before blood is taken from a donor, a complete history is taken to make sure that person has no history of diseases nor  engages in risky social behavior (examples are intravenous drug use or sexual activity with multiple partners). The donor's travel history is screened to minimize risk of transmitting infections, such as malaria. The donated blood is tested for signs of infectious diseases, such as HIV and hepatitis. The blood is then tested to be sure it is compatible with you in order to minimize the chance of a transfusion reaction. If you or a relative donates blood, this is often done in anticipation of surgery and is not appropriate for emergency situations. It takes many days to process the donated blood. RISKS AND COMPLICATIONS Although transfusion therapy is very safe and saves many lives, the main dangers of transfusion include:  Getting an infectious disease. Developing a transfusion reaction. This is an allergic reaction to something in the blood you were given. Every precaution is taken to prevent this. The decision to have a blood transfusion has been considered carefully by your caregiver before blood is given. Blood is not given unless the benefits outweigh the risks. AFTER THE TRANSFUSION Right after receiving a blood transfusion, you will usually feel much better and more energetic. This is especially true if your red blood cells have gotten low (anemic). The transfusion raises the level of the red blood cells which carry oxygen, and this usually causes an energy increase. The nurse administering the transfusion will monitor you carefully for complications. HOME CARE INSTRUCTIONS  No special instructions are needed after a transfusion. You may find your energy is better. Speak with your caregiver about any limitations on activity for underlying diseases you may have. SEEK MEDICAL CARE IF:  Your condition is not improving after your transfusion. You develop redness or irritation at the intravenous (IV) site. SEEK IMMEDIATE MEDICAL CARE IF:  Any of the following symptoms occur over the next 12  hours: Shaking chills. You have a temperature by mouth above 102 F (38.9 C), not controlled by medicine. Chest, back, or muscle pain. People around you feel you are not acting correctly or are confused. Shortness of breath or difficulty breathing. Dizziness and fainting. You get a  rash or develop hives. You have a decrease in urine output. Your urine turns a dark color or changes to pink, red, or brown. Any of the following symptoms occur over the next 10 days: You have a temperature by mouth above 102 F (38.9 C), not controlled by medicine. Shortness of breath. Weakness after normal activity. The white part of the eye turns yellow (jaundice). You have a decrease in the amount of urine or are urinating less often. Your urine turns a dark color or changes to pink, red, or brown. Document Released: 05/31/2000 Document Revised: 08/26/2011 Document Reviewed: 01/18/2008 [facility] Patient Information 2014 [place], [place].  _______________________________________________________________________

## 2022-01-23 ENCOUNTER — Other Ambulatory Visit: Payer: Self-pay

## 2022-01-23 ENCOUNTER — Encounter (HOSPITAL_COMMUNITY): Payer: Self-pay

## 2022-01-23 ENCOUNTER — Encounter (HOSPITAL_COMMUNITY)
Admission: RE | Admit: 2022-01-23 | Discharge: 2022-01-23 | Disposition: A | Discharge status: Home / Self Care | Payer: Medicare PPO | Source: Ambulatory Visit | Attending: Orthopedic Surgery | Admitting: Orthopedic Surgery

## 2022-01-23 VITALS — Ht 66.0 in | Wt 147.0 lb

## 2022-01-23 DIAGNOSIS — I2510 Atherosclerotic heart disease of native coronary artery without angina pectoris: Secondary | ICD-10-CM | POA: Diagnosis not present

## 2022-01-23 DIAGNOSIS — Z01812 Encounter for preprocedural laboratory examination: Secondary | ICD-10-CM | POA: Diagnosis present

## 2022-01-23 DIAGNOSIS — Z01818 Encounter for other preprocedural examination: Secondary | ICD-10-CM

## 2022-01-23 NOTE — Progress Notes (Addendum)
Anesthesia note: Pt thought his day of surgery has changed but it is in the computer on 01/23/22 at 11:00 am as being on 8/17 23. He will call Dr's office to verify.  Bowel prep reminder:NA  PCP - Dr. Daneil Dolin Cardiologist -Dr. Maryln Gottron Other-   Chest x-ray - no EKG - 09/11/21-chart Stress Test - no ECHO - 03/16/21-epic Cardiac Cath - no  Card CT 2019  Pacemaker/ICD device last checked:NA  Sleep Study - no CPAP - no  Pt is pre diabetic-NA Fasting Blood Sugar -  Checks Blood Sugar _____  Blood Thinner:Eliquis for A-Fib/ Dr. Milus Mallick Blood Thinner Instructions:none Pt will call his Dr. If its okay to stop 3 days prior to DOS Aspirin Instructions: Last Dose:01/27/22  Anesthesia review: yes  Patient denies shortness of breath, fever, cough and chest pain at PAT appointment Pt reports no SOB with his activity level.  Patient verbalized understanding of instructions that were given to them at the PAT appointment. Patient was also instructed that they will need to review over the PAT instructions again at home before surgery. Yes he will read his instructions when he comes in for labs on Fri 01/25/22. PAT was done by phone.

## 2022-01-25 ENCOUNTER — Encounter (HOSPITAL_COMMUNITY)
Admission: RE | Admit: 2022-01-25 | Discharge: 2022-01-25 | Disposition: A | Discharge status: Home / Self Care | Payer: Medicare PPO | Source: Ambulatory Visit | Attending: Orthopedic Surgery | Admitting: Orthopedic Surgery

## 2022-01-25 DIAGNOSIS — I2510 Atherosclerotic heart disease of native coronary artery without angina pectoris: Secondary | ICD-10-CM | POA: Insufficient documentation

## 2022-01-25 DIAGNOSIS — Z96641 Presence of right artificial hip joint: Secondary | ICD-10-CM | POA: Insufficient documentation

## 2022-01-25 DIAGNOSIS — Z01818 Encounter for other preprocedural examination: Secondary | ICD-10-CM

## 2022-01-25 DIAGNOSIS — Z01812 Encounter for preprocedural laboratory examination: Secondary | ICD-10-CM | POA: Diagnosis present

## 2022-01-25 LAB — CBC
HCT: 40.1 % (ref 39.0–52.0)
Hemoglobin: 12.8 g/dL — ABNORMAL LOW (ref 13.0–17.0)
MCH: 32.1 pg (ref 26.0–34.0)
MCHC: 31.9 g/dL (ref 30.0–36.0)
MCV: 100.5 fL — ABNORMAL HIGH (ref 80.0–100.0)
Platelets: 162 10*3/uL (ref 150–400)
RBC: 3.99 MIL/uL — ABNORMAL LOW (ref 4.22–5.81)
RDW: 14.6 % (ref 11.5–15.5)
WBC: 6.6 10*3/uL (ref 4.0–10.5)
nRBC: 0 % (ref 0.0–0.2)

## 2022-01-25 LAB — BASIC METABOLIC PANEL
Anion gap: 4 — ABNORMAL LOW (ref 5–15)
BUN: 24 mg/dL — ABNORMAL HIGH (ref 8–23)
CO2: 23 mmol/L (ref 22–32)
Calcium: 9.1 mg/dL (ref 8.9–10.3)
Chloride: 112 mmol/L — ABNORMAL HIGH (ref 98–111)
Creatinine, Ser: 1.4 mg/dL — ABNORMAL HIGH (ref 0.61–1.24)
GFR, Estimated: 50 mL/min — ABNORMAL LOW (ref >60)
Glucose, Bld: 96 mg/dL (ref 70–99)
Potassium: 4.3 mmol/L (ref 3.5–5.1)
Sodium: 139 mmol/L (ref 135–145)

## 2022-01-30 NOTE — Anesthesia Preprocedure Evaluation (Signed)
Anesthesia Evaluation  Patient identified by MRN, date of birth, ID band Patient awake    Reviewed: Allergy & Precautions, NPO status , Patient's Chart, lab work & pertinent test results  Airway Mallampati: I  TM Distance: >3 FB Neck ROM: Full    Dental  (+) Dental Advisory Given, Teeth Intact   Pulmonary neg pulmonary ROS,    Pulmonary exam normal breath sounds clear to auscultation       Cardiovascular hypertension, pulmonary hypertension (mod-severe pHTN)+ dysrhythmias (eliquis, cardioversion on 08/02/2021 and has been maintaining sinus rhythm since cardioversion) Atrial Fibrillation + Valvular Problems/Murmurs (mod-severe MR) MR  Rhythm:Regular Rate:Normal  Non-rheumatic mitral regurgitation progressing, currently moderate-severe MR with LV function 55-60% on last echo 2022- being following by cardiology for potential mitral clip, due for repeat echo next month     Neuro/Psych negative neurological ROS  negative psych ROS   GI/Hepatic negative GI ROS, Neg liver ROS,   Endo/Other  negative endocrine ROS  Renal/GU Renal Insufficiency and CRFRenal diseaseCr 1.4  negative genitourinary   Musculoskeletal  (+) Arthritis , Osteoarthritis,  Hx lumbar surgery    Abdominal   Peds  Hematology negative hematology ROS (+) Hb 12.8, plt 162   Anesthesia Other Findings   Reproductive/Obstetrics negative OB ROS                          Anesthesia Physical Anesthesia Plan  ASA: 3  Anesthesia Plan: General   Post-op Pain Management: Tylenol PO (pre-op)*   Induction: Intravenous  PONV Risk Score and Plan: 2 and Ondansetron, Dexamethasone, Midazolam and Treatment may vary due to age or medical condition  Airway Management Planned: Oral ETT  Additional Equipment: Arterial line  Intra-op Plan:   Post-operative Plan: Extubation in OR  Informed Consent: I have reviewed the patients History and  Physical, chart, labs and discussed the procedure including the risks, benefits and alternatives for the proposed anesthesia with the patient or authorized representative who has indicated his/her understanding and acceptance.     Dental advisory given  Plan Discussed with: CRNA  Anesthesia Plan Comments: (05/2021 last hematoma evacuation under GA no issues Will place arterial line in OR prior to induction )       Anesthesia Quick Evaluation

## 2022-01-31 ENCOUNTER — Other Ambulatory Visit: Payer: Self-pay

## 2022-01-31 ENCOUNTER — Ambulatory Visit (HOSPITAL_BASED_OUTPATIENT_CLINIC_OR_DEPARTMENT_OTHER): Payer: Medicare PPO | Admitting: Certified Registered Nurse Anesthetist

## 2022-01-31 ENCOUNTER — Encounter (HOSPITAL_COMMUNITY): Payer: Self-pay | Admitting: Orthopedic Surgery

## 2022-01-31 ENCOUNTER — Observation Stay (HOSPITAL_COMMUNITY)
Admission: RE | Admit: 2022-01-31 | Discharge: 2022-02-01 | Disposition: A | Discharge status: Home / Self Care | Payer: Medicare PPO | Attending: Orthopedic Surgery | Admitting: Orthopedic Surgery

## 2022-01-31 ENCOUNTER — Encounter (HOSPITAL_COMMUNITY)
Admission: RE | Disposition: A | Discharge status: Home / Self Care | Payer: Self-pay | Source: Home / Self Care | Attending: Orthopedic Surgery

## 2022-01-31 ENCOUNTER — Ambulatory Visit (HOSPITAL_COMMUNITY): Payer: Medicare PPO | Admitting: Physician Assistant

## 2022-01-31 DIAGNOSIS — N189 Chronic kidney disease, unspecified: Secondary | ICD-10-CM

## 2022-01-31 DIAGNOSIS — I129 Hypertensive chronic kidney disease with stage 1 through stage 4 chronic kidney disease, or unspecified chronic kidney disease: Secondary | ICD-10-CM | POA: Diagnosis not present

## 2022-01-31 DIAGNOSIS — M96840 Postprocedural hematoma of a musculoskeletal structure following a musculoskeletal system procedure: Secondary | ICD-10-CM | POA: Diagnosis present

## 2022-01-31 DIAGNOSIS — Z96641 Presence of right artificial hip joint: Secondary | ICD-10-CM | POA: Diagnosis not present

## 2022-01-31 DIAGNOSIS — Y831 Surgical operation with implant of artificial internal device as the cause of abnormal reaction of the patient, or of later complication, without mention of misadventure at the time of the procedure: Secondary | ICD-10-CM | POA: Insufficient documentation

## 2022-01-31 DIAGNOSIS — M96842 Postprocedural seroma of a musculoskeletal structure following a musculoskeletal system procedure: Secondary | ICD-10-CM | POA: Diagnosis not present

## 2022-01-31 DIAGNOSIS — S7001XS Contusion of right hip, sequela: Secondary | ICD-10-CM

## 2022-01-31 DIAGNOSIS — I10 Essential (primary) hypertension: Secondary | ICD-10-CM | POA: Diagnosis not present

## 2022-01-31 DIAGNOSIS — S7001XD Contusion of right hip, subsequent encounter: Secondary | ICD-10-CM

## 2022-01-31 HISTORY — PX: HEMATOMA EVACUATION: SHX5118

## 2022-01-31 LAB — TYPE AND SCREEN
ABO/RH(D): A POS
Antibody Screen: NEGATIVE

## 2022-01-31 SURGERY — EVACUATION HEMATOMA
Anesthesia: General | Laterality: Right

## 2022-01-31 MED ORDER — AMIODARONE HCL 100 MG PO TABS: 100.0000 mg | ORAL_TABLET | ORAL | Status: DC

## 2022-01-31 MED ORDER — FERROUS SULFATE 325 (65 FE) MG PO TABS
325.0000 mg | ORAL_TABLET | Freq: Three times a day (TID) | ORAL | Status: DC
  Administered 2022-02-01: 325 mg via ORAL
  Filled 2022-01-31: qty 1

## 2022-01-31 MED ORDER — HYDROMORPHONE HCL 1 MG/ML IJ SOLN: 0.2500 mg | INTRAMUSCULAR | Status: DC | PRN

## 2022-01-31 MED ORDER — FENTANYL CITRATE (PF) 100 MCG/2ML IJ SOLN
INTRAMUSCULAR | Status: DC | PRN
  Administered 2022-01-31 (×2): 50 ug via INTRAVENOUS

## 2022-01-31 MED ORDER — AMIODARONE HCL 100 MG PO TABS
100.0000 mg | ORAL_TABLET | ORAL | Status: DC
  Administered 2022-02-01: 100 mg via ORAL
  Filled 2022-01-31: qty 1

## 2022-01-31 MED ORDER — ROCURONIUM BROMIDE 10 MG/ML (PF) SYRINGE
PREFILLED_SYRINGE | INTRAVENOUS | Status: AC
  Filled 2022-01-31: qty 10

## 2022-01-31 MED ORDER — ONDANSETRON HCL 4 MG/2ML IJ SOLN
INTRAMUSCULAR | Status: DC | PRN
  Administered 2022-01-31: 4 mg via INTRAVENOUS

## 2022-01-31 MED ORDER — CEFAZOLIN SODIUM-DEXTROSE 2-4 GM/100ML-% IV SOLN
2.0000 g | Freq: Four times a day (QID) | INTRAVENOUS | Status: AC
  Administered 2022-01-31 (×2): 2 g via INTRAVENOUS
  Filled 2022-01-31 (×2): qty 100

## 2022-01-31 MED ORDER — EPHEDRINE SULFATE (PRESSORS) 50 MG/ML IJ SOLN
INTRAMUSCULAR | Status: DC | PRN
  Administered 2022-01-31 (×3): 5 mg via INTRAVENOUS

## 2022-01-31 MED ORDER — METHOCARBAMOL 500 MG PO TABS: 500.0000 mg | ORAL_TABLET | Freq: Four times a day (QID) | ORAL | Status: DC | PRN

## 2022-01-31 MED ORDER — METOCLOPRAMIDE HCL 5 MG PO TABS: 5.0000 mg | ORAL_TABLET | Freq: Three times a day (TID) | ORAL | Status: DC | PRN

## 2022-01-31 MED ORDER — FENTANYL CITRATE (PF) 100 MCG/2ML IJ SOLN
INTRAMUSCULAR | Status: AC
  Filled 2022-01-31: qty 2

## 2022-01-31 MED ORDER — DEXAMETHASONE SODIUM PHOSPHATE 10 MG/ML IJ SOLN
10.0000 mg | Freq: Once | INTRAMUSCULAR | Status: AC
  Administered 2022-02-01: 10 mg via INTRAVENOUS
  Filled 2022-01-31: qty 1

## 2022-01-31 MED ORDER — METOCLOPRAMIDE HCL 5 MG/ML IJ SOLN: 5.0000 mg | Freq: Three times a day (TID) | INTRAMUSCULAR | Status: DC | PRN

## 2022-01-31 MED ORDER — DOCUSATE SODIUM 100 MG PO CAPS
100.0000 mg | ORAL_CAPSULE | Freq: Two times a day (BID) | ORAL | Status: DC
  Administered 2022-01-31 – 2022-02-01 (×2): 100 mg via ORAL
  Filled 2022-01-31 (×2): qty 1

## 2022-01-31 MED ORDER — MENTHOL 3 MG MT LOZG: 1.0000 | LOZENGE | OROMUCOSAL | Status: DC | PRN

## 2022-01-31 MED ORDER — ONDANSETRON HCL 4 MG/2ML IJ SOLN
INTRAMUSCULAR | Status: AC
  Filled 2022-01-31: qty 2

## 2022-01-31 MED ORDER — POLYETHYLENE GLYCOL 3350 17 G PO PACK: 17.0000 g | PACK | Freq: Every day | ORAL | Status: DC | PRN

## 2022-01-31 MED ORDER — DORZOLAMIDE HCL-TIMOLOL MAL 2-0.5 % OP SOLN
1.0000 [drp] | Freq: Two times a day (BID) | OPHTHALMIC | Status: DC
  Administered 2022-01-31 – 2022-02-01 (×2): 1 [drp] via OPHTHALMIC
  Filled 2022-01-31: qty 10

## 2022-01-31 MED ORDER — ATORVASTATIN CALCIUM 40 MG PO TABS
40.0000 mg | ORAL_TABLET | Freq: Every day | ORAL | Status: DC
  Administered 2022-02-01: 40 mg via ORAL
  Filled 2022-01-31: qty 1

## 2022-01-31 MED ORDER — ONDANSETRON HCL 4 MG/2ML IJ SOLN: 4.0000 mg | Freq: Four times a day (QID) | INTRAMUSCULAR | Status: DC | PRN

## 2022-01-31 MED ORDER — DEXAMETHASONE SODIUM PHOSPHATE 10 MG/ML IJ SOLN
INTRAMUSCULAR | Status: DC | PRN
  Administered 2022-01-31: 4 mg

## 2022-01-31 MED ORDER — OXYCODONE HCL 5 MG/5ML PO SOLN: 5.0000 mg | Freq: Once | ORAL | Status: AC | PRN

## 2022-01-31 MED ORDER — ONDANSETRON HCL 4 MG PO TABS: 4.0000 mg | ORAL_TABLET | Freq: Four times a day (QID) | ORAL | Status: DC | PRN

## 2022-01-31 MED ORDER — TRANEXAMIC ACID-NACL 1000-0.7 MG/100ML-% IV SOLN
1000.0000 mg | Freq: Once | INTRAVENOUS | Status: AC
  Administered 2022-01-31: 1000 mg via INTRAVENOUS
  Filled 2022-01-31: qty 100

## 2022-01-31 MED ORDER — DEXAMETHASONE SODIUM PHOSPHATE 10 MG/ML IJ SOLN
INTRAMUSCULAR | Status: AC
  Filled 2022-01-31: qty 1

## 2022-01-31 MED ORDER — DIPHENHYDRAMINE HCL 12.5 MG/5ML PO ELIX: 12.5000 mg | ORAL_SOLUTION | ORAL | Status: DC | PRN

## 2022-01-31 MED ORDER — LACTATED RINGERS IV SOLN: INTRAVENOUS | Status: DC

## 2022-01-31 MED ORDER — DEXAMETHASONE SODIUM PHOSPHATE 10 MG/ML IJ SOLN: 8.0000 mg | Freq: Once | INTRAMUSCULAR | Status: DC

## 2022-01-31 MED ORDER — PANTOPRAZOLE SODIUM 40 MG PO TBEC
40.0000 mg | DELAYED_RELEASE_TABLET | Freq: Every day | ORAL | Status: DC
  Administered 2022-02-01: 40 mg via ORAL
  Filled 2022-01-31: qty 1

## 2022-01-31 MED ORDER — PROPOFOL 10 MG/ML IV BOLUS
INTRAVENOUS | Status: AC
  Filled 2022-01-31: qty 20

## 2022-01-31 MED ORDER — IPRATROPIUM BROMIDE 0.06 % NA SOLN: 2.0000 | Freq: Every day | NASAL | Status: DC | PRN

## 2022-01-31 MED ORDER — PHENOL 1.4 % MT LIQD: 1.0000 | OROMUCOSAL | Status: DC | PRN

## 2022-01-31 MED ORDER — PROPOFOL 10 MG/ML IV BOLUS
INTRAVENOUS | Status: DC | PRN
  Administered 2022-01-31: 80 mg via INTRAVENOUS

## 2022-01-31 MED ORDER — TRANEXAMIC ACID-NACL 1000-0.7 MG/100ML-% IV SOLN
1000.0000 mg | INTRAVENOUS | Status: AC
  Administered 2022-01-31: 1000 mg via INTRAVENOUS
  Filled 2022-01-31: qty 100

## 2022-01-31 MED ORDER — DORZOLAMIDE HCL-TIMOLOL MAL PF 22.3-6.8 MG/ML OP SOLN
1.0000 [drp] | Freq: Two times a day (BID) | OPHTHALMIC | Status: DC
Start: 2022-01-31 — End: 2022-01-31

## 2022-01-31 MED ORDER — TRANEXAMIC ACID 650 MG PO TABS
1950.0000 mg | ORAL_TABLET | Freq: Every day | ORAL | Status: AC
Start: 2022-02-01 — End: 2022-02-01
  Administered 2022-02-01: 1950 mg via ORAL
  Filled 2022-01-31: qty 3

## 2022-01-31 MED ORDER — HYDROCODONE-ACETAMINOPHEN 7.5-325 MG PO TABS: 1.0000 | ORAL_TABLET | ORAL | Status: DC | PRN

## 2022-01-31 MED ORDER — CEFADROXIL 500 MG PO CAPS
500.0000 mg | ORAL_CAPSULE | Freq: Two times a day (BID) | ORAL | Status: DC
  Administered 2022-02-01: 500 mg via ORAL
  Filled 2022-01-31: qty 1

## 2022-01-31 MED ORDER — CEFAZOLIN SODIUM-DEXTROSE 2-4 GM/100ML-% IV SOLN
2.0000 g | INTRAVENOUS | Status: AC
  Administered 2022-01-31: 2 g via INTRAVENOUS
  Filled 2022-01-31: qty 100

## 2022-01-31 MED ORDER — SUCCINYLCHOLINE CHLORIDE 200 MG/10ML IV SOSY
PREFILLED_SYRINGE | INTRAVENOUS | Status: DC | PRN
  Administered 2022-01-31: 120 mg via INTRAVENOUS

## 2022-01-31 MED ORDER — ACETAMINOPHEN 325 MG PO TABS
325.0000 mg | ORAL_TABLET | Freq: Four times a day (QID) | ORAL | Status: DC | PRN
  Administered 2022-01-31: 650 mg via ORAL
  Filled 2022-01-31: qty 2

## 2022-01-31 MED ORDER — ACETAMINOPHEN 500 MG PO TABS
1000.0000 mg | ORAL_TABLET | Freq: Once | ORAL | Status: AC
  Administered 2022-01-31: 1000 mg via ORAL
  Filled 2022-01-31: qty 2

## 2022-01-31 MED ORDER — ORAL CARE MOUTH RINSE: 15.0000 mL | Freq: Once | OROMUCOSAL | Status: AC

## 2022-01-31 MED ORDER — SODIUM CHLORIDE 0.9 % IV SOLN: INTRAVENOUS | Status: DC

## 2022-01-31 MED ORDER — FUROSEMIDE 20 MG PO TABS: 20.0000 mg | ORAL_TABLET | Freq: Every day | ORAL | Status: DC | PRN

## 2022-01-31 MED ORDER — HYDROCODONE-ACETAMINOPHEN 5-325 MG PO TABS: 1.0000 | ORAL_TABLET | ORAL | Status: DC | PRN

## 2022-01-31 MED ORDER — CHLORHEXIDINE GLUCONATE 0.12 % MT SOLN
15.0000 mL | Freq: Once | OROMUCOSAL | Status: AC
  Administered 2022-01-31: 15 mL via OROMUCOSAL

## 2022-01-31 MED ORDER — ZOLPIDEM TARTRATE 5 MG PO TABS
5.0000 mg | ORAL_TABLET | Freq: Every evening | ORAL | Status: DC | PRN
  Administered 2022-01-31: 5 mg via ORAL
  Filled 2022-01-31: qty 1

## 2022-01-31 MED ORDER — MORPHINE SULFATE (PF) 2 MG/ML IV SOLN: 0.5000 mg | INTRAVENOUS | Status: DC | PRN

## 2022-01-31 MED ORDER — BISACODYL 10 MG RE SUPP: 10.0000 mg | Freq: Every day | RECTAL | Status: DC | PRN

## 2022-01-31 MED ORDER — PHENYLEPHRINE HCL (PRESSORS) 10 MG/ML IV SOLN
INTRAVENOUS | Status: DC | PRN
  Administered 2022-01-31: 80 ug via INTRAVENOUS

## 2022-01-31 MED ORDER — POTASSIUM CHLORIDE CRYS ER 10 MEQ PO TBCR
10.0000 meq | EXTENDED_RELEASE_TABLET | ORAL | Status: DC
  Administered 2022-02-01: 10 meq via ORAL
  Filled 2022-01-31 (×2): qty 1

## 2022-01-31 MED ORDER — SODIUM CHLORIDE 0.9 % IR SOLN
Status: DC | PRN
  Administered 2022-01-31: 3000 mL

## 2022-01-31 MED ORDER — VALACYCLOVIR HCL 500 MG PO TABS: 1000.0000 mg | ORAL_TABLET | Freq: Every day | ORAL | Status: DC | PRN

## 2022-01-31 MED ORDER — ONDANSETRON HCL 4 MG/2ML IJ SOLN: 4.0000 mg | Freq: Once | INTRAMUSCULAR | Status: DC | PRN

## 2022-01-31 MED ORDER — METHOCARBAMOL 1000 MG/10ML IJ SOLN: 500.0000 mg | Freq: Four times a day (QID) | INTRAVENOUS | Status: DC | PRN

## 2022-01-31 MED ORDER — OXYCODONE HCL 5 MG PO TABS
5.0000 mg | ORAL_TABLET | Freq: Once | ORAL | Status: AC | PRN
  Administered 2022-01-31: 5 mg via ORAL

## 2022-01-31 MED ORDER — POTASSIUM CHLORIDE CRYS ER 20 MEQ PO TBCR: 20.0000 meq | EXTENDED_RELEASE_TABLET | ORAL | Status: DC

## 2022-01-31 MED ORDER — APIXABAN 2.5 MG PO TABS
2.5000 mg | ORAL_TABLET | Freq: Two times a day (BID) | ORAL | Status: DC
  Administered 2022-02-01: 2.5 mg via ORAL
  Filled 2022-01-31: qty 1

## 2022-01-31 MED ORDER — LIDOCAINE HCL (PF) 2 % IJ SOLN
INTRAMUSCULAR | Status: AC
  Filled 2022-01-31: qty 5

## 2022-01-31 MED ORDER — OXYCODONE HCL 5 MG PO TABS
ORAL_TABLET | ORAL | Status: AC
  Filled 2022-01-31: qty 1

## 2022-01-31 MED ORDER — AMISULPRIDE (ANTIEMETIC) 5 MG/2ML IV SOLN: 10.0000 mg | Freq: Once | INTRAVENOUS | Status: DC | PRN

## 2022-01-31 SURGICAL SUPPLY — 40 items
BAG COUNTER SPONGE SURGICOUNT (BAG) IMPLANT
BAG ZIPLOCK 12X15 (MISCELLANEOUS) ×1 IMPLANT
BLADE SAW SGTL 11.0X1.19X90.0M (BLADE) IMPLANT
COVER SURGICAL LIGHT HANDLE (MISCELLANEOUS) ×1 IMPLANT
DERMABOND ADVANCED (GAUZE/BANDAGES/DRESSINGS) ×1
DERMABOND ADVANCED .7 DNX12 (GAUZE/BANDAGES/DRESSINGS) IMPLANT
DRAIN PENROSE 0.5X18 (DRAIN) IMPLANT
DRESSING MEPILEX FLEX 4X4 (GAUZE/BANDAGES/DRESSINGS) IMPLANT
DRSG AQUACEL AG ADV 3.5X 6 (GAUZE/BANDAGES/DRESSINGS) IMPLANT
DRSG AQUACEL AG ADV 3.5X10 (GAUZE/BANDAGES/DRESSINGS) IMPLANT
DRSG MEPILEX FLEX 4X4 (GAUZE/BANDAGES/DRESSINGS) ×1
DRSG PAD ABDOMINAL 8X10 ST (GAUZE/BANDAGES/DRESSINGS) IMPLANT
DURAPREP 26ML APPLICATOR (WOUND CARE) ×1 IMPLANT
ELECT REM PT RETURN 15FT ADLT (MISCELLANEOUS) ×1 IMPLANT
EVACUATOR 1/8 PVC DRAIN (DRAIN) IMPLANT
GAUZE SPONGE 4X4 12PLY STRL (GAUZE/BANDAGES/DRESSINGS) IMPLANT
GAUZE XEROFORM 1X8 LF (GAUZE/BANDAGES/DRESSINGS) IMPLANT
GLOVE BIOGEL PI IND STRL 6.5 (GLOVE) ×1 IMPLANT
GLOVE BIOGEL PI IND STRL 7.5 (GLOVE) ×3 IMPLANT
GLOVE BIOGEL PI INDICATOR 6.5 (GLOVE) ×1
GLOVE BIOGEL PI INDICATOR 7.5 (GLOVE) ×3
GLOVE BIOGEL PI ORTHO PRO 7.5 (GLOVE) ×1
GLOVE ECLIPSE 6.0 STRL STRAW (GLOVE) ×1 IMPLANT
GLOVE INDICATOR 6.5 STRL GRN (GLOVE) ×1 IMPLANT
GLOVE PI ORTHO PRO STRL 7.5 (GLOVE) ×1 IMPLANT
GOWN STRL REUS W/ TWL LRG LVL3 (GOWN DISPOSABLE) ×2 IMPLANT
GOWN STRL REUS W/TWL LRG LVL3 (GOWN DISPOSABLE) ×2
HANDPIECE INTERPULSE COAX TIP (DISPOSABLE) ×1
KIT BASIN OR (CUSTOM PROCEDURE TRAY) ×1 IMPLANT
KIT TURNOVER KIT A (KITS) IMPLANT
MANIFOLD NEPTUNE II (INSTRUMENTS) ×1 IMPLANT
PACK ORTHO EXTREMITY (CUSTOM PROCEDURE TRAY) ×1 IMPLANT
PROTECTOR NERVE ULNAR (MISCELLANEOUS) ×1 IMPLANT
SET HNDPC FAN SPRY TIP SCT (DISPOSABLE) ×1 IMPLANT
SOLUTION PRONTOSAN WOUND 350ML (IRRIGATION / IRRIGATOR) IMPLANT
SUT MNCRL AB 4-0 PS2 18 (SUTURE) IMPLANT
SUT VIC AB 2-0 CT1 27 (SUTURE) ×1
SUT VIC AB 2-0 CT1 TAPERPNT 27 (SUTURE) IMPLANT
SYR CONTROL 10ML LL (SYRINGE) ×1 IMPLANT
TOWEL OR 17X26 10 PK STRL BLUE (TOWEL DISPOSABLE) ×2 IMPLANT

## 2022-01-31 NOTE — Transfer of Care (Signed)
Immediate Anesthesia Transfer of Care Note  Patient: Travis Wolfe  Procedure(s) Performed: Evacuation of right hip hematoma (Right)  Patient Location: PACU  Anesthesia Type:General  Level of Consciousness: awake and patient cooperative  Airway & Oxygen Therapy: Patient Spontanous Breathing and Patient connected to face mask oxygen  Post-op Assessment: Report given to RN and Post -op Vital signs reviewed and stable  Post vital signs: Reviewed and stable  Last Vitals:  Vitals Value Taken Time  BP 161/80 01/31/22 1147  Temp    Pulse 47 01/31/22 1150  Resp 14 01/31/22 1150  SpO2 100 % 01/31/22 1150  Vitals shown include unvalidated device data.  Last Pain:  Vitals:   01/31/22 0917  TempSrc:   PainSc: 0-No pain      Patients Stated Pain Goal: 3 (01/31/22 0917)  Complications: No notable events documented.

## 2022-01-31 NOTE — Anesthesia Procedure Notes (Addendum)
Procedure Name: Intubation Date/Time: 01/31/2022 10:33 AM  Performed by: West Pugh, CRNAPre-anesthesia Checklist: Patient identified, Emergency Drugs available, Suction available, Patient being monitored and Timeout performed Patient Re-evaluated:Patient Re-evaluated prior to induction Oxygen Delivery Method: Circle system utilized Preoxygenation: Pre-oxygenation with 100% oxygen Induction Type: IV induction Ventilation: Mask ventilation without difficulty Laryngoscope Size: Mac and 4 Grade View: Grade I Tube type: Oral Tube size: 7.0 mm Number of attempts: 1 Airway Equipment and Method: Stylet Placement Confirmation: ETT inserted through vocal cords under direct vision, positive ETCO2, CO2 detector and breath sounds checked- equal and bilateral Secured at: 23 cm Tube secured with: Tape (paper) Dental Injury: Teeth and Oropharynx as per pre-operative assessment

## 2022-01-31 NOTE — Discharge Instructions (Addendum)
INSTRUCTIONS AFTER SURGERY  Discuss Eliquis with your Cardiologist: If they feel 2.5 mg BID is appropriate for a short period of time, you may do so. Otherwise, continue with 5 mg BID.  Drain Instructions: - Empty drain every 8-12 hours as needed depending on how much fluid/blood accumulates  - We will keep the drain in place until follow up (next week), unless it completely stops accumulating fluid. If this is the case, call the office and we will instruct you. - if the drain falls out, you may cover with a dressing which your nurse will have sent with you.   Remove items at home which could result in a fall. This includes throw rugs or furniture in walking pathways ICE to the affected joint every three hours while awake for 30 minutes at a time, for at least the first 3-5 days, and then as needed for pain and swelling.  Continue to use ice for pain and swelling. You may notice swelling that will progress down to the foot and ankle.  This is normal after surgery.  Elevate your leg when you are not up walking on it.   Continue to use the breathing machine you got in the hospital (incentive spirometer) which will help keep your temperature down.  It is common for your temperature to cycle up and down following surgery, especially at night when you are not up moving around and exerting yourself.  The breathing machine keeps your lungs expanded and your temperature down.   DIET:  As you were doing prior to hospitalization, we recommend a well-balanced diet.  DRESSING / WOUND CARE / SHOWERING  Keep the surgical dressing until follow up.  The dressing is water proof, so you can shower without any extra covering.  IF THE DRESSING FALLS OFF or the wound gets wet inside, change the dressing with sterile gauze.  Please use good hand washing techniques before changing the dressing.  Do not use any lotions or creams on the incision until instructed by your surgeon.    ACTIVITY  Increase activity slowly  as tolerated, but follow the weight bearing instructions below.   No driving for 6 weeks or until further direction given by your physician.  You cannot drive while taking narcotics.  No lifting or carrying greater than 10 lbs. until further directed by your surgeon. Avoid periods of inactivity such as sitting longer than an hour when not asleep. This helps prevent blood clots.  You may return to work once you are authorized by your doctor.     WEIGHT BEARING   Weight bearing as tolerated with assist device (walker, cane, etc) as directed, use it as long as suggested by your surgeon or therapist, typically at least 4-6 weeks.  CONSTIPATION  Constipation is defined medically as fewer than three stools per week and severe constipation as less than one stool per week.  Even if you have a regular bowel pattern at home, your normal regimen is likely to be disrupted due to multiple reasons following surgery.  Combination of anesthesia, postoperative narcotics, change in appetite and fluid intake all can affect your bowels.   YOU MUST use at least one of the following options; they are listed in order of increasing strength to get the job done.  They are all available over the counter, and you may need to use some, POSSIBLY even all of these options:    Drink plenty of fluids (prune juice may be helpful) and high fiber foods Colace 100 mg  by mouth twice a day  Senokot for constipation as directed and as needed Dulcolax (bisacodyl), take with full glass of water  Miralax (polyethylene glycol) once or twice a day as needed.  If you have tried all these things and are unable to have a bowel movement in the first 3-4 days after surgery call either your surgeon or your primary doctor.    If you experience loose stools or diarrhea, hold the medications until you stool forms back up.  If your symptoms do not get better within 1 week or if they get worse, check with your doctor.  If you experience "the  worst abdominal pain ever" or develop nausea or vomiting, please contact the office immediately for further recommendations for treatment.   ITCHING:  If you experience itching with your medications, try taking only a single pain pill, or even half a pain pill at a time.  You can also use Benadryl over the counter for itching or also to help with sleep.   TED HOSE STOCKINGS:  Use stockings on both legs until for at least 2 weeks or as directed by physician office. They may be removed at night for sleeping.  MEDICATIONS:  See your medication summary on the "After Visit Summary" that nursing will review with you.  You may have some home medications which will be placed on hold until you complete the course of blood thinner medication.  It is important for you to complete the blood thinner medication as prescribed.  PRECAUTIONS:  If you experience chest pain or shortness of breath - call 911 immediately for transfer to the hospital emergency department.   If you develop a fever greater that 101 F, purulent drainage from wound, increased redness or drainage from wound, foul odor from the wound/dressing, or calf pain - CONTACT YOUR SURGEON.                                                   FOLLOW-UP APPOINTMENTS:  If you do not already have a post-op appointment, please call the office for an appointment to be seen by your surgeon.  Guidelines for how soon to be seen are listed in your "After Visit Summary", but are typically between 1-4 weeks after surgery.  POST-OPERATIVE OPIOID TAPER INSTRUCTIONS: It is important to wean off of your opioid medication as soon as possible. If you do not need pain medication after your surgery it is ok to stop day one. Opioids include: Codeine, Hydrocodone(Norco, Vicodin), Oxycodone(Percocet, oxycontin) and hydromorphone amongst others.  Long term and even short term use of opiods can cause: Increased pain response Dependence Constipation Depression Respiratory  depression And more.  Withdrawal symptoms can include Flu like symptoms Nausea, vomiting And more Techniques to manage these symptoms Hydrate well Eat regular healthy meals Stay active Use relaxation techniques(deep breathing, meditating, yoga) Do Not substitute Alcohol to help with tapering If you have been on opioids for less than two weeks and do not have pain than it is ok to stop all together.  Plan to wean off of opioids This plan should start within one week post op of your joint replacement. Maintain the same interval or time between taking each dose and first decrease the dose.  Cut the total daily intake of opioids by one tablet each day Next start to increase the time between doses.  The last dose that should be eliminated is the evening dose.   MAKE SURE YOU:  Understand these instructions.  Get help right away if you are not doing well or get worse.    Thank you for letting us be a part of your medical care team.  It is a privilege we respect greatly.  We hope these instructions will help you stay on track for a fast and full recovery!

## 2022-01-31 NOTE — H&P (Signed)
Travis Wolfe is an 87 y.o. male.   Chief Complaint: Right hip swelling HPI:  Travis Wolfe has a complex surgical history involving his right hip. He has a remote history of cemented right total hip arthroplasty, which failed and was converted to press fit total hip with metal on metal components. 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 He has has multiple procedures now to attempt to resolve his persistent lateral hip seroma. Unfortunately, he again has been dealing with recurrent fluid collections requiring frequent aspiration in the office. There has been no evidence of infection.   Past Medical History:  Diagnosis Date   A-fib (HCC)    REPORTS WAS DUE TO DEHYDRATION 3 YEARS AGO BUT WAS GIVEN FLUIDS IN ED AND MEDS TO SLOW HR AND DROVE HIMSELF HOME  ; HAD ABLATION X2 WITH CARDIO DR. AKBARY AS PRECAUTION  ; has been on eliquis since but reports he has not taken it for 5 weeks b/c he was told to hold it for the multiple irrigations and debridements for the hip infection. reports his cardiologist is aware he has been holding it;    Arthritis    Atherosclerosis of both carotid arteries    BPH (benign prostatic hyperplasia)    Dysrhythmia    a-fib   Fall    fell on friday 01-16-18;   lost balAnce whIile tryng to get his sock on ;sustained skin tear to left wrist ; no drainage  , scabbed over , and has been covering with bandage    Heart murmur    SINCE HIS 20s   HLD (hyperlipidemia)    Hypertension    Non-rheumatic mitral regurgitation     Past Surgical History:  Procedure Laterality Date   BACK SURGERY  2007   L1-L5 LUMBAR WITH HARDWARE PLACED   CARDIOVERSION     CATARACT EXTRACTION, BILATERAL  2015   CRYOABLATION  2023   CARDIAC ABLATION and 2019   DACROCYSTORHINOSTOMY  07/26/2011   HERNIA REPAIR Bilateral    hx of echocardiogram      INCISION AND DRAINAGE HIP Right 01/26/2018   Procedure: Right hip irrigation and debridement, excisional and non excisional  debridement, head liner exchange, posterior approach;  Surgeon: Durene Romans, MD;  Location: [facility];  Service: Orthopedics;  Laterality: Right;  90 mins Would like to start earlier around 2:00pm if time opens   INCISION AND DRAINAGE HIP Right 11/26/2018   Procedure: IRRIGATION AND DEBRIDEMENT HIP;  Surgeon: Durene Romans, MD;  Location: [facility];  Service: Orthopedics;  Laterality: Right;  90 mins   INCISION AND DRAINAGE HIP Right 05/17/2021   Procedure: IRRIGATION AND DEBRIDEMENT RIGHT HIP, EVACUATION OF PSEUDOTUMOR;  Surgeon: Durene Romans, MD;  Location: [facility];  Service: Orthopedics;  Laterality: Right;   INGUINAL HERNIA REPAIR Bilateral    with mesh    IR US GUIDE BX ASP/DRAIN  12/17/2017   IRRIGATION AND ASPIRATION RIGHT HIP   12/10/2017   [facility]  x2, Edie x1    KIDNEY STONE SURGERY     lithotripsy    KYPHOPLASTY     T7   LATERAL FUSION LUMBAR SPINE     L1-L5   PULMONARY VEIN ISOLATION AND LEFT ATRIAL ROOFLINE ABLATION   10/15/2017   DR Rudolpho Sevin    ROTATOR CUFF REPAIR Left    SHOULDER DEBRIDEMENT Right    SHOULDER OPEN ROTATOR CUFF REPAIR Left    SHOULDER SURGERY  1988   arthroscopy     TONSILLECTOMY  TOTAL HIP ARTHROPLASTY Right 2003   TOTAL HIP ARTHROPLASTY  2010   TOTAL HIP REVISION      05-2017, 10-2016    Family History  Problem Relation Age of Onset   Hypertension Mother    Cancer Mother    Social History:  reports that he has never smoked. He has never used smokeless tobacco. He reports current alcohol use of about 3.0 standard drinks of alcohol per week. He reports that he does not use drugs.  Allergies:  Allergies  Allergen Reactions   Other     Cat dander   Doxycycline Rash   Horse Epithelium Rash    No medications prior to admission.    No results found for this or any previous visit (from the past 48 hour(s)). No results found.  Review of Systems  Constitutional:  Negative for chills and fever.  Respiratory:  Negative for cough and  shortness of breath.   Cardiovascular:  Negative for chest pain.  Gastrointestinal:  Negative for nausea and vomiting.  Musculoskeletal:  Positive for arthralgias.      There were no vitals taken for this visit. Physical Exam  Very pleasant 87 year old male awake alert and oriented. He is in no acute distress. He walks in the office with a mild limp favoring his right hip. In general he does not use an assist device.  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 We will place a hemovac drain.   We reviewed risks, benefits, and expectations of surgery and the patient does wish to proceed.   We will likely plan to keep him in the hospital while the drain is in place.   Cassandria Anger, PA-C 01/31/2022, 7:03 AM

## 2022-01-31 NOTE — Anesthesia Procedure Notes (Signed)
Arterial Line Insertion Start/End8/17/2023 10:22 AM, 01/31/2022 10:26 AM Performed by: Val Eagle, MD, anesthesiologist  Patient location: OR. Preanesthetic checklist: patient identified, IV checked, site marked, risks and benefits discussed, surgical consent, monitors and equipment checked, pre-op evaluation, timeout performed and anesthesia consent Patient sedated Right, radial was placed Catheter size: 20 G Hand hygiene performed  and maximum sterile barriers used   Attempts: 1 Procedure performed without using ultrasound guided technique. Following insertion, dressing applied. Post procedure assessment: normal and unchanged  Patient tolerated the procedure well with no immediate complications.

## 2022-01-31 NOTE — Interval H&P Note (Signed)
History and Physical Interval Note:  01/31/2022 8:45 AM  Travis Wolfe  has presented today for surgery, with the diagnosis of Right hip hematoma, status post right total hip revision.  The various methods of treatment have been discussed with the patient and family. After consideration of risks, benefits and other options for treatment, the patient has consented to  Procedure(s) with comments: Evacuation of right hip hematoma (Right) - 90 mins as a surgical intervention.  The patient's history has been reviewed, patient examined, no change in status, stable for surgery.  I have reviewed the patient's chart and labs.  Questions were answered to the patient's satisfaction.     [name]

## 2022-01-31 NOTE — Brief Op Note (Signed)
01/31/2022  11:23 AM  PATIENT:  Travis Wolfe  87 y.o. male  PRE-OPERATIVE DIAGNOSIS:  Right hip hematoma, status post right total hip revision  POST-OPERATIVE DIAGNOSIS:  Right hip hematoma, status post right total hip revision  PROCEDURE:  Procedure(s) with comments: Evacuation of right hip hematoma (Right) - 90 mins  SURGEON:  Surgeon(s) and Role:    Durene Romans, MD - Primary  PHYSICIAN ASSISTANT: Rosalene Billings, PA-C  ANESTHESIA:   general  EBL:  25 mL   BLOOD ADMINISTERED:none  DRAINS: (1 medium) Hemovact drain(s) in the right hip with  Suction Open   LOCAL MEDICATIONS USED:  NONE  SPECIMEN:  No Specimen  DISPOSITION OF SPECIMEN:  N/A  COUNTS:  YES  TOURNIQUET:  * No tourniquets in log *  DICTATION: .Other Dictation: Dictation Number [id number]  PLAN OF CARE: Admit for overnight observation  PATIENT DISPOSITION:  PACU - hemodynamically stable.   Delay start of Pharmacological VTE agent (>24hrs) due to surgical blood loss or risk of bleeding: no

## 2022-01-31 NOTE — Op Note (Signed)
NAME: Travis Wolfe, Travis Wolfe MEDICAL RECORD NO: [id number] ACCOUNT NO: [id number] DATE OF BIRTH: Jul 18, 1934 FACILITY: [facility]ATION: [id number] PHYSICIAN: Madlyn Frankel. Charlann Boxer, MD  Operative Report   DATE OF PROCEDURE: 01/31/2022  PREOPERATIVE DIAGNOSIS:  Recurrent right hip hematoma and seroma following revision right total hip arthroplasty.  POSTOPERATIVE DIAGNOSIS:  Recurrent right hip hematoma and seroma following revision right total hip arthroplasty.  PROCEDURE: 1.  Right hip evacuation of hematoma. 2.  Non-excisional debridement of right hip with 3 liters of normal saline solution as well as Prontosan antimicrobial fluid.  SURGEON:  Madlyn Frankel. Charlann Boxer, MD  ASSISTANT:  Rosalene Billings, PA-C.  Note that Ms. Domenic Schwab was present the entirety of the case from preoperative positioning, perioperative management of the operative extremity, general facilitation of the case and primary wound closure.  ANESTHESIA:  General.  BLOOD LOSS:  Minimal.  DRAINS:  One medium Hemovac drain was placed into the drain deep.  INDICATIONS FOR THE PROCEDURE:  The patient is a very pleasant 87 year old male with a complex right total hip arthroplasty history that included metal-on-metal components requiring revision.  He has had recurrent bouts of swelling over the lateral side  of the hip, which we attempted to aspirate multiple times in the office.  I have been unsuccessful of evacuating all of the blood from his hip due to concerns for hematoma being present.  We reviewed going to the operating room to evacuate this again to  attempt to decompress.  We discussed using a drain and keeping the drain in place until all the fluid had ceased that may necessitate going home with a drain.  He wished to proceed in this fashion.  The risk of recurrence of hematoma and infection  discussed and reviewed.  Consent was obtained for management of the hematoma.  DESCRIPTION OF PROCEDURE:  The patient was brought to the operative  theater.  Once adequate anesthesia, preoperative antibiotics, Ancef administered as well as tranexamic acid.  He was positioned into the left lateral decubitus position with the right  hip up.  The right lower extremity was then prepped and draped in sterile fashion.  A timeout was performed identifying the patient, planned procedure, and extremity.  I carefully made an incision on the mid to distal portion of his incision, excising  some skin.  As I gently penetrated the skin layer there was obvious seroma in this area that we were able to suction out.  There were no signs of infection.  As we evacuated some of the fluid I was able to excise and extend the incision deep.  We  encountered a very large hematoma, which was evacuated and fully decompressed from the posterior out the lateral aspect.  Once we had evacuated all the hematoma we did identify his lateral iliotibial band and gluteal fascia are not really present and  there was exposure down to the joint.  We irrigated the hip with 3 liters of normal saline solution followed by Prontosan antimicrobial fluid.  I placed a medium Hemovac drain deep.  The wound was closed in layers over top of the Hemovac drain.  The  wound was clean, dry and dressed sterilely using surgical glue and Aquacel dressing.  He will be admitted to the hospital with observation of his Hemovac drain and decisions on the Hemovac drain output determines whether or not it will be pulled tomorrow  prior to discharge or at home.  We will see him back in the office in 2 weeks.   PUS D:  01/31/2022 11:29:37 am T: 01/31/2022 3:38:00 pm  JOB: [id number]/ [id number]

## 2022-02-01 ENCOUNTER — Encounter (HOSPITAL_COMMUNITY): Payer: Self-pay | Admitting: Orthopedic Surgery

## 2022-02-01 DIAGNOSIS — M96840 Postprocedural hematoma of a musculoskeletal structure following a musculoskeletal system procedure: Secondary | ICD-10-CM | POA: Diagnosis not present

## 2022-02-01 LAB — CBC
HCT: 33.6 % — ABNORMAL LOW (ref 39.0–52.0)
Hemoglobin: 11.1 g/dL — ABNORMAL LOW (ref 13.0–17.0)
MCH: 32.6 pg (ref 26.0–34.0)
MCHC: 33 g/dL (ref 30.0–36.0)
MCV: 98.8 fL (ref 80.0–100.0)
Platelets: 132 10*3/uL — ABNORMAL LOW (ref 150–400)
RBC: 3.4 MIL/uL — ABNORMAL LOW (ref 4.22–5.81)
RDW: 14.2 % (ref 11.5–15.5)
WBC: 7.7 10*3/uL (ref 4.0–10.5)
nRBC: 0 % (ref 0.0–0.2)

## 2022-02-01 LAB — BASIC METABOLIC PANEL
Anion gap: 4 — ABNORMAL LOW (ref 5–15)
BUN: 20 mg/dL (ref 8–23)
CO2: 24 mmol/L (ref 22–32)
Calcium: 8.2 mg/dL — ABNORMAL LOW (ref 8.9–10.3)
Chloride: 111 mmol/L (ref 98–111)
Creatinine, Ser: 1.16 mg/dL (ref 0.61–1.24)
GFR, Estimated: >60 mL/min (ref >60)
Glucose, Bld: 133 mg/dL — ABNORMAL HIGH (ref 70–99)
Potassium: 4.1 mmol/L (ref 3.5–5.1)
Sodium: 139 mmol/L (ref 135–145)

## 2022-02-01 MED ORDER — TRANEXAMIC ACID 650 MG PO TABS: 1950.0000 mg | ORAL_TABLET | Freq: Every day | ORAL | 0 refills | Status: AC

## 2022-02-01 MED ORDER — ACETAMINOPHEN 325 MG PO TABS: 1000.0000 mg | ORAL_TABLET | Freq: Four times a day (QID) | ORAL | Status: DC | PRN

## 2022-02-01 MED ORDER — CEFADROXIL 500 MG PO CAPS: 500.0000 mg | ORAL_CAPSULE | Freq: Two times a day (BID) | ORAL | 0 refills | Status: AC

## 2022-02-01 NOTE — Progress Notes (Signed)
PT Cancellation Note  Patient Details Name: Travis Wolfe MRN: [id number] DOB: 1935-02-02   Cancelled Treatment:    Reason Eval/Treat Not Completed: PT screened, no needs identified, will sign off Pt reports numerous hip surgeries and declines PT at this time.  Pt reports he will f/u with OPPT.  Pt feels ready to d/c home and again declines acute PT at this time.    Janan Halter [place] 02/01/2022, 9:47 AM Thomasene Mohair PT, DPT Physical Therapist [facility] Preferred contact method: Secure Chat Weekend Pager Only: [phone] Office: [phone]

## 2022-02-01 NOTE — Plan of Care (Signed)
Pt ready to DC home with wife who is an Charity fundraiser. Verbalized understanding of care of hemovac, and pt to be seen in office in 1-2 days per Rosalene Billings PA for drain check.

## 2022-02-01 NOTE — TOC Transition Note (Signed)
Transition of Care (TOC) - CM/SW Discharge Note   Patient Details  Name: Travis Wolfe MRN: [id number] Date of Birth: 02/25/1935  Transition of Care (TOC) CM/SW Contact:  , , LCSW Phone Number: 02/01/2022, 10:25 AM   Clinical Narrative:    Met with pt who reports that he has all needed DME at home.  Plans to set up OPPT on his own and as he feels it's needed.  Denies any TOC needs.   Final next level of care: Home/Self Care Barriers to Discharge: No Barriers Identified   Patient Goals and CMS Choice Patient states their goals for this hospitalization and ongoing recovery are:: return home      Discharge Placement                       Discharge Plan and Services                DME Arranged: N/A DME Agency: NA                  Social Determinants of Health (SDOH) Interventions     Readmission Risk Interventions     No data to display             

## 2022-02-01 NOTE — Progress Notes (Signed)
Subjective: 1 Day Post-Op Procedure(s) (LRB): Evacuation of right hip hematoma (Right) Patient reports pain as 1/10.   Patient seen in rounds for Dr. Charlann Boxer. Patient is resting in bed on exam. He is in good spirits. He notices his hip looks much better. He tells me that his wife is an Charity fundraiser, and they have managed a hemovac at home before comfortably. He would like to go home today. We will start therapy today.   Objective: Vital signs in last 24 hours: Temp:  [96.3 F (35.7 C)-98.2 F (36.8 C)] 97.7 F (36.5 C) (08/18 0559) Pulse Rate:  [43-52] 50 (08/18 0559) Resp:  [10-18] 18 (08/18 0559) BP: (123-165)/(60-80) 123/61 (08/18 0559) SpO2:  [93 %-100 %] 99 % (08/18 0559) Arterial Line BP: (163-165)/(56-58) 163/56 (08/17 1215) Weight:  [66.6 kg] 66.6 kg (08/17 0917)  Intake/Output from previous day:  Intake/Output Summary (Last 24 hours) at 02/01/2022 0746 Last data filed at 02/01/2022 0600 Gross per 24 hour  Intake 2041.21 ml  Output 830 ml  Net 1211.21 ml     Intake/Output this shift: No intake/output data recorded.  Labs: Recent Labs    02/01/22 0344  HGB 11.1*   Recent Labs    02/01/22 0344  WBC 7.7  RBC 3.40*  HCT 33.6*  PLT 132*   Recent Labs    02/01/22 0344  NA 139  K 4.1  CL 111  CO2 24  BUN 20  CREATININE 1.16  GLUCOSE 133*  CALCIUM 8.2*   No results for input(s): "LABPT", "INR" in the last 72 hours.  Exam: General - Patient is Alert and Oriented Extremity - Neurologically intact Sensation intact distally Intact pulses distally Dorsiflexion/Plantar flexion intact Dressing - dressing C/D/I, hemovac drain in place with bloody output Motor Function - intact, moving foot and toes well on exam.   Past Medical History:  Diagnosis Date   A-fib (HCC)    REPORTS WAS DUE TO DEHYDRATION 3 YEARS AGO BUT WAS GIVEN FLUIDS IN ED AND MEDS TO SLOW HR AND DROVE HIMSELF HOME  ; HAD ABLATION X2 WITH CARDIO DR. AKBARY AS PRECAUTION  ; has been on eliquis since  but reports he has not taken it for 5 weeks b/c he was told to hold it for the multiple irrigations and debridements for the hip infection. reports his cardiologist is aware he has been holding it;    Arthritis    Atherosclerosis of both carotid arteries    BPH (benign prostatic hyperplasia)    Dysrhythmia    a-fib   Fall    fell on friday 01-16-18;   lost balAnce whIile tryng to get his sock on ;sustained skin tear to left wrist ; no drainage  , scabbed over , and has been covering with bandage    Heart murmur    SINCE HIS 20s   HLD (hyperlipidemia)    Hypertension    Non-rheumatic mitral regurgitation     Assessment/Plan: 1 Day Post-Op Procedure(s) (LRB): Evacuation of right hip hematoma (Right) Principal Problem:   Hematoma of hip, right, sequela Active Problems:   Hematoma of hip, right, subsequent encounter  Estimated body mass index is 23.7 kg/m as calculated from the following:   Height as of this encounter: 5\' 6"  (1.676 m).   Weight as of this encounter: 66.6 kg. Advance diet Up with therapy D/C IV fluids  DVT Prophylaxis -  Eliquis 2.5 BID today, then he is going to discuss with his Cardiologist whether he stays on 2.5 or  5 dosing. (Apparently had been using 2.5 BID for the past week or so per Cardio) Weight bearing as tolerated.  Plan is to go Home after hospital stay. Plan for discharge today after meeting goals with therapy. Follow up in the office in 2 weeks.   Dennie Bible, PA-C Orthopedic Surgery [phone] 02/01/2022, 7:46 AM

## 2022-02-01 NOTE — Anesthesia Postprocedure Evaluation (Signed)
Anesthesia Post Note  Patient: Travis Wolfe  Procedure(s) Performed: Evacuation of right hip hematoma (Right)     Patient location during evaluation: PACU Anesthesia Type: General Level of consciousness: awake and patient cooperative Pain management: pain level controlled Vital Signs Assessment: post-procedure vital signs reviewed and stable Respiratory status: spontaneous breathing, nonlabored ventilation, respiratory function stable and patient connected to nasal cannula oxygen Cardiovascular status: blood pressure returned to baseline and stable Postop Assessment: no apparent nausea or vomiting Anesthetic complications: no   No notable events documented.  Last Vitals:  Vitals:   01/31/22 2217 02/01/22 0559  BP: 133/64 123/61  Pulse: (!) 52 (!) 50  Resp: 18 18  Temp: 36.6 C 36.5 C  SpO2: 100% 99%    Last Pain:  Vitals:   01/31/22 2200  TempSrc:   PainSc: 0-No pain                 Genevie Elman

## 2022-02-07 NOTE — Discharge Summary (Signed)
Patient ID: Travis Wolfe MRN: [id number] DOB/AGE: Feb 21, 1935 87 y.o.  Admit date: 01/31/2022 Discharge date: 02/01/2022  Admission Diagnoses:  Right hip hematoma  Discharge Diagnoses:  Principal Problem:   Hematoma of hip, right, sequela Active Problems:   Hematoma of hip, right, subsequent encounter   Past Medical History:  Diagnosis Date   A-fib (HCC)    REPORTS WAS DUE TO DEHYDRATION 3 YEARS AGO BUT WAS GIVEN FLUIDS IN ED AND MEDS TO SLOW HR AND DROVE HIMSELF HOME  ; HAD ABLATION X2 WITH CARDIO DR. AKBARY AS PRECAUTION  ; has been on eliquis since but reports he has not taken it for 5 weeks b/c he was told to hold it for the multiple irrigations and debridements for the hip infection. reports his cardiologist is aware he has been holding it;    Arthritis    Atherosclerosis of both carotid arteries    BPH (benign prostatic hyperplasia)    Dysrhythmia    a-fib   Fall    fell on friday 01-16-18;   lost balAnce whIile tryng to get his sock on ;sustained skin tear to left wrist ; no drainage  , scabbed over , and has been covering with bandage    Heart murmur    SINCE HIS 20s   HLD (hyperlipidemia)    Hypertension    Non-rheumatic mitral regurgitation     Surgeries: Procedure(s): Evacuation of right hip hematoma on 01/31/2022   Consultants:   Discharged Condition: Improved  Hospital Course: JVION TURGEON is an 87 y.o. male who was admitted 01/31/2022 for operative treatment ofHematoma of hip, right, sequela. Patient has severe unremitting pain that affects sleep, daily activities, and work/hobbies. After pre-op clearance the patient was taken to the operating room on 01/31/2022 and underwent  Procedure(s): Evacuation of right hip hematoma.    Patient was given perioperative antibiotics:  Anti-infectives (From admission, onward)    Start     Dose/Rate Route Frequency Ordered Stop   02/01/22 1000  cefadroxil (DURICEF) capsule 500 mg  Status:  Discontinued        500 mg  Oral 2 times daily 01/31/22 1144 02/01/22 1914   02/01/22 0000  cefadroxil (DURICEF) 500 MG capsule        500 mg Oral 2 times daily 02/01/22 0750 02/08/22 2359   01/31/22 1630  ceFAZolin (ANCEF) IVPB 2g/100 mL premix        2 g 200 mL/hr over 30 Minutes Intravenous Every 6 hours 01/31/22 1325 01/31/22 2140   01/31/22 1325  valACYclovir (VALTREX) tablet 1,000 mg  Status:  Discontinued        1,000 mg Oral Daily PRN 01/31/22 1325 01/31/22 1328   01/31/22 0900  ceFAZolin (ANCEF) IVPB 2g/100 mL premix        2 g 200 mL/hr over 30 Minutes Intravenous On call to O.R. 01/31/22 0848 01/31/22 1105        Patient was given sequential compression devices, early ambulation, and chemoprophylaxis to prevent DVT. Patient worked with PT and was meeting their goals regarding safe ambulation and transfers.  Patient benefited maximally from hospital stay and there were no complications.    Patient scheduled for wound check/hemovac removal in 2-3 days.  Recent vital signs: No data found.   Recent laboratory studies: No results for input(s): "WBC", "HGB", "HCT", "PLT", "NA", "K", "CL", "CO2", "BUN", "CREATININE", "GLUCOSE", "INR", "CALCIUM" in the last 72 hours.  Invalid input(s): "PT", "2"   Discharge Medications:   Allergies as of  02/01/2022       Reactions   Other    Cat dander   Doxycycline Rash   Horse Epithelium Rash        Medication List     STOP taking these medications    docusate sodium 100 MG capsule Commonly known as: COLACE   HYDROcodone-acetaminophen 5-325 MG tablet Commonly known as: NORCO/VICODIN   methocarbamol 500 MG tablet Commonly known as: ROBAXIN       TAKE these medications    acetaminophen 325 MG tablet Commonly known as: TYLENOL Take 3 tablets (975 mg total) by mouth every 6 (six) hours as needed for mild pain (pain score 1-3 or temp > 100.5).   amiodarone 200 MG tablet Commonly known as: PACERONE Take 100 mg by mouth See admin instructions. Take  100 mg by mouth twice daily except for Tuesday and Thursday take it once daily   atorvastatin 40 MG tablet Commonly known as: LIPITOR Take 40 mg by mouth daily.   B-12 PO Take 1 capsule by mouth daily.   Biotin 5 MG Caps Take 5 mg by mouth daily.   Calcium-Magnesium-Zinc Tabs Take 1 tablet by mouth daily.   cefadroxil 500 MG capsule Commonly known as: DURICEF Take 1 capsule (500 mg total) by mouth 2 (two) times daily for 7 days.   Cosopt PF 22.3-6.8 MG/ML Soln ophthalmic solution Generic drug: dorzolamidel-timolol Place 1 drop into both eyes in the morning and at bedtime.   Eliquis 5 MG Tabs tablet Generic drug: apixaban Take 5 mg by mouth 2 (two) times daily.   Eszopiclone 3 MG Tabs Take 3 mg by mouth at bedtime.   furosemide 20 MG tablet Commonly known as: LASIX Take 20 mg by mouth daily as needed for fluid.   ipratropium 0.06 % nasal spray Commonly known as: ATROVENT Place 2 sprays into both nostrils daily as needed for allergies.   Lutein 20 MG Tabs Take 20 mg by mouth daily.   LYCOPENE PO Take 20 mg by mouth daily.   pantoprazole 40 MG tablet Commonly known as: PROTONIX Take 40 mg by mouth daily as needed for heartburn.   polyethylene glycol 17 g packet Commonly known as: MIRALAX / GLYCOLAX Take 17 g by mouth daily as needed for mild constipation.   potassium chloride 10 MEQ tablet Commonly known as: KLOR-CON Take 10-20 mEq by mouth See admin instructions. Take 10 mEq by mouth every other day, alternating with 20 mEq on alternate days.   SLOW RELEASE IRON PO Take 0.5 tablets by mouth daily.   tranexamic acid 650 MG Tabs tablet Commonly known as: LYSTEDA Take 3 tablets (1,950 mg total) by mouth daily for 7 days.   valACYclovir 1000 MG tablet Commonly known as: VALTREX Take 1,000 mg by mouth daily as needed (fever blister).   Vitamin D 50 MCG (2000 UT) tablet Take 2,000 Units by mouth daily.   zinc gluconate 50 MG tablet Take 25 mg by mouth 2  (two) times daily.               Discharge Care Instructions  (From admission, onward)           Start     Ordered   02/01/22 0000  Change dressing       Comments: Maintain surgical dressing until follow up in the clinic. If the edges start to pull up, may reinforce with tape. If the dressing is no longer working, may remove and cover with gauze and tape, but must  keep the area dry and clean.  Call with any questions or concerns.   02/01/22 0750            Diagnostic Studies: No results found.  Disposition: Discharge disposition: 01-Home or Self Care       Discharge Instructions     Call MD / Call 911   Complete by: As directed    If you experience chest pain or shortness of breath, CALL 911 and be transported to the hospital emergency room.  If you develope a fever above 101 F, pus (white drainage) or increased drainage or redness at the wound, or calf pain, call your surgeon's office.   Change dressing   Complete by: As directed    Maintain surgical dressing until follow up in the clinic. If the edges start to pull up, may reinforce with tape. If the dressing is no longer working, may remove and cover with gauze and tape, but must keep the area dry and clean.  Call with any questions or concerns.   Constipation Prevention   Complete by: As directed    Drink plenty of fluids.  Prune juice may be helpful.  You may use a stool softener, such as Colace (over the counter) 100 mg twice a day.  Use MiraLax (over the counter) for constipation as needed.   Diet - low sodium heart healthy   Complete by: As directed    Increase activity slowly as tolerated   Complete by: As directed    Weight bearing as tolerated with assist device (walker, cane, etc) as directed, use it as long as suggested by your surgeon or therapist, typically at least 4-6 weeks.   Post-operative opioid taper instructions:   Complete by: As directed    POST-OPERATIVE OPIOID TAPER INSTRUCTIONS: It is  important to wean off of your opioid medication as soon as possible. If you do not need pain medication after your surgery it is ok to stop day one. Opioids include: Codeine, Hydrocodone(Norco, Vicodin), Oxycodone(Percocet, oxycontin) and hydromorphone amongst others.  Long term and even short term use of opiods can cause: Increased pain response Dependence Constipation Depression Respiratory depression And more.  Withdrawal symptoms can include Flu like symptoms Nausea, vomiting And more Techniques to manage these symptoms Hydrate well Eat regular healthy meals Stay active Use relaxation techniques(deep breathing, meditating, yoga) Do Not substitute Alcohol to help with tapering If you have been on opioids for less than two weeks and do not have pain than it is ok to stop all together.  Plan to wean off of opioids This plan should start within one week post op of your joint replacement. Maintain the same interval or time between taking each dose and first decrease the dose.  Cut the total daily intake of opioids by one tablet each day Next start to increase the time between doses. The last dose that should be eliminated is the evening dose.      TED hose   Complete by: As directed    Use stockings (TED hose) for 2 weeks on both leg(s).  You may remove them at night for sleeping.        Follow-up Information     Durene Romans, MD. Schedule an appointment as soon as possible for a visit in 2 week(s).   Specialty: Orthopedic Surgery Contact information: [address] [place] [address] [phone]                  Signed: Alvan Dame  Poet Hineman 02/07/2022, 10:57 AM

## 2022-04-16 NOTE — Progress Notes (Signed)
Sent message, via epic in basket, requesting order in epic from surgeon     04/16/22 0838  Preop Orders  Has preop orders? No  Name of staff/physician contacted for orders(Indicate phone or IB message) A. Lu Duffel, PA-C

## 2022-04-19 NOTE — Progress Notes (Signed)
Second request for pre op orders, I left voicemail with Sherry. 

## 2022-04-22 NOTE — Patient Instructions (Signed)
SURGICAL WAITING ROOM VISITATION Patients having surgery or a procedure may have no more than 2 support people in the waiting area - these visitors may rotate in the visitor waiting room.   Children under the age of 33 must have an adult with them who is not the patient. If the patient needs to stay at the hospital during part of their recovery, the visitor guidelines for inpatient rooms apply.  PRE-OP VISITATION  Pre-op nurse will coordinate an appropriate time for 1 support person to accompany the patient in pre-op.  This support person may not rotate.  This visitor will be contacted when the time is appropriate for the visitor to come back in the pre-op area.  Please refer to the [facility] website for the visitor guidelines for Inpatients (after your surgery is over and you are in a regular room).  You are not required to quarantine at this time prior to your surgery. However, you must do this: Hand Hygiene often Do NOT share personal items Notify your provider if you are in close contact with someone who has COVID or you develop fever 100.4 or greater, new onset of sneezing, cough, sore throat, shortness of breath or body aches.  If you test positive for Covid or have been in contact with anyone that has tested positive in the last 10 days please notify you surgeon.    Your procedure is scheduled on:   Thursday  May 02, 2022  Report to [facility] Main Entrance: [place] entrance where the [employer] is available.   Report to admitting at: 09:15  AM  +++++Call this number if you have any questions or problems the morning of surgery [phone]  Do not eat food after Midnight the night prior to your surgery/procedure.  After Midnight you may have the following liquids until   08:30 AM DAY OF SURGERY  Clear Liquid Diet Water Black Coffee (sugar ok, NO MILK/CREAM OR CREAMERS)  Tea (sugar ok, NO MILK/CREAM OR CREAMERS) regular and decaf                              Plain Jell-O  with no fruit (NO RED)                                           Fruit ices (not with fruit pulp, NO RED)                                     Popsicles (NO RED)                                                                  Juice: apple, WHITE grape, WHITE cranberry Sports drinks like Gatorade or Powerade (NO RED)                    The day of surgery:  Drink ONE (1) Pre-Surgery Clear Ensure at  08:30  AM the morning of surgery. Drink in one sitting. Do not sip.  This drink was  given to you during your hospital pre-op appointment visit. Nothing else to drink after completing the Pre-Surgery Clear Ensure  No candy, chewing gum or throat lozenges.    FOLLOW  ANY ADDITIONAL PRE OP INSTRUCTIONS YOU RECEIVED FROM YOUR SURGEON'S OFFICE!!!   Oral Hygiene is also important to reduce your risk of infection.        Remember - BRUSH YOUR TEETH THE MORNING OF SURGERY WITH YOUR REGULAR TOOTHPASTE   Take ONLY these medicines the morning of surgery with A SIP OF WATER:none you may use your Cosopt eye drops                  You may not have any metal on your body including  jewelry, and body piercing  Do not wear  lotions, powders, cologne, or deodorant  Men may shave face and neck.  Contacts, Hearing Aids, dentures or bridgework may not be worn into surgery.   Patients discharged on the day of surgery will not be allowed to drive home.  Someone NEEDS to stay with you for the first 24 hours after anesthesia.  Do not bring your home medications to the hospital. The Pharmacy will dispense medications listed on your medication list to you during your admission in the Hospital.  Please read over the following fact sheets you were given: IF YOU HAVE QUESTIONS ABOUT YOUR PRE-OP INSTRUCTIONS, PLEASE CALL [phone]  (KAY)   [facility] - Preparing for Surgery Before surgery, you can play an important role.  Because skin is not sterile, your skin needs to be as free of germs as  possible.  You can reduce the number of germs on your skin by washing with CHG (chlorahexidine gluconate) soap before surgery.  CHG is an antiseptic cleaner which kills germs and bonds with the skin to continue killing germs even after washing. Please DO NOT use if you have an allergy to CHG or antibacterial soaps.  If your skin becomes reddened/irritated stop using the CHG and inform your nurse when you arrive at Short Stay. Do not shave (including legs and underarms) for at least 48 hours prior to the first CHG shower.  You may shave your face/neck.  Please follow these instructions carefully:  1.  Shower with CHG Soap the night before surgery and the  morning of surgery.  2.  If you choose to wash your hair, wash your hair first as usual with your normal  shampoo.  3.  After you shampoo, rinse your hair and body thoroughly to remove the shampoo.                             4.  Use CHG as you would any other liquid soap.  You can apply chg directly to the skin and wash.  Gently with a scrungie or clean washcloth.  5.  Apply the CHG Soap to your body ONLY FROM THE NECK DOWN.   Do not use on face/ open                           Wound or open sores. Avoid contact with eyes, ears mouth and genitals (private parts).                       Wash face,  Genitals (private parts) with your normal soap.  6.  Wash thoroughly, paying special attention to the area where your  surgery  will be performed.  7.  Thoroughly rinse your body with warm water from the neck down.  8.  DO NOT shower/wash with your normal soap after using and rinsing off the CHG Soap.            9.  Pat yourself dry with a clean towel.            10.  Wear clean pajamas.            11.  Place clean sheets on your bed the night of your first shower and do not  sleep with pets.  ON THE DAY OF SURGERY : Do not apply any lotions/deodorants the morning of surgery.  Please wear clean clothes to the hospital/surgery  center.    FAILURE TO FOLLOW THESE INSTRUCTIONS MAY RESULT IN THE CANCELLATION OF YOUR SURGERY  PATIENT SIGNATURE_________________________________  NURSE SIGNATURE__________________________________  ________________________________________________________________________         Rogelia Mire    An incentive spirometer is a tool that can help keep your lungs clear and active. This tool measures how well you are filling your lungs with each breath. Taking long deep breaths may help reverse or decrease the chance of developing breathing (pulmonary) problems (especially infection) following: A long period of time when you are unable to move or be active. BEFORE THE PROCEDURE  If the spirometer includes an indicator to show your best effort, your nurse or respiratory therapist will set it to a desired goal. If possible, sit up straight or lean slightly forward. Try not to slouch. Hold the incentive spirometer in an upright position. INSTRUCTIONS FOR USE  Sit on the edge of your bed if possible, or sit up as far as you can in bed or on a chair. Hold the incentive spirometer in an upright position. Breathe out normally. Place the mouthpiece in your mouth and seal your lips tightly around it. Breathe in slowly and as deeply as possible, raising the piston or the ball toward the top of the column. Hold your breath for 3-5 seconds or for as long as possible. Allow the piston or ball to fall to the bottom of the column. Remove the mouthpiece from your mouth and breathe out normally. Rest for a few seconds and repeat Steps 1 through 7 at least 10 times every 1-2 hours when you are awake. Take your time and take a few normal breaths between deep breaths. The spirometer may include an indicator to show your best effort. Use the indicator as a goal to work toward during each repetition. After each set of 10 deep breaths, practice coughing to be sure your lungs are clear. If you have an  incision (the cut made at the time of surgery), support your incision when coughing by placing a pillow or rolled up towels firmly against it. Once you are able to get out of bed, walk around indoors and cough well. You may stop using the incentive spirometer when instructed by your caregiver.  RISKS AND COMPLICATIONS Take your time so you do not get dizzy or light-headed. If you are in pain, you may need to take or ask for pain medication before doing incentive spirometry. It is harder to take a deep breath if you are having pain. AFTER USE Rest and breathe slowly and easily. It can be helpful to keep track of a log of your progress. Your caregiver can provide you with  a simple table to help with this. If you are using the spirometer at home, follow these instructions: [place] IF:  You are having difficultly using the spirometer. You have trouble using the spirometer as often as instructed. Your pain medication is not giving enough relief while using the spirometer. You develop fever of 100.5 F (38.1 C) or higher.                                                                                                    SEEK IMMEDIATE MEDICAL CARE IF:  You cough up bloody sputum that had not been present before. You develop fever of 102 F (38.9 C) or greater. You develop worsening pain at or near the incision site. MAKE SURE YOU:  Understand these instructions. Will watch your condition. Will get help right away if you are not doing well or get worse. Document Released: 10/14/2006 Document Revised: 08/26/2011 Document Reviewed: 12/15/2006 [facility] Patient Information 2014 [place], [place].

## 2022-04-22 NOTE — Progress Notes (Signed)
The patient was identified using 2 approved identifiers. All issues noted in this document were discussed and addressed, Travis Wolfe   voiced understanding and agreement with all preoperative instructions. The patient was emailed the surgery instructions per his  request to [email or web address].    Patient thought his PST appt was on 04-26-22 so I did a telephone interview; Coming in on Thursday 04-25-22 at 10:15 for lab work.   COVID Vaccine received:  _1  No _2  Yes Date of any COVID positive Test in last 90 days: None  PCP - Gwenyth Allegra, MD Cardiologist - Pat Patrick, MD  Chest x-ray -  EKG -09-11-2021 Epic   Stress Test -  ECHO - 03-16-2021  Epic Cardiac Cath -   PCR screen:_3  Ordered & Completed                      _4   No Order but Needs PROFEND                      _5   N/A for this surgery  Surgery Plan:  _6  Ambulatory                            _7  Outpatient in bed                            _8  Admit  Anesthesia:    _9  General  _10  Spinal                           _11   Choice _12   MAC  Pacemaker / ICD device _13  No _14  Yes        Device order form faxed _15  No    _16   Yes      Faxed to:  Spinal Cord Stimulator:_17  No _18  Yes      (Remind patient to bring remote DOS) Other Implants:   History of Sleep Apnea? _19  No _20  Yes   CPAP used?- _21  No _22  Yes    Does the patient monitor blood sugar? _23  No _24  Yes  _25  N/A   Blood Thinner / Instructions: Eliquis 2.5 bid   ???? 3 day hold, patient to talk to Dr. Phillis Haggis and he knows the importance of following up on this Hold.  Aspirin Instructions: None  Patient states that he also stopped taking his Amiodarone yesterday (04-22-22) Per Dr. Phillis Haggis d/t some vision changes and "eye problems". He is to hold his Amiodarone x 20 days per Phillis Haggis and then "they will re-evaluate his eyes".   ERAS Protocol Ordered: _26  No  _27  Yes PRE-SURGERY _28  ENSURE  _29  G2  _30  No Drink Ordered  Patient is to be NPO after: 0830   Comments: patient is  S/p right total hip revision seroma/ abscess  Activity level: Patient can not climb a flight of stairs without difficulty; _31  No CP  _32  No SOB, but would have _Leg pain. Patient can / can perform ADLs without assistance.   Anesthesia review: A.fib (ablations 2019, 2023), HTN, Bradycardia, Travis on ECHO.  Patient denies shortness of breath, fever, cough and chest pain at PAT appointment.  Patient verbalized understanding and agreement to the Pre-Surgical Instructions that were given to them at this PAT appointment. Patient was also educated of the need to review these PAT instructions again prior to his/her surgery.I reviewed the appropriate  phone numbers to call if they have any and questions or concerns.

## 2022-04-23 ENCOUNTER — Encounter (HOSPITAL_COMMUNITY)
Admission: RE | Admit: 2022-04-23 | Discharge: 2022-04-23 | Disposition: A | Discharge status: Home / Self Care | Payer: Medicare PPO | Source: Ambulatory Visit | Attending: Orthopedic Surgery | Admitting: Orthopedic Surgery

## 2022-04-23 ENCOUNTER — Encounter (HOSPITAL_COMMUNITY): Payer: Self-pay

## 2022-04-25 ENCOUNTER — Encounter (HOSPITAL_COMMUNITY): Admission: RE | Admit: 2022-04-25 | Payer: Medicare PPO | Source: Ambulatory Visit

## 2022-04-29 ENCOUNTER — Encounter (HOSPITAL_COMMUNITY)
Admission: RE | Admit: 2022-04-29 | Discharge: 2022-04-29 | Disposition: A | Discharge status: Home / Self Care | Payer: Medicare PPO | Source: Ambulatory Visit | Attending: Orthopedic Surgery | Admitting: Orthopedic Surgery

## 2022-04-29 DIAGNOSIS — I272 Pulmonary hypertension, unspecified: Secondary | ICD-10-CM | POA: Insufficient documentation

## 2022-04-29 DIAGNOSIS — I340 Nonrheumatic mitral (valve) insufficiency: Secondary | ICD-10-CM | POA: Diagnosis not present

## 2022-04-29 DIAGNOSIS — I10 Essential (primary) hypertension: Secondary | ICD-10-CM | POA: Diagnosis not present

## 2022-04-29 DIAGNOSIS — Z01812 Encounter for preprocedural laboratory examination: Secondary | ICD-10-CM | POA: Diagnosis present

## 2022-04-29 DIAGNOSIS — I4819 Other persistent atrial fibrillation: Secondary | ICD-10-CM | POA: Insufficient documentation

## 2022-04-29 DIAGNOSIS — S7001XS Contusion of right hip, sequela: Secondary | ICD-10-CM

## 2022-04-30 NOTE — Anesthesia Preprocedure Evaluation (Addendum)
Anesthesia Evaluation  Patient identified by MRN, date of birth, ID band Patient awake    Reviewed: Allergy & Precautions, NPO status , Patient's Chart, lab work & pertinent test results  Airway Mallampati: I  TM Distance: >3 FB Neck ROM: Full    Dental  (+) Teeth Intact, Dental Advisory Given   Pulmonary neg pulmonary ROS   breath sounds clear to auscultation       Cardiovascular hypertension, + dysrhythmias Atrial Fibrillation + Valvular Problems/Murmurs MR  Rhythm:Regular Rate:Normal     Neuro/Psych negative neurological ROS  negative psych ROS   GI/Hepatic negative GI ROS, Neg liver ROS,,,  Endo/Other  negative endocrine ROS    Renal/GU negative Renal ROS     Musculoskeletal  (+) Arthritis ,    Abdominal   Peds  Hematology negative hematology ROS (+)   Anesthesia Other Findings   Reproductive/Obstetrics                             Anesthesia Physical Anesthesia Plan  ASA: 3  Anesthesia Plan: General   Post-op Pain Management:    Induction: Intravenous  PONV Risk Score and Plan: 2 and Ondansetron and Propofol infusion  Airway Management Planned: Oral ETT  Additional Equipment: None  Intra-op Plan:   Post-operative Plan: Extubation in OR  Informed Consent: I have reviewed the patients History and Physical, chart, labs and discussed the procedure including the risks, benefits and alternatives for the proposed anesthesia with the patient or authorized representative who has indicated his/her understanding and acceptance.     Dental advisory given  Plan Discussed with: CRNA  Anesthesia Plan Comments: (See APP note by Joslyn Hy, FNP  Last apixaban dose:  Lab Results      Component                Value               Date                      WBC                      7.7                 02/01/2022                HGB                      11.1 (L)            02/01/2022                 HCT                      33.6 (L)            02/01/2022                MCV                      98.8                02/01/2022                PLT                      132 (L)  02/01/2022           )       Anesthesia Quick Evaluation

## 2022-04-30 NOTE — Progress Notes (Signed)
Anesthesia Chart Review:   Case: [id number] Date/Time: 05/02/22 1115   Procedure: Evacuation of right hip Seroma (Right)   Anesthesia type: Spinal   Pre-op diagnosis: Status post right total hip revision seroma   Location: [facility]OM 10 / [facility]   Surgeons: Durene Romans, MD       DISCUSSION: Pt is 87 years old with hx persistent atrial fibrillation (s/p ablation 2016, 2019 and cardioversion 2023, has been maintaining SR since cardioversion), pulmonary HTN, mitral regurgitation (mild-mod on 04/10/22 echo), moderate bilateral carotid artery disease (2023), HTN  Pt has not received eliquis instructions; he will contact cardiologist for guidance. I left voicemail about this for [facility] in Dr. Nilsa Nutting office. Amiodarone on hold due to vision changes.   VS: BP (!) 150/63 (BP Location: Right Arm)   Pulse (!) 51   Temp 36.6 C (Oral)   Resp 16   Ht 5' 6.5" (1.689 m)   Wt 69.9 kg   SpO2 98%   BMI 24.48 kg/m   PROVIDERS: - PCP is Elijio Miles., MD (notes in care everywhere)  - Cardiologist is Sandy Salaam, MD. Last office visit 12/27/21 with Beatriz Chancellor, NP. (notes in care everywhere)   LABS: Labs reviewed: Acceptable for surgery. - CBC w/diff 04/16/22: H/H 13.8/41.2. Platelets 137 - BMP 04/16/22: Glucose 103   IMAGES: Ct head 04/16/22 (care everywhere):  1. No evidence of acute intracranial abnormality.  2. Remote right parietal infarct.   AP chest with R ribs 01/07/22 (care everywhere):  1. No acute displaced rib fractures.  2. Chronic interstitial scarring.  No acute airspace disease.  3. Stable left nephrolithiasis.    EKG 09/12/21 (found in media tab in Epic in correspondence 02/05/22): Sinus bradycardia. Incomplete LBBB, otherwise normal.   CV: Echo 04/10/22 (care everywhere):  - Left ventricular systolic function is normal.  - LV ejection fraction = 65-70%.  - No segmental wall motion abnormalities seen in the left ventricle  - The right ventricle is normal in size and  function.  - The left atrium is severely dilated.  - There is mild to moderate mitral regurgitation.  - There is mild to moderate tricuspid regurgitation.  - Mild pulmonary hypertension.  - Estimated right ventricular systolic pressure is 45-50 mmHg.  - Mild pulmonic valvular regurgitation.  - Probably no significant change in comparison with the prior study noted   Carotid duplex 08/06/21:  - Color flow duplex imaging exam of the bilateral cervical carotid systems reveals atherosclerosis with mildly elevated velocities of the bilateral internal carotid arteries consistent with 40-59% diameter reduction.  - Antegrade flow bilateral vertebral arteries.  - Bilateral subclavian arterial signals are normal consistent with no significant arterial occlusion.    Past Medical History:  Diagnosis Date   A-fib (HCC)    REPORTS WAS DUE TO DEHYDRATION 3 YEARS AGO BUT WAS GIVEN FLUIDS IN ED AND MEDS TO SLOW HR AND DROVE HIMSELF HOME  ; HAD ABLATION X2 WITH CARDIO DR. AKBARY AS PRECAUTION  ; has been on eliquis since but reports he has not taken it for 5 weeks b/c he was told to hold it for the multiple irrigations and debridements for the hip infection. reports his cardiologist is aware he has been holding it;    Arthritis    Atherosclerosis of both carotid arteries    BPH (benign prostatic hyperplasia)    Dysrhythmia    a-fib   Fall    fell on friday 01-16-18;   lost balAnce whIile tryng to  get his sock on ;sustained skin tear to left wrist ; no drainage  , scabbed over , and has been covering with bandage    Heart murmur    SINCE HIS 20s   HLD (hyperlipidemia)    Hypertension    Non-rheumatic mitral regurgitation     Past Surgical History:  Procedure Laterality Date   BACK SURGERY  2007   L1-L5 LUMBAR WITH HARDWARE PLACED   CARDIOVERSION     CATARACT EXTRACTION, BILATERAL  2015   CRYOABLATION  2023   CARDIAC ABLATION and 2019   DACROCYSTORHINOSTOMY  07/26/2011   HEMATOMA EVACUATION Right  01/31/2022   Procedure: Evacuation of right hip hematoma;  Surgeon: Durene Romans, MD;  [facility];  Service: Orthopedics;  Laterality: Right;  90 mins   HERNIA REPAIR Bilateral    hx of echocardiogram      INCISION AND DRAINAGE HIP Right 01/26/2018   Procedure: Right hip irrigation and debridement, excisional and non excisional debridement, head liner exchange, posterior approach;  Surgeon: Durene Romans, MD;  [facility];  Service: Orthopedics;  Laterality: Right;  90 mins Would like to start earlier around 2:00pm if time opens   INCISION AND DRAINAGE HIP Right 11/26/2018   Procedure: IRRIGATION AND DEBRIDEMENT HIP;  Surgeon: Durene Romans, MD;  [facility];  Service: Orthopedics;  Laterality: Right;  90 mins   INCISION AND DRAINAGE HIP Right 05/17/2021   Procedure: IRRIGATION AND DEBRIDEMENT RIGHT HIP, EVACUATION OF PSEUDOTUMOR;  Surgeon: Durene Romans, MD;  Location[facility];  Service: Orthopedics;  Laterality: Right;   INGUINAL HERNIA REPAIR Bilateral    with mesh    IR US GUIDE BX ASP/DRAIN  12/17/2017   IRRIGATION AND ASPIRATION RIGHT HIP   12/10/2017   [facility]  x2, [facility] x1    KIDNEY STONE SURGERY     lithotripsy    KYPHOPLASTY     T7   LATERAL FUSION LUMBAR SPINE     L1-L5   PULMONARY VEIN ISOLATION AND LEFT ATRIAL ROOFLINE ABLATION   10/15/2017   DR Rudolpho Sevin    ROTATOR CUFF REPAIR Left    SHOULDER DEBRIDEMENT Right    SHOULDER OPEN ROTATOR CUFF REPAIR Left    SHOULDER SURGERY  1988   arthroscopy     TONSILLECTOMY     TOTAL HIP ARTHROPLASTY Right 2003   TOTAL HIP ARTHROPLASTY  2010   TOTAL HIP REVISION      05-2017, 10-2016    MEDICATIONS:  acetaminophen (TYLENOL) 325 MG tablet   amiodarone (PACERONE) 200 MG tablet   apixaban (ELIQUIS) 5 MG TABS tablet   atorvastatin (LIPITOR) 40 MG tablet   Biotin 5 MG CAPS   Cholecalciferol (VITAMIN D) 50 MCG (2000 UT) tablet   COSOPT PF 22.3-6.8 MG/ML SOLN ophthalmic solution   Eszopiclone 3 MG TABS   Ferrous  Sulfate (SLOW RELEASE IRON PO)   furosemide (LASIX) 20 MG tablet   Lutein 20 MG TABS   LYCOPENE PO   Multiple Minerals (CALCIUM-MAGNESIUM-ZINC) TABS   polyethylene glycol (MIRALAX / GLYCOLAX) 17 g packet   potassium chloride (KLOR-CON) 10 MEQ tablet   tadalafil (CIALIS) 5 MG tablet   valACYclovir (VALTREX) 1000 MG tablet   zinc gluconate 50 MG tablet   No current facility-administered medications for this encounter.    If no changes, I anticipate pt can proceed with surgery as scheduled.   Rica Mast, PhD, FNP-BC [facility]/Anesthesiology Phone: [phone] 04/30/2022 3:06 PM

## 2022-05-02 ENCOUNTER — Encounter (HOSPITAL_COMMUNITY): Payer: Self-pay | Admitting: Orthopedic Surgery

## 2022-05-02 ENCOUNTER — Other Ambulatory Visit: Payer: Self-pay

## 2022-05-02 ENCOUNTER — Ambulatory Visit (HOSPITAL_BASED_OUTPATIENT_CLINIC_OR_DEPARTMENT_OTHER): Payer: Medicare PPO | Admitting: Certified Registered"

## 2022-05-02 ENCOUNTER — Observation Stay (HOSPITAL_COMMUNITY)
Admission: RE | Admit: 2022-05-02 | Discharge: 2022-05-03 | Disposition: A | Discharge status: Home / Self Care | Payer: Medicare PPO | Attending: Orthopedic Surgery | Admitting: Orthopedic Surgery

## 2022-05-02 ENCOUNTER — Ambulatory Visit (HOSPITAL_COMMUNITY): Payer: Medicare PPO | Admitting: Emergency Medicine

## 2022-05-02 ENCOUNTER — Encounter (HOSPITAL_COMMUNITY)
Admission: RE | Disposition: A | Discharge status: Home / Self Care | Payer: Self-pay | Source: Home / Self Care | Attending: Orthopedic Surgery

## 2022-05-02 DIAGNOSIS — I4891 Unspecified atrial fibrillation: Secondary | ICD-10-CM | POA: Insufficient documentation

## 2022-05-02 DIAGNOSIS — I10 Essential (primary) hypertension: Secondary | ICD-10-CM | POA: Insufficient documentation

## 2022-05-02 DIAGNOSIS — M96842 Postprocedural seroma of a musculoskeletal structure following a musculoskeletal system procedure: Secondary | ICD-10-CM | POA: Diagnosis not present

## 2022-05-02 DIAGNOSIS — L7634 Postprocedural seroma of skin and subcutaneous tissue following other procedure: Secondary | ICD-10-CM

## 2022-05-02 DIAGNOSIS — Z96641 Presence of right artificial hip joint: Secondary | ICD-10-CM | POA: Diagnosis not present

## 2022-05-02 DIAGNOSIS — M1990 Unspecified osteoarthritis, unspecified site: Secondary | ICD-10-CM

## 2022-05-02 DIAGNOSIS — S7001XS Contusion of right hip, sequela: Secondary | ICD-10-CM

## 2022-05-02 HISTORY — PX: HEMATOMA EVACUATION: SHX5118

## 2022-05-02 LAB — TYPE AND SCREEN
ABO/RH(D): A POS
Antibody Screen: NEGATIVE

## 2022-05-02 SURGERY — EVACUATION HEMATOMA
Anesthesia: General | Laterality: Right

## 2022-05-02 MED ORDER — APIXABAN 2.5 MG PO TABS
2.5000 mg | ORAL_TABLET | Freq: Two times a day (BID) | ORAL | Status: DC
  Administered 2022-05-03: 2.5 mg via ORAL
  Filled 2022-05-02: qty 1

## 2022-05-02 MED ORDER — DORZOLAMIDE HCL-TIMOLOL MAL PF 2-0.5 % OP SOLN: 1.0000 [drp] | Freq: Two times a day (BID) | OPHTHALMIC | Status: DC

## 2022-05-02 MED ORDER — DOCUSATE SODIUM 100 MG PO CAPS
100.0000 mg | ORAL_CAPSULE | Freq: Two times a day (BID) | ORAL | Status: DC
  Administered 2022-05-02 – 2022-05-03 (×2): 100 mg via ORAL
  Filled 2022-05-02 (×3): qty 1

## 2022-05-02 MED ORDER — TRANEXAMIC ACID-NACL 1000-0.7 MG/100ML-% IV SOLN
1000.0000 mg | INTRAVENOUS | Status: AC
  Administered 2022-05-02: 1000 mg via INTRAVENOUS
  Filled 2022-05-02: qty 100

## 2022-05-02 MED ORDER — FENTANYL CITRATE (PF) 100 MCG/2ML IJ SOLN
INTRAMUSCULAR | Status: AC
  Filled 2022-05-02: qty 2

## 2022-05-02 MED ORDER — POTASSIUM CHLORIDE CRYS ER 10 MEQ PO TBCR
10.0000 meq | EXTENDED_RELEASE_TABLET | ORAL | Status: DC
  Administered 2022-05-02: 10 meq via ORAL
  Filled 2022-05-02: qty 1

## 2022-05-02 MED ORDER — POTASSIUM CHLORIDE ER 10 MEQ PO TBCR: 10.0000 meq | EXTENDED_RELEASE_TABLET | ORAL | Status: DC

## 2022-05-02 MED ORDER — CEFAZOLIN SODIUM-DEXTROSE 2-4 GM/100ML-% IV SOLN
2.0000 g | Freq: Four times a day (QID) | INTRAVENOUS | Status: AC
  Administered 2022-05-02 (×2): 2 g via INTRAVENOUS
  Filled 2022-05-02 (×2): qty 100

## 2022-05-02 MED ORDER — METOCLOPRAMIDE HCL 5 MG/ML IJ SOLN: 5.0000 mg | Freq: Three times a day (TID) | INTRAMUSCULAR | Status: DC | PRN

## 2022-05-02 MED ORDER — ONDANSETRON HCL 4 MG/2ML IJ SOLN: 4.0000 mg | Freq: Four times a day (QID) | INTRAMUSCULAR | Status: DC | PRN

## 2022-05-02 MED ORDER — PRONTOSAN WOUND IRRIGATION OPTIME
TOPICAL | Status: DC | PRN
  Administered 2022-05-02: 1

## 2022-05-02 MED ORDER — CHLORHEXIDINE GLUCONATE 0.12 % MT SOLN
15.0000 mL | Freq: Once | OROMUCOSAL | Status: AC
  Administered 2022-05-02: 15 mL via OROMUCOSAL

## 2022-05-02 MED ORDER — POTASSIUM CHLORIDE CRYS ER 20 MEQ PO TBCR
20.0000 meq | EXTENDED_RELEASE_TABLET | ORAL | Status: DC
  Administered 2022-05-03: 20 meq via ORAL
  Filled 2022-05-02: qty 1

## 2022-05-02 MED ORDER — ORAL CARE MOUTH RINSE: 15.0000 mL | OROMUCOSAL | Status: DC | PRN

## 2022-05-02 MED ORDER — DORZOLAMIDE HCL-TIMOLOL MAL 2-0.5 % OP SOLN
1.0000 [drp] | Freq: Two times a day (BID) | OPHTHALMIC | Status: DC
  Administered 2022-05-02 – 2022-05-03 (×2): 1 [drp] via OPHTHALMIC
  Filled 2022-05-02: qty 10

## 2022-05-02 MED ORDER — MORPHINE SULFATE (PF) 2 MG/ML IV SOLN: 0.5000 mg | INTRAVENOUS | Status: DC | PRN

## 2022-05-02 MED ORDER — HYDROMORPHONE HCL 1 MG/ML IJ SOLN: 0.2500 mg | INTRAMUSCULAR | Status: DC | PRN

## 2022-05-02 MED ORDER — AMIODARONE HCL 100 MG PO TABS: 100.0000 mg | ORAL_TABLET | ORAL | Status: DC

## 2022-05-02 MED ORDER — HYDROCODONE-ACETAMINOPHEN 7.5-325 MG PO TABS: 1.0000 | ORAL_TABLET | ORAL | Status: DC | PRN

## 2022-05-02 MED ORDER — METOCLOPRAMIDE HCL 5 MG PO TABS: 5.0000 mg | ORAL_TABLET | Freq: Three times a day (TID) | ORAL | Status: DC | PRN

## 2022-05-02 MED ORDER — POVIDONE-IODINE 10 % EX SWAB: 2.0000 | Freq: Once | CUTANEOUS | Status: DC

## 2022-05-02 MED ORDER — DEXAMETHASONE SODIUM PHOSPHATE 10 MG/ML IJ SOLN
8.0000 mg | Freq: Once | INTRAMUSCULAR | Status: AC
  Administered 2022-05-02: 8 mg via INTRAVENOUS

## 2022-05-02 MED ORDER — POLYETHYLENE GLYCOL 3350 17 G PO PACK
17.0000 g | PACK | Freq: Two times a day (BID) | ORAL | Status: DC
  Administered 2022-05-02 – 2022-05-03 (×2): 17 g via ORAL
  Filled 2022-05-02 (×2): qty 1

## 2022-05-02 MED ORDER — LIDOCAINE 2% (20 MG/ML) 5 ML SYRINGE
INTRAMUSCULAR | Status: DC | PRN
  Administered 2022-05-02: 40 mg via INTRAVENOUS

## 2022-05-02 MED ORDER — 0.9 % SODIUM CHLORIDE (POUR BTL) OPTIME
TOPICAL | Status: DC | PRN
  Administered 2022-05-02 (×2): 1000 mL

## 2022-05-02 MED ORDER — PROPOFOL 10 MG/ML IV BOLUS
INTRAVENOUS | Status: DC | PRN
  Administered 2022-05-02: 100 mg via INTRAVENOUS

## 2022-05-02 MED ORDER — PHENOL 1.4 % MT LIQD: 1.0000 | OROMUCOSAL | Status: DC | PRN

## 2022-05-02 MED ORDER — ORAL CARE MOUTH RINSE: 15.0000 mL | Freq: Once | OROMUCOSAL | Status: AC

## 2022-05-02 MED ORDER — AMISULPRIDE (ANTIEMETIC) 5 MG/2ML IV SOLN: 10.0000 mg | Freq: Once | INTRAVENOUS | Status: DC | PRN

## 2022-05-02 MED ORDER — BISACODYL 10 MG RE SUPP: 10.0000 mg | Freq: Every day | RECTAL | Status: DC | PRN

## 2022-05-02 MED ORDER — ZOLPIDEM TARTRATE 5 MG PO TABS
5.0000 mg | ORAL_TABLET | Freq: Once | ORAL | Status: AC
  Administered 2022-05-02: 5 mg via ORAL
  Filled 2022-05-02: qty 1

## 2022-05-02 MED ORDER — ACETAMINOPHEN 160 MG/5ML PO SOLN: 325.0000 mg | Freq: Once | ORAL | Status: DC | PRN

## 2022-05-02 MED ORDER — EPHEDRINE 5 MG/ML INJ
INTRAVENOUS | Status: AC
  Filled 2022-05-02: qty 5

## 2022-05-02 MED ORDER — LIDOCAINE HCL (PF) 2 % IJ SOLN
INTRAMUSCULAR | Status: AC
  Filled 2022-05-02: qty 5

## 2022-05-02 MED ORDER — ACETAMINOPHEN 325 MG PO TABS: 325.0000 mg | ORAL_TABLET | Freq: Once | ORAL | Status: DC | PRN

## 2022-05-02 MED ORDER — MENTHOL 3 MG MT LOZG: 1.0000 | LOZENGE | OROMUCOSAL | Status: DC | PRN

## 2022-05-02 MED ORDER — ROCURONIUM BROMIDE 10 MG/ML (PF) SYRINGE
PREFILLED_SYRINGE | INTRAVENOUS | Status: DC | PRN
  Administered 2022-05-02: 50 mg via INTRAVENOUS

## 2022-05-02 MED ORDER — ONDANSETRON HCL 4 MG/2ML IJ SOLN
INTRAMUSCULAR | Status: DC | PRN
  Administered 2022-05-02: 4 mg via INTRAVENOUS

## 2022-05-02 MED ORDER — CEFAZOLIN SODIUM-DEXTROSE 2-4 GM/100ML-% IV SOLN
2.0000 g | INTRAVENOUS | Status: AC
  Administered 2022-05-02: 2 g via INTRAVENOUS
  Filled 2022-05-02: qty 100

## 2022-05-02 MED ORDER — DEXAMETHASONE SODIUM PHOSPHATE 10 MG/ML IJ SOLN
10.0000 mg | Freq: Once | INTRAMUSCULAR | Status: AC
  Administered 2022-05-03: 10 mg via INTRAVENOUS
  Filled 2022-05-02: qty 1

## 2022-05-02 MED ORDER — FUROSEMIDE 20 MG PO TABS: 20.0000 mg | ORAL_TABLET | Freq: Every day | ORAL | Status: DC | PRN

## 2022-05-02 MED ORDER — LACTATED RINGERS IV SOLN: INTRAVENOUS | Status: DC

## 2022-05-02 MED ORDER — DIPHENHYDRAMINE HCL 12.5 MG/5ML PO ELIX: 12.5000 mg | ORAL_SOLUTION | ORAL | Status: DC | PRN

## 2022-05-02 MED ORDER — SUGAMMADEX SODIUM 200 MG/2ML IV SOLN
INTRAVENOUS | Status: DC | PRN
  Administered 2022-05-02: 150 mg via INTRAVENOUS

## 2022-05-02 MED ORDER — PROPOFOL 10 MG/ML IV BOLUS
INTRAVENOUS | Status: AC
  Filled 2022-05-02: qty 20

## 2022-05-02 MED ORDER — METHOCARBAMOL 500 MG IVPB - SIMPLE MED: 500.0000 mg | Freq: Four times a day (QID) | INTRAVENOUS | Status: DC | PRN

## 2022-05-02 MED ORDER — EPHEDRINE SULFATE-NACL 50-0.9 MG/10ML-% IV SOSY
PREFILLED_SYRINGE | INTRAVENOUS | Status: DC | PRN
  Administered 2022-05-02 (×2): 10 mg via INTRAVENOUS

## 2022-05-02 MED ORDER — TRANEXAMIC ACID-NACL 1000-0.7 MG/100ML-% IV SOLN
1000.0000 mg | Freq: Once | INTRAVENOUS | Status: AC
  Administered 2022-05-02: 1000 mg via INTRAVENOUS
  Filled 2022-05-02: qty 100

## 2022-05-02 MED ORDER — ONDANSETRON HCL 4 MG PO TABS: 4.0000 mg | ORAL_TABLET | Freq: Four times a day (QID) | ORAL | Status: DC | PRN

## 2022-05-02 MED ORDER — ATORVASTATIN CALCIUM 40 MG PO TABS
40.0000 mg | ORAL_TABLET | Freq: Every day | ORAL | Status: DC
  Administered 2022-05-02 – 2022-05-03 (×2): 40 mg via ORAL
  Filled 2022-05-02 (×2): qty 1

## 2022-05-02 MED ORDER — ACETAMINOPHEN 325 MG PO TABS: 325.0000 mg | ORAL_TABLET | Freq: Four times a day (QID) | ORAL | Status: DC | PRN

## 2022-05-02 MED ORDER — SODIUM CHLORIDE 0.9 % IV SOLN: INTRAVENOUS | Status: DC

## 2022-05-02 MED ORDER — CEFADROXIL 500 MG PO CAPS
500.0000 mg | ORAL_CAPSULE | Freq: Two times a day (BID) | ORAL | Status: DC
  Administered 2022-05-03: 500 mg via ORAL
  Filled 2022-05-02: qty 1

## 2022-05-02 MED ORDER — METHOCARBAMOL 500 MG PO TABS
500.0000 mg | ORAL_TABLET | Freq: Four times a day (QID) | ORAL | Status: DC | PRN
  Administered 2022-05-02 – 2022-05-03 (×2): 500 mg via ORAL
  Filled 2022-05-02 (×2): qty 1

## 2022-05-02 MED ORDER — HYDROCODONE-ACETAMINOPHEN 5-325 MG PO TABS
1.0000 | ORAL_TABLET | ORAL | Status: DC | PRN
  Administered 2022-05-02: 2 via ORAL
  Administered 2022-05-02: 1 via ORAL
  Administered 2022-05-03: 2 via ORAL
  Filled 2022-05-02: qty 1
  Filled 2022-05-02 (×2): qty 2

## 2022-05-02 MED ORDER — ACETAMINOPHEN 10 MG/ML IV SOLN: 1000.0000 mg | Freq: Once | INTRAVENOUS | Status: DC | PRN

## 2022-05-02 MED ORDER — FENTANYL CITRATE (PF) 100 MCG/2ML IJ SOLN
INTRAMUSCULAR | Status: DC | PRN
  Administered 2022-05-02: 50 ug via INTRAVENOUS

## 2022-05-02 MED ORDER — ROCURONIUM BROMIDE 10 MG/ML (PF) SYRINGE
PREFILLED_SYRINGE | INTRAVENOUS | Status: AC
  Filled 2022-05-02: qty 10

## 2022-05-02 SURGICAL SUPPLY — 37 items
BAG COUNTER SPONGE SURGICOUNT (BAG) IMPLANT
BAG ZIPLOCK 12X15 (MISCELLANEOUS) ×1 IMPLANT
BNDG COHESIVE 6X5 TAN ST LF (GAUZE/BANDAGES/DRESSINGS) IMPLANT
COVER SURGICAL LIGHT HANDLE (MISCELLANEOUS) ×1 IMPLANT
DRAIN PENROSE 0.5X18 (DRAIN) IMPLANT
DRAPE INCISE IOBAN 66X45 STRL (DRAPES) IMPLANT
DRAPE INCISE IOBAN 85X60 (DRAPES) IMPLANT
DRAPE ORTHO SPLIT 77X108 STRL (DRAPES) ×2
DRAPE SURG ORHT 6 SPLT 77X108 (DRAPES) IMPLANT
DRAPE TOP 10253 STERILE (DRAPES) IMPLANT
DRSG VAC ATS SM SENSATRAC (GAUZE/BANDAGES/DRESSINGS) IMPLANT
DURAPREP 26ML APPLICATOR (WOUND CARE) ×1 IMPLANT
ELECT REM PT RETURN 15FT ADLT (MISCELLANEOUS) ×1 IMPLANT
GAUZE PAD ABD 8X10 STRL (GAUZE/BANDAGES/DRESSINGS) IMPLANT
GAUZE SPONGE 4X4 12PLY STRL (GAUZE/BANDAGES/DRESSINGS) IMPLANT
GLOVE BIOGEL PI IND STRL 6.5 (GLOVE) ×1 IMPLANT
GLOVE BIOGEL PI IND STRL 7.5 (GLOVE) ×3 IMPLANT
GLOVE ECLIPSE 6.0 STRL STRAW (GLOVE) ×1 IMPLANT
GLOVE INDICATOR 6.5 STRL GRN (GLOVE) ×1 IMPLANT
GLOVE PI ORTHO PRO STRL 7.5 (GLOVE) ×1 IMPLANT
GOWN STRL REUS W/ TWL LRG LVL3 (GOWN DISPOSABLE) ×2 IMPLANT
GOWN STRL REUS W/TWL LRG LVL3 (GOWN DISPOSABLE) ×2
HANDPIECE INTERPULSE COAX TIP (DISPOSABLE) ×1
IRRIGATOR SUCT 8 DISP DVNC XI (IRRIGATION / IRRIGATOR) IMPLANT
IRRIGATOR SUCTION 8MM XI DISP (IRRIGATION / IRRIGATOR) ×1
KIT BASIN OR (CUSTOM PROCEDURE TRAY) ×1 IMPLANT
KIT TURNOVER KIT A (KITS) IMPLANT
MANIFOLD NEPTUNE II (INSTRUMENTS) ×1 IMPLANT
PACK ORTHO EXTREMITY (CUSTOM PROCEDURE TRAY) ×1 IMPLANT
PROTECTOR NERVE ULNAR (MISCELLANEOUS) ×1 IMPLANT
SET HNDPC FAN SPRY TIP SCT (DISPOSABLE) ×1 IMPLANT
SUT VIC AB 1 CT1 36 (SUTURE) IMPLANT
SUT VIC AB 2-0 CT1 27 (SUTURE) ×2
SUT VIC AB 2-0 CT1 TAPERPNT 27 (SUTURE) IMPLANT
SYR CONTROL 10ML LL (SYRINGE) ×1 IMPLANT
TOWEL OR 17X26 10 PK STRL BLUE (TOWEL DISPOSABLE) ×2 IMPLANT
TUBE SUCTION HIGH CAP CLEAR NV (SUCTIONS) IMPLANT

## 2022-05-02 NOTE — Anesthesia Postprocedure Evaluation (Signed)
Anesthesia Post Note  Patient: EKANSH SHERK  Procedure(s) Performed: Evacuation of right hip Seroma (Right)     Patient location during evaluation: PACU Anesthesia Type: General Level of consciousness: awake and alert Pain management: pain level controlled Vital Signs Assessment: post-procedure vital signs reviewed and stable Respiratory status: spontaneous breathing, nonlabored ventilation, respiratory function stable and patient connected to nasal cannula oxygen Cardiovascular status: blood pressure returned to baseline and stable Postop Assessment: no apparent nausea or vomiting Anesthetic complications: no   No notable events documented.  Last Vitals:  Vitals:   05/02/22 1145 05/02/22 1224  BP: 136/66 (!) 140/63  Pulse: (!) 49 (!) 48  Resp: 20 15  Temp:    SpO2: 100% 98%    Last Pain:  Vitals:   05/02/22 1145  TempSrc:   PainSc: 0-No pain                 Shelton Silvas

## 2022-05-02 NOTE — Discharge Instructions (Signed)
INSTRUCTIONS AFTER SURGERY  Remove items at home which could result in a fall. This includes throw rugs or furniture in walking pathways ICE to the affected joint every three hours while awake for 30 minutes at a time, for at least the first 3-5 days, and then as needed for pain and swelling.  Continue to use ice for pain and swelling. You may notice swelling that will progress down to the foot and ankle.  This is normal after surgery.  Elevate your leg when you are not up walking on it.   Continue to use the breathing machine you got in the hospital (incentive spirometer) which will help keep your temperature down.  It is common for your temperature to cycle up and down following surgery, especially at night when you are not up moving around and exerting yourself.  The breathing machine keeps your lungs expanded and your temperature down.   DIET:  As you were doing prior to hospitalization, we recommend a well-balanced diet.  DRESSING / WOUND CARE / SHOWERING  Dressing changes per our office. Keep wound vac clean and dry. Call the office if the vac stops working  ACTIVITY  Increase activity slowly as tolerated, but follow the weight bearing instructions below.   No driving for 6 weeks or until further direction given by your physician.  You cannot drive while taking narcotics.  No lifting or carrying greater than 10 lbs. until further directed by your surgeon. Avoid periods of inactivity such as sitting longer than an hour when not asleep. This helps prevent blood clots.  You may return to work once you are authorized by your doctor.     WEIGHT BEARING   Weight bearing as tolerated with assist device (walker, cane, etc) as directed, use it as long as suggested by your surgeon or therapist, typically at least 4-6 weeks.  CONSTIPATION  Constipation is defined medically as fewer than three stools per week and severe constipation as less than one stool per week.  Even if you have a  regular bowel pattern at home, your normal regimen is likely to be disrupted due to multiple reasons following surgery.  Combination of anesthesia, postoperative narcotics, change in appetite and fluid intake all can affect your bowels.   YOU MUST use at least one of the following options; they are listed in order of increasing strength to get the job done.  They are all available over the counter, and you may need to use some, POSSIBLY even all of these options:    Drink plenty of fluids (prune juice may be helpful) and high fiber foods Colace 100 mg by mouth twice a day  Senokot for constipation as directed and as needed Dulcolax (bisacodyl), take with full glass of water  Miralax (polyethylene glycol) once or twice a day as needed.  If you have tried all these things and are unable to have a bowel movement in the first 3-4 days after surgery call either your surgeon or your primary doctor.    If you experience loose stools or diarrhea, hold the medications until you stool forms back up.  If your symptoms do not get better within 1 week or if they get worse, check with your doctor.  If you experience "the worst abdominal pain ever" or develop nausea or vomiting, please contact the office immediately for further recommendations for treatment.   ITCHING:  If you experience itching with your medications, try taking only a single pain pill, or even half a pain  pill at a time.  You can also use Benadryl over the counter for itching or also to help with sleep.   TED HOSE STOCKINGS:  Use stockings on both legs until for at least 2 weeks or as directed by physician office. They may be removed at night for sleeping.  MEDICATIONS:  See your medication summary on the "After Visit Summary" that nursing will review with you.  You may have some home medications which will be placed on hold until you complete the course of blood thinner medication.  It is important for you to complete the blood thinner  medication as prescribed.  PRECAUTIONS:  If you experience chest pain or shortness of breath - call 911 immediately for transfer to the hospital emergency department.   If you develop a fever greater that 101 F, purulent drainage from wound, increased redness or drainage from wound, foul odor from the wound/dressing, or calf pain - CONTACT YOUR SURGEON.                                                   FOLLOW-UP APPOINTMENTS:  If you do not already have a post-op appointment, please call the office for an appointment to be seen by your surgeon.  Guidelines for how soon to be seen are listed in your "After Visit Summary", but are typically between 1-4 weeks after surgery.  POST-OPERATIVE OPIOID TAPER INSTRUCTIONS: It is important to wean off of your opioid medication as soon as possible. If you do not need pain medication after your surgery it is ok to stop day one. Opioids include: Codeine, Hydrocodone(Norco, Vicodin), Oxycodone(Percocet, oxycontin) and hydromorphone amongst others.  Long term and even short term use of opiods can cause: Increased pain response Dependence Constipation Depression Respiratory depression And more.  Withdrawal symptoms can include Flu like symptoms Nausea, vomiting And more Techniques to manage these symptoms Hydrate well Eat regular healthy meals Stay active Use relaxation techniques(deep breathing, meditating, yoga) Do Not substitute Alcohol to help with tapering If you have been on opioids for less than two weeks and do not have pain than it is ok to stop all together.  Plan to wean off of opioids This plan should start within one week post op of your joint replacement. Maintain the same interval or time between taking each dose and first decrease the dose.  Cut the total daily intake of opioids by one tablet each day Next start to increase the time between doses. The last dose that should be eliminated is the evening dose.   MAKE SURE YOU:   Understand these instructions.  Get help right away if you are not doing well or get worse.    Thank you for letting us be a part of your medical care team.  It is a privilege we respect greatly.  We hope these instructions will help you stay on track for a fast and full recovery!

## 2022-05-02 NOTE — Op Note (Signed)
NAME: VIRAJ, LIBY MEDICAL RECORD NO: [id number] ACCOUNT NO: [id number] DATE OF BIRTH: 02-Apr-1935 FACILITY: [id number] PHYSICIAN: Madlyn Frankel. Charlann Boxer, MD  Operative Report   DATE OF PROCEDURE: 05/02/2022  PREOPERATIVE DIAGNOSIS:  History of complex multiple revision hip surgeries with recurrent postoperative seroma with uncertain etiology.  POSTOPERATIVE DIAGNOSIS:  History of complex multiple revision hip surgeries with recurrent postoperative seroma with uncertain etiology.  PROCEDURES:   1.  Evacuation of right hip seroma/hematoma. 2.  Non-excisional debridement of right hip with 1 liter normal saline solution as well as 450 mL of Prontosan antimicrobial solution. 3.  Placement of small wound VAC sponge hooked to suction.  SURGEON:  Madlyn Frankel. Charlann Boxer, MD  ASSISTANT:  Rosalene Billings, PA-C.  Note, Ms. Domenic Schwab was present for the entirety of the case from preoperative positioning, perioperative management of the operative extremity, general facilitation of the case and primary wound closure.  ANESTHESIA:  General.  BLOOD LOSS:  Minimal.  DRAINS:  As noted was the small wound VAC sponge.  COMPLICATIONS:  None.  INDICATIONS FOR PROCEDURE:  The patient is a very pleasant 87 year old male with a complex surgical history involving his right hip.  A lot of this began with metalosis type changes that he has had with subsequent revision surgery.  He has had recurrent  swelling in his right hip and thigh area since his procedures.  We have attempted multiple in office attempts at aspiration, recent surgical decompression due to the recurrence with persistent recurrent seroma and hematoma of his lateral aspect of the  hip.  Based on this, we discussed proceeding with another I and D and evacuation of the hematoma and seroma.  I discussed with him the possibilities of wound VAC to help decompress the space to allow for secondary healing deep working itself to the skin  to see if we  can get this to keep from recurring.  Risks of infection.  Discussed risk of need for future surgery including resection of his hip were all discussed and reviewed.  Consent was obtained for management of the seroma.  DESCRIPTION OF PROCEDURE:  The patient was brought to the operative theater.  Once adequate anesthesia, preoperative antibiotics, 2 grams Ancef as well as tranexamic acid were administered, he was positioned into the left lateral decubitus position with  the right hip up.  The right lower extremity was then prepped and draped in sterile fashion.  A timeout was performed identifying the patient, planned procedure, and extremity.  We identified his previous incisions over his lateral aspect of the hip.  I  chose to make an incision on the distal portion of approximately 3-4 inches.  I incised the incision noted the scar and then evacuated a very large seroma probably about 200 mL worth.  We then debrided any of the old hematoma present.  Once it was  adequately decompressed, we irrigated the wound with 1 liter normal saline solution.  I then followed this up with a bottle of the Prontosan antimicrobial solution.  Once this was completed, we cut a small wound VAC sponge to size to fit the wound and  placed in the wound and then applied dressings and then to suction.  There was good seal on the suction.  Once this was done, it was hooked to the wound VAC machine.  He was awoken from anesthesia and brought to the recovery room in stable condition,  tolerated the procedure well.  Findings reviewed with his wife.  We had anticipated 1-2  day hospital stay, we worked through the logistics of getting the wound VAC arranged for home management with nursing.   PUS D: 05/02/2022 11:13:28 am T: 05/02/2022 1:30:00 pm  JOB: [id number]/ [id number]

## 2022-05-02 NOTE — Anesthesia Procedure Notes (Signed)
Procedure Name: Intubation Date/Time: 05/02/2022 10:06 AM  Performed by: Niel Hummer, CRNAPre-anesthesia Checklist: Patient identified, Emergency Drugs available, Suction available and Patient being monitored Patient Re-evaluated:Patient Re-evaluated prior to induction Oxygen Delivery Method: Circle system utilized Preoxygenation: Pre-oxygenation with 100% oxygen Induction Type: IV induction Ventilation: Mask ventilation without difficulty Laryngoscope Size: Mac and 4 Grade View: Grade I Tube type: Oral Tube size: 7.5 mm Number of attempts: 1 Airway Equipment and Method: Stylet Placement Confirmation: ETT inserted through vocal cords under direct vision, positive ETCO2 and breath sounds checked- equal and bilateral Secured at: 23 cm Tube secured with: Tape Dental Injury: Teeth and Oropharynx as per pre-operative assessment

## 2022-05-02 NOTE — Brief Op Note (Signed)
05/02/2022  11:07 AM  PATIENT:  Erma Heritage  87 y.o. male  PRE-OPERATIVE DIAGNOSIS:  Status post right total hip revision with recurrent post op seroma  POST-OPERATIVE DIAGNOSIS:  Status post right total hip revision with recurrent post op seroma  PROCEDURE:  Procedure(s): Evacuation of right hip Seroma (Right) Placement of wound vac, right hip  SURGEON:  Surgeon(s) and Role:    Durene Romans, MD - Primary  PHYSICIAN ASSISTANT: Rosalene Billings, PA-C  ANESTHESIA:   general  EBL:  25 mL   BLOOD ADMINISTERED:none  DRAINS: small wound vac sponge placed   LOCAL MEDICATIONS USED:  NONE  SPECIMEN:  No Specimen  DISPOSITION OF SPECIMEN:  N/A  COUNTS:  YES  TOURNIQUET:  * No tourniquets in log *  DICTATION: .Other Dictation: Dictation Number [id number]  PLAN OF CARE: Admit to inpatient   PATIENT DISPOSITION:  PACU - hemodynamically stable.   Delay start of Pharmacological VTE agent (>24hrs) due to surgical blood loss or risk of bleeding: no

## 2022-05-02 NOTE — Transfer of Care (Signed)
Immediate Anesthesia Transfer of Care Note  Patient: Travis Wolfe  Procedure(s) Performed: Evacuation of right hip Seroma (Right)  Patient Location: PACU  Anesthesia Type:General  Level of Consciousness: drowsy  Airway & Oxygen Therapy: Patient Spontanous Breathing and Patient connected to face mask oxygen  Post-op Assessment: Report given to RN, Post -op Vital signs reviewed and stable, and Patient moving all extremities X 4  Post vital signs: Reviewed and stable  Last Vitals:  Vitals Value Taken Time  BP 140/64   Temp    Pulse 48 05/02/22 1059  Resp 8   SpO2 100 % 05/02/22 1059  Vitals shown include unvalidated device data.  Last Pain:  Vitals:   05/02/22 0824  TempSrc:   PainSc: 0-No pain      Patients Stated Pain Goal: 4 (05/02/22 0824)  Complications: No notable events documented.

## 2022-05-02 NOTE — H&P (Signed)
Travis Wolfe is an 87 y.o. male.    Chief Complaint: right hip recurrent seroma   HPI: Pt is a 87 y.o. male complaining of recurrent right hip swelling.  He had a complex surgical history involving the right hip.  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  He has been noted to have significant postoperative recurrent seroma to the right hip.  No fevers chills or night sweats.  PCP:  Elijio Miles., MD  D/C Plans: To be determined following appropriate treatment plan  PMH: Past Medical History:  Diagnosis Date   A-fib (HCC)    REPORTS WAS DUE TO DEHYDRATION 3 YEARS AGO BUT WAS GIVEN FLUIDS IN ED AND MEDS TO SLOW HR AND DROVE HIMSELF HOME  ; HAD ABLATION X2 WITH CARDIO DR. AKBARY AS PRECAUTION  ; has been on eliquis since but reports he has not taken it for 5 weeks b/c he was told to hold it for the multiple irrigations and debridements for the hip infection. reports his cardiologist is aware he has been holding it;    Arthritis    Atherosclerosis of both carotid arteries    BPH (benign prostatic hyperplasia)    Dysrhythmia    a-fib   Fall    fell on friday 01-16-18;   lost balAnce whIile tryng to get his sock on ;sustained skin tear to left wrist ; no drainage  , scabbed over , and has been covering with bandage    Heart murmur    SINCE HIS 20s   HLD (hyperlipidemia)    Hypertension    Non-rheumatic mitral regurgitation     PSH: Past Surgical History:  Procedure Laterality Date   BACK SURGERY  2007   L1-L5 LUMBAR WITH HARDWARE PLACED   CARDIOVERSION     CATARACT EXTRACTION, BILATERAL  2015   CRYOABLATION  2023   CARDIAC ABLATION and 2019   DACROCYSTORHINOSTOMY  07/26/2011   HEMATOMA EVACUATION Right 01/31/2022   Procedure: Evacuation of right hip hematoma;  Surgeon: Durene Romans, MD;  Location: [facility];  Service: Orthopedics;  Laterality: Right;  90 mins   HERNIA REPAIR Bilateral    hx of echocardiogram      INCISION AND DRAINAGE HIP  Right 01/26/2018   Procedure: Right hip irrigation and debridement, excisional and non excisional debridement, head liner exchange, posterior approach;  Surgeon: Durene Romans, MD;  Location: [facility];  Service: Orthopedics;  Laterality: Right;  90 mins Would like to start earlier around 2:00pm if time opens   INCISION AND DRAINAGE HIP Right 11/26/2018   Procedure: IRRIGATION AND DEBRIDEMENT HIP;  Surgeon: Durene Romans, MD;  Location: [facility];  Service: Orthopedics;  Laterality: Right;  90 mins   INCISION AND DRAINAGE HIP Right 05/17/2021   Procedure: IRRIGATION AND DEBRIDEMENT RIGHT HIP, EVACUATION OF PSEUDOTUMOR;  Surgeon: Durene Romans, MD;  Location: [facility];  Service: Orthopedics;  Laterality: Right;   INGUINAL HERNIA REPAIR Bilateral    with mesh    IR US GUIDE BX ASP/DRAIN  12/17/2017   IRRIGATION AND ASPIRATION RIGHT HIP   12/10/2017   [facility]  x2,  x1    KIDNEY STONE SURGERY     lithotripsy    KYPHOPLASTY     T7   LATERAL FUSION LUMBAR SPINE     L1-L5   PULMONARY VEIN ISOLATION AND LEFT ATRIAL ROOFLINE ABLATION   10/15/2017   DR Rudolpho Sevin    ROTATOR CUFF REPAIR Left    SHOULDER DEBRIDEMENT  Right    SHOULDER OPEN ROTATOR CUFF REPAIR Left    SHOULDER SURGERY  1988   arthroscopy     TONSILLECTOMY     TOTAL HIP ARTHROPLASTY Right 2003   TOTAL HIP ARTHROPLASTY  2010   TOTAL HIP REVISION      05-2017, 10-2016    Social History:  reports that he has never smoked. He has never used smokeless tobacco. He reports current alcohol use of about 3.0 standard drinks of alcohol per week. He reports that he does not use drugs.  Allergies:  Allergies  Allergen Reactions   Other     Cat dander   Doxycycline Rash   Horse Epithelium Rash    Medications: No medications prior to admission.    No results found for this or any previous visit (from the past 48 hour(s)). No results found.  ROS: Review of Systems - as per HPI  Physican Exam: There were no vitals taken for this  visit. Very pleasant 87 year old male awake  Chest is clear with no wheezing Regular rate No abdominal distention  Right hip exam: His surgical incision remains well-healed Significant swelling and distention to the lateral side of the hip without erythema or warmth Mild tenderness Neurovascular intact distally  Assessment/Plan Assessment: Recurrent right hip postoperative seroma  Plan: Due to the recurrent swelling in this right hip as well as the multiple discussion we have had in the office based on his age based on the potential necessity to resect his entire hip we have elected not to proceed with that morbid procedure.  Rather he is returning to the operating room today to have his right hip evacuated.  At this point I am going to place a wound VAC into this wound to allow for suction drainage to see if we can decompress the space and allow this to heal secondarily with time.  There is an increased risk of complication associated with this related to infection and thus I will consider the location of the wound VAC as we proceed with surgery.  Risks of infection noted.  Risks of recurrent seroma.  Risk of DVT noted.  Consent will be obtained for benefit of reduction of this recurrent swelling.   Madlyn Frankel Charlann Boxer, MD  05/02/2022, 7:38 AM

## 2022-05-03 ENCOUNTER — Other Ambulatory Visit: Payer: Self-pay

## 2022-05-03 ENCOUNTER — Encounter (HOSPITAL_COMMUNITY): Payer: Self-pay | Admitting: Orthopedic Surgery

## 2022-05-03 DIAGNOSIS — M96842 Postprocedural seroma of a musculoskeletal structure following a musculoskeletal system procedure: Secondary | ICD-10-CM | POA: Diagnosis not present

## 2022-05-03 LAB — BASIC METABOLIC PANEL
Anion gap: 4 — ABNORMAL LOW (ref 5–15)
BUN: 19 mg/dL (ref 8–23)
CO2: 23 mmol/L (ref 22–32)
Calcium: 8.1 mg/dL — ABNORMAL LOW (ref 8.9–10.3)
Chloride: 110 mmol/L (ref 98–111)
Creatinine, Ser: 1.03 mg/dL (ref 0.61–1.24)
GFR, Estimated: >60 mL/min (ref >60)
Glucose, Bld: 157 mg/dL — ABNORMAL HIGH (ref 70–99)
Potassium: 4.4 mmol/L (ref 3.5–5.1)
Sodium: 137 mmol/L (ref 135–145)

## 2022-05-03 LAB — CBC
HCT: 35.8 % — ABNORMAL LOW (ref 39.0–52.0)
Hemoglobin: 11.9 g/dL — ABNORMAL LOW (ref 13.0–17.0)
MCH: 33 pg (ref 26.0–34.0)
MCHC: 33.2 g/dL (ref 30.0–36.0)
MCV: 99.2 fL (ref 80.0–100.0)
Platelets: 111 10*3/uL — ABNORMAL LOW (ref 150–400)
RBC: 3.61 MIL/uL — ABNORMAL LOW (ref 4.22–5.81)
RDW: 14.1 % (ref 11.5–15.5)
WBC: 8.5 10*3/uL (ref 4.0–10.5)
nRBC: 0 % (ref 0.0–0.2)

## 2022-05-03 MED ORDER — HYDROCODONE-ACETAMINOPHEN 5-325 MG PO TABS: 1.0000 | ORAL_TABLET | ORAL | 0 refills | Status: DC | PRN

## 2022-05-03 MED ORDER — METHOCARBAMOL 500 MG PO TABS: 500.0000 mg | ORAL_TABLET | Freq: Four times a day (QID) | ORAL | 0 refills | Status: DC | PRN

## 2022-05-03 MED ORDER — DOCUSATE SODIUM 100 MG PO CAPS: 100.0000 mg | ORAL_CAPSULE | Freq: Two times a day (BID) | ORAL | 0 refills | Status: DC

## 2022-05-03 MED ORDER — CEFADROXIL 500 MG PO CAPS: 500.0000 mg | ORAL_CAPSULE | Freq: Two times a day (BID) | ORAL | 0 refills | Status: AC

## 2022-05-03 NOTE — Evaluation (Signed)
Physical Therapy One Time Evaluation Patient Details Name: Travis Wolfe MRN: [id number] DOB: 12/14/1934 Today's Date: 05/03/2022  History of Present Illness  Pt is an 87 year old male s/p Evacuation of right hip seroma/hematoma and placement of wound vac on 05/02/22.  PMHx: s/p IRRIGATION AND DEBRIDEMENT RIGHT HIP, EVACUATION OF PSEUDOTUMOR on 05/17/21, R THA with history of infection and I&D, PAF, falls, HLD, HTN, lumbar fusion, kyphoplasty, L RTC repair  Clinical Impression  Patient evaluated by Physical Therapy with no further acute PT needs identified. All education has been completed and the patient has no further questions.  Pt with history of multiple hip surgeries.  Pt ambulated in hallway and mobilizing well. Spouse present and both pt and spouse feel able for pt to manage at home.  Pt eager to d/c once wound vac care established.  Pt does reports hx of recent BPPV and had treatment at [facility].  Pt reports doing the Epley maneuver at home and has had no further symptoms.  Pt plans to have f/u visit for this too. See below for any follow-up Physical Therapy or equipment needs. PT is signing off. Thank you for this referral.        Recommendations for follow up therapy are one component of a multi-disciplinary discharge planning process, led by the attending physician.  Recommendations may be updated based on patient status, additional functional criteria and insurance authorization.  Follow Up Recommendations Follow physician's recommendations for discharge plan and follow up therapies      Assistance Recommended at Discharge PRN  Patient can return home with the following  Help with stairs or ramp for entrance    Equipment Recommendations None recommended by PT  Recommendations for Other Services       Functional Status Assessment Patient has had a recent decline in their functional status and demonstrates the ability to make significant improvements in function in a reasonable  and predictable amount of time.     Precautions / Restrictions Precautions Precaution Comments: wound vac Restrictions Weight Bearing Restrictions: No      Mobility  Bed Mobility Overal bed mobility: Needs Assistance Bed Mobility: Supine to Sit     Supine to sit: Supervision, HOB elevated     General bed mobility comments: pt self assisted R LE over EOB    Transfers Overall transfer level: Needs assistance Equipment used: Rolling walker (2 wheels) Transfers: Sit to/from Stand Sit to Stand: Min assist           General transfer comment: min assist to rise and steady (had not been OOB since surgery)    Ambulation/Gait Ambulation/Gait assistance: Min guard, Supervision Gait Distance (Feet): 400 Feet Assistive device: Rolling walker (2 wheels) Gait Pattern/deviations: Step-through pattern, Decreased stride length       General Gait Details: slow but steady with RW, pt denies symptoms  Stairs            Wheelchair Mobility    Modified Rankin (Stroke Patients Only)       Balance                                             Pertinent Vitals/Pain Pain Assessment Pain Assessment: No/denies pain    Home Living Family/patient expects to be discharged to:: Private residence Living Arrangements: Spouse/significant other   Type of Home: House Home Access: Stairs to enter Entrance Stairs-Rails:  Right;Left Entrance Stairs-Number of Steps: 3 deep   Home Layout: Able to live on main level with bedroom/bathroom Home Equipment: Rolling Walker (2 wheels);Cane - single point      Prior Function Prior Level of Function : Independent/Modified Independent                     Hand Dominance        Extremity/Trunk Assessment        Lower Extremity Assessment Lower Extremity Assessment: RLE deficits/detail RLE Deficits / Details: self assists R LE, s/p multiple hip surgeries       Communication   Communication: No  difficulties  Cognition Arousal/Alertness: Awake/alert Behavior During Therapy: WFL for tasks assessed/performed Overall Cognitive Status: Within Functional Limits for tasks assessed                                          General Comments      Exercises     Assessment/Plan    PT Assessment All further PT needs can be met in the next venue of care  PT Problem List         PT Treatment Interventions      PT Goals (Current goals can be found in the Care Plan section)  Acute Rehab PT Goals PT Goal Formulation: All assessment and education complete, DC therapy    Frequency       Co-evaluation               AM-PAC PT "6 Clicks" Mobility  Outcome Measure Help needed turning from your back to your side while in a flat bed without using bedrails?: A Little Help needed moving from lying on your back to sitting on the side of a flat bed without using bedrails?: A Little Help needed moving to and from a bed to a chair (including a wheelchair)?: A Little Help needed standing up from a chair using your arms (e.g., wheelchair or bedside chair)?: A Little Help needed to walk in hospital room?: A Little Help needed climbing 3-5 steps with a railing? : A Little 6 Click Score: 18    End of Session   Activity Tolerance: Patient tolerated treatment well Patient left: in chair;with call bell/phone within reach;with family/visitor present Nurse Communication: Mobility status PT Visit Diagnosis: Difficulty in walking, not elsewhere classified (R26.2)    Time: 1696-7893 PT Time Calculation (min) (ACUTE ONLY): 23 min   Charges:   PT Evaluation $PT Eval Low Complexity: 1 Low          Kati PT, DPT Physical Therapist Acute Rehabilitation Services Preferred contact method: Secure Chat Weekend Pager Only: [phone] Office: [phone]  Myrtis Hopping [place] 05/03/2022, 11:43 AM

## 2022-05-03 NOTE — Progress Notes (Signed)
Patient ID: LASH MATULICH, male   DOB: 1935-05-18, 87 y.o.   MRN: [id number] Subjective: 1 Day Post-Op Procedure(s) (LRB): Evacuation of right hip Seroma (Right)    Patient reports pain as mild. No events reported.  Objective:   VITALS:   Vitals:   05/03/22 0225 05/03/22 0610  BP: 109/63 (!) 111/58  Pulse: (!) 50 (!) 51  Resp: 15 15  Temp: 97.8 F (36.6 C) 97.6 F (36.4 C)  SpO2: 97% 96%    Neurovascular intact Incision: dressing C/D/I, right thigh wound vac with good suction, no obvious fluid accumulation at this point  LABS Recent Labs    05/03/22 0330  HGB 11.9*  HCT 35.8*  WBC 8.5  PLT 111*    Recent Labs    05/03/22 0330  NA 137  K 4.4  BUN 19  CREATININE 1.03  GLUCOSE 157*    No results for input(s): "LABPT", "INR" in the last 72 hours.   Assessment/Plan: 1 Day Post-Op Procedure(s) (LRB): Evacuation of right hip Seroma (Right)   Up with therapy Home today if can arrange for home wound vac management at least with regards to getting appropriate equipment to their home We will arrange to change the sponge in the office next week and likely at the office until it is discontinued. Cefadroxil for prophylaxis while wound vac being used

## 2022-05-03 NOTE — Plan of Care (Signed)
  Problem: Activity: Goal: Ability to avoid complications of mobility impairment will improve Outcome: Progressing Goal: Ability to tolerate increased activity will improve Outcome: Progressing   Problem: Clinical Measurements: Goal: Postoperative complications will be avoided or minimized Outcome: Progressing   Problem: Pain Management: Goal: Pain level will decrease with appropriate interventions Outcome: Progressing   

## 2022-05-03 NOTE — TOC Transition Note (Signed)
Transition of Care [facility]) - CM/SW Discharge Note   Patient Details  Name: Travis Wolfe MRN: [id number] Date of Birth: 04-15-1935  [facility]) CM/SW Contact:  Amada Jupiter, LCSW Phone Number: 05/03/2022, 1:35 PM   Clinical Narrative:    Alerted yesterday by Ortho PA that pt will need [employer] wound VAC for home use.  Pt agreeable with CSW placing order which was completed today and VAC has been delivered to room.  Plan for [facility] dressing changes to be done at MD office and pt aware.  No further TOC needs.   Final next level of care: Home/Self Care Barriers to Discharge: No Barriers Identified   Patient Goals and CMS Choice        Discharge Placement                       Discharge Plan and Services                DME Arranged: Negative pressure wound device DME Agency: [employer] Date DME Agency Contacted: 05/02/22 Time DME Agency Contacted: 1530 Representative spoke with at DME Agency: Blossom Hoops            Social Determinants of Health (SDOH) Interventions     Readmission Risk Interventions     No data to display

## 2022-05-03 NOTE — Plan of Care (Signed)
  Problem: Education: Goal: Knowledge of the prescribed therapeutic regimen will improve Outcome: Adequate for Discharge Goal: Understanding of discharge needs will improve Outcome: Adequate for Discharge   Problem: Activity: Goal: Ability to avoid complications of mobility impairment will improve 05/03/2022 1435 by Charlcie Cradle, LPN Outcome: Adequate for Discharge 05/03/2022 1046 by Charlcie Cradle, LPN Outcome: Progressing Goal: Ability to tolerate increased activity will improve 05/03/2022 1435 by Charlcie Cradle, LPN Outcome: Adequate for Discharge 05/03/2022 1046 by Charlcie Cradle, LPN Outcome: Progressing   Problem: Clinical Measurements: Goal: Postoperative complications will be avoided or minimized 05/03/2022 1435 by Charlcie Cradle, LPN Outcome: Adequate for Discharge 05/03/2022 1046 by Charlcie Cradle, LPN Outcome: Progressing   Problem: Pain Management: Goal: Pain level will decrease with appropriate interventions 05/03/2022 1435 by Charlcie Cradle, LPN Outcome: Adequate for Discharge 05/03/2022 1046 by Charlcie Cradle, LPN Outcome: Progressing   Problem: Skin Integrity: Goal: Will show signs of wound healing Outcome: Adequate for Discharge   Problem: Clinical Measurements: Goal: Ability to maintain clinical measurements within normal limits will improve Outcome: Adequate for Discharge Goal: Will remain free from infection Outcome: Adequate for Discharge Goal: Diagnostic test results will improve Outcome: Adequate for Discharge Goal: Respiratory complications will improve Outcome: Adequate for Discharge Goal: Cardiovascular complication will be avoided Outcome: Adequate for Discharge   Problem: Activity: Goal: Risk for activity intolerance will decrease Outcome: Adequate for Discharge   Problem: Nutrition: Goal: Adequate nutrition will be maintained Outcome: Adequate for Discharge   Problem: Coping: Goal: Level of anxiety will decrease Outcome:  Adequate for Discharge   Problem: Elimination: Goal: Will not experience complications related to bowel motility Outcome: Adequate for Discharge Goal: Will not experience complications related to urinary retention Outcome: Adequate for Discharge   Problem: Pain Managment: Goal: General experience of comfort will improve Outcome: Adequate for Discharge   Problem: Safety: Goal: Ability to remain free from injury will improve Outcome: Adequate for Discharge   Problem: Skin Integrity: Goal: Risk for impaired skin integrity will decrease Outcome: Adequate for Discharge

## 2022-05-16 NOTE — Discharge Summary (Signed)
Patient ID: Travis Wolfe MRN: [id number] DOB/AGE: 1935-04-05 88 y.o.  Admit date: 05/02/2022 Discharge date: 05/03/2022  Admission Diagnoses:  Post-operative seroma, right hip   Discharge Diagnoses:  Principal Problem:   Postoperative seroma of subcutaneous tissue after non-dermatologic procedure   Past Medical History:  Diagnosis Date   A-fib (HCC)    REPORTS WAS DUE TO DEHYDRATION 3 YEARS AGO BUT WAS GIVEN FLUIDS IN ED AND MEDS TO SLOW HR AND DROVE HIMSELF HOME  ; HAD ABLATION X2 WITH CARDIO DR. AKBARY AS PRECAUTION  ; has been on eliquis since but reports he has not taken it for 5 weeks b/c he was told to hold it for the multiple irrigations and debridements for the hip infection. reports his cardiologist is aware he has been holding it;    Arthritis    Atherosclerosis of both carotid arteries    BPH (benign prostatic hyperplasia)    Dysrhythmia    a-fib   Fall    fell on friday 01-16-18;   lost balAnce whIile tryng to get his sock on ;sustained skin tear to left wrist ; no drainage  , scabbed over , and has been covering with bandage    Heart murmur    SINCE HIS 20s   HLD (hyperlipidemia)    Hypertension    Non-rheumatic mitral regurgitation     Surgeries: Procedure(s): Evacuation of right hip Seroma on 05/02/2022   Consultants:   Discharged Condition: Improved  Hospital Course: Travis Wolfe is an 87 y.o. male who was admitted 05/02/2022 for operative treatment ofPostoperative seroma of subcutaneous tissue after non-dermatologic procedure. Patient has severe unremitting pain that affects sleep, daily activities, and work/hobbies. After pre-op clearance the patient was taken to the operating room on 05/02/2022 and underwent  Procedure(s): Evacuation of right hip Seroma.    Patient was given perioperative antibiotics:  Anti-infectives (From admission, onward)    Start     Dose/Rate Route Frequency Ordered Stop   05/03/22 1000  cefadroxil (DURICEF) capsule 500 mg   Status:  Discontinued        500 mg Oral 2 times daily 05/02/22 1121 05/03/22 2013   05/03/22 0000  cefadroxil (DURICEF) 500 MG capsule        500 mg Oral 2 times daily 05/03/22 1307 05/17/22 2359   05/02/22 1600  ceFAZolin (ANCEF) IVPB 2g/100 mL premix        2 g 200 mL/hr over 30 Minutes Intravenous Every 6 hours 05/02/22 1226 05/02/22 2209   05/02/22 0815  ceFAZolin (ANCEF) IVPB 2g/100 mL premix        2 g 200 mL/hr over 30 Minutes Intravenous On call to O.R. 05/02/22 0806 05/02/22 1037        Patient was given sequential compression devices, early ambulation, and chemoprophylaxis to prevent DVT. Patient worked with PT and was meeting their goals regarding safe ambulation and transfers.  Patient benefited maximally from hospital stay and there were no complications.    Recent vital signs: No data found.   Recent laboratory studies: No results for input(s): "WBC", "HGB", "HCT", "PLT", "NA", "K", "CL", "CO2", "BUN", "CREATININE", "GLUCOSE", "INR", "CALCIUM" in the last 72 hours.  Invalid input(s): "PT", "2"   Discharge Medications:   Allergies as of 05/03/2022       Reactions   Other    Cat dander   Doxycycline Rash   Horse Epithelium Rash        Medication List     STOP taking these medications  acetaminophen 325 MG tablet Commonly known as: TYLENOL       TAKE these medications    atorvastatin 40 MG tablet Commonly known as: LIPITOR Take 40 mg by mouth daily.   Biotin 5 MG Caps Take 5 mg by mouth daily.   Calcium-Magnesium-Zinc Tabs Take 1 tablet by mouth daily.   cefadroxil 500 MG capsule Commonly known as: DURICEF Take 1 capsule (500 mg total) by mouth 2 (two) times daily for 14 days.   Cosopt PF 2-0.5 % Soln Generic drug: Dorzolamide HCl-Timolol Mal PF Place 1 drop into both eyes in the morning and at bedtime.   docusate sodium 100 MG capsule Commonly known as: COLACE Take 1 capsule (100 mg total) by mouth 2 (two) times daily.   Eliquis  5 MG Tabs tablet Generic drug: apixaban Take 2.5 mg by mouth 2 (two) times daily.   Eszopiclone 3 MG Tabs Take 3 mg by mouth at bedtime.   furosemide 20 MG tablet Commonly known as: LASIX Take 20 mg by mouth daily as needed for fluid.   HYDROcodone-acetaminophen 5-325 MG tablet Commonly known as: NORCO/VICODIN Take 1 tablet by mouth every 4 (four) hours as needed for severe pain.   Lutein 20 MG Tabs Take 20 mg by mouth daily.   LYCOPENE PO Take 20 mg by mouth daily.   methocarbamol 500 MG tablet Commonly known as: ROBAXIN Take 1 tablet (500 mg total) by mouth every 6 (six) hours as needed for muscle spasms.   polyethylene glycol 17 g packet Commonly known as: MIRALAX / GLYCOLAX Take 17 g by mouth daily as needed for mild constipation.   potassium chloride 10 MEQ tablet Commonly known as: KLOR-CON Take 10-20 mEq by mouth See admin instructions. Take 10 mEq by mouth every other day, alternating with 20 mEq on alternate days.   SLOW RELEASE IRON PO Take 0.5 tablets by mouth in the morning and at bedtime.   tadalafil 5 MG tablet Commonly known as: CIALIS Take 5 mg by mouth daily.   valACYclovir 1000 MG tablet Commonly known as: VALTREX Take 1,000 mg by mouth daily as needed (fever blister).   Vitamin D 50 MCG (2000 UT) tablet Take 2,000 Units by mouth daily.   zinc gluconate 50 MG tablet Take 25 mg by mouth 2 (two) times daily.        Diagnostic Studies: No results found.  Disposition: Discharge disposition: 01-Home or Self Care       Discharge Instructions     Call MD / Call 911   Complete by: As directed    If you experience chest pain or shortness of breath, CALL 911 and be transported to the hospital emergency room.  If you develope a fever above 101 F, pus (white drainage) or increased drainage or redness at the wound, or calf pain, call your surgeon's office.   Constipation Prevention   Complete by: As directed    Drink plenty of fluids.  Prune  juice may be helpful.  You may use a stool softener, such as Colace (over the counter) 100 mg twice a day.  Use MiraLax (over the counter) for constipation as needed.   Diet - low sodium heart healthy   Complete by: As directed    Increase activity slowly as tolerated   Complete by: As directed    Weight bearing as tolerated with assist device (walker, cane, etc) as directed, use it as long as suggested by your surgeon or therapist, typically at least 4-6 weeks.  Post-operative opioid taper instructions:   Complete by: As directed    POST-OPERATIVE OPIOID TAPER INSTRUCTIONS: It is important to wean off of your opioid medication as soon as possible. If you do not need pain medication after your surgery it is ok to stop day one. Opioids include: Codeine, Hydrocodone(Norco, Vicodin), Oxycodone(Percocet, oxycontin) and hydromorphone amongst others.  Long term and even short term use of opiods can cause: Increased pain response Dependence Constipation Depression Respiratory depression And more.  Withdrawal symptoms can include Flu like symptoms Nausea, vomiting And more Techniques to manage these symptoms Hydrate well Eat regular healthy meals Stay active Use relaxation techniques(deep breathing, meditating, yoga) Do Not substitute Alcohol to help with tapering If you have been on opioids for less than two weeks and do not have pain than it is ok to stop all together.  Plan to wean off of opioids This plan should start within one week post op of your joint replacement. Maintain the same interval or time between taking each dose and first decrease the dose.  Cut the total daily intake of opioids by one tablet each day Next start to increase the time between doses. The last dose that should be eliminated is the evening dose.      TED hose   Complete by: As directed    Use stockings (TED hose) for 2 weeks on both leg(s).  You may remove them at night for sleeping.         Follow-up Information     Durene Romans, MD. Schedule an appointment as soon as possible for a visit in 1 week(s).   Specialty: Orthopedic Surgery Contact information: [address] [place] [address] [place] [address] [phone]                  Signed: Cassandria Anger 05/16/2022, 12:09 PM

## 2022-05-30 NOTE — Progress Notes (Signed)
Sent message, via epic in basket, requesting orders in epic from surgeon.  

## 2022-06-01 NOTE — Progress Notes (Signed)
COVID Vaccine received:  _0  No _1  Yes Date of any COVID positive Test in last 90 days:  PCP - Gwenyth Allegra, MD Cardiologist - Pat Patrick, MD  Chest x-ray -  EKG - 09-11-21  in CE, scanned to [facility] media 02-05-22 correspondence. Stress Test -  ECHO - 04-10-2022  CE Cardiac Cath -   PCR screen: _2  Ordered & Completed                      _3   No Order but Needs PROFEND                      _4   N/A for this surgery  Surgery Plan:  _5  Ambulatory                            _6  Outpatient in bed                            _7  Admit  Anesthesia:    _8  General  _9  Spinal                           _10   Choice _11   MAC   Pacemaker / ICD device _12  No _13  Yes        Device order form faxed _14  No    _15   Yes      Faxed to:  Spinal Cord Stimulator:_16  No _17  Yes      (Remind patient to bring remote DOS) Other Implants:   History of Sleep Apnea? _18  No _19  Yes   CPAP used?- _20  No _21  Yes    Does the patient monitor blood sugar? _22  No _23  Yes  _24  N/A  Blood Thinner / Instructions: Aspirin Instructions:  Eliquis 2.5 bid   ???? 3 day hold, patient to talk to Dr. Phillis Haggis and he knows the importance of following up on this Hold.  Aspirin Instructions: None   Patient states that he also stopped taking his Amiodarone yesterday (04-22-22) Per Dr. Phillis Haggis d/t some vision changes and "eye problems". He is to hold his Amiodarone x 20 days per Phillis Haggis and then "they will re-evaluate his eyes".     ERAS Protocol Ordered: _25  No  _26  Yes PRE-SURGERY _27  ENSURE  _28  G2  _29  No Drink Ordered  Patient is to be NPO after:    Activity level: Patient can / can not climb a flight of stairs without difficulty; _30  No CP  _31  No SOB, but would have ______   Patient can / can not perform ADLs without assistance.   Anesthesia review: A.fib (ablations 2019, 2023), HTN, Bradycardia, MR on ECHO.   Patient denies shortness of breath, fever, cough and chest pain at PAT appointment.  Patient verbalized understanding and  agreement to the Pre-Surgical Instructions that were given to them at this PAT appointment. Patient was also educated of the need to review these PAT instructions again prior to his/her surgery.I reviewed the appropriate phone numbers to call if they have any and questions or concerns.

## 2022-06-01 NOTE — Patient Instructions (Signed)
SURGICAL WAITING ROOM VISITATION Patients having surgery or a procedure may have no more than 2 support people in the waiting area - these visitors may rotate in the visitor waiting room.   Children under the age of 13 must have an adult with them who is not the patient. If the patient needs to stay at the hospital during part of their recovery, the visitor guidelines for inpatient rooms apply.  PRE-OP VISITATION  Pre-op nurse will coordinate an appropriate time for 1 support person to accompany the patient in pre-op.  This support person may not rotate.  This visitor will be contacted when the time is appropriate for the visitor to come back in the pre-op area.  Please refer to the [facility] website for the visitor guidelines for Inpatients (after your surgery is over and you are in a regular room).  You are not required to quarantine at this time prior to your surgery. However, you must do this: Hand Hygiene often Do NOT share personal items Notify your provider if you are in close contact with someone who has COVID or you develop fever 100.4 or greater, new onset of sneezing, cough, sore throat, shortness of breath or body aches.  If you test positive for Covid or have been in contact with anyone that has tested positive in the last 10 days please notify you surgeon.    Your procedure is scheduled on:  Thursday  June 06, 2022  Report to [facility] Main Entrance: [place] entrance where the [employer] is available.   Report to admitting at: 08:15 AM  +++++Call this number if you have any questions or problems the morning of surgery [phone]  Do not eat food after Midnight the night prior to your surgery/procedure.  After Midnight you may have the following liquids until  ???       AM / PM DAY OF SURGERY  Clear Liquid Diet Water Black Coffee (sugar ok, NO MILK/CREAM OR CREAMERS)  Tea (sugar ok, NO MILK/CREAM OR CREAMERS) regular and decaf                              Plain Jell-O  with no fruit (NO RED)                                           Fruit ices (not with fruit pulp, NO RED)                                     Popsicles (NO RED)                                                                  Juice: apple, WHITE grape, WHITE cranberry Sports drinks like Gatorade or Powerade (NO RED)                   The day of surgery:  Drink ONE (1) Pre-Surgery Clear Ensure or G2 at        AM the morning of surgery. Drink  in one sitting. Do not sip.  This drink was given to you during your hospital pre-op appointment visit. Nothing else to drink after completing the Pre-Surgery Clear Ensure or G2 : No candy, chewing gum or throat lozenges.    FOLLOW  ANY ADDITIONAL PRE OP INSTRUCTIONS YOU RECEIVED FROM YOUR SURGEON'S OFFICE!!!   Oral Hygiene is also important to reduce your risk of infection.        Remember - BRUSH YOUR TEETH THE MORNING OF SURGERY WITH YOUR REGULAR TOOTHPASTE  Take ONLY these medicines the morning of surgery with A SIP OF WATER: Ranolazine (Ranexa), cefadroxil (Duricef). You may use your Cosopt Eye drops and Tylenol.                   You may not have any metal on your body including jewelry, and body piercing  Do not wear  lotions, powders,  cologne, or deodorant  Men may shave face and neck.  Contacts, Hearing Aids, dentures or bridgework may not be worn into surgery. DENTURES WILL BE REMOVED PRIOR TO SURGERY PLEASE DO NOT APPLY "Poly grip" OR ADHESIVES!!!  You may bring a small overnight bag with you on the day of surgery, only pack items that are not valuable. [facility] IS NOT RESPONSIBLE   FOR VALUABLES THAT ARE LOST OR STOLEN.   Do not bring your home medications to the hospital. The Pharmacy will dispense medications listed on your medication list to you during your admission in the Hospital.  Special Instructions: Bring a copy of your healthcare power of attorney and living will documents the day of surgery, if you  wish to have them scanned into your [employer] Medical Records- EPIC  Please read over the following fact sheets you were given: IF YOU HAVE QUESTIONS ABOUT YOUR PRE-OP INSTRUCTIONS, PLEASE CALL [phone]  (KAY)   [place] - Preparing for Surgery Before surgery, you can play an important role.  Because skin is not sterile, your skin needs to be as free of germs as possible.  You can reduce the number of germs on your skin by washing with CHG (chlorahexidine gluconate) soap before surgery.  CHG is an antiseptic cleaner which kills germs and bonds with the skin to continue killing germs even after washing. Please DO NOT use if you have an allergy to CHG or antibacterial soaps.  If your skin becomes reddened/irritated stop using the CHG and inform your nurse when you arrive at Short Stay. Do not shave (including legs and underarms) for at least 48 hours prior to the first CHG shower.  You may shave your face/neck.  Please follow these instructions carefully:  1.  Shower with CHG Soap the night before surgery and the  morning of surgery.  2.  If you choose to wash your hair, wash your hair first as usual with your normal  shampoo.  3.  After you shampoo, rinse your hair and body thoroughly to remove the shampoo.                             4.  Use CHG as you would any other liquid soap.  You can apply chg directly to the skin and wash.  Gently with a scrungie or clean washcloth.  5.  Apply the CHG Soap to your body ONLY FROM THE NECK DOWN.   Do not use on face/ open  Wound or open sores. Avoid contact with eyes, ears mouth and genitals (private parts).                       Wash face,  Genitals (private parts) with your normal soap.             6.  Wash thoroughly, paying special attention to the area where your  surgery  will be performed.  7.  Thoroughly rinse your body with warm water from the neck down.  8.  DO NOT shower/wash with your normal soap after using and  rinsing off the CHG Soap.            9.  Pat yourself dry with a clean towel.            10.  Wear clean pajamas.            11.  Place clean sheets on your bed the night of your first shower and do not  sleep with pets.  ON THE DAY OF SURGERY : Do not apply any lotions/deodorants the morning of surgery.  Please wear clean clothes to the hospital/surgery center.    FAILURE TO FOLLOW THESE INSTRUCTIONS MAY RESULT IN THE CANCELLATION OF YOUR SURGERY  PATIENT SIGNATURE_________________________________  NURSE SIGNATURE__________________________________  ________________________________________________________________________

## 2022-06-03 ENCOUNTER — Encounter (HOSPITAL_COMMUNITY): Payer: Self-pay

## 2022-06-03 ENCOUNTER — Encounter (HOSPITAL_COMMUNITY)
Admission: RE | Admit: 2022-06-03 | Discharge: 2022-06-03 | Disposition: A | Discharge status: Home / Self Care | Payer: Medicare PPO | Source: Ambulatory Visit | Attending: Orthopedic Surgery | Admitting: Orthopedic Surgery

## 2022-06-03 ENCOUNTER — Other Ambulatory Visit: Payer: Self-pay

## 2022-06-03 VITALS — BP 125/69 | HR 69 | Temp 98.5°F | Resp 16 | Ht 66.5 in | Wt 148.0 lb

## 2022-06-03 DIAGNOSIS — Z01812 Encounter for preprocedural laboratory examination: Secondary | ICD-10-CM | POA: Insufficient documentation

## 2022-06-03 DIAGNOSIS — I10 Essential (primary) hypertension: Secondary | ICD-10-CM | POA: Insufficient documentation

## 2022-06-03 DIAGNOSIS — T8451XA Infection and inflammatory reaction due to internal right hip prosthesis, initial encounter: Secondary | ICD-10-CM | POA: Diagnosis not present

## 2022-06-03 DIAGNOSIS — Z01818 Encounter for other preprocedural examination: Secondary | ICD-10-CM

## 2022-06-03 LAB — CBC
HCT: 38.4 % — ABNORMAL LOW (ref 39.0–52.0)
Hemoglobin: 12.3 g/dL — ABNORMAL LOW (ref 13.0–17.0)
MCH: 32 pg (ref 26.0–34.0)
MCHC: 32 g/dL (ref 30.0–36.0)
MCV: 100 fL (ref 80.0–100.0)
Platelets: 288 10*3/uL (ref 150–400)
RBC: 3.84 MIL/uL — ABNORMAL LOW (ref 4.22–5.81)
RDW: 13.9 % (ref 11.5–15.5)
WBC: 10.5 10*3/uL (ref 4.0–10.5)
nRBC: 0 % (ref 0.0–0.2)

## 2022-06-03 LAB — BASIC METABOLIC PANEL
Anion gap: 6 (ref 5–15)
BUN: 16 mg/dL (ref 8–23)
CO2: 25 mmol/L (ref 22–32)
Calcium: 8.6 mg/dL — ABNORMAL LOW (ref 8.9–10.3)
Chloride: 106 mmol/L (ref 98–111)
Creatinine, Ser: 1.02 mg/dL (ref 0.61–1.24)
GFR, Estimated: >60 mL/min (ref >60)
Glucose, Bld: 101 mg/dL — ABNORMAL HIGH (ref 70–99)
Potassium: 4.6 mmol/L (ref 3.5–5.1)
Sodium: 137 mmol/L (ref 135–145)

## 2022-06-03 LAB — SURGICAL PCR SCREEN
MRSA, PCR: NEGATIVE
Staphylococcus aureus: NEGATIVE

## 2022-06-03 NOTE — Progress Notes (Signed)
Sent 2nd IB message to Rosalene Billings PA, requesting orders

## 2022-06-05 ENCOUNTER — Encounter (HOSPITAL_BASED_OUTPATIENT_CLINIC_OR_DEPARTMENT_OTHER): Payer: Self-pay | Admitting: Emergency Medicine

## 2022-06-05 ENCOUNTER — Other Ambulatory Visit: Payer: Self-pay

## 2022-06-05 ENCOUNTER — Emergency Department (HOSPITAL_BASED_OUTPATIENT_CLINIC_OR_DEPARTMENT_OTHER): Payer: Medicare PPO

## 2022-06-05 ENCOUNTER — Emergency Department (HOSPITAL_BASED_OUTPATIENT_CLINIC_OR_DEPARTMENT_OTHER)
Admission: EM | Admit: 2022-06-05 | Discharge: 2022-06-05 | Disposition: A | Discharge status: Home / Self Care | Payer: Medicare PPO | Source: Home / Self Care | Attending: Emergency Medicine | Admitting: Emergency Medicine

## 2022-06-05 DIAGNOSIS — S161XXA Strain of muscle, fascia and tendon at neck level, initial encounter: Secondary | ICD-10-CM | POA: Insufficient documentation

## 2022-06-05 DIAGNOSIS — Z7901 Long term (current) use of anticoagulants: Secondary | ICD-10-CM | POA: Insufficient documentation

## 2022-06-05 DIAGNOSIS — W010XXA Fall on same level from slipping, tripping and stumbling without subsequent striking against object, initial encounter: Secondary | ICD-10-CM | POA: Insufficient documentation

## 2022-06-05 MED ORDER — LIDOCAINE 5 % EX PTCH: 1.0000 | MEDICATED_PATCH | CUTANEOUS | 0 refills | Status: AC

## 2022-06-05 MED ORDER — LIDOCAINE 5 % EX PTCH
1.0000 | MEDICATED_PATCH | Freq: Once | CUTANEOUS | Status: DC
  Administered 2022-06-05: 1 via TRANSDERMAL
  Filled 2022-06-05: qty 1

## 2022-06-05 MED ORDER — ACETAMINOPHEN 500 MG PO TABS
1000.0000 mg | ORAL_TABLET | Freq: Once | ORAL | Status: AC
  Administered 2022-06-05: 1000 mg via ORAL
  Filled 2022-06-05: qty 2

## 2022-06-05 NOTE — ED Provider Notes (Signed)
[facility] EMERGENCY DEPARTMENT Provider Note   CSN: [id number] Arrival date & time: 06/05/22  1533     History  Chief Complaint  Patient presents with   Fall   Headache    Travis Wolfe is a 87 y.o. male.  Patient is an 87 year old male with a past medical history of A-fib on Eliquis senting to the emergency department after a fall.  The patient states that 3 days ago he tripped over a curb and fell onto his right side.  He denies hitting his head or losing consciousness.  He denies any chest pain, shortness of breath or lightheadedness prior to the fall.  He states that he initially felt okay and was able to get up and ambulate at the scene, however over the last 2 days he has had increasing pain on the right side of his neck with an associated right-sided headache.  He denies any nausea or vomiting, numbness or weakness or vision changes.  Patient reports that he has held his Eliquis for the past 7 days due to his upcoming procedure on his hip.  He states he has been taking Tylenol for pain at home.  The history is provided by the patient.  Fall Associated symptoms include headaches.  Headache      Home Medications Prior to Admission medications   Medication Sig Start Date End Date Taking? Authorizing Provider  lidocaine (LIDODERM) 5 % Place 1 patch onto the skin daily. Remove & Discard patch within 12 hours or as directed by MD 06/05/22  Yes Theresia Lo, Cecile Sheerer, DO  acetaminophen (TYLENOL) 325 MG suppository Place 325 mg rectally every 4 (four) hours as needed for moderate pain.    [provider]  apixaban (ELIQUIS) 5 MG TABS tablet Take 2.5 mg by mouth daily.    [provider]  Biotin 5 MG CAPS Take 5 mg by mouth daily.    [provider]  cefadroxil (DURICEF) 500 MG capsule Take 1,000 mg by mouth daily.    [provider]  Cholecalciferol (VITAMIN D) 50 MCG (2000 UT) tablet Take 2,000 Units by mouth daily.    [provider]  COSOPT PF 22.3-6.8 MG/ML SOLN ophthalmic solution Place 1 drop into both eyes in the morning and at bedtime. 10/22/17   [provider]  docusate sodium (COLACE) 100 MG capsule Take 1 capsule (100 mg total) by mouth 2 (two) times daily. Patient taking differently: Take 100 mg by mouth daily as needed. 05/03/22   Cassandria Anger, PA-C  Eszopiclone 3 MG TABS Take 3 mg by mouth at bedtime. 02/12/18   [provider]  Ferrous Sulfate (SLOW RELEASE IRON PO) Take 1 tablet by mouth daily.    [provider]  furosemide (LASIX) 20 MG tablet Take 20 mg by mouth daily as needed for fluid. 12/07/21   [provider]  HYDROcodone-acetaminophen (NORCO/VICODIN) 5-325 MG tablet Take 1 tablet by mouth every 4 (four) hours as needed for severe pain. Patient not taking: Reported on 05/30/2022 05/03/22   Cassandria Anger, PA-C  Lutein 20 MG TABS Take 20 mg by mouth daily.    [provider]  LYCOPENE PO Take 20 mg by mouth daily.    [provider]  methocarbamol (ROBAXIN) 500 MG tablet Take 1 tablet (500 mg total) by mouth every 6 (six) hours as needed for muscle spasms. 05/03/22   Cassandria Anger, PA-C  Multiple Minerals (CALCIUM-MAGNESIUM-ZINC) TABS Take 1 tablet by mouth daily.  [provider]  polyethylene glycol (MIRALAX / GLYCOLAX) 17 g packet Take 17 g by mouth daily as needed for mild constipation. Patient not taking: Reported on 04/18/2022 05/18/21   Cassandria Angeronovan, Ashley R, PA-C  potassium chloride (KLOR-CON) 10 MEQ tablet Take 10-20 mEq by mouth daily. 12/15/21   [provider]  ranolazine (RANEXA) 500 MG 12 hr tablet Take 500 mg by mouth 2 (two) times daily. 04/30/22   [provider]  tadalafil (CIALIS) 5 MG tablet Take 5 mg by mouth daily as needed for erectile dysfunction. 02/02/22   [provider]  valACYclovir (VALTREX) 1000 MG tablet Take 1,000 mg by mouth daily as needed (fever blister). 04/11/21    [provider]  zinc gluconate 50 MG tablet Take 25 mg by mouth 2 (two) times daily.    [provider]      Allergies    Tape, Other, Doxycycline, and Horse epithelium    Review of Systems   Review of Systems  Neurological:  Positive for headaches.    Physical Exam Updated Vital Signs BP (!) 140/63 (BP Location: Right Arm)   Pulse 62   Temp 99.5 F (37.5 C)   Resp 18   Ht 5\' 8"  (1.727 m)   Wt 68 kg   SpO2 97%   BMI 22.81 kg/m  Physical Exam Vitals and nursing note reviewed.  Constitutional:      General: He is not in acute distress.    Appearance: He is well-developed.  HENT:     Head: Normocephalic and atraumatic.     Mouth/Throat:     Mouth: Mucous membranes are moist.     Pharynx: Oropharynx is clear.  Eyes:     Extraocular Movements: Extraocular movements intact.     Pupils: Pupils are equal, round, and reactive to light.  Neck:     Comments: No midline neck tenderness Mild right-sided proximal cervical paraspinal muscle tenderness to palpation, no overlying skin changes Cardiovascular:     Rate and Rhythm: Normal rate and regular rhythm.  Pulmonary:     Effort: Pulmonary effort is normal.     Breath sounds: Normal breath sounds.  Abdominal:     General: Bowel sounds are normal.     Palpations: Abdomen is soft.  Musculoskeletal:        General: Normal range of motion.     Cervical back: Normal range of motion and neck supple.     Comments: No midline back tenderness No tenderness to palpation of bilateral upper or lower extremities.  Skin:    General: Skin is warm and dry.  Neurological:     Mental Status: He is alert and oriented to person, place, and time.     GCS: GCS eye subscore is 4. GCS verbal subscore is 5. GCS motor subscore is 6.     Cranial Nerves: No cranial nerve deficit, dysarthria or facial asymmetry.     Sensory: No sensory deficit.     Motor: No weakness.  Psychiatric:        Mood and Affect: Mood normal.         Behavior: Behavior normal.     ED Results / Procedures / Treatments   Labs (all labs ordered are listed, but only abnormal results are displayed) Labs Reviewed - No data to display  EKG None  Radiology CT Cervical Spine Wo Contrast  Result Date: 06/05/2022 CLINICAL DATA:  Trauma fall EXAM: CT CERVICAL SPINE WITHOUT CONTRAST TECHNIQUE: Multidetector CT imaging of the  cervical spine was performed without intravenous contrast. Multiplanar CT image reconstructions were also generated. RADIATION DOSE REDUCTION: This exam was performed according to the departmental dose-optimization program which includes automated exposure control, adjustment of the mA and/or kV according to patient size and/or use of iterative reconstruction technique. COMPARISON:  None Available. FINDINGS: Alignment: Mild reversal of cervical lordosis. Trace anterolisthesis C7 on T1. Facet alignment is within normal limits. Skull base and vertebrae: No acute fracture. No primary bone lesion or focal pathologic process. Soft tissues and spinal canal: No prevertebral fluid or swelling. No visible canal hematoma. Disc levels: Multilevel degenerative change. Ankylosis C5-C6. Severe degenerative change C4-C5 and C6-C7. Facet degenerative change at multiple levels with fusion of the left facets C4 through C6. Upper chest: Negative. Other: None IMPRESSION: Reversal of cervical lordosis with trace anterolisthesis C7 on T1, likely degenerative. No acute osseous abnormality. Advanced multilevel degenerative change Electronically Signed   By: Jasmine Pang M.D.   On: 06/05/2022 18:05   CT Head Wo Contrast  Result Date: 06/05/2022 CLINICAL DATA:  Fall 3 days ago.  Head trauma with headache EXAM: CT HEAD WITHOUT CONTRAST CT CERVICAL SPINE WITHOUT CONTRAST TECHNIQUE: Multidetector CT imaging of the head and cervical spine was performed following the standard protocol without intravenous contrast. Multiplanar CT image reconstructions of the  cervical spine were also generated. RADIATION DOSE REDUCTION: This exam was performed according to the departmental dose-optimization program which includes automated exposure control, adjustment of the mA and/or kV according to patient size and/or use of iterative reconstruction technique. COMPARISON:  CT head 04/16/2022 FINDINGS: CT HEAD FINDINGS Brain: Generalized atrophy. Mild white matter hypodensity. Small chronic infarct right parietal lobe. Negative for acute infarct, hemorrhage, mass Vascular: Negative for hyperdense vessel Skull: Negative Sinuses/Orbits: Paranasal sinuses clear. Bilateral cataract extraction Other: None CT CERVICAL SPINE FINDINGS Alignment: 3 mm anterolisthesis C7-T1 otherwise normal alignment. Mild scoliosis. Skull base and vertebrae: Negative for fracture Soft tissues and spinal canal: No soft tissue mass or edema. Atherosclerotic calcification carotid artery bilaterally Disc levels: Cervical spondylosis. Disc degeneration and facet degeneration throughout the cervical spine. Mild spinal stenosis C5-6 and moderate spinal stenosis C6-7 Upper chest: Lung apices clear bilaterally Other: None IMPRESSION: No acute intracranial abnormality. Atrophy and chronic microvascular ischemia. Negative for cervical spine fracture. Cervical spondylosis. Electronically Signed   By: Marlan Palau M.D.   On: 06/05/2022 18:01    Procedures Procedures    Medications Ordered in ED Medications  lidocaine (LIDODERM) 5 % 1-3 patch (1 patch Transdermal Patch Applied 06/05/22 1819)  acetaminophen (TYLENOL) tablet 1,000 mg (1,000 mg Oral Given 06/05/22 1819)    ED Course/ Medical Decision Making/ A&P Clinical Course as of 06/05/22 1840  Wed Jun 05, 2022  1838 Patient CT imaging without acute traumatic injury.  He is stable for discharge home with primary care follow-up and was given strict return precautions. [VK]    Clinical Course User Index [VK] Rexford Maus, DO                            Medical Decision Making This patient presents to the ED with chief complaint(s) of neck pain after a fall with pertinent past medical history of a fib on Eliquis which further complicates the presenting complaint. The complaint involves an extensive differential diagnosis and also carries with it a high risk of complications and morbidity.    The differential diagnosis includes ICH, mass effect, cervical fracture, patient has no neurologic  deficits making final cord injury unlikely, considering musculoskeletal pain  Additional history obtained: Additional history obtained from N/A Records reviewed N/A  ED Course and Reassessment: Upon patient's arrival is awake and alert in no acute distress with no focal neurologic deficits on exam.  Due to patient's age and history of blood thinners that is currently held, he will have a CT head and C-spine performed.  He was given Tylenol and lidocaine patch for pain.  He had no presyncopal symptoms making a syncopal fall unlikely.  He had no other signs of injury on exam.  Independent labs interpretation:  N/A  Independent visualization of imaging: - I independently visualized the following imaging with scope of interpretation limited to determining acute life threatening conditions related to emergency care: CTH/neck, which revealed no acute traumatic injury  Consultation: - Consulted or discussed management/test interpretation w/ external professional: N/A  Consideration for admission or further workup: Patient has no emergent conditions requiring admission or further work-up at this time and is stable for discharge home with primary care follow-up  Social Determinants of health: N/A    Risk OTC drugs. Prescription drug management.          Final Clinical Impression(s) / ED Diagnoses Final diagnoses:  Strain of neck muscle, initial encounter    Rx / DC Orders ED Discharge Orders          Ordered    lidocaine (LIDODERM) 5 %   Every 24 hours        06/05/22 1838              Rexford Maus, DO 06/05/22 1840

## 2022-06-05 NOTE — Discharge Instructions (Signed)
You were seen in the emergency department for your neck pain after your fall.  Your CAT scan showed no signs of broken bones or bleeding within your brain.  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  You can continue to take Tylenol as needed for pain up to every 6 hours and use lidocaine patches, ice or heat.  You should try to stretch your muscles to prevent them from getting more stiff.  You should follow-up with your primary doctor in the next few days to have your symptoms rechecked.  You should return to the emergency department for significantly worsening pain, new numbness or weakness in your arms or legs or if you have any other new or concerning symptoms.

## 2022-06-05 NOTE — ED Triage Notes (Signed)
Pt complains of right sided head and neck pain after falling 3 days ago. denies hitting head denies injury head and neck hurt worse when turning to the left.. Stopped blood thinner 7 days ago due to upcoming surgery.

## 2022-06-06 ENCOUNTER — Other Ambulatory Visit: Payer: Self-pay

## 2022-06-06 ENCOUNTER — Encounter (HOSPITAL_COMMUNITY)
Admission: RE | Disposition: A | Discharge status: Skilled Nursing Facility | Payer: Self-pay | Source: Home / Self Care | Attending: Orthopedic Surgery

## 2022-06-06 ENCOUNTER — Inpatient Hospital Stay: Payer: Self-pay

## 2022-06-06 ENCOUNTER — Encounter (HOSPITAL_COMMUNITY): Payer: Self-pay | Admitting: Orthopedic Surgery

## 2022-06-06 ENCOUNTER — Inpatient Hospital Stay (HOSPITAL_COMMUNITY)
Admission: RE | Admit: 2022-06-06 | Discharge: 2022-06-12 | DRG: 467 | Disposition: A | Discharge status: Skilled Nursing Facility | Payer: Medicare PPO | Attending: Orthopedic Surgery | Admitting: Orthopedic Surgery

## 2022-06-06 ENCOUNTER — Inpatient Hospital Stay (HOSPITAL_COMMUNITY): Payer: Medicare PPO | Admitting: Registered Nurse

## 2022-06-06 ENCOUNTER — Inpatient Hospital Stay (HOSPITAL_COMMUNITY): Payer: Medicare PPO | Admitting: Physician Assistant

## 2022-06-06 ENCOUNTER — Inpatient Hospital Stay (HOSPITAL_COMMUNITY): Payer: Medicare PPO

## 2022-06-06 DIAGNOSIS — L7634 Postprocedural seroma of skin and subcutaneous tissue following other procedure: Principal | ICD-10-CM

## 2022-06-06 DIAGNOSIS — I10 Essential (primary) hypertension: Secondary | ICD-10-CM | POA: Diagnosis present

## 2022-06-06 DIAGNOSIS — Z91048 Other nonmedicinal substance allergy status: Secondary | ICD-10-CM

## 2022-06-06 DIAGNOSIS — W101XXA Fall (on)(from) sidewalk curb, initial encounter: Secondary | ICD-10-CM | POA: Diagnosis present

## 2022-06-06 DIAGNOSIS — S161XXA Strain of muscle, fascia and tendon at neck level, initial encounter: Secondary | ICD-10-CM | POA: Diagnosis present

## 2022-06-06 DIAGNOSIS — Y831 Surgical operation with implant of artificial internal device as the cause of abnormal reaction of the patient, or of later complication, without mention of misadventure at the time of the procedure: Secondary | ICD-10-CM | POA: Diagnosis present

## 2022-06-06 DIAGNOSIS — Z9841 Cataract extraction status, right eye: Secondary | ICD-10-CM

## 2022-06-06 DIAGNOSIS — Z888 Allergy status to other drugs, medicaments and biological substances status: Secondary | ICD-10-CM | POA: Diagnosis not present

## 2022-06-06 DIAGNOSIS — E785 Hyperlipidemia, unspecified: Secondary | ICD-10-CM | POA: Diagnosis present

## 2022-06-06 DIAGNOSIS — I480 Paroxysmal atrial fibrillation: Secondary | ICD-10-CM | POA: Diagnosis present

## 2022-06-06 DIAGNOSIS — Z79899 Other long term (current) drug therapy: Secondary | ICD-10-CM | POA: Diagnosis not present

## 2022-06-06 DIAGNOSIS — D62 Acute posthemorrhagic anemia: Secondary | ICD-10-CM | POA: Diagnosis not present

## 2022-06-06 DIAGNOSIS — I341 Nonrheumatic mitral (valve) prolapse: Secondary | ICD-10-CM | POA: Diagnosis not present

## 2022-06-06 DIAGNOSIS — Z8249 Family history of ischemic heart disease and other diseases of the circulatory system: Secondary | ICD-10-CM

## 2022-06-06 DIAGNOSIS — Y929 Unspecified place or not applicable: Secondary | ICD-10-CM

## 2022-06-06 DIAGNOSIS — N400 Enlarged prostate without lower urinary tract symptoms: Secondary | ICD-10-CM | POA: Diagnosis present

## 2022-06-06 DIAGNOSIS — T8489XA Other specified complication of internal orthopedic prosthetic devices, implants and grafts, initial encounter: Secondary | ICD-10-CM

## 2022-06-06 DIAGNOSIS — Z7901 Long term (current) use of anticoagulants: Secondary | ICD-10-CM

## 2022-06-06 DIAGNOSIS — T8451XD Infection and inflammatory reaction due to internal right hip prosthesis, subsequent encounter: Secondary | ICD-10-CM | POA: Diagnosis not present

## 2022-06-06 DIAGNOSIS — Z9842 Cataract extraction status, left eye: Secondary | ICD-10-CM | POA: Diagnosis not present

## 2022-06-06 DIAGNOSIS — T8451XA Infection and inflammatory reaction due to internal right hip prosthesis, initial encounter: Secondary | ICD-10-CM | POA: Diagnosis present

## 2022-06-06 DIAGNOSIS — I4891 Unspecified atrial fibrillation: Secondary | ICD-10-CM

## 2022-06-06 DIAGNOSIS — Z981 Arthrodesis status: Secondary | ICD-10-CM

## 2022-06-06 DIAGNOSIS — S7001XD Contusion of right hip, subsequent encounter: Secondary | ICD-10-CM

## 2022-06-06 DIAGNOSIS — I340 Nonrheumatic mitral (valve) insufficiency: Secondary | ICD-10-CM | POA: Diagnosis present

## 2022-06-06 HISTORY — PX: EXCISIONAL TOTAL HIP ARTHROPLASTY WITH ANTIBIOTIC SPACERS: SHX5826

## 2022-06-06 LAB — POCT I-STAT EG7
Acid-base deficit: 3 mmol/L — ABNORMAL HIGH (ref 0.0–2.0)
Bicarbonate: 22.7 mmol/L (ref 20.0–28.0)
Calcium, Ion: 1.18 mmol/L (ref 1.15–1.40)
HCT: 25 % — ABNORMAL LOW (ref 39.0–52.0)
Hemoglobin: 8.5 g/dL — ABNORMAL LOW (ref 13.0–17.0)
O2 Saturation: 91 %
Potassium: 4.5 mmol/L (ref 3.5–5.1)
Sodium: 140 mmol/L (ref 135–145)
TCO2: 24 mmol/L (ref 22–32)
pCO2, Ven: 39.9 mmHg — ABNORMAL LOW (ref 44–60)
pH, Ven: 7.363 (ref 7.25–7.43)
pO2, Ven: 63 mmHg — ABNORMAL HIGH (ref 32–45)

## 2022-06-06 LAB — PREPARE RBC (CROSSMATCH)

## 2022-06-06 LAB — ABO/RH: ABO/RH(D): A POS

## 2022-06-06 SURGERY — EXCISIONAL TOTAL HIP ARTHROPLASTY WITH ANTIBIOTIC SPACERS
Anesthesia: General | Site: Hip | Laterality: Right

## 2022-06-06 MED ORDER — FENTANYL CITRATE PF 50 MCG/ML IJ SOSY
PREFILLED_SYRINGE | INTRAMUSCULAR | Status: AC
  Filled 2022-06-06: qty 1

## 2022-06-06 MED ORDER — FENTANYL CITRATE PF 50 MCG/ML IJ SOSY
50.0000 ug | PREFILLED_SYRINGE | Freq: Once | INTRAMUSCULAR | Status: AC
  Administered 2022-06-06: 50 ug via INTRAVENOUS

## 2022-06-06 MED ORDER — TIMOLOL MALEATE 0.5 % OP SOLN
1.0000 [drp] | Freq: Two times a day (BID) | OPHTHALMIC | Status: DC
  Filled 2022-06-06: qty 5

## 2022-06-06 MED ORDER — ONDANSETRON HCL 4 MG/2ML IJ SOLN
4.0000 mg | Freq: Four times a day (QID) | INTRAMUSCULAR | Status: DC | PRN
  Administered 2022-06-10: 4 mg via INTRAVENOUS
  Filled 2022-06-06: qty 2

## 2022-06-06 MED ORDER — HYDROMORPHONE HCL 1 MG/ML IJ SOLN
0.2500 mg | INTRAMUSCULAR | Status: DC | PRN
  Administered 2022-06-06 (×2): 0.5 mg via INTRAVENOUS

## 2022-06-06 MED ORDER — PHENYLEPHRINE 80 MCG/ML (10ML) SYRINGE FOR IV PUSH (FOR BLOOD PRESSURE SUPPORT)
PREFILLED_SYRINGE | INTRAVENOUS | Status: AC
  Filled 2022-06-06: qty 10

## 2022-06-06 MED ORDER — MORPHINE SULFATE (PF) 2 MG/ML IV SOLN
0.5000 mg | INTRAVENOUS | Status: DC | PRN
  Administered 2022-06-06 – 2022-06-10 (×3): 1 mg via INTRAVENOUS
  Filled 2022-06-06 (×3): qty 1

## 2022-06-06 MED ORDER — POTASSIUM CHLORIDE ER 10 MEQ PO TBCR: 20.0000 meq | EXTENDED_RELEASE_TABLET | Freq: Every day | ORAL | Status: DC | PRN

## 2022-06-06 MED ORDER — ROCURONIUM BROMIDE 10 MG/ML (PF) SYRINGE
PREFILLED_SYRINGE | INTRAVENOUS | Status: AC
  Filled 2022-06-06: qty 10

## 2022-06-06 MED ORDER — HYDROCODONE-ACETAMINOPHEN 7.5-325 MG PO TABS
1.0000 | ORAL_TABLET | ORAL | Status: DC | PRN
  Administered 2022-06-06 – 2022-06-12 (×20): 2 via ORAL
  Filled 2022-06-06 (×22): qty 2

## 2022-06-06 MED ORDER — FENTANYL CITRATE (PF) 100 MCG/2ML IJ SOLN
INTRAMUSCULAR | Status: AC
  Filled 2022-06-06: qty 2

## 2022-06-06 MED ORDER — DOCUSATE SODIUM 100 MG PO CAPS
100.0000 mg | ORAL_CAPSULE | Freq: Two times a day (BID) | ORAL | Status: DC
  Administered 2022-06-06 – 2022-06-12 (×9): 100 mg via ORAL
  Filled 2022-06-06 (×11): qty 1

## 2022-06-06 MED ORDER — PHENYLEPHRINE HCL-NACL 20-0.9 MG/250ML-% IV SOLN
INTRAVENOUS | Status: DC | PRN
  Administered 2022-06-06: 20 ug/min via INTRAVENOUS

## 2022-06-06 MED ORDER — CHLORHEXIDINE GLUCONATE 0.12 % MT SOLN
15.0000 mL | Freq: Once | OROMUCOSAL | Status: AC
  Administered 2022-06-06: 15 mL via OROMUCOSAL

## 2022-06-06 MED ORDER — PHENYLEPHRINE 80 MCG/ML (10ML) SYRINGE FOR IV PUSH (FOR BLOOD PRESSURE SUPPORT)
PREFILLED_SYRINGE | INTRAVENOUS | Status: DC | PRN
  Administered 2022-06-06: 160 ug via INTRAVENOUS

## 2022-06-06 MED ORDER — SODIUM CHLORIDE 0.9 % IV SOLN: INTRAVENOUS | Status: DC

## 2022-06-06 MED ORDER — SODIUM CHLORIDE 0.9 % IV SOLN: INTRAVENOUS | Status: DC | PRN

## 2022-06-06 MED ORDER — LIDOCAINE HCL (PF) 2 % IJ SOLN
INTRAMUSCULAR | Status: AC
  Filled 2022-06-06: qty 5

## 2022-06-06 MED ORDER — ONDANSETRON HCL 4 MG PO TABS: 4.0000 mg | ORAL_TABLET | Freq: Four times a day (QID) | ORAL | Status: DC | PRN

## 2022-06-06 MED ORDER — LACTATED RINGERS IV SOLN: INTRAVENOUS | Status: DC

## 2022-06-06 MED ORDER — FUROSEMIDE 20 MG PO TABS: 20.0000 mg | ORAL_TABLET | Freq: Every day | ORAL | Status: DC | PRN

## 2022-06-06 MED ORDER — METHOCARBAMOL 500 MG IVPB - SIMPLE MED
500.0000 mg | Freq: Four times a day (QID) | INTRAVENOUS | Status: DC | PRN
  Administered 2022-06-06: 500 mg via INTRAVENOUS

## 2022-06-06 MED ORDER — CEFAZOLIN SODIUM-DEXTROSE 2-4 GM/100ML-% IV SOLN
2.0000 g | Freq: Four times a day (QID) | INTRAVENOUS | Status: AC
  Administered 2022-06-06 (×2): 2 g via INTRAVENOUS
  Filled 2022-06-06 (×2): qty 100

## 2022-06-06 MED ORDER — OXYCODONE HCL 5 MG PO TABS: 5.0000 mg | ORAL_TABLET | Freq: Once | ORAL | Status: DC | PRN

## 2022-06-06 MED ORDER — DEXAMETHASONE SODIUM PHOSPHATE 10 MG/ML IJ SOLN
10.0000 mg | Freq: Once | INTRAMUSCULAR | Status: AC
  Administered 2022-06-07: 10 mg via INTRAVENOUS
  Filled 2022-06-06: qty 1

## 2022-06-06 MED ORDER — PROPOFOL 10 MG/ML IV BOLUS
INTRAVENOUS | Status: DC | PRN
  Administered 2022-06-06: 100 mg via INTRAVENOUS
  Administered 2022-06-06: 30 mg via INTRAVENOUS

## 2022-06-06 MED ORDER — APIXABAN 2.5 MG PO TABS
2.5000 mg | ORAL_TABLET | Freq: Every day | ORAL | Status: DC
  Administered 2022-06-07: 2.5 mg via ORAL
  Filled 2022-06-06: qty 1

## 2022-06-06 MED ORDER — METHOCARBAMOL 500 MG IVPB - SIMPLE MED
INTRAVENOUS | Status: AC
  Filled 2022-06-06: qty 55

## 2022-06-06 MED ORDER — DEXAMETHASONE SODIUM PHOSPHATE 10 MG/ML IJ SOLN
8.0000 mg | Freq: Once | INTRAMUSCULAR | Status: AC
  Administered 2022-06-06: 5 mg via INTRAVENOUS

## 2022-06-06 MED ORDER — ALBUMIN HUMAN 5 % IV SOLN
INTRAVENOUS | Status: AC
  Filled 2022-06-06: qty 250

## 2022-06-06 MED ORDER — HYDROCODONE-ACETAMINOPHEN 5-325 MG PO TABS: 1.0000 | ORAL_TABLET | ORAL | Status: DC | PRN

## 2022-06-06 MED ORDER — DEXAMETHASONE SODIUM PHOSPHATE 10 MG/ML IJ SOLN
INTRAMUSCULAR | Status: AC
  Filled 2022-06-06: qty 1

## 2022-06-06 MED ORDER — ONDANSETRON HCL 4 MG/2ML IJ SOLN
INTRAMUSCULAR | Status: DC | PRN
  Administered 2022-06-06: 4 mg via INTRAVENOUS

## 2022-06-06 MED ORDER — HYDROMORPHONE HCL 1 MG/ML IJ SOLN
INTRAMUSCULAR | Status: AC
  Administered 2022-06-06: 0.5 mg via INTRAVENOUS
  Filled 2022-06-06: qty 1

## 2022-06-06 MED ORDER — ORAL CARE MOUTH RINSE: 15.0000 mL | Freq: Once | OROMUCOSAL | Status: AC

## 2022-06-06 MED ORDER — VANCOMYCIN HCL 1000 MG IV SOLR
INTRAVENOUS | Status: DC | PRN
  Administered 2022-06-06: 2000 mg

## 2022-06-06 MED ORDER — SODIUM CHLORIDE 0.9 % IR SOLN
Status: DC | PRN
  Administered 2022-06-06: 3000 mL

## 2022-06-06 MED ORDER — ONDANSETRON HCL 4 MG/2ML IJ SOLN
INTRAMUSCULAR | Status: AC
  Filled 2022-06-06: qty 2

## 2022-06-06 MED ORDER — PROPOFOL 10 MG/ML IV BOLUS
INTRAVENOUS | Status: AC
  Filled 2022-06-06: qty 20

## 2022-06-06 MED ORDER — MENTHOL 3 MG MT LOZG: 1.0000 | LOZENGE | OROMUCOSAL | Status: DC | PRN

## 2022-06-06 MED ORDER — POLYETHYLENE GLYCOL 3350 17 G PO PACK
17.0000 g | PACK | Freq: Two times a day (BID) | ORAL | Status: DC
  Administered 2022-06-08 – 2022-06-12 (×5): 17 g via ORAL
  Filled 2022-06-06 (×9): qty 1

## 2022-06-06 MED ORDER — LIDOCAINE 2% (20 MG/ML) 5 ML SYRINGE
INTRAMUSCULAR | Status: DC | PRN
  Administered 2022-06-06: 80 mg via INTRAVENOUS

## 2022-06-06 MED ORDER — ALBUMIN HUMAN 5 % IV SOLN: INTRAVENOUS | Status: DC | PRN

## 2022-06-06 MED ORDER — PHENOL 1.4 % MT LIQD: 1.0000 | OROMUCOSAL | Status: DC | PRN

## 2022-06-06 MED ORDER — FENTANYL CITRATE (PF) 100 MCG/2ML IJ SOLN
INTRAMUSCULAR | Status: DC | PRN
  Administered 2022-06-06: 25 ug via INTRAVENOUS
  Administered 2022-06-06: 50 ug via INTRAVENOUS
  Administered 2022-06-06 (×2): 25 ug via INTRAVENOUS

## 2022-06-06 MED ORDER — TRANEXAMIC ACID-NACL 1000-0.7 MG/100ML-% IV SOLN
1000.0000 mg | Freq: Once | INTRAVENOUS | Status: AC
  Administered 2022-06-06: 1000 mg via INTRAVENOUS
  Filled 2022-06-06: qty 100

## 2022-06-06 MED ORDER — DORZOLAMIDE HCL 2 % OP SOLN
1.0000 [drp] | Freq: Two times a day (BID) | OPHTHALMIC | Status: DC
  Filled 2022-06-06: qty 10

## 2022-06-06 MED ORDER — METHOCARBAMOL 500 MG PO TABS
500.0000 mg | ORAL_TABLET | Freq: Four times a day (QID) | ORAL | Status: DC | PRN
  Administered 2022-06-07 – 2022-06-12 (×13): 500 mg via ORAL
  Filled 2022-06-06 (×13): qty 1

## 2022-06-06 MED ORDER — CEFAZOLIN SODIUM-DEXTROSE 2-4 GM/100ML-% IV SOLN
2.0000 g | INTRAVENOUS | Status: AC
  Administered 2022-06-06: 2 g via INTRAVENOUS
  Filled 2022-06-06: qty 100

## 2022-06-06 MED ORDER — RANOLAZINE ER 500 MG PO TB12
500.0000 mg | ORAL_TABLET | Freq: Two times a day (BID) | ORAL | Status: DC
  Administered 2022-06-07 – 2022-06-12 (×11): 500 mg via ORAL
  Filled 2022-06-06 (×12): qty 1

## 2022-06-06 MED ORDER — TRANEXAMIC ACID-NACL 1000-0.7 MG/100ML-% IV SOLN
1000.0000 mg | INTRAVENOUS | Status: AC
  Administered 2022-06-06: 1000 mg via INTRAVENOUS
  Filled 2022-06-06: qty 100

## 2022-06-06 MED ORDER — SUGAMMADEX SODIUM 200 MG/2ML IV SOLN
INTRAVENOUS | Status: DC | PRN
  Administered 2022-06-06: 150 mg via INTRAVENOUS

## 2022-06-06 MED ORDER — METOCLOPRAMIDE HCL 5 MG PO TABS: 5.0000 mg | ORAL_TABLET | Freq: Three times a day (TID) | ORAL | Status: DC | PRN

## 2022-06-06 MED ORDER — SODIUM CHLORIDE 0.9% IV SOLUTION: Freq: Once | INTRAVENOUS | Status: DC

## 2022-06-06 MED ORDER — LACTATED RINGERS IV SOLN: INTRAVENOUS | Status: DC | PRN

## 2022-06-06 MED ORDER — OXYCODONE HCL 5 MG/5ML PO SOLN: 5.0000 mg | Freq: Once | ORAL | Status: DC | PRN

## 2022-06-06 MED ORDER — ACETAMINOPHEN 325 MG PO TABS
325.0000 mg | ORAL_TABLET | Freq: Four times a day (QID) | ORAL | Status: DC | PRN
  Administered 2022-06-09: 650 mg via ORAL
  Filled 2022-06-06: qty 2

## 2022-06-06 MED ORDER — METOCLOPRAMIDE HCL 5 MG/ML IJ SOLN: 5.0000 mg | Freq: Three times a day (TID) | INTRAMUSCULAR | Status: DC | PRN

## 2022-06-06 MED ORDER — BISACODYL 10 MG RE SUPP: 10.0000 mg | Freq: Every day | RECTAL | Status: DC | PRN

## 2022-06-06 MED ORDER — DIPHENHYDRAMINE HCL 12.5 MG/5ML PO ELIX: 12.5000 mg | ORAL_SOLUTION | ORAL | Status: DC | PRN

## 2022-06-06 MED ORDER — ZOLPIDEM TARTRATE 5 MG PO TABS
5.0000 mg | ORAL_TABLET | Freq: Every evening | ORAL | Status: DC | PRN
  Administered 2022-06-06 – 2022-06-11 (×5): 5 mg via ORAL
  Filled 2022-06-06 (×6): qty 1

## 2022-06-06 MED ORDER — TOBRAMYCIN SULFATE 1.2 G IJ SOLR
INTRAMUSCULAR | Status: DC | PRN
  Administered 2022-06-06: 2.4 g

## 2022-06-06 MED ORDER — ONDANSETRON HCL 4 MG/2ML IJ SOLN: INTRAMUSCULAR | Status: DC | PRN

## 2022-06-06 MED ORDER — ROCURONIUM BROMIDE 10 MG/ML (PF) SYRINGE
PREFILLED_SYRINGE | INTRAVENOUS | Status: DC | PRN
  Administered 2022-06-06: 10 mg via INTRAVENOUS
  Administered 2022-06-06: 70 mg via INTRAVENOUS

## 2022-06-06 MED ORDER — ONDANSETRON HCL 4 MG/2ML IJ SOLN: 4.0000 mg | Freq: Once | INTRAMUSCULAR | Status: DC | PRN

## 2022-06-06 MED ORDER — DORZOLAMIDE HCL-TIMOLOL MAL 2-0.5 % OP SOLN
1.0000 [drp] | Freq: Two times a day (BID) | OPHTHALMIC | Status: DC
  Filled 2022-06-06: qty 10

## 2022-06-06 SURGICAL SUPPLY — 64 items
BAG COUNTER SPONGE SURGICOUNT (BAG) IMPLANT
BAG ZIPLOCK 12X15 (MISCELLANEOUS) ×1 IMPLANT
BIT DRILL Q/COUPLING 1 (BIT) IMPLANT
BLADE SAGITTAL 25.0X1.37X90 (BLADE) IMPLANT
BLADE SAW SGTL 11.0X1.19X90.0M (BLADE) IMPLANT
BLADE SAW SGTL 18X1.27X75 (BLADE) ×1 IMPLANT
BRUSH FEMORAL CANAL (MISCELLANEOUS) ×1 IMPLANT
BUR CRS CUT 2.1 (BURR) IMPLANT
CEMENT HV SMART SET (Cement) ×2 IMPLANT
COVER SURGICAL LIGHT HANDLE (MISCELLANEOUS) ×1 IMPLANT
DERMABOND ADVANCED .7 DNX12 (GAUZE/BANDAGES/DRESSINGS) IMPLANT
DRAPE ORTHO SPLIT 77X108 STRL (DRAPES) ×2
DRAPE POUCH INSTRU U-SHP 10X18 (DRAPES) ×1 IMPLANT
DRAPE SURG 17X11 SM STRL (DRAPES) ×1 IMPLANT
DRAPE SURG ORHT 6 SPLT 77X108 (DRAPES) ×2 IMPLANT
DRAPE U-SHAPE 47X51 STRL (DRAPES) ×1 IMPLANT
DRESSING AQUACEL AG SP 3.5X10 (GAUZE/BANDAGES/DRESSINGS) IMPLANT
DRESSING MEPILEX FLEX 4X4 (GAUZE/BANDAGES/DRESSINGS) ×1 IMPLANT
DRSG AQUACEL AG ADV 3.5X10 (GAUZE/BANDAGES/DRESSINGS) IMPLANT
DRSG AQUACEL AG ADV 3.5X14 (GAUZE/BANDAGES/DRESSINGS) IMPLANT
DRSG AQUACEL AG SP 3.5X10 (GAUZE/BANDAGES/DRESSINGS)
DRSG EMULSION OIL 3X16 NADH (GAUZE/BANDAGES/DRESSINGS) ×1 IMPLANT
DRSG MEPILEX FLEX 4X4 (GAUZE/BANDAGES/DRESSINGS) ×1
DRSG MEPILEX POST OP 4X8 (GAUZE/BANDAGES/DRESSINGS) ×1 IMPLANT
DURAPREP 26ML APPLICATOR (WOUND CARE) ×1 IMPLANT
ELECT BLADE TIP CTD 4 INCH (ELECTRODE) ×1 IMPLANT
ELECT REM PT RETURN 15FT ADLT (MISCELLANEOUS) ×1 IMPLANT
EVACUATOR 1/8 PVC DRAIN (DRAIN) IMPLANT
FACESHIELD WRAPAROUND (MASK) ×4 IMPLANT
FACESHIELD WRAPAROUND OR TEAM (MASK) ×4 IMPLANT
GLOVE BIOGEL M 7.0 STRL (GLOVE) IMPLANT
GLOVE BIOGEL PI IND STRL 7.5 (GLOVE) ×3 IMPLANT
GLOVE BIOGEL PI IND STRL 8.5 (GLOVE) ×1 IMPLANT
GLOVE INDICATOR 6.5 STRL GRN (GLOVE) ×1 IMPLANT
GLOVE SURG ORTHO 8.0 STRL STRW (GLOVE) ×1 IMPLANT
GOWN STRL REUS W/ TWL LRG LVL3 (GOWN DISPOSABLE) ×2 IMPLANT
GOWN STRL REUS W/TWL LRG LVL3 (GOWN DISPOSABLE) ×2
HANDPIECE INTERPULSE COAX TIP (DISPOSABLE) ×1
KIT TURNOVER KIT A (KITS) IMPLANT
MANIFOLD NEPTUNE II (INSTRUMENTS) ×1 IMPLANT
NS IRRIG 1000ML POUR BTL (IV SOLUTION) ×2 IMPLANT
PADDING CAST COTTON 6X4 STRL (CAST SUPPLIES) ×1 IMPLANT
PRESSURIZER FEMORAL UNIV (MISCELLANEOUS) ×1 IMPLANT
PROTECTOR NERVE ULNAR (MISCELLANEOUS) ×1 IMPLANT
SET HNDPC FAN SPRY TIP SCT (DISPOSABLE) ×1 IMPLANT
SLEEVE SUCTION CATH 165 (SLEEVE) IMPLANT
SPONGE T-LAP 18X18 ~~LOC~~+RFID (SPONGE) IMPLANT
SPONGE T-LAP 4X18 ~~LOC~~+RFID (SPONGE) ×1 IMPLANT
STAPLER VISISTAT 35W (STAPLE) ×1 IMPLANT
SUCTION FRAZIER HANDLE 10FR (MISCELLANEOUS) ×1
SUCTION TUBE FRAZIER 10FR DISP (MISCELLANEOUS) ×1 IMPLANT
SUT MNCRL 3 0 RB1 (SUTURE) IMPLANT
SUT MNCRL 3 0 VIOLET RB1 (SUTURE) IMPLANT
SUT MNCRL AB 3-0 PS2 18 (SUTURE) IMPLANT
SUT MONOCRYL 3 0 RB1 (SUTURE)
SUT STRATAFIX 0 PDS 27 VIOLET (SUTURE) ×2
SUT VIC AB 1 CT1 36 (SUTURE) ×3 IMPLANT
SUT VIC AB 2-0 CT1 27 (SUTURE) ×5
SUT VIC AB 2-0 CT1 TAPERPNT 27 (SUTURE) ×3 IMPLANT
SUTURE STRATFX 0 PDS 27 VIOLET (SUTURE) IMPLANT
TOWEL OR 17X26 10 PK STRL BLUE (TOWEL DISPOSABLE) ×2 IMPLANT
TOWER CARTRIDGE SMART MIX (DISPOSABLE) ×1 IMPLANT
TRAY FOLEY MTR SLVR 16FR STAT (SET/KITS/TRAYS/PACK) ×1 IMPLANT
WATER STERILE IRR 1000ML POUR (IV SOLUTION) ×1 IMPLANT

## 2022-06-06 NOTE — Progress Notes (Signed)
Pt has arrived to 1343 from PACU s/p Right excsional THA.  Report accepted from [facility].  Pt is alert and oriented.  Wife notified of arrival. Room orientation completed with call bell placed at bedside.  Care continues.

## 2022-06-06 NOTE — H&P (Signed)
TOTAL HIP REVISION ADMISSION H&P  Patient is admitted for right total hip resection with placement of antibiotic spacer   Subjective:  Chief Complaint: Right hip prosthetic joint infection   HPI: Travis Wolfe, 87 y.o. male, has a complex surgical history involving his right hip. He has a remote history of cemented right total hip arthroplasty, which failed and was converted to press fit total hip with metal on metal components. 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 He has has multiple procedures now to attempt to resolve his persistent lateral hip seroma, including most recently an I&D with placement of wound vac on 05/02/22. He was on cefadroxil during this period. He developed signs of infection during this time, and determination was made to undergo resection of the hip implants with two stage revision.      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 AKBARY AS PRECAUTION  ; has been on eliquis since but reports he has not taken it for 5 weeks b/c he was told to hold it for the multiple irrigations and debridements for the hip infection.  reports his cardiologist is aware he has been holding it;    Arthritis    Atherosclerosis of both carotid arteries    BPH (benign prostatic hyperplasia)    Dysrhythmia    a-fib   Fall    fell on friday 01-16-18;   lost balAnce whIile tryng to get his sock on ;sustained skin tear to left wrist ; no drainage  , scabbed over , and has been covering with bandage    Heart murmur    SINCE HIS 20s   HLD (hyperlipidemia)    Hypertension    Non-rheumatic mitral regurgitation     Past Surgical History:  Procedure Laterality Date   BACK SURGERY  2007   L1-L5 LUMBAR WITH HARDWARE PLACED   CARDIOVERSION     CATARACT EXTRACTION, BILATERAL  2015   CRYOABLATION  2023   CARDIAC ABLATION and 2019   DACROCYSTORHINOSTOMY  07/26/2011   HEMATOMA EVACUATION Right 01/31/2022   Procedure: Evacuation of right hip hematoma;  Surgeon: Durene Romanslin, Matthew, MD;  Location: [facility];  Service: Orthopedics;  Laterality: Right;  90 mins   HEMATOMA EVACUATION Right 05/02/2022   Procedure: Evacuation of right hip Seroma;  Surgeon: Durene Romanslin, Matthew, MD;  Location: [facility];  Service: Orthopedics;  Laterality: Right;   HERNIA REPAIR Bilateral    hx of echocardiogram      INCISION AND DRAINAGE HIP Right 01/26/2018   Procedure: Right hip irrigation and debridement, excisional and non excisional debridement, head liner exchange, posterior approach;  Surgeon: Durene Romans, MD;  Location: [facility];  Service: Orthopedics;  Laterality: Right;  90 mins Would like to start earlier around 2:00pm if time opens   INCISION AND DRAINAGE HIP Right 11/26/2018   Procedure: IRRIGATION AND DEBRIDEMENT HIP;  Surgeon: Durene Romans, MD;  Location: [facility];  Service: Orthopedics;  Laterality: Right;  90 mins   INCISION AND DRAINAGE HIP Right 05/17/2021   Procedure: IRRIGATION AND DEBRIDEMENT RIGHT HIP, EVACUATION OF PSEUDOTUMOR;  Surgeon: Durene Romans, MD;  Location: [facility];  Service: Orthopedics;  Laterality: Right;   INGUINAL HERNIA REPAIR Bilateral     with mesh    IR US GUIDE BX ASP/DRAIN  12/17/2017   IRRIGATION AND ASPIRATION RIGHT HIP   12/10/2017   [facility]  x2,  [facility] x1    KIDNEY STONE SURGERY     lithotripsy    KYPHOPLASTY     T7   LATERAL FUSION LUMBAR SPINE     L1-L5   PULMONARY VEIN ISOLATION AND LEFT ATRIAL ROOFLINE ABLATION   10/15/2017   DR Rudolpho Sevin    ROTATOR CUFF REPAIR Left    SHOULDER DEBRIDEMENT Right    SHOULDER OPEN ROTATOR CUFF REPAIR Left    SHOULDER SURGERY  1988   arthroscopy     TONSILLECTOMY     TOTAL HIP ARTHROPLASTY Right 2003   TOTAL HIP ARTHROPLASTY  2010   TOTAL HIP REVISION      05-2017, 10-2016    No current facility-administered medications for this encounter.   Current Outpatient Medications  Medication Sig Dispense Refill Last Dose   acetaminophen (TYLENOL) 325 MG suppository Place 325 mg rectally every 4 (four) hours as needed for moderate pain.      apixaban (ELIQUIS) 5 MG TABS tablet Take 2.5 mg by mouth daily.      Biotin 5 MG CAPS Take 5 mg by mouth daily.      cefadroxil (DURICEF) 500 MG capsule Take 1,000 mg by mouth daily.      Cholecalciferol (VITAMIN D) 50 MCG (2000 UT) tablet Take 2,000 Units by mouth daily.      COSOPT PF 22.3-6.8 MG/ML SOLN ophthalmic solution Place 1 drop into both eyes in the morning and at bedtime.  3    docusate sodium (COLACE) 100 MG capsule Take 1 capsule (100 mg total) by mouth 2 (two) times daily. (Patient taking differently: Take 100 mg by mouth daily as needed.) 10 capsule 0    Eszopiclone 3 MG TABS Take 3 mg by mouth at bedtime.  1    Ferrous Sulfate (SLOW RELEASE IRON PO) Take 1 tablet by mouth daily.      furosemide (LASIX) 20 MG tablet Take 20 mg by mouth daily as needed for fluid.      Lutein 20 MG TABS Take 20 mg by mouth daily.      LYCOPENE PO Take 20 mg by mouth daily.      methocarbamol (ROBAXIN) 500 MG tablet Take 1 tablet (500 mg total) by mouth every 6 (six) hours as needed for muscle spasms. 40 tablet 0    Multiple Minerals  (CALCIUM-MAGNESIUM-ZINC) TABS Take 1 tablet by mouth daily.      potassium chloride (KLOR-CON) 10 MEQ tablet Take 10-20 mEq by mouth daily.      ranolazine (RANEXA) 500 MG 12 hr tablet Take 500 mg by mouth 2 (two) times daily.      tadalafil (CIALIS) 5 MG tablet Take 5 mg by mouth daily as needed for erectile dysfunction.  valACYclovir (VALTREX) 1000 MG tablet Take 1,000 mg by mouth daily as needed (fever blister).      zinc gluconate 50 MG tablet Take 25 mg by mouth 2 (two) times daily.      HYDROcodone-acetaminophen (NORCO/VICODIN) 5-325 MG tablet Take 1 tablet by mouth every 4 (four) hours as needed for severe pain. (Patient not taking: Reported on 05/30/2022) 30 tablet 0 Not Taking   lidocaine (LIDODERM) 5 % Place 1 patch onto the skin daily. Remove & Discard patch within 12 hours or as directed by MD 30 patch 0    polyethylene glycol (MIRALAX / GLYCOLAX) 17 g packet Take 17 g by mouth daily as needed for mild constipation. (Patient not taking: Reported on 04/18/2022) 14 each 0    Allergies  Allergen Reactions   Tape Dermatitis and Rash    ONLY use Paper tape   Other     Cat dander   Doxycycline Rash   Horse Epithelium Rash    Social History   Tobacco Use   Smoking status: Never   Smokeless tobacco: Never  Substance Use Topics   Alcohol use: Yes    Alcohol/week: 3.0 standard drinks of alcohol    Types: 3 Standard drinks or equivalent per week    Comment: socially     Family History  Problem Relation Age of Onset   Hypertension Mother    Cancer Mother       Review of Systems  Constitutional:  Negative for chills and fever.  Respiratory:  Negative for cough and shortness of breath.   Cardiovascular:  Negative for chest pain.  Gastrointestinal:  Negative for nausea and vomiting.  Musculoskeletal:  Positive for arthralgias.     Objective:  Physical Exam Constitutional:      Appearance: Normal appearance.  HENT:     Head: Normocephalic.  Pulmonary:     Effort:  Pulmonary effort is normal.  Neurological:     Mental Status: He is alert.    Right Hip:  Large open wound with wet/dry dressing in place Serosanguinous drainage with granular tissue No surrounding erythema or warmth   Vital signs in last 24 hours: Temp:  [99.5 F (37.5 C)] 99.5 F (37.5 C) (12/20 1549) Pulse Rate:  [62-70] 62 (12/20 1725) Resp:  [18-20] 18 (12/20 1725) BP: (140-141)/(63) 140/63 (12/20 1725) SpO2:  [97 %-100 %] 97 % (12/20 1725) Weight:  [68 kg] 68 kg (12/20 1553)   Labs:   Estimated body mass index is 22.81 kg/m as calculated from the following:   Height as of 06/05/22: 5\' 8"  (1.727 m).   Weight as of 06/05/22: 68 kg.  Imaging Review:  Plain radiographs demonstrate revision hip implants in stable positioning.     Assessment/Plan:  Prosthetic joint infection, right total hip arthroplasty   The patient history, physical examination, clinical judgement of the provider and imaging studies are consistent with Prosthetic joint infection of the right hip(s), previous total hip arthroplasty. Revision total hip arthroplasty is deemed medically necessary. The treatment options including medical management, injection therapy, arthroscopy and arthroplasty were discussed at length. The risks and benefits of total hip arthroplasty were presented and reviewed. The risks due to aseptic loosening, infection, stiffness, dislocation/subluxation,  thromboembolic complications and other imponderables were discussed.  The patient acknowledged the explanation, agreed to proceed with the plan and consent was signed. Patient is being admitted for inpatient treatment for surgery, pain control, PT, OT, prophylactic antibiotics, VTE prophylaxis, progressive ambulation and ADL's and discharge planning. The patient  is planning to be discharged  home.  Rosalene Billings, PA-C Orthopedic Surgery [facility] [place] [phone]

## 2022-06-06 NOTE — Transfer of Care (Signed)
Immediate Anesthesia Transfer of Care Note  Patient: Travis Wolfe  Procedure(s) Performed: EXCISIONAL TOTAL HIP ARTHROPLASTY WITH ANTIBIOTIC SPACERS (Right: Hip)  Patient Location: PACU  Anesthesia Type:General  Level of Consciousness: awake, alert , oriented, and patient cooperative  Airway & Oxygen Therapy: Patient Spontanous Breathing and Patient connected to face mask oxygen  Post-op Assessment: Report given to RN, Post -op Vital signs reviewed and stable, and Patient moving all extremities  Post vital signs: Reviewed and stable  Last Vitals:  Vitals Value Taken Time  BP 139/65 06/06/22 1330  Temp 36.5 C 06/06/22 1326  Pulse 66 06/06/22 1334  Resp 17 06/06/22 1334  SpO2 100 % 06/06/22 1334  Vitals shown include unvalidated device data.  Last Pain:  Vitals:   06/06/22 0846  TempSrc:   PainSc: 8          Complications: No notable events documented.

## 2022-06-06 NOTE — Anesthesia Procedure Notes (Signed)
Procedure Name: Intubation Date/Time: 06/06/2022 10:34 AM  Performed by: Victoriano Lain, CRNAPre-anesthesia Checklist: Patient identified, Emergency Drugs available, Suction available, Patient being monitored and Timeout performed Patient Re-evaluated:Patient Re-evaluated prior to induction Oxygen Delivery Method: Circle system utilized Preoxygenation: Pre-oxygenation with 100% oxygen Induction Type: IV induction Ventilation: Mask ventilation without difficulty Laryngoscope Size: Mac and 4 Grade View: Grade I Tube type: Oral Tube size: 7.5 mm Number of attempts: 1 Airway Equipment and Method: Stylet Placement Confirmation: ETT inserted through vocal cords under direct vision, positive ETCO2 and breath sounds checked- equal and bilateral Secured at: 22 cm Tube secured with: Tape Dental Injury: Teeth and Oropharynx as per pre-operative assessment

## 2022-06-06 NOTE — Anesthesia Preprocedure Evaluation (Addendum)
Anesthesia Evaluation  Patient identified by MRN, date of birth, ID band Patient awake    Reviewed: Allergy & Precautions, H&P , NPO status , Patient's Chart, lab work & pertinent test results  Airway Mallampati: II  TM Distance: >3 FB Neck ROM: Full    Dental no notable dental hx.    Pulmonary neg pulmonary ROS   Pulmonary exam normal breath sounds clear to auscultation       Cardiovascular hypertension, Normal cardiovascular exam+ dysrhythmias Atrial Fibrillation + Valvular Problems/Murmurs MR  Rhythm:Regular Rate:Normal     Neuro/Psych negative neurological ROS  negative psych ROS   GI/Hepatic negative GI ROS, Neg liver ROS,,,  Endo/Other  negative endocrine ROS    Renal/GU negative Renal ROS  negative genitourinary   Musculoskeletal negative musculoskeletal ROS (+)    Abdominal   Peds negative pediatric ROS (+)  Hematology negative hematology ROS (+)   Anesthesia Other Findings   Reproductive/Obstetrics negative OB ROS                             Anesthesia Physical Anesthesia Plan  ASA: 3  Anesthesia Plan: General   Post-op Pain Management: Dilaudid IV   Induction: Intravenous  PONV Risk Score and Plan: 2 and Ondansetron, Dexamethasone and Treatment may vary due to age or medical condition  Airway Management Planned: Oral ETT  Additional Equipment: ClearSight  Intra-op Plan:   Post-operative Plan: Extubation in OR  Informed Consent: I have reviewed the patients History and Physical, chart, labs and discussed the procedure including the risks, benefits and alternatives for the proposed anesthesia with the patient or authorized representative who has indicated his/her understanding and acceptance.     Dental advisory given  Plan Discussed with: CRNA and Surgeon  Anesthesia Plan Comments:        Anesthesia Quick Evaluation

## 2022-06-06 NOTE — Interval H&P Note (Signed)
History and Physical Interval Note:  06/06/2022 9:24 AM  Travis Wolfe  has presented today for surgery, with the diagnosis of Infected right total hip arthroplasty revision.  The various methods of treatment have been discussed with the patient and family. After consideration of risks, benefits and other options for treatment, the patient has consented to  Procedure(s): EXCISIONAL TOTAL HIP ARTHROPLASTY WITH ANTIBIOTIC SPACERS (Right) as a surgical intervention.  The patient's history has been reviewed, patient examined, no change in status, stable for surgery.  I have reviewed the patient's chart and labs.  Questions were answered to the patient's satisfaction.     [name]

## 2022-06-06 NOTE — Op Note (Signed)
NAME: Travis Wolfe, Travis Wolfe MEDICAL RECORD NO: [id number] ACCOUNT NO: [id number] DATE OF BIRTH: [birth year] FACILITY: [id number] PHYSICIAN: Madlyn Frankel. 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   PROCEDURE:  Excision/resection right total hip arthroplasty with placement of a static antibiotic spacer.  SURGEON:  Madlyn Frankel. Charlann Boxer, MD  ASSISTANT:  Rosalene Billings, PA-C.  Note that Ms. Domenic Schwab was present for the entirety of the case from preoperative positioning, perioperative management of the operative extremity, general facilitation of the case and primary wound closure.  ANESTHESIA:  General.  DRAINS:  1 Medium Hemovac.  BLOOD LOSS:  About 1400 mL.   COMPLICATIONS:  None.  INDICATIONS FOR THE PROCEDURE:  The patient is a pleasant 87 year old male who had a history of primary index total hip arthroplasty performed approximately 2003.  This was apparently complicated by metalosis and was subsequently revised.  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  He has had multiple surgical procedures including recently the development of recurrent postoperative seromas.  Most recently an attempt to  try to decompress this area and prevent further operations we had performed an I and D with placement of a wound VAC to try to decompress the space to allow for healing.  However, this proved to be unsuccessful with persistent seroma formation.  We have  had lengthy discussions regarding long-term management and the potential need to resect his hip.  At this point, I felt that it was in his best interest from a hip  standpoint, but also from a generalized health to resect his hip.  Whether or not we  planned to perform a reimplantation now will be dependent on how he heals and the response to the overall postoperative infection and/or seroma.  Consent was obtained for management of the above.  DESCRIPTION OF PROCEDURE:  The patient was brought to the operative theater.  Once adequate anesthesia, preoperative antibiotics, Ancef initially administered, he was positioned into the left lateral decubitus position with the right hip up.  The right  lower extremity was then prepped and draped in sterile fashion.  Timeout was performed identifying the patient, planned procedure, and extremity.  I excised his old incision including the area where we had the wound VAC.  This area was ellipsed out and  soft tissue planes created.  I debrided scar tissue in the subcutaneous layer sharply.  I then developed the layer of the gluteal fascia and iliotibial band.  These were then split.  I first attended to initial exposure of the posterior aspect of the  hip.  Following exposure of the posterior aspect of the hip, I was able to dislocate the femoral head and removed the femoral head.  We then placed the trunnion onto the ilium.  Following further exposure and debridement of soft tissue around the cup, we  used the Innomed explant system with a 56 blades and I was able to then remove the acetabulum with minimal bone loss.  Once this was done, attention was directed at the extended trochanteric osteotomy necessary to remove the old press-fit long  approximately 8 inch stem.  I elevated the vastus lateralis off the posterior intermuscular  septum.  I then used a wide saw and made a cut off the lateral posterior third of the femur and then made an angled step cut distally.  I checked the distance of  the osteotomy with a drill bit to a certain that we were at the distal portion of the stem.  I then carefully used a series of osteotomes and  elevated the lateral aspect of the femur off of the lateral border of the component.  There was a small crack in the mid portion of it,  but in general, it was held in continuity.  With the femur now exposed, I used a pencil tip bur and osteotomes and worked carefully around the prosthesis to eventually loosen it.  Once the component was removed, we irrigated the hip with 3 liters of  normal saline solution.  Once this was done, I used four 16 gauge wires and reapproximated the osteotomy segments to the femur itself.  I then mixed two batches of cement with 2 grams of vancomycin and 2.4 tobramycin.  When this was near in a solid  formation, we placed it into the acetabulum.  In addition to the irrigation of normal saline solution, we then used Prontosan antimicrobial solution.  Once this was completed, I reapproximated the iliotibial band and gluteal fascia using a combination of  #1 Vicryl and 0 V-Loc suture.  The remainder of the wound was closed in layers with 2-0 Vicryl and a running Monocryl stitch.  The wound was clean, dry and dressed sterilely using surgical glue and Aquacel dressing.  He was then brought to the recovery  room in stable condition.    Note that I did place a medium Hemovac drain deep to try to decrease some of the postoperative bleeding formation within the deep layer.  He was brought to the recovery room in stable condition, tolerating the procedure well.  Findings reviewed with his  wife.     SUJ D: 06/06/2022 1:17:53 pm T: 06/06/2022 11:47:00 pm  JOB: [id number]/ [id number]

## 2022-06-06 NOTE — Progress Notes (Signed)
Blood stopped at this time.

## 2022-06-06 NOTE — Plan of Care (Signed)

## 2022-06-06 NOTE — Discharge Instructions (Addendum)
INSTRUCTIONS AFTER SURGERY  Drain Care:  Please empty hemovac daily. Leave hemovac in place until follow up. This is not sutured in place - so be careful not to tug on the cord when moving patient.  Pain Regimen Instructions:  - Take tylenol 1000 mg every 6-8 hours around the clock  - Take the muscle relaxant around the clock  - Then take 5 mg (1 tablet) of oxycodone every 4 hours as needed for severe pain  - Ice and elevate your leg as often as you can  Do not take over the counter pain medication until instructed by [place].  If this is not controlling your pain, or you need refills - call our office at [phone] or send a message via the [employer] portal.   Take the stool softeners provided until you are having regular bowel movements, and then you may discontinue.    Remove items at home which could result in a fall. This includes throw rugs or furniture in walking pathways ICE to the affected joint every three hours while awake for 30 minutes at a time, for at least the first 3-5 days, and then as needed for pain and swelling.  Continue to use ice for pain and swelling. You may notice swelling that will progress down to the foot and ankle.  This is normal after surgery.  Elevate your leg when you are not up walking on it.   Continue to use the breathing machine you got in the hospital (incentive spirometer) which will help keep your temperature down.  It is common for your temperature to cycle up and down following surgery, especially at night when you are not up moving around and exerting yourself.  The breathing machine keeps your lungs expanded and your temperature down.   DIET:  As you were doing prior to hospitalization, we recommend a well-balanced diet.  DRESSING / WOUND CARE / SHOWERING  Keep the surgical dressing until follow up.  The dressing is water proof, so you can shower without any extra covering.  IF THE DRESSING FALLS OFF or the wound gets wet inside, change the  dressing with sterile gauze.  Please use good hand washing techniques before changing the dressing.  Do not use any lotions or creams on the incision until instructed by your surgeon.    ACTIVITY  Increase activity slowly as tolerated, but follow the weight bearing instructions below.   No driving for 6 weeks or until further direction given by your physician.  You cannot drive while taking narcotics.  No lifting or carrying greater than 10 lbs. until further directed by your surgeon. Avoid periods of inactivity such as sitting longer than an hour when not asleep. This helps prevent blood clots.  You may return to work once you are authorized by your doctor.     WEIGHT BEARING   Touch down weight bearing RLE.  CONSTIPATION  Constipation is defined medically as fewer than three stools per week and severe constipation as less than one stool per week.  Even if you have a regular bowel pattern at home, your normal regimen is likely to be disrupted due to multiple reasons following surgery.  Combination of anesthesia, postoperative narcotics, change in appetite and fluid intake all can affect your bowels.   YOU MUST use at least one of the following options; they are listed in order of increasing strength to get the job done.  They are all available over the counter, and you may need to use some,  POSSIBLY even all of these options:    Drink plenty of fluids (prune juice may be helpful) and high fiber foods Colace 100 mg by mouth twice a day  Senokot for constipation as directed and as needed Dulcolax (bisacodyl), take with full glass of water  Miralax (polyethylene glycol) once or twice a day as needed.  If you have tried all these things and are unable to have a bowel movement in the first 3-4 days after surgery call either your surgeon or your primary doctor.    If you experience loose stools or diarrhea, hold the medications until you stool forms back up.  If your symptoms do not get  better within 1 week or if they get worse, check with your doctor.  If you experience "the worst abdominal pain ever" or develop nausea or vomiting, please contact the office immediately for further recommendations for treatment.   ITCHING:  If you experience itching with your medications, try taking only a single pain pill, or even half a pain pill at a time.  You can also use Benadryl over the counter for itching or also to help with sleep.   TED HOSE STOCKINGS:  Use stockings on both legs until for at least 2 weeks or as directed by physician office. They may be removed at night for sleeping.  MEDICATIONS:  See your medication summary on the "After Visit Summary" that nursing will review with you.  You may have some home medications which will be placed on hold until you complete the course of blood thinner medication.  It is important for you to complete the blood thinner medication as prescribed.  PRECAUTIONS:  If you experience chest pain or shortness of breath - call 911 immediately for transfer to the hospital emergency department.   If you develop a fever greater that 101 F, purulent drainage from wound, increased redness or drainage from wound, foul odor from the wound/dressing, or calf pain - CONTACT YOUR SURGEON.                                                   FOLLOW-UP APPOINTMENTS:  If you do not already have a post-op appointment, please call the office for an appointment to be seen by your surgeon.  Guidelines for how soon to be seen are listed in your "After Visit Summary", but are typically between 1-4 weeks after surgery.  POST-OPERATIVE OPIOID TAPER INSTRUCTIONS: It is important to wean off of your opioid medication as soon as possible. If you do not need pain medication after your surgery it is ok to stop day one. Opioids include: Codeine, Hydrocodone(Norco, Vicodin), Oxycodone(Percocet, oxycontin) and hydromorphone amongst others.  Long term and even short term use of  opiods can cause: Increased pain response Dependence Constipation Depression Respiratory depression And more.  Withdrawal symptoms can include Flu like symptoms Nausea, vomiting And more Techniques to manage these symptoms Hydrate well Eat regular healthy meals Stay active Use relaxation techniques(deep breathing, meditating, yoga) Do Not substitute Alcohol to help with tapering If you have been on opioids for less than two weeks and do not have pain than it is ok to stop all together.  Plan to wean off of opioids This plan should start within one week post op of your joint replacement. Maintain the same interval or time between taking each dose and  first decrease the dose.  Cut the total daily intake of opioids by one tablet each day Next start to increase the time between doses. The last dose that should be eliminated is the evening dose.   MAKE SURE YOU:  Understand these instructions.  Get help right away if you are not doing well or get worse.    Thank you for letting us be a part of your medical care team.  It is a privilege we respect greatly.  We hope these instructions will help you stay on track for a fast and full recovery!

## 2022-06-06 NOTE — Brief Op Note (Signed)
06/06/2022  1:00 PM  PATIENT:  Travis Wolfe  87 y.o. 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  PROCEDURE:  Procedure(s): EXCISIONAL TOTAL HIP ARTHROPLASTY WITH placement of STATIC ANTIBIOTIC SPACER (Right)  SURGEON:  Surgeon(s) and Role:    Durene Romans, MD - Primary  PHYSICIAN ASSISTANT: Rosalene Billings, PA-C  ANESTHESIA:   general  EBL:  1475 mL   BLOOD ADMINISTERED: 1 unit PRBC with plans to give a second in PACU  DRAINS: (1 medium) Hemovact drain(s) in the right hip with  Suction Open   LOCAL MEDICATIONS USED:  NONE  SPECIMEN:  No Specimen  DISPOSITION OF SPECIMEN:  N/A  COUNTS:  YES  TOURNIQUET:  * No tourniquets in log *  DICTATION: .Other Dictation: Dictation Number [id number]  PLAN OF CARE: Admit to inpatient   PATIENT DISPOSITION:  PACU - hemodynamically stable.   Delay start of Pharmacological VTE agent (>24hrs) due to surgical blood loss or risk of bleeding: no

## 2022-06-07 ENCOUNTER — Encounter (HOSPITAL_COMMUNITY): Payer: Self-pay | Admitting: Orthopedic Surgery

## 2022-06-07 DIAGNOSIS — T8451XD Infection and inflammatory reaction due to internal right hip prosthesis, subsequent encounter: Secondary | ICD-10-CM

## 2022-06-07 LAB — TYPE AND SCREEN
ABO/RH(D): A POS
Antibody Screen: NEGATIVE
Unit division: 0
Unit division: 0

## 2022-06-07 LAB — BASIC METABOLIC PANEL
Anion gap: 6 (ref 5–15)
BUN: 21 mg/dL (ref 8–23)
CO2: 23 mmol/L (ref 22–32)
Calcium: 7.5 mg/dL — ABNORMAL LOW (ref 8.9–10.3)
Chloride: 109 mmol/L (ref 98–111)
Creatinine, Ser: 1.18 mg/dL (ref 0.61–1.24)
GFR, Estimated: >60 mL/min (ref >60)
Glucose, Bld: 129 mg/dL — ABNORMAL HIGH (ref 70–99)
Potassium: 4.9 mmol/L (ref 3.5–5.1)
Sodium: 138 mmol/L (ref 135–145)

## 2022-06-07 LAB — BPAM RBC
Blood Product Expiration Date: 202401182359
Blood Product Expiration Date: 202401182359
ISSUE DATE / TIME: 202312211221
ISSUE DATE / TIME: 202312211221
Unit Type and Rh: 6200
Unit Type and Rh: 6200

## 2022-06-07 LAB — CBC
HCT: 24.8 % — ABNORMAL LOW (ref 39.0–52.0)
Hemoglobin: 8.3 g/dL — ABNORMAL LOW (ref 13.0–17.0)
MCH: 31.8 pg (ref 26.0–34.0)
MCHC: 33.5 g/dL (ref 30.0–36.0)
MCV: 95 fL (ref 80.0–100.0)
Platelets: 166 10*3/uL (ref 150–400)
RBC: 2.61 MIL/uL — ABNORMAL LOW (ref 4.22–5.81)
RDW: 15.1 % (ref 11.5–15.5)
WBC: 13.4 10*3/uL — ABNORMAL HIGH (ref 4.0–10.5)
nRBC: 0 % (ref 0.0–0.2)

## 2022-06-07 LAB — CK: Total CK: 199 U/L (ref 49–397)

## 2022-06-07 MED ORDER — VANCOMYCIN HCL 1500 MG/300ML IV SOLN
1500.0000 mg | Freq: Once | INTRAVENOUS | Status: AC
  Administered 2022-06-07: 1500 mg via INTRAVENOUS
  Filled 2022-06-07: qty 300

## 2022-06-07 MED ORDER — SODIUM CHLORIDE 0.9 % IV SOLN
2.0000 g | Freq: Every day | INTRAVENOUS | Status: DC
  Administered 2022-06-07 – 2022-06-12 (×6): 2 g via INTRAVENOUS
  Filled 2022-06-07 (×6): qty 20

## 2022-06-07 MED ORDER — CEFTRIAXONE IV (FOR PTA / DISCHARGE USE ONLY): 2.0000 g | INTRAVENOUS | 0 refills | Status: DC

## 2022-06-07 MED ORDER — VANCOMYCIN HCL IN DEXTROSE 1-5 GM/200ML-% IV SOLN: 1000.0000 mg | INTRAVENOUS | Status: DC

## 2022-06-07 MED ORDER — SODIUM CHLORIDE 0.9 % IV SOLN
500.0000 mg | INTRAVENOUS | Status: DC
  Administered 2022-06-08 – 2022-06-12 (×5): 500 mg via INTRAVENOUS
  Filled 2022-06-07 (×5): qty 10

## 2022-06-07 MED ORDER — DAPTOMYCIN IV (FOR PTA / DISCHARGE USE ONLY): 500.0000 mg | INTRAVENOUS | 0 refills | Status: DC

## 2022-06-07 MED ORDER — APIXABAN 2.5 MG PO TABS
2.5000 mg | ORAL_TABLET | Freq: Two times a day (BID) | ORAL | Status: DC
  Administered 2022-06-07 – 2022-06-12 (×10): 2.5 mg via ORAL
  Filled 2022-06-07 (×10): qty 1

## 2022-06-07 MED ORDER — TRANEXAMIC ACID 650 MG PO TABS
1950.0000 mg | ORAL_TABLET | Freq: Every day | ORAL | Status: AC
  Administered 2022-06-07 – 2022-06-09 (×3): 1950 mg via ORAL
  Filled 2022-06-07 (×3): qty 3

## 2022-06-07 NOTE — Evaluation (Signed)
Physical Therapy Evaluation Patient Details Name: Travis Wolfe MRN: [id number] DOB: 11-13-1934 Today's Date: 06/07/2022  History of Present Illness  Pt is an 87 year old male s/p R THR resection with static spacer placement. PMHx: s/p IRRIGATION AND DEBRIDEMENT RIGHT HIP, EVACUATION OF PSEUDOTUMOR on 05/17/21, R THA with history of infection and I&D, PAF, falls, HLD, HTN, lumbar fusion, kyphoplasty, L RTC repair  Clinical Impression  Pt admitted as above and presenting with functional mobility limitations 2* decreased R LE strength/ROM, post op pain, TWB status and balance deficits.  Pt currently requiring significant assist of two for all mobility tasks and tolerated move to standing only before returning to bed 2* fatigue and elevated pain level.  Pt states hopeful to progress to dc home...Marland KitchenMarland KitchenMarland Kitchen     Recommendations for follow up therapy are one component of a multi-disciplinary discharge planning process, led by the attending physician.  Recommendations may be updated based on patient status, additional functional criteria and insurance authorization.  Follow Up Recommendations Follow physician's recommendations for discharge plan and follow up therapies      Assistance Recommended at Discharge Frequent or constant Supervision/Assistance  Patient can return home with the following  Two people to help with walking and/or transfers;A lot of help with bathing/dressing/bathroom;Assistance with cooking/housework;Assist for transportation;Help with stairs or ramp for entrance    Equipment Recommendations None recommended by PT  Recommendations for Other Services       Functional Status Assessment Patient has had a recent decline in their functional status and demonstrates the ability to make significant improvements in function in a reasonable and predictable amount of time.     Precautions / Restrictions Precautions Precautions: Fall Restrictions Weight Bearing Restrictions: Yes RLE  Weight Bearing: Touchdown weight bearing      Mobility  Bed Mobility Overal bed mobility: Needs Assistance Bed Mobility: Supine to Sit, Sit to Supine     Supine to sit: Max assist, +2 for physical assistance, +2 for safety/equipment Sit to supine: Max assist, +2 for physical assistance, +2 for safety/equipment   General bed mobility comments: INcreased time with cues for sequence, use of L LE to self assist and physical assist to manage R LE, to control trunk and to rotate pt to/from EOB with bed pad    Transfers Overall transfer level: Needs assistance Equipment used: Rolling walker (2 wheels) Transfers: Sit to/from Stand Sit to Stand: From elevated surface, Mod assist, Max assist, +2 physical assistance, +2 safety/equipment           General transfer comment: extensive use of bed with physical assist to bring wt up and fwd and to balance in standing with RW    Ambulation/Gait               General Gait Details: Pt able to heel/toe towards head of bed but unable to take step  Stairs            Wheelchair Mobility    Modified Rankin (Stroke Patients Only)       Balance Overall balance assessment: Needs assistance Sitting-balance support: Bilateral upper extremity supported, Feet supported Sitting balance-Leahy Scale: Poor Sitting balance - Comments: posterior lean   Standing balance support: Bilateral upper extremity supported Standing balance-Leahy Scale: Poor Standing balance comment: very unsteady with cues for UE WB on RW                             Pertinent Vitals/Pain Pain Assessment  Pain Assessment: 0-10 Pain Score: 8  Pain Location: R hip Pain Descriptors / Indicators: Aching, Grimacing, Guarding, Sore Pain Intervention(s): Limited activity within patient's tolerance, Monitored during session, Premedicated before session, Ice applied    Home Living Family/patient expects to be discharged to:: Private residence Living  Arrangements: Spouse/significant other Available Help at Discharge: Family Type of Home: House Home Access: Stairs to enter Entrance Stairs-Rails: Doctor, general practice of Steps: 3 deep (wife states arranging ramp but not in place at this time)   Home Layout: Able to live on main level with bedroom/bathroom Home Equipment: Agricultural consultant (2 wheels);Cane - single point;Wheelchair - manual      Prior Function Prior Level of Function : Independent/Modified Independent             Mobility Comments: using RW       Hand Dominance   Dominant Hand: Right    Extremity/Trunk Assessment   Upper Extremity Assessment Upper Extremity Assessment: Overall WFL for tasks assessed    Lower Extremity Assessment Lower Extremity Assessment: RLE deficits/detail RLE: Unable to fully assess due to pain    Cervical / Trunk Assessment Cervical / Trunk Assessment: Normal  Communication   Communication: No difficulties  Cognition Arousal/Alertness: Awake/alert Behavior During Therapy: WFL for tasks assessed/performed Overall Cognitive Status: Within Functional Limits for tasks assessed                                          General Comments      Exercises General Exercises - Lower Extremity Ankle Circles/Pumps: AROM, Both, 15 reps, Supine   Assessment/Plan    PT Assessment Patient needs continued PT services  PT Problem List Decreased strength;Decreased range of motion;Decreased activity tolerance;Decreased balance;Decreased mobility;Decreased knowledge of use of DME;Pain       PT Treatment Interventions DME instruction;Gait training;Stair training;Functional mobility training;Therapeutic activities;Therapeutic exercise;Patient/family education;Balance training    PT Goals (Current goals can be found in the Care Plan section)  Acute Rehab PT Goals Patient Stated Goal: HOME PT Goal Formulation: All assessment and education complete, DC therapy Time  For Goal Achievement: 06/20/22 Potential to Achieve Goals: Fair    Frequency 7X/week     Co-evaluation               AM-PAC PT "6 Clicks" Mobility  Outcome Measure Help needed turning from your back to your side while in a flat bed without using bedrails?: A Lot Help needed moving from lying on your back to sitting on the side of a flat bed without using bedrails?: A Lot Help needed moving to and from a bed to a chair (including a wheelchair)?: Total Help needed standing up from a chair using your arms (e.g., wheelchair or bedside chair)?: Total Help needed to walk in hospital room?: Total Help needed climbing 3-5 steps with a railing? : Total 6 Click Score: 8    End of Session Equipment Utilized During Treatment: Gait belt Activity Tolerance: Patient limited by pain Patient left: in bed;with call bell/phone within reach;with nursing/sitter in room;with family/visitor present Nurse Communication: Mobility status PT Visit Diagnosis: Difficulty in walking, not elsewhere classified (R26.2);Pain Pain - Right/Left: Right Pain - part of body: Hip    Time: 1128-1202 PT Time Calculation (min) (ACUTE ONLY): 34 min   Charges:   PT Evaluation $PT Eval Low Complexity: 1 Low PT Treatments $Therapeutic Activity: 8-22 mins  [place] Pager [phone] Office [phone]   Carmalita Wakefield 06/07/2022, 1:00 PM

## 2022-06-07 NOTE — TOC Initial Note (Addendum)
Transition of Care [facility]) - Initial/Assessment Note   Patient Details  Name: Travis Wolfe MRN: [id number] Date of Birth: Jul 22, 1934  [facility] CM/SW Contact:    Ewing Schlein, LCSW Phone Number: 06/07/2022, 1:39 PM  Clinical Narrative: Patient will need to discharge home on IV antibiotics. CSW made IV antibiotic referral to [facility] with [facility]. [facility] to provide [facility] for labs and PICC line maintenance. CSW updated patient. HHRN orders have been placed. OPAT order in. TOC awaiting PICC line placement.  AddendumFrances Furbish will see patient on 12/26 for nursing.  Expected Discharge Plan: Home w Home Health Services Barriers to Discharge: No Barriers Identified  Patient Goals and CMS Choice Patient states their goals for this hospitalization and ongoing recovery are:: Return home with wife CMS Medicare.gov Compare Post Acute Care list provided to:: Patient Choice offered to / list presented to : Patient  Expected Discharge Plan and Services In-house Referral: Clinical Social Work Post Acute Care Choice: Home Health Living arrangements for the past 2 months: Single Family Home Expected Discharge Date: 06/07/22               DME Arranged: N/A DME Agency: NA HH Arranged: RN, IV Antibiotics HH Agency: [facility], [facility] Date [facility] Agency Contacted: 06/07/22 Representative spoke with at [facility] Agency: Pam  Prior Living Arrangements/Services Living arrangements for the past 2 months: Single Family Home Lives with:: Spouse Patient language and need for interpreter reviewed:: Yes Do you feel safe going back to the place where you live?: Yes      Need for Family Participation in Patient Care: No (Comment) Care giver support system in place?: Yes (comment) Criminal Activity/Legal Involvement Pertinent to Current Situation/Hospitalization: No - Comment as needed  Activities of Daily Living Home Assistive Devices/Equipment: Environmental consultant (specify type), Wheelchair (rolling  walker) ADL Screening (condition at time of admission) Patient's cognitive ability adequate to safely complete daily activities?: Yes Is the patient deaf or have difficulty hearing?: No Does the patient have difficulty seeing, even when wearing glasses/contacts?: No Does the patient have difficulty concentrating, remembering, or making decisions?: No Patient able to express need for assistance with ADLs?: Yes Does the patient have difficulty dressing or bathing?: Yes Independently performs ADLs?: No Communication: Independent Dressing (OT): Needs assistance Grooming: Needs assistance Is this a change from baseline?: Pre-admission baseline Feeding: Independent Bathing: Needs assistance Is this a change from baseline?: Pre-admission baseline Toileting: Independent In/Out Bed: Independent Walks in Home: Independent Does the patient have difficulty walking or climbing stairs?: Yes Weakness of Legs: Right Weakness of Arms/Hands: None  Permission Sought/Granted Permission sought to share information with : Other (comment) Permission granted to share information with : Yes, Verbal Permission Granted Permission granted to share info w AGENCY: [facility]  Emotional Assessment Attitude/Demeanor/Rapport: Engaged Affect (typically observed): Accepting Orientation: : Oriented to Self, Oriented to Place, Oriented to  Time, Oriented to Situation Alcohol / Substance Use: Not Applicable Psych Involvement: No (comment)  Admission diagnosis:  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.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] Patient Active Problem List   Diagnosis Date Noted   Postoperative seroma of subcutaneous tissue after non-dermatologic procedure 05/02/2022   Hematoma of hip, right, subsequent encounter 01/31/2022   Hematoma of hip, right, sequela 01/31/2022   History of revision of total replacement of right hip joint 05/17/2021   Arthrofibrosis of hip joint, right 11/26/2018   Pain    Fever 02/20/2018    Metallosis    Staphylococcus infection    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) 01/26/2018   S/P hip replacement 01/26/2018   Cellulitis of right leg 12/15/2017   PAF (paroxysmal atrial fibrillation) (HCC) 12/15/2017   Abdominal wall pain in right lower quadrant 12/02/2012   PCP:  Elijio Miles., MD Pharmacy:   [id number] - [place], [place] - [address] [place] [address] Phone: [phone] Fax: [phone]  [id number] - [place], [place] - [address] [place] [address] Phone: [phone] Fax: [phone]  [id number] - [place], [place] - [address] [place] [address] Phone: [phone] Fax: [phone]  Social Determinants of Health (SDOH) Social History: SDOH Screenings   Food Insecurity: No Food Insecurity (06/06/2022)  Housing: Low Risk  (06/06/2022)  Transportation Needs: No Transportation Needs (06/06/2022)  Utilities: Not At Risk (06/06/2022)  Depression (PHQ2-9): Low Risk  (04/11/2019)  Tobacco Use: Low Risk  (06/06/2022)   SDOH Interventions: N/A  Readmission Risk Interventions     No data to display

## 2022-06-07 NOTE — Consult Note (Addendum)
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  Admitted for management chronic right hip seroma:  #Excision of right total hip arthroplasty with placement of static antibiotic spacer on 12/21 #Hx of chronic seroma of right hip with wound vac placement -Taken to OR with Dr. Arlington Calix) for excision/resection of right total hip arthoplasty and abx spacer placement. Unclear if spacer will be removed.  -Plan on empiric 6 weeks of antibiotics form OR on 12/21 Recommendations:  -Place PICC -Start Ceftriaxone and vancomycin(dapto on discharge) -6 weeks of abx for PJI -PICC order in place -F/U in ID clinic   ID will sign off   OPAT ORDERS:  Diagnosis: Right hip PJI SP    Allergies  Allergen Reactions   Tape Dermatitis and Rash    ONLY use Paper tape   Other     Cat dander   Doxycycline Rash   Horse Epithelium Rash     Discharge antibiotics to be given via PICC line:  Per pharmacy protocol Daptomycin 8m/kg + ctx 2gm /day    Duration: 6 weeks End Date: 07/19/22  [facility] with [facility] Use: Home health RN for IV administration and teaching, line care and labs.    Labs weekly while on IV antibiotics: x__ CBC with differential _x_ CMP x__ CRP _x_ ESR _x_ CK   _x_ Please leave PIC in place until doctor has seen patient or been notified  Fax weekly labs to ([phone] Clinic Follow Up Appt: 06/25/22  @ [facility] with Dr. SCandiss Norse   I have personally spent 85 minutes involved in face-to-face and non-face-to-face activities for this patient on the day of the  visit. Professional time spent includes the following activities: Preparing to see the patient (review of tests), Obtaining and/or reviewing separately obtained history (admission/discharge record), Performing a medically appropriate examination and/or evaluation , Ordering medications/tests/procedures, referring and communicating with other health care professionals, Documenting clinical information in the EMR, Independently interpreting results (not separately reported), Communicating results to the patient/family/caregiver, Counseling and educating the patient/family/caregiver and Care coordination (not separately reported).   Microbiology:   Antibiotics: Vancomycin and cefazolin  HPI: Travis DOWTYis a 87y.o. male  with history of primary index total hip arthroplasty in 20093complicated by an metallosis and subsequently revised.  Admitted for chronic right hip seroma likely associate with infection.  He underwent excision and resection of right total hip arthroplasty with placement of static antibiotic spacer on 12/21, with Dr. MParalee Cancelorthopedics.  Patient has been followed by Dr. OAlvan Dameforabout 6 to 8 years.  Underwent multiple surgical procedures including recent development of recurrent postop seromas.  With wound VAC placed to decompress the space.  This was unsuccessful.  He had been on cefadroxil prior to hospitalization.    Review of Systems: Review of Systems  All other systems reviewed and are negative.   Past Medical History:  Diagnosis Date   A-fib ([place]    REPORTS WAS DUE TO DEHYDRATION 3 YEARS AGO BUT WAS GIVEN FLUIDS IN ED  AND MEDS TO SLOW HR AND DROVE HIMSELF HOME  ; HAD ABLATION X2 WITH CARDIO DR. AKBARY AS PRECAUTION  ; has been on eliquis since but reports he has not taken it for 5 weeks b/c he was told to hold it for the multiple irrigations and debridements for the hip infection. reports his cardiologist is aware he has been holding it;    Arthritis     Atherosclerosis of both carotid arteries    BPH (benign prostatic hyperplasia)    Dysrhythmia    a-fib   Fall    fell on friday 01-16-18;   lost balAnce whIile tryng to get his sock on ;sustained skin tear to left wrist ; no drainage  , scabbed over , and has been covering with bandage    Heart murmur    SINCE HIS 20s   HLD (hyperlipidemia)    Hypertension    Non-rheumatic mitral regurgitation     Social History   Tobacco Use   Smoking status: Never   Smokeless tobacco: Never  Vaping Use   Vaping Use: Never used  Substance Use Topics   Alcohol use: Yes    Alcohol/week: 3.0 standard drinks of alcohol    Types: 3 Standard drinks or equivalent per week    Comment: socially    Drug use: No    Family History  Problem Relation Age of Onset   Hypertension Mother    Cancer Mother    Scheduled Meds:  sodium chloride   Intravenous Once   apixaban  2.5 mg Oral Daily   docusate sodium  100 mg Oral BID   dorzolamide  1 drop Both Eyes BID   And   timolol  1 drop Both Eyes BID   polyethylene glycol  17 g Oral BID   ranolazine  500 mg Oral BID   tranexamic acid  1,950 mg Oral Daily   Continuous Infusions:  sodium chloride 75 mL/hr at 06/07/22 0600   methocarbamol (ROBAXIN) IV Stopped (06/06/22 1530)   PRN Meds:.acetaminophen, bisacodyl, diphenhydrAMINE, furosemide, HYDROcodone-acetaminophen, HYDROcodone-acetaminophen, menthol-cetylpyridinium **OR** phenol, methocarbamol **OR** methocarbamol (ROBAXIN) IV, metoCLOPramide **OR** metoCLOPramide (REGLAN) injection, morphine injection, ondansetron **OR** ondansetron (ZOFRAN) IV, potassium chloride, zolpidem Allergies  Allergen Reactions   Tape Dermatitis and Rash    ONLY use Paper tape   Other     Cat dander   Doxycycline Rash   Horse Epithelium Rash    OBJECTIVE: Blood pressure (!) 154/68, pulse 69, temperature 97.7 F (36.5 C), temperature source Axillary, resp. rate 17, height _0  (1.727 m), weight 68 kg, SpO2 99  %.  Physical Exam Constitutional:      General: He is not in acute distress.    Appearance: He is normal weight. He is not toxic-appearing.  HENT:     Head: Normocephalic and atraumatic.     Right Ear: External ear normal.     Left Ear: External ear normal.     Nose: No congestion or rhinorrhea.     Mouth/Throat:     Mouth: Mucous membranes are moist.     Pharynx: Oropharynx is clear.  Eyes:     Extraocular Movements: Extraocular movements intact.     Conjunctiva/sclera: Conjunctivae normal.     Pupils: Pupils are equal, round, and reactive to light.  Cardiovascular:     Rate and Rhythm: Normal rate and regular rhythm.     Heart sounds: No murmur heard.    No friction rub. No gallop.  Pulmonary:     Effort: Pulmonary  effort is normal.     Breath sounds: Normal breath sounds.  Abdominal:     General: Abdomen is flat. Bowel sounds are normal.     Palpations: Abdomen is soft.  Musculoskeletal:        General: No swelling.     Cervical back: Normal range of motion and neck supple.     Comments: Right hip drain in place  Skin:    General: Skin is warm and dry.  Neurological:     General: No focal deficit present.     Mental Status: He is oriented to person, place, and time.  Psychiatric:        Mood and Affect: Mood normal.     Lab Results Lab Results  Component Value Date   WBC 13.4 (H) 06/07/2022   HGB 8.3 (L) 06/07/2022   HCT 24.8 (L) 06/07/2022   MCV 95.0 06/07/2022   PLT 166 06/07/2022    Lab Results  Component Value Date   CREATININE 1.18 06/07/2022   BUN 21 06/07/2022   NA 138 06/07/2022   K 4.9 06/07/2022   CL 109 06/07/2022   CO2 23 06/07/2022    Lab Results  Component Value Date   ALT 28 05/16/2021   AST 36 05/16/2021   ALKPHOS 76 05/16/2021   BILITOT 1.6 (H) 05/16/2021       Laurice Record, [place] [facility] 06/07/2022, 9:49 AM

## 2022-06-07 NOTE — Progress Notes (Signed)
Subjective: 1 Day Post-Op Procedure(s) (LRB): EXCISIONAL TOTAL HIP ARTHROPLASTY WITH ANTIBIOTIC SPACERS (Right) Patient reports pain as mild.   Patient seen in rounds with Dr. Charlann Boxer. Patient is resting in bed on exam this morning. No acute events overnight. Foley catheter in place. Patient requests this to stay in place until tomorrow after he has worked with PT. He is in good spirits this morning.   We will start therapy today.   Objective: Vital signs in last 24 hours: Temp:  [97.7 F (36.5 C)-98.3 F (36.8 C)] 98.1 F (36.7 C) (12/22 0539) Pulse Rate:  [61-74] 66 (12/22 0539) Resp:  [11-20] 18 (12/22 0539) BP: (102-154)/(53-74) 131/53 (12/22 0539) SpO2:  [97 %-100 %] 97 % (12/22 0539) Weight:  [68 kg] 68 kg (12/21 1527)  Intake/Output from previous day:  Intake/Output Summary (Last 24 hours) at 06/07/2022 0812 Last data filed at 06/07/2022 0600 Gross per 24 hour  Intake 4424.14 ml  Output 2415 ml  Net 2009.14 ml     Intake/Output this shift: No intake/output data recorded.  Labs: Recent Labs    06/06/22 1213 06/07/22 0343  HGB 8.5* 8.3*   Recent Labs    06/06/22 1213 06/07/22 0343  WBC  --  13.4*  RBC  --  2.61*  HCT 25.0* 24.8*  PLT  --  166   Recent Labs    06/06/22 1213 06/07/22 0343  NA 140 138  K 4.5 4.9  CL  --  109  CO2  --  23  BUN  --  21  CREATININE  --  1.18  GLUCOSE  --  129*  CALCIUM  --  7.5*   No results for input(s): "LABPT", "INR" in the last 72 hours.  Exam: General - Patient is Alert and Oriented Extremity - Neurologically intact Sensation intact distally Intact pulses distally Dorsiflexion/Plantar flexion intact Dressing - dressing C/D/I Motor Function - intact, moving foot and toes well on exam.   Past Medical History:  Diagnosis Date   A-fib ([facility])    REPORTS WAS DUE TO DEHYDRATION 3 YEARS AGO BUT WAS GIVEN FLUIDS IN ED AND MEDS TO SLOW HR AND DROVE HIMSELF HOME  ; HAD ABLATION X2 WITH CARDIO DR. AKBARY AS  PRECAUTION  ; has been on eliquis since but reports he has not taken it for 5 weeks b/c he was told to hold it for the multiple irrigations and debridements for the hip infection. reports his cardiologist is aware he has been holding it;    Arthritis    Atherosclerosis of both carotid arteries    BPH (benign prostatic hyperplasia)    Dysrhythmia    a-fib   Fall    fell on friday 01-16-18;   lost balAnce whIile tryng to get his sock on ;sustained skin tear to left wrist ; no drainage  , scabbed over , and has been covering with bandage    Heart murmur    SINCE HIS 20s   HLD (hyperlipidemia)    Hypertension    Non-rheumatic mitral regurgitation     Assessment/Plan: 1 Day Post-Op Procedure(s) (LRB): EXCISIONAL TOTAL HIP ARTHROPLASTY WITH ANTIBIOTIC SPACERS (Right) Principal Problem:   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   Weight as of this encounter: 68 kg. Advance diet Up with therapy    DVT Prophylaxis -  Eliquis TDWB RLE Hemovac drain  left in place - 390 cc out yesterday  ABLA: s/p 2 units PRBC  yesterday - hgb stable at 8.3 today. Will give TXA while inpatient.  Infectious disease consulted - appreciate their assistance Will be treated empirically - no cultures taken  PICC to be placed  Up with PT to practice TDWB. Patient has concerns about getting in/out of bed and up/down from seated position.   Goal is for discharge home by Sunday.    Dennie Bible, PA-C Orthopedic Surgery [phone] 06/07/2022, 8:12 AM

## 2022-06-07 NOTE — Plan of Care (Signed)
Plan of care reviewed and discussed. °

## 2022-06-07 NOTE — Progress Notes (Signed)
PHARMACY CONSULT NOTE FOR:  OUTPATIENT  PARENTERAL ANTIBIOTIC THERAPY (OPAT)  Indication: R Hip PJI Regimen:  Daptomycin 500mg  IV q24h Ceftriaxone 2gm IV q24h End date: 07/19/2022  IV antibiotic discharge orders are pended. To discharging provider:  please sign these orders via discharge navigator,  Select New Orders & click on the button choice - Manage This Unsigned Work.     Thank you for allowing pharmacy to be a part of this patient's care.  09/17/2022, PharmD, BCPS, BCIDP Work Cell: [phone] 06/07/2022 1:36 PM

## 2022-06-07 NOTE — Anesthesia Postprocedure Evaluation (Signed)
Anesthesia Post Note  Patient: LUCIA MCCREADIE  Procedure(s) Performed: EXCISIONAL TOTAL HIP ARTHROPLASTY WITH ANTIBIOTIC SPACERS (Right: Hip)     Patient location during evaluation: PACU Anesthesia Type: General Level of consciousness: awake and alert Pain management: pain level controlled Vital Signs Assessment: post-procedure vital signs reviewed and stable Respiratory status: spontaneous breathing, nonlabored ventilation and respiratory function stable Cardiovascular status: blood pressure returned to baseline and stable Postop Assessment: no apparent nausea or vomiting Anesthetic complications: no Comments: Skin tear on right cheek and superficial abrasion right eye lid despite paper tape. Vasoline applied by PACU nurse  No notable events documented.  Last Vitals:  Vitals:   06/07/22 0539 06/07/22 0924  BP: (!) 131/53 (!) 154/68  Pulse: 66 69  Resp: 18 17  Temp: 36.7 C 36.5 C  SpO2: 97% 99%    Last Pain:  Vitals:   06/07/22 1057  TempSrc:   PainSc: 4                  Aimy Sweeting

## 2022-06-07 NOTE — Progress Notes (Addendum)
Pharmacy Antibiotic Note  Travis Wolfe is a 87 y.o. male admitted on 06/06/2022 with hip PJI.  Pharmacy has been consulted for vancomycin dosing.   Patient had THA removed and spacer implanted 12/21.  No cultures collected.    Today, 06/07/2022 - renal: SCr stable - no culture info  Plan: Vancomycin 1500mg  IV x 1 then 1gm IV q24h Goal AUC 400-600, Trough ~15 Estimated AUC 499, Trough = 13 using SCr 1.18 ADDEDNUM:  - plan is to use daptomycin (not vancomycin) - cost checked with home infusion agency and patient reportedly ok with cost - daptomycin 500mg  IV q24h - baseline CK was 199 (NOT on statin therapy)  Ceftriaxone 2gm IV q24h Follow renal function closely Check trough at steady state if remains hospitalized but likely will be discharged prior to this Await OPAT PICC line ordered  Height: 5\' 8"  (172.7 cm) Weight: 68 kg (150 lb) IBW/kg (Calculated) : 68.4  Temp (24hrs), Avg:98 F (36.7 C), Min:97.7 F (36.5 C), Max:98.3 F (36.8 C)  Recent Labs  Lab 06/03/22 1140 06/07/22 0343  WBC 10.5 13.4*  CREATININE 1.02 1.18    Estimated Creatinine Clearance: 44 mL/min (by C-G formula based on SCr of 1.18 mg/dL).    Allergies  Allergen Reactions   Tape Dermatitis and Rash    ONLY use Paper tape   Other     Cat dander   Doxycycline Rash   Horse Epithelium Rash    Antimicrobials this admission: 12/21 cefazolin periop 12/22 vanco 12/22 ceftriaxone  Dose adjustments this admission:   Microbiology results: none  Thank you for allowing pharmacy to be a part of this patient's care.  1/22, PharmD, BCPS, BCIDP Work Cell: [phone] 06/07/2022 10:27 AM

## 2022-06-07 NOTE — Plan of Care (Signed)

## 2022-06-07 NOTE — Progress Notes (Signed)
PHARMACY CONSULT NOTE FOR:  OUTPATIENT  PARENTERAL ANTIBIOTIC THERAPY (OPAT)  Indication: R Hip PJI Regimen:  Daptomycin 500mg IV q24h Ceftriaxone 2gm IV q24h End date: 07/19/2022  -Labs - Once weekly:  CBC/D, CMP, CPK, ESR and CRP -Please leave PIC in place until doctor has seen patient or been notified -Fax weekly labs to [phone]  IV antibiotic discharge orders are pended. To discharging provider:  please sign these orders via discharge navigator,  Select New Orders & click on the button choice - Manage This Unsigned Work.     Thank you for allowing pharmacy to be a part of this patient's care.   , PharmD, BCPS, BCIDP Work Cell: [phone] 06/07/2022 1:53 PM    

## 2022-06-08 LAB — PREPARE FRESH FROZEN PLASMA
Unit division: 0
Unit division: 0

## 2022-06-08 LAB — BPAM FFP
Blood Product Expiration Date: 202312221248
Blood Product Expiration Date: 202312221248
Unit Type and Rh: 6200
Unit Type and Rh: 6200

## 2022-06-08 LAB — CREATININE, SERUM
Creatinine, Ser: 1.27 mg/dL — ABNORMAL HIGH (ref 0.61–1.24)
GFR, Estimated: 55 mL/min — ABNORMAL LOW (ref >60)

## 2022-06-08 LAB — CBC
HCT: 23.6 % — ABNORMAL LOW (ref 39.0–52.0)
Hemoglobin: 7.8 g/dL — ABNORMAL LOW (ref 13.0–17.0)
MCH: 31.8 pg (ref 26.0–34.0)
MCHC: 33.1 g/dL (ref 30.0–36.0)
MCV: 96.3 fL (ref 80.0–100.0)
Platelets: 178 10*3/uL (ref 150–400)
RBC: 2.45 MIL/uL — ABNORMAL LOW (ref 4.22–5.81)
RDW: 14.7 % (ref 11.5–15.5)
WBC: 15.3 10*3/uL — ABNORMAL HIGH (ref 4.0–10.5)
nRBC: 0 % (ref 0.0–0.2)

## 2022-06-08 MED ORDER — CHLORHEXIDINE GLUCONATE CLOTH 2 % EX PADS
6.0000 | MEDICATED_PAD | Freq: Every day | CUTANEOUS | Status: DC
  Administered 2022-06-09 – 2022-06-12 (×4): 6 via TOPICAL

## 2022-06-08 MED ORDER — SODIUM CHLORIDE 0.9% FLUSH: 10.0000 mL | INTRAVENOUS | Status: DC | PRN

## 2022-06-08 MED ORDER — CHLORHEXIDINE GLUCONATE CLOTH 2 % EX PADS: 6.0000 | MEDICATED_PAD | Freq: Every day | CUTANEOUS | Status: DC

## 2022-06-08 MED ORDER — SODIUM CHLORIDE 0.9% FLUSH
10.0000 mL | Freq: Two times a day (BID) | INTRAVENOUS | Status: DC
  Administered 2022-06-09 – 2022-06-10 (×2): 10 mL

## 2022-06-08 NOTE — Progress Notes (Signed)
Physical Therapy Treatment Patient Details Name: Travis Wolfe MRN: [id number] DOB: 1934-09-10 Today's Date: 06/08/2022   History of Present Illness Pt is an 87 year old male s/p R THR resection with static spacer placement. PMHx: s/p IRRIGATION AND DEBRIDEMENT RIGHT HIP, EVACUATION OF PSEUDOTUMOR on 05/17/21, R THA with history of infection and I&D, PAF, falls, HLD, HTN, lumbar fusion, kyphoplasty, L RTC repair    PT Comments    Pt continues very cooperative but limited this pm by fatigue.  Pt up from chair to pvt on L LE with RW but unable to sufficiently WB on UEs to maintain TTWB on R LE and step bkwds with L LE - bed pulled up behind pt to achieve safe landing.     Recommendations for follow up therapy are one component of a multi-disciplinary discharge planning process, led by the attending physician.  Recommendations may be updated based on patient status, additional functional criteria and insurance authorization.  Follow Up Recommendations  Follow physician's recommendations for discharge plan and follow up therapies     Assistance Recommended at Discharge Frequent or constant Supervision/Assistance  Patient can return home with the following Two people to help with walking and/or transfers;A lot of help with bathing/dressing/bathroom;Assistance with cooking/housework;Assist for transportation;Help with stairs or ramp for entrance   Equipment Recommendations  None recommended by PT    Recommendations for Other Services OT consult     Precautions / Restrictions Precautions Precautions: Fall Restrictions Weight Bearing Restrictions: Yes RLE Weight Bearing: Touchdown weight bearing     Mobility  Bed Mobility Overal bed mobility: Needs Assistance Bed Mobility: Sit to Supine     Supine to sit: Min assist, Mod assist, +2 for physical assistance, +2 for safety/equipment Sit to supine: Mod assist, +2 for physical assistance, +2 for safety/equipment   General bed  mobility comments: INcreased time with cues for sequence, use of L LE to self assist and physical assist to manage R LE, to control trunk and to rotate pt from EOB with bed pad    Transfers Overall transfer level: Needs assistance Equipment used: Rolling walker (2 wheels) Transfers: Sit to/from Stand, Bed to chair/wheelchair/BSC Sit to Stand: From elevated surface, Mod assist, +2 physical assistance, +2 safety/equipment   Step pivot transfers: Min assist, +2 physical assistance, +2 safety/equipment, From elevated surface       General transfer comment: Cues for LE management and use of UEs to self assist.  Physical assist to bring wt up and fwd and to balance in standing with RW; step pvt recliner to bed.  Pt unable to step back to bedside and bed pulled up behind hime    Ambulation/Gait Ambulation/Gait assistance: +2 physical assistance, +2 safety/equipment, Min assist, Mod assist Gait Distance (Feet): 3 Feet Assistive device: Rolling walker (2 wheels) Gait Pattern/deviations: Step-through pattern, Decreased stride length Gait velocity: decr     General Gait Details: Pt performed step pvt largely rotating on unaffected LE with RW.  Pt unable to step back to bedside and bed pulled in behind   Stairs             Wheelchair Mobility    Modified Rankin (Stroke Patients Only)       Balance Overall balance assessment: Needs assistance Sitting-balance support: Bilateral upper extremity supported, Feet supported Sitting balance-Leahy Scale: Poor Sitting balance - Comments: posterior lean   Standing balance support: Bilateral upper extremity supported Standing balance-Leahy Scale: Poor Standing balance comment: very unsteady with cues for UE WB on RW  Cognition Arousal/Alertness: Awake/alert Behavior During Therapy: WFL for tasks assessed/performed Overall Cognitive Status: Within Functional Limits for tasks assessed                                           Exercises General Exercises - Lower Extremity Ankle Circles/Pumps: AROM, Both, 15 reps, Supine Gluteal Sets: AROM, Both, Supine, 5 reps    General Comments        Pertinent Vitals/Pain Pain Assessment Pain Assessment: 0-10 Pain Score: 6  Pain Location: R hip Pain Descriptors / Indicators: Sore, Guarding Pain Intervention(s): Limited activity within patient's tolerance, Monitored during session, Premedicated before session, Ice applied    Home Living                          Prior Function            PT Goals (current goals can now be found in the care plan section) Acute Rehab PT Goals Patient Stated Goal: HOME PT Goal Formulation: With patient Time For Goal Achievement: 06/20/22 Potential to Achieve Goals: Fair Progress towards PT goals: Not progressing toward goals - comment (limited by fatigue)    Frequency    7X/week      PT Plan Current plan remains appropriate    Co-evaluation              AM-PAC PT "6 Clicks" Mobility   Outcome Measure  Help needed turning from your back to your side while in a flat bed without using bedrails?: A Lot Help needed moving from lying on your back to sitting on the side of a flat bed without using bedrails?: A Lot Help needed moving to and from a bed to a chair (including a wheelchair)?: A Lot Help needed standing up from a chair using your arms (e.g., wheelchair or bedside chair)?: A Lot Help needed to walk in hospital room?: Total Help needed climbing 3-5 steps with a railing? : Total 6 Click Score: 10    End of Session Equipment Utilized During Treatment: Gait belt Activity Tolerance: Patient limited by fatigue;Patient limited by pain Patient left: in bed;with call bell/phone within reach;with nursing/sitter in room Nurse Communication: Mobility status PT Visit Diagnosis: Difficulty in walking, not elsewhere classified (R26.2);Pain Pain - Right/Left:  Right Pain - part of body: Hip     Time: 1245-1301 PT Time Calculation (min) (ACUTE ONLY): 16 min  Charges:  $Therapeutic Activity: 8-22 mins                     Debe Coder PT Acute Rehabilitation Services Pager [phone] Office [phone]    Selin Eisler 06/08/2022, 4:30 PM

## 2022-06-08 NOTE — Progress Notes (Signed)
Peripherally Inserted Central Catheter Placement  The IV Nurse has discussed with the patient and/or persons authorized to consent for the patient, the purpose of this procedure and the potential benefits and risks involved with this procedure.  The benefits include less needle sticks, lab draws from the catheter, and the patient may be discharged home with the catheter. Risks include, but not limited to, infection, bleeding, blood clot (thrombus formation), and puncture of an artery; nerve damage and irregular heartbeat and possibility to perform a PICC exchange if needed/ordered by physician.  Alternatives to this procedure were also discussed.  Bard Power PICC patient education guide, fact sheet on infection prevention and patient information card has been provided to patient /or left at bedside.    PICC Placement Documentation  PICC Single Lumen 06/08/22 Right Brachial 36 cm 0 cm (Active)  Indication for Insertion or Continuance of Line Poor Vasculature-patient has had multiple peripheral attempts or PIVs lasting less than 24 hours;Limited venous access - need for IV therapy >5 days (PICC only) 06/08/22 1417  Exposed Catheter (cm) 0 cm 06/08/22 1417  Site Assessment Clean, Dry, Intact 06/08/22 1417  Line Status Flushed;Saline locked;Blood return noted 06/08/22 1417  Dressing Type Transparent;Securing device 06/08/22 1417  Dressing Status Antimicrobial disc in place;Clean, Dry, Intact 06/08/22 1417  Safety Lock Not Applicable 06/08/22 1417  Line Care Connections checked and tightened 06/08/22 1417  Line Adjustment (NICU/IV Team Only) No 06/08/22 1417  Dressing Intervention New dressing 06/08/22 1417  Dressing Change Due 06/15/22 06/08/22 1417       Elliot Dally 06/08/2022, 2:18 PM

## 2022-06-08 NOTE — Plan of Care (Signed)
  Problem: Pain Management: Goal: Pain level will decrease with appropriate interventions Outcome: Progressing   Problem: Coping: Goal: Level of anxiety will decrease Outcome: Progressing   

## 2022-06-08 NOTE — Progress Notes (Signed)
Physical Therapy Treatment Patient Details Name: Travis Wolfe MRN: [id number] DOB: September 21, 1934 Today's Date: 06/08/2022   History of Present Illness Pt is an 87 year old male s/p R THR resection with static spacer placement. PMHx: s/p IRRIGATION AND DEBRIDEMENT RIGHT HIP, EVACUATION OF PSEUDOTUMOR on 05/17/21, R THA with history of infection and I&D, PAF, falls, HLD, HTN, lumbar fusion, kyphoplasty, L RTC repair    PT Comments    Pt very motivated and reporting much improved pain control this am.  Pt continues to require increased time and but decreased assist of two for all basic mobility tasks.  Pt up to EOB sitting with decreased L lean, assisted to standing from elevated bed but decreased assist to bring wt up and fwd; ambulated very short distance with good awareness and follow through with TTWB.  Pt reports he prefers not to receive HHPT and that his wife and he will be ok since he has had 6 prior hip surgeries.   Recommendations for follow up therapy are one component of a multi-disciplinary discharge planning process, led by the attending physician.  Recommendations may be updated based on patient status, additional functional criteria and insurance authorization.  Follow Up Recommendations  Follow physician's recommendations for discharge plan and follow up therapies     Assistance Recommended at Discharge Frequent or constant Supervision/Assistance  Patient can return home with the following Two people to help with walking and/or transfers;A lot of help with bathing/dressing/bathroom;Assistance with cooking/housework;Assist for transportation;Help with stairs or ramp for entrance   Equipment Recommendations  None recommended by PT (discussed benefit of lift chair for pt in current circumstance)    Recommendations for Other Services       Precautions / Restrictions Precautions Precautions: Fall Restrictions Weight Bearing Restrictions: Yes RLE Weight Bearing: Touchdown  weight bearing     Mobility  Bed Mobility Overal bed mobility: Needs Assistance Bed Mobility: Supine to Sit     Supine to sit: Min assist, Mod assist, +2 for physical assistance, +2 for safety/equipment     General bed mobility comments: INcreased time with cues for sequence, use of L LE to self assist and physical assist to manage R LE, to control trunk and to rotate pt to EOB with bed pad    Transfers Overall transfer level: Needs assistance Equipment used: Rolling walker (2 wheels) Transfers: Sit to/from Stand, Bed to chair/wheelchair/BSC Sit to Stand: From elevated surface, Mod assist, +2 physical assistance, +2 safety/equipment   Step pivot transfers: Min assist, +2 physical assistance, +2 safety/equipment, From elevated surface       General transfer comment: extensive use of bed with physical assist to bring wt up and fwd and to balance in standing with RW; step pvt bed to recliner    Ambulation/Gait Ambulation/Gait assistance: Min assist, +2 physical assistance, +2 safety/equipment Gait Distance (Feet): 3 Feet Assistive device: Rolling walker (2 wheels) Gait Pattern/deviations: Step-through pattern, Decreased stride length Gait velocity: decr     General Gait Details: cues for posture, position from RW and sequence;  Pt demonstrating good awareness and follow through with TTWB   Stairs             Wheelchair Mobility    Modified Rankin (Stroke Patients Only)       Balance Overall balance assessment: Needs assistance Sitting-balance support: Bilateral upper extremity supported, Feet supported Sitting balance-Leahy Scale: Poor Sitting balance - Comments: posterior lean   Standing balance support: Bilateral upper extremity supported Standing balance-Leahy Scale: Poor  Cognition Arousal/Alertness: Awake/alert Behavior During Therapy: WFL for tasks assessed/performed Overall Cognitive Status: Within  Functional Limits for tasks assessed                                          Exercises General Exercises - Lower Extremity Ankle Circles/Pumps: AROM, Both, 15 reps, Supine Gluteal Sets: AROM, Both, Supine, 5 reps    General Comments        Pertinent Vitals/Pain Pain Assessment Pain Assessment: 0-10 Pain Score: 5  Pain Location: R hip Pain Descriptors / Indicators: Sore, Guarding Pain Intervention(s): Limited activity within patient's tolerance, Monitored during session, Premedicated before session, Ice applied    Home Living                          Prior Function            PT Goals (current goals can now be found in the care plan section) Acute Rehab PT Goals Patient Stated Goal: HOME PT Goal Formulation: With patient Time For Goal Achievement: 06/20/22 Potential to Achieve Goals: Fair Progress towards PT goals: Progressing toward goals    Frequency    7X/week      PT Plan Current plan remains appropriate    Co-evaluation              AM-PAC PT "6 Clicks" Mobility   Outcome Measure  Help needed turning from your back to your side while in a flat bed without using bedrails?: A Lot Help needed moving from lying on your back to sitting on the side of a flat bed without using bedrails?: A Lot Help needed moving to and from a bed to a chair (including a wheelchair)?: A Lot Help needed standing up from a chair using your arms (e.g., wheelchair or bedside chair)?: A Lot Help needed to walk in hospital room?: Total Help needed climbing 3-5 steps with a railing? : Total 6 Click Score: 10    End of Session Equipment Utilized During Treatment: Gait belt Activity Tolerance: Patient tolerated treatment well;Patient limited by fatigue Patient left: in chair;with call bell/phone within reach;with chair alarm set;with nursing/sitter in room Nurse Communication: Mobility status PT Visit Diagnosis: Difficulty in walking, not elsewhere  classified (R26.2);Pain Pain - Right/Left: Right Pain - part of body: Hip     Time: 1035-1100 PT Time Calculation (min) (ACUTE ONLY): 25 min  Charges:  $Gait Training: 8-22 mins $Therapeutic Activity: 8-22 mins                     Mauro Kaufmann PT Acute Rehabilitation Services Pager [phone] Office [phone]    Natoria Archibald 06/08/2022, 4:12 PM

## 2022-06-08 NOTE — Progress Notes (Signed)
Subjective: 2 Days Post-Op Procedure(s) (LRB): EXCISIONAL TOTAL HIP ARTHROPLASTY WITH ANTIBIOTIC SPACERS (Right) Patient reports pain as mild.   Patient seen in rounds for Dr. Charlann Boxer. Patient is well, and has had no acute complaints or problems other than pain in the right hip. Resting comfortably in bed upon rounds, foley catheter removed this morning.   Objective: Vital signs in last 24 hours: Temp:  [97.7 F (36.5 C)-98.5 F (36.9 C)] 98 F (36.7 C) (12/23 0509) Pulse Rate:  [62-64] 62 (12/23 0509) Resp:  [16-18] 16 (12/23 0509) BP: (128-140)/(55-56) 128/56 (12/23 0509) SpO2:  [98 %-99 %] 98 % (12/23 0509)  Intake/Output from previous day:  Intake/Output Summary (Last 24 hours) at 06/08/2022 0943 Last data filed at 06/08/2022 0600 Gross per 24 hour  Intake 1963.49 ml  Output 1370 ml  Net 593.49 ml    Intake/Output this shift: No intake/output data recorded.  Labs: Recent Labs    06/06/22 1213 06/07/22 0343 06/08/22 0434  HGB 8.5* 8.3* 7.8*   Recent Labs    06/07/22 0343 06/08/22 0434  WBC 13.4* 15.3*  RBC 2.61* 2.45*  HCT 24.8* 23.6*  PLT 166 178   Recent Labs    06/06/22 1213 06/07/22 0343 06/08/22 0434  NA 140 138  --   K 4.5 4.9  --   CL  --  109  --   CO2  --  23  --   BUN  --  21  --   CREATININE  --  1.18 1.27*  GLUCOSE  --  129*  --   CALCIUM  --  7.5*  --    No results for input(s): "LABPT", "INR" in the last 72 hours.  Exam: General - Patient is Alert and Oriented Extremity - Neurologically intact Neurovascular intact Sensation intact distally Dorsiflexion/Plantar flexion intact Dressing/Incision - clean, dry, no drainage Motor Function - intact, moving foot and toes well on exam.   Past Medical History:  Diagnosis Date   A-fib (HCC)    REPORTS WAS DUE TO DEHYDRATION 3 YEARS AGO BUT WAS GIVEN FLUIDS IN ED AND MEDS TO SLOW HR AND DROVE HIMSELF HOME  ; HAD ABLATION X2 WITH CARDIO DR. AKBARY AS PRECAUTION  ; has been on eliquis  since but reports he has not taken it for 5 weeks b/c he was told to hold it for the multiple irrigations and debridements for the hip infection. reports his cardiologist is aware he has been holding it;    Arthritis    Atherosclerosis of both carotid arteries    BPH (benign prostatic hyperplasia)    Dysrhythmia    a-fib   Fall    fell on friday 01-16-18;   lost balAnce whIile tryng to get his sock on ;sustained skin tear to left wrist ; no drainage  , scabbed over , and has been covering with bandage    Heart murmur    SINCE HIS 20s   HLD (hyperlipidemia)    Hypertension    Non-rheumatic mitral regurgitation     Assessment/Plan: 2 Days Post-Op Procedure(s) (LRB): EXCISIONAL TOTAL HIP ARTHROPLASTY WITH ANTIBIOTIC SPACERS (Right) Principal Problem:   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   Weight as of this encounter: 68 kg. Up with therapy  DVT Prophylaxis - Eliquis TDWB  Possible discharge Sunday   Friday, PA-C Orthopedic Surgery [phone])  [phone] 06/08/2022, 9:43 AM

## 2022-06-09 ENCOUNTER — Inpatient Hospital Stay (HOSPITAL_COMMUNITY): Payer: Medicare PPO

## 2022-06-09 LAB — CBC
HCT: 25.6 % — ABNORMAL LOW (ref 39.0–52.0)
Hemoglobin: 8.2 g/dL — ABNORMAL LOW (ref 13.0–17.0)
MCH: 31.9 pg (ref 26.0–34.0)
MCHC: 32 g/dL (ref 30.0–36.0)
MCV: 99.6 fL (ref 80.0–100.0)
Platelets: 195 10*3/uL (ref 150–400)
RBC: 2.57 MIL/uL — ABNORMAL LOW (ref 4.22–5.81)
RDW: 14.7 % (ref 11.5–15.5)
WBC: 10.1 10*3/uL (ref 4.0–10.5)
nRBC: 0 % (ref 0.0–0.2)

## 2022-06-09 MED ORDER — FERROUS SULFATE 325 (65 FE) MG PO TABS
325.0000 mg | ORAL_TABLET | Freq: Two times a day (BID) | ORAL | Status: DC
  Administered 2022-06-09 – 2022-06-12 (×7): 325 mg via ORAL
  Filled 2022-06-09 (×7): qty 1

## 2022-06-09 MED ORDER — SODIUM CHLORIDE 0.9 % IV SOLN: INTRAVENOUS | Status: DC | PRN

## 2022-06-09 MED ORDER — ORAL CARE MOUTH RINSE: 15.0000 mL | OROMUCOSAL | Status: DC | PRN

## 2022-06-09 NOTE — Plan of Care (Signed)
  Problem: Activity: Goal: Ability to avoid complications of mobility impairment will improve Outcome: Progressing   Problem: Pain Management: Goal: Pain level will decrease with appropriate interventions Outcome: Progressing   

## 2022-06-09 NOTE — Plan of Care (Signed)
  Problem: Pain Management: Goal: Pain level will decrease with appropriate interventions Outcome: Progressing   Problem: Coping: Goal: Level of anxiety will decrease Outcome: Progressing   

## 2022-06-09 NOTE — Progress Notes (Signed)
Patient called out, reports he had fallen asleep and woke up in severe pain, the worst since surgery. Reports pain deep in right hip and indescribable, worse w/ any movement. Administered po robaxin, repositioned x2 staff. Per therapy, patient's right leg has been markedly shorter than left since surgery but patient states he feels like it's shorter than usual and feels a "clicking" when he moves it. Color normal, pulse strong. On call MD notified, rec'd order for pelvic xray. Will treat pain and monitor.

## 2022-06-09 NOTE — Progress Notes (Signed)
Physical Therapy Treatment Patient Details Name: Travis Wolfe MRN: [id number] DOB: 1935/04/22 Today's Date: 06/09/2022   History of Present Illness Pt is an 87 year old male s/p R THR resection with static spacer placement. PMHx: s/p IRRIGATION AND DEBRIDEMENT RIGHT HIP, EVACUATION OF PSEUDOTUMOR on 05/17/21, R THA with history of infection and I&D, PAF, falls, HLD, HTN, lumbar fusion, kyphoplasty, L RTC repair    PT Comments    Pt continues very cooperative but limited this pm by fatigue and requiring marked increase in assist level to stand from recliner, pvt with RW and then unable to step back and bed pulled up behind him to land safety.   Recommendations for follow up therapy are one component of a multi-disciplinary discharge planning process, led by the attending physician.  Recommendations may be updated based on patient status, additional functional criteria and insurance authorization.  Follow Up Recommendations  Follow physician's recommendations for discharge plan and follow up therapies     Assistance Recommended at Discharge Frequent or constant Supervision/Assistance  Patient can return home with the following Two people to help with walking and/or transfers;A lot of help with bathing/dressing/bathroom;Assistance with cooking/housework;Assist for transportation;Help with stairs or ramp for entrance   Equipment Recommendations  None recommended by PT    Recommendations for Other Services OT consult     Precautions / Restrictions Precautions Precautions: Fall Precaution Comments: wound vac Restrictions Weight Bearing Restrictions: Yes RLE Weight Bearing: Touchdown weight bearing     Mobility  Bed Mobility Overal bed mobility: Needs Assistance Bed Mobility: Sit to Supine     Supine to sit: Min assist, Mod assist, +2 for physical assistance, +2 for safety/equipment Sit to supine: +2 for physical assistance, +2 for safety/equipment, Mod assist, Max assist    General bed mobility comments: INcreased time with cues for sequence, use of L LE to self assist and physical assist to manage R LE, to control trunk and to rotate pt from EOB with bed pad    Transfers Overall transfer level: Needs assistance Equipment used: Rolling walker (2 wheels) Transfers: Sit to/from Stand, Bed to chair/wheelchair/BSC Sit to Stand: From elevated surface, +2 physical assistance, +2 safety/equipment, Mod assist Stand pivot transfers: Mod assist, +2 physical assistance, +2 safety/equipment, From elevated surface Step pivot transfers: Min assist, +2 physical assistance, +2 safety/equipment, From elevated surface       General transfer comment: Cues for LE management and use of UEs to self assist.  Physical assist to bring wt up and fwd and to balance in standing with RW; Stand pvt recliner to bed.  Pt unable to step back to bedside and bed pulled up behind hime    Ambulation/Gait Ambulation/Gait assistance: Min assist, +2 safety/equipment Gait Distance (Feet): 6 Feet Assistive device: Rolling walker (2 wheels) Gait Pattern/deviations: Step-to pattern, Decreased step length - right, Decreased step length - left, Shuffle, Trunk flexed Gait velocity: decr     General Gait Details: Pt unable to WB sufficiently on UEs to step.  Pt performed stand pvt with RW and mod assist and bed pulled up behind to achieve safe landing.   Stairs             Wheelchair Mobility    Modified Rankin (Stroke Patients Only)       Balance Overall balance assessment: Needs assistance Sitting-balance support: Bilateral upper extremity supported, Feet supported Sitting balance-Leahy Scale: Poor Sitting balance - Comments: posterior lean   Standing balance support: Bilateral upper extremity supported Standing balance-Leahy Scale: Poor  Standing balance comment: very unsteady with cues for UE WB on RW                            Cognition Arousal/Alertness:  Awake/alert Behavior During Therapy: WFL for tasks assessed/performed Overall Cognitive Status: Within Functional Limits for tasks assessed                                          Exercises General Exercises - Lower Extremity Ankle Circles/Pumps: AROM, Both, 15 reps, Supine Gluteal Sets: AROM, Both, Supine, 5 reps    General Comments        Pertinent Vitals/Pain Pain Assessment Pain Assessment: 0-10 Pain Score: 6  Pain Location: R hip Pain Descriptors / Indicators: Sore, Guarding, Grimacing Pain Intervention(s): Limited activity within patient's tolerance, Premedicated before session, Monitored during session    Home Living                          Prior Function            PT Goals (current goals can now be found in the care plan section) Acute Rehab PT Goals Patient Stated Goal: HOME PT Goal Formulation: With patient Time For Goal Achievement: 06/20/22 Potential to Achieve Goals: Fair Progress towards PT goals: Not progressing toward goals - comment (ltd this pm by fatigue)    Frequency    7X/week      PT Plan Current plan remains appropriate    Co-evaluation              AM-PAC PT "6 Clicks" Mobility   Outcome Measure  Help needed turning from your back to your side while in a flat bed without using bedrails?: A Lot Help needed moving from lying on your back to sitting on the side of a flat bed without using bedrails?: A Lot Help needed moving to and from a bed to a chair (including a wheelchair)?: A Lot Help needed standing up from a chair using your arms (e.g., wheelchair or bedside chair)?: A Lot Help needed to walk in hospital room?: Total Help needed climbing 3-5 steps with a railing? : Total 6 Click Score: 10    End of Session Equipment Utilized During Treatment: Gait belt Activity Tolerance: Patient limited by fatigue Patient left: in bed;with call bell/phone within reach;with bed alarm set;with nursing/sitter  in room Nurse Communication: Mobility status PT Visit Diagnosis: Difficulty in walking, not elsewhere classified (R26.2);Pain Pain - Right/Left: Right Pain - part of body: Hip     Time: 1345-1404 PT Time Calculation (min) (ACUTE ONLY): 19 min  Charges:  $Gait Training: 8-22 mins $Therapeutic Activity: 8-22 mins                     Mauro Kaufmann PT Acute Rehabilitation Services Pager [phone] Office [phone]    Travis Wolfe 06/09/2022, 2:29 PM

## 2022-06-09 NOTE — Progress Notes (Signed)
Patient ID: METRO EDENFIELD, male   DOB: September 14, 1934, 87 y.o.   MRN: [id number] Subjective: 3 Days Post-Op Procedure(s) (LRB): EXCISIONAL TOTAL HIP ARTHROPLASTY WITH ANTIBIOTIC SPACERS (Right)    Patient reports pain as moderate. PT notes reviewed  Objective:   VITALS:   Vitals:   06/08/22 2148 06/09/22 0555  BP: 132/60 (!) 142/51  Pulse: 64 63  Resp: 18 18  Temp: 98.2 F (36.8 C) 97.7 F (36.5 C)  SpO2: 99% 100%    Neurovascular intact Incision: dressing C/D/I Drain still with significant output  LABS Recent Labs    06/06/22 1213 06/07/22 0343 06/08/22 0434  HGB 8.5* 8.3* 7.8*  HCT 25.0* 24.8* 23.6*  WBC  --  13.4* 15.3*  PLT  --  166 178    Recent Labs    06/06/22 1213 06/07/22 0343 06/08/22 0434  NA 140 138  --   K 4.5 4.9  --   BUN  --  21  --   CREATININE  --  1.18 1.27*  GLUCOSE  --  129*  --     No results for input(s): "LABPT", "INR" in the last 72 hours.   Assessment/Plan: 3 Days Post-Op Procedure(s) (LRB): EXCISIONAL TOTAL HIP ARTHROPLASTY WITH ANTIBIOTIC SPACERS (Right)   Up with therapy I reordered a CBC this am to follow up with his periop ABLA Desire is to go home but he is not ready at this point Further dialogue with PT and home needs with his very nice and attentive wife will need to be had Maintain his HV drain while here to prevent post op seroma formation  ABLA - will follow up with his CBC to determine further needs Started him on TXA to help with post op oozing IRON supplement

## 2022-06-09 NOTE — Progress Notes (Signed)
Physical Therapy Treatment Patient Details Name: Travis Wolfe MRN: [id number] DOB: 1934-08-08 Today's Date: 06/09/2022   History of Present Illness Pt is an 87 year old male s/p R THR resection with static spacer placement. PMHx: s/p IRRIGATION AND DEBRIDEMENT RIGHT HIP, EVACUATION OF PSEUDOTUMOR on 05/17/21, R THA with history of infection and I&D, PAF, falls, HLD, HTN, lumbar fusion, kyphoplasty, L RTC repair    PT Comments    Pt continues very motivated and progressing this morning with all mobility tasks including ambulating increased distance in room and requiring decreased assist for most mobility tasks.   Recommendations for follow up therapy are one component of a multi-disciplinary discharge planning process, led by the attending physician.  Recommendations may be updated based on patient status, additional functional criteria and insurance authorization.  Follow Up Recommendations  Follow physician's recommendations for discharge plan and follow up therapies     Assistance Recommended at Discharge Frequent or constant Supervision/Assistance  Patient can return home with the following Two people to help with walking and/or transfers;A lot of help with bathing/dressing/bathroom;Assistance with cooking/housework;Assist for transportation;Help with stairs or ramp for entrance   Equipment Recommendations  None recommended by PT    Recommendations for Other Services OT consult     Precautions / Restrictions Precautions Precautions: Fall Precaution Comments: wound vac Restrictions Weight Bearing Restrictions: Yes RLE Weight Bearing: Touchdown weight bearing     Mobility  Bed Mobility Overal bed mobility: Needs Assistance Bed Mobility: Supine to Sit     Supine to sit: Min assist, Mod assist, +2 for physical assistance, +2 for safety/equipment     General bed mobility comments: INcreased time with cues for sequence, use of L LE to self assist and physical assist to  manage R LE, to control trunk and to rotate pt to EOB with bed pad    Transfers Overall transfer level: Needs assistance Equipment used: Rolling walker (2 wheels) Transfers: Sit to/from Stand, Bed to chair/wheelchair/BSC Sit to Stand: From elevated surface, +2 physical assistance, +2 safety/equipment, Min assist, Mod assist   Step pivot transfers: Min assist, +2 physical assistance, +2 safety/equipment, From elevated surface       General transfer comment: Cues for LE management and use of UEs to self assist.  Physical assist to bring wt up and fwd and to balance in standing with RW; step pvt recliner to bed.  Pt unable to step back to bedside and bed pulled up behind hime    Ambulation/Gait Ambulation/Gait assistance: Min assist, +2 safety/equipment Gait Distance (Feet): 6 Feet Assistive device: Rolling walker (2 wheels) Gait Pattern/deviations: Step-to pattern, Decreased step length - right, Decreased step length - left, Shuffle, Trunk flexed Gait velocity: decr     General Gait Details: Cues for sequence, posture and position from RW with good awareness and follow though with TTWB   Stairs             Wheelchair Mobility    Modified Rankin (Stroke Patients Only)       Balance Overall balance assessment: Needs assistance Sitting-balance support: Bilateral upper extremity supported, Feet supported Sitting balance-Leahy Scale: Poor Sitting balance - Comments: posterior lean   Standing balance support: Bilateral upper extremity supported Standing balance-Leahy Scale: Poor Standing balance comment: very unsteady with cues for UE WB on RW                            Cognition Arousal/Alertness: Awake/alert Behavior During Therapy: [facility]  for tasks assessed/performed Overall Cognitive Status: Within Functional Limits for tasks assessed                                          Exercises General Exercises - Lower Extremity Ankle  Circles/Pumps: AROM, Both, 15 reps, Supine Gluteal Sets: AROM, Both, Supine, 5 reps    General Comments        Pertinent Vitals/Pain Pain Assessment Pain Assessment: 0-10 Pain Score: 5  Pain Location: R hip Pain Descriptors / Indicators: Sore, Guarding, Grimacing Pain Intervention(s): Limited activity within patient's tolerance, Monitored during session, Premedicated before session, Ice applied    Home Living                          Prior Function            PT Goals (current goals can now be found in the care plan section) Acute Rehab PT Goals Patient Stated Goal: HOME PT Goal Formulation: With patient Time For Goal Achievement: 06/20/22 Potential to Achieve Goals: Fair Progress towards PT goals: Progressing toward goals    Frequency    7X/week      PT Plan Current plan remains appropriate    Co-evaluation              AM-PAC PT "6 Clicks" Mobility   Outcome Measure  Help needed turning from your back to your side while in a flat bed without using bedrails?: A Lot Help needed moving from lying on your back to sitting on the side of a flat bed without using bedrails?: A Lot Help needed moving to and from a bed to a chair (including a wheelchair)?: A Lot Help needed standing up from a chair using your arms (e.g., wheelchair or bedside chair)?: A Lot Help needed to walk in hospital room?: Total Help needed climbing 3-5 steps with a railing? : Total 6 Click Score: 10    End of Session Equipment Utilized During Treatment: Gait belt Activity Tolerance: Patient tolerated treatment well Patient left: in chair;with call bell/phone within reach;with chair alarm set;with family/visitor present Nurse Communication: Mobility status PT Visit Diagnosis: Difficulty in walking, not elsewhere classified (R26.2);Pain Pain - Right/Left: Right Pain - part of body: Hip     Time: 9450-3888 PT Time Calculation (min) (ACUTE ONLY): 21 min  Charges:  $Gait  Training: 8-22 mins                     Mauro Kaufmann PT Acute Rehabilitation Services Pager [phone] Office [phone]    Haydin Calandra 06/09/2022, 2:19 PM

## 2022-06-10 NOTE — Progress Notes (Signed)
Subjective: 4 Days Post-Op Procedure(s) (LRB): EXCISIONAL TOTAL HIP ARTHROPLASTY WITH ANTIBIOTIC SPACERS (Right)  Patient reports pain as moderate to severe.  Informs me that after working with therapy yesterday he was laying in bed and had the worst pain of his life in his R hip.  He notes that he feels a clicking sensation in his hip that's new.  He notes that it took a good amount of morphine for him to be able to sleep 5 hours last night.  Denies fever, chills, N/V, CP, SOB, or light headedness. Tolerating POs well.  Admits to flatus. PT also notes slow progress.  Objective:   VITALS:  Temp:  [97.7 F (36.5 C)-98 F (36.7 C)] 97.9 F (36.6 C) (12/25 0508) Pulse Rate:  [59-71] 61 (12/25 0508) Resp:  [16-17] 17 (12/25 0508) BP: (124-147)/(51-65) 124/64 (12/25 0508) SpO2:  [96 %-100 %] 96 % (12/25 0508)  General: WDWN patient in NAD. Psych:  Appropriate mood and affect. Neuro:  A&O x 3, Moving all extremities, sensation intact to light touch HEENT:  EOMs intact Chest:  Even non-labored respirations Skin:  Dressing C/D/I, no rashes or lesions Extremities: warm/dry, mild edema to R hip, no erythema or echymosis.  No lymphadenopathy. Pulses: Popliteus 2+ MSK:  ROM: TKE, MMT: able to perform quad set, (-) Homan's    LABS Recent Labs    06/08/22 0434 06/09/22 0736  HGB 7.8* 8.2*  WBC 15.3* 10.1  PLT 178 195   Recent Labs    06/08/22 0434  CREATININE 1.27*   No results for input(s): "LABPT", "INR" in the last 72 hours.   Assessment/Plan: 4 Days Post-Op Procedure(s) (LRB): EXCISIONAL TOTAL HIP ARTHROPLASTY WITH ANTIBIOTIC SPACERS (Right)  Patient seen in rounds for Dr. Charlann Boxer. Pelvic plain films were obtained yesterday. Demonstrates questionable movement of spacer.  Radiologist notes that this may be positional. Patient will attempt to work with therapy today. Most recent Hgb 8.2 Plan for Dr. Charlann Boxer to evaluate patient and review pelvic plain films tomorrow. HV drain left  in place.  Alfredo Martinez PA-C [facility] Office:  [phone]

## 2022-06-10 NOTE — Plan of Care (Signed)
  Problem: Pain Management: Goal: Pain level will decrease with appropriate interventions Outcome: Progressing   

## 2022-06-10 NOTE — Progress Notes (Addendum)
Physical Therapy Treatment Patient Details Name: Travis Wolfe MRN: [id number] DOB: 09-24-1934 Today's Date: 06/10/2022   History of Present Illness Pt is an 87 year old male s/p R THR resection with static spacer placement. PMHx: s/p IRRIGATION AND DEBRIDEMENT RIGHT HIP, EVACUATION OF PSEUDOTUMOR on 05/17/21, R THA with history of infection and I&D, PAF, falls, HLD, HTN, lumbar fusion, kyphoplasty, L RTC repair    PT Comments    Pt continues motivated but progression with mobility has been limited.  Two days ago pt able to walk limited distance in room while maintaining TWB but has regressed to stand pvts with RW and unable to advance L LE without WB on R LE.  Pt would greatly benefit from follow up at SNF level to maximize IND and safety.  Pt and spouse are in agreement and have requested [facility] be their first consideration.   Recommendations for follow up therapy are one component of a multi-disciplinary discharge planning process, led by the attending physician.  Recommendations may be updated based on patient status, additional functional criteria and insurance authorization.  Follow Up Recommendations  Skilled nursing-short term rehab (<3 hours/day)     Assistance Recommended at Discharge Frequent or constant Supervision/Assistance  Patient can return home with the following Two people to help with walking and/or transfers;A lot of help with bathing/dressing/bathroom;Assistance with cooking/housework;Assist for transportation;Help with stairs or ramp for entrance   Equipment Recommendations  None recommended by PT    Recommendations for Other Services OT consult     Precautions / Restrictions Precautions Precautions: Fall Restrictions Weight Bearing Restrictions: Yes RLE Weight Bearing: Touchdown weight bearing     Mobility  Bed Mobility Overal bed mobility: Needs Assistance Bed Mobility: Sit to Supine     Supine to sit: Min assist, Mod assist, +2 for physical  assistance, +2 for safety/equipment Sit to supine: Mod assist, +2 for physical assistance, +2 for safety/equipment   General bed mobility comments: INcreased time with cues for sequence, use of L LE to self assist and physical assist to manage R LE, to control trunk.  Marked improvement in pt ability to scoot hips laterally using R LE    Transfers Overall transfer level: Needs assistance Equipment used: Rolling walker (2 wheels) Transfers: Sit to/from Stand, Bed to chair/wheelchair/BSC Sit to Stand: From elevated surface, +2 physical assistance, +2 safety/equipment, Mod assist Stand pivot transfers: Mod assist, +2 physical assistance, +2 safety/equipment, From elevated surface         General transfer comment: Cues for LE management and use of UEs to self assist.  Physical assist to bring wt up and fwd and to balance in standing with RW; Stand pvt recliner to bed.  Pt unable to step back and bed pulled up behind him    Ambulation/Gait         Gait velocity: decr     General Gait Details: Pt unable to WB sufficiently on UEs to step.  Pt performed stand pvt with RW and mod assist and chair pulled up behind to achieve safe landing.   Stairs             Wheelchair Mobility    Modified Rankin (Stroke Patients Only)       Balance Overall balance assessment: Needs assistance Sitting-balance support: Bilateral upper extremity supported, Feet supported Sitting balance-Leahy Scale: Poor Sitting balance - Comments: posterior lean   Standing balance support: Bilateral upper extremity supported Standing balance-Leahy Scale: Poor Standing balance comment: very unsteady with cues for UE  WB on RW                            Cognition Arousal/Alertness: Awake/alert Behavior During Therapy: WFL for tasks assessed/performed Overall Cognitive Status: Within Functional Limits for tasks assessed                                          Exercises       General Comments        Pertinent Vitals/Pain Pain Assessment Pain Assessment: 0-10 Pain Score: 6  Pain Location: R hip Pain Descriptors / Indicators: Sore, Guarding, Grimacing, Spasm Pain Intervention(s): Limited activity within patient's tolerance, Monitored during session, Premedicated before session    Home Living                          Prior Function            PT Goals (current goals can now be found in the care plan section) Acute Rehab PT Goals Patient Stated Goal: HOME PT Goal Formulation: With patient Time For Goal Achievement: 06/20/22 Potential to Achieve Goals: Fair Progress towards PT goals: Progressing toward goals    Frequency    7X/week      PT Plan Current plan remains appropriate    Co-evaluation              AM-PAC PT "6 Clicks" Mobility   Outcome Measure  Help needed turning from your back to your side while in a flat bed without using bedrails?: A Lot Help needed moving from lying on your back to sitting on the side of a flat bed without using bedrails?: A Lot Help needed moving to and from a bed to a chair (including a wheelchair)?: A Lot Help needed standing up from a chair using your arms (e.g., wheelchair or bedside chair)?: A Lot Help needed to walk in hospital room?: Total Help needed climbing 3-5 steps with a railing? : Total 6 Click Score: 10    End of Session Equipment Utilized During Treatment: Gait belt Activity Tolerance: Patient limited by fatigue Patient left: in bed;with call bell/phone within reach;with family/visitor present Nurse Communication: Mobility status PT Visit Diagnosis: Difficulty in walking, not elsewhere classified (R26.2);Pain Pain - Right/Left: Right Pain - part of body: Hip     Time: 2774-1287 PT Time Calculation (min) (ACUTE ONLY): 22 min  Charges:  $Therapeutic Activity: 8-22 mins                     Travis Wolfe PT Acute Rehabilitation Services Pager  [phone] Office [phone]    Travis Wolfe 06/10/2022, 1:47 PM

## 2022-06-10 NOTE — Progress Notes (Signed)
Physical Therapy Treatment Patient Details Name: Travis Wolfe MRN: [id number] DOB: 02-Aug-1934 Today's Date: 06/10/2022   History of Present Illness Pt is an 87 year old male s/p R THR resection with static spacer placement. PMHx: s/p IRRIGATION AND DEBRIDEMENT RIGHT HIP, EVACUATION OF PSEUDOTUMOR on 05/17/21, R THA with history of infection and I&D, PAF, falls, HLD, HTN, lumbar fusion, kyphoplasty, L RTC repair    PT Comments    Pt continues very cooperative but requiring increased time and increasing level of assist for all mobility tasks.  This am, pt assisted to EOB sitting, to standing and performed stand pvt with RW but unable to step back and recliner pulled up behind to achieve safe landing.   Recommendations for follow up therapy are one component of a multi-disciplinary discharge planning process, led by the attending physician.  Recommendations may be updated based on patient status, additional functional criteria and insurance authorization.  Follow Up Recommendations  Follow physician's recommendations for discharge plan and follow up therapies     Assistance Recommended at Discharge Frequent or constant Supervision/Assistance  Patient can return home with the following Two people to help with walking and/or transfers;A lot of help with bathing/dressing/bathroom;Assistance with cooking/housework;Assist for transportation;Help with stairs or ramp for entrance   Equipment Recommendations  None recommended by PT    Recommendations for Other Services OT consult     Precautions / Restrictions Precautions Precautions: Fall Restrictions Weight Bearing Restrictions: Yes RLE Weight Bearing: Touchdown weight bearing     Mobility  Bed Mobility Overal bed mobility: Needs Assistance Bed Mobility: Supine to Sit     Supine to sit: Min assist, Mod assist, +2 for physical assistance, +2 for safety/equipment     General bed mobility comments: INcreased time with cues for  sequence, use of L LE to self assist and physical assist to manage R LE, to control trunk and to rotate pt from EOB with bed pad    Transfers Overall transfer level: Needs assistance Equipment used: Rolling walker (2 wheels) Transfers: Sit to/from Stand, Bed to chair/wheelchair/BSC Sit to Stand: From elevated surface, +2 physical assistance, +2 safety/equipment, Mod assist Stand pivot transfers: Mod assist, +2 physical assistance, +2 safety/equipment, From elevated surface         General transfer comment: Cues for LE management and use of UEs to self assist.  Physical assist to bring wt up and fwd and to balance in standing with RW; Stand pvt bed to recliner.  Pt unable to step back to recliner and chair pulled up behind him    Ambulation/Gait               General Gait Details: Pt unable to WB sufficiently on UEs to step.  Pt performed stand pvt with RW and mod assist and chair pulled up behind to achieve safe landing.   Stairs             Wheelchair Mobility    Modified Rankin (Stroke Patients Only)       Balance Overall balance assessment: Needs assistance Sitting-balance support: Bilateral upper extremity supported, Feet supported Sitting balance-Leahy Scale: Poor Sitting balance - Comments: posterior lean   Standing balance support: Bilateral upper extremity supported Standing balance-Leahy Scale: Poor Standing balance comment: very unsteady with cues for UE WB on RW                            Cognition Arousal/Alertness: Awake/alert Behavior During Therapy: [facility]  for tasks assessed/performed Overall Cognitive Status: Within Functional Limits for tasks assessed                                          Exercises      General Comments        Pertinent Vitals/Pain Pain Assessment Pain Assessment: 0-10 Pain Score: 7  Pain Location: R hip Pain Descriptors / Indicators: Sore, Guarding, Grimacing, Spasm Pain  Intervention(s): Limited activity within patient's tolerance, Monitored during session, Premedicated before session, Ice applied (muscle relaxor requested)    Home Living                          Prior Function            PT Goals (current goals can now be found in the care plan section) Acute Rehab PT Goals Patient Stated Goal: HOME PT Goal Formulation: With patient Time For Goal Achievement: 06/20/22 Potential to Achieve Goals: Fair Progress towards PT goals: Not progressing toward goals - comment    Frequency    7X/week      PT Plan Current plan remains appropriate    Co-evaluation              AM-PAC PT "6 Clicks" Mobility   Outcome Measure  Help needed turning from your back to your side while in a flat bed without using bedrails?: A Lot Help needed moving from lying on your back to sitting on the side of a flat bed without using bedrails?: A Lot Help needed moving to and from a bed to a chair (including a wheelchair)?: A Lot Help needed standing up from a chair using your arms (e.g., wheelchair or bedside chair)?: A Lot Help needed to walk in hospital room?: Total Help needed climbing 3-5 steps with a railing? : Total 6 Click Score: 10    End of Session Equipment Utilized During Treatment: Gait belt Activity Tolerance: Patient limited by fatigue Patient left: in chair;with call bell/phone within reach;with chair alarm set;with family/visitor present Nurse Communication: Mobility status PT Visit Diagnosis: Difficulty in walking, not elsewhere classified (R26.2);Pain Pain - Right/Left: Right Pain - part of body: Hip     Time: 0981-1914 PT Time Calculation (min) (ACUTE ONLY): 24 min  Charges:  $Therapeutic Activity: 23-37 mins                     Mauro Kaufmann PT Acute Rehabilitation Services Pager [phone] Office [phone]    Phoenix Dresser 06/10/2022, 12:52 PM

## 2022-06-11 LAB — CBC
HCT: 27.1 % — ABNORMAL LOW (ref 39.0–52.0)
Hemoglobin: 8.7 g/dL — ABNORMAL LOW (ref 13.0–17.0)
MCH: 31.9 pg (ref 26.0–34.0)
MCHC: 32.1 g/dL (ref 30.0–36.0)
MCV: 99.3 fL (ref 80.0–100.0)
Platelets: 242 10*3/uL (ref 150–400)
RBC: 2.73 MIL/uL — ABNORMAL LOW (ref 4.22–5.81)
RDW: 15 % (ref 11.5–15.5)
WBC: 10.1 10*3/uL (ref 4.0–10.5)
nRBC: 0 % (ref 0.0–0.2)

## 2022-06-11 MED ORDER — MECLIZINE HCL 25 MG PO TABS: 12.5000 mg | ORAL_TABLET | Freq: Three times a day (TID) | ORAL | Status: DC | PRN

## 2022-06-11 NOTE — Care Management Important Message (Signed)
Important Message  Patient Details IM Letter given. Name: Travis Wolfe MRN: [id number] Date of Birth: 1935-03-17   Medicare Important Message Given:  Yes     Caren Macadam 06/11/2022, 2:02 PM

## 2022-06-11 NOTE — TOC Progression Note (Addendum)
Transition of Care [facility]) - Progression Note   Patient Details  Name: Travis Wolfe MRN: [id number] Date of Birth: [birth year]  [facility]) CM/SW Contact  Ewing Schlein, LCSW Phone Number: 06/11/2022, 10:16 AM  Clinical Narrative: Due to limited progress, the discharge plan is changing to SNF. Patient is agreeable to SNF and requested [facility] as first choice as his wife was a nurse at the facility for several years. CSW updated Pam with [facility] regarding the change in disposition. FL2 done; PASRR received. Initial referral faxed out. TOC awaiting bed offers.  Addendum: CSW spoke with Francisco in admissions at [facility] and the facility has made bed offer. Patient's wife reported they can pay the $47/day for a private rehab room. TOC to start ins auth when notified of expected d/c date.  Expected Discharge Plan: Home w Home Health Services Barriers to Discharge: No Barriers Identified  Expected Discharge Plan and Services In-house Referral: Clinical Social Work Post Acute Care Choice: Home Health Living arrangements for the past 2 months: Single Family Home Expected Discharge Date: 06/07/22               DME Arranged: N/A DME Agency: NA HH Arranged: RN, IV Antibiotics HH Agency: [facility], [facility] Date [facility] Agency Contacted: 06/07/22 Representative spoke with at [facility] [place] Agency: Pam  Social Determinants of Health (SDOH) Interventions SDOH Screenings   Food Insecurity: No Food Insecurity (06/06/2022)  Housing: Low Risk  (06/06/2022)  Transportation Needs: No Transportation Needs (06/06/2022)  Utilities: Not At Risk (06/06/2022)  Depression (PHQ2-9): Low Risk  (04/11/2019)  Tobacco Use: Low Risk  (06/07/2022)   Readmission Risk Interventions     No data to display

## 2022-06-11 NOTE — Progress Notes (Signed)
Patient ID: Travis Wolfe, male   DOB: 1934/10/05, 87 y.o.   MRN: [id number] Subjective: 5 Days Post-Op Procedure(s) (LRB): EXCISIONAL TOTAL HIP ARTHROPLASTY WITH ANTIBIOTIC SPACERS (Right)    Patient reports pain as moderate with activity  Objective:   VITALS:   Vitals:   06/10/22 2153 06/11/22 0543  BP: (!) 114/48 (!) 135/50  Pulse: 67 63  Resp: 18 20  Temp: 98.4 F (36.9 C) 98.1 F (36.7 C)  SpO2: 98% 99%    Neurovascular intact Incision: dressing C/D/I HV drain output decreasing  LABS Recent Labs    06/09/22 0736  HGB 8.2*  HCT 25.6*  WBC 10.1  PLT 195    No results for input(s): "NA", "K", "BUN", "CREATININE", "GLUCOSE" in the last 72 hours.  No results for input(s): "LABPT", "INR" in the last 72 hours.  AP pelvis was ordered yesterday due to pain Stable femur following ETO without evidence of fracture Antibiotic spacer ball within the joint space not in acetabulum   Assessment/Plan: 5 Days Post-Op Procedure(s) (LRB): EXCISIONAL TOTAL HIP ARTHROPLASTY WITH ANTIBIOTIC SPACERS (Right)   Up with therapy Based on challenges noted with acute PT we will begin to work on ST SNF placement with goals of eventually making it home IV antibiotics per ID Follow drain output ABLA - stable at this point

## 2022-06-11 NOTE — Plan of Care (Signed)
Problem: Activity: Goal: Ability to avoid complications of mobility impairment will improve Outcome: Progressing   Problem: Pain Management: Goal: Pain level will decrease with appropriate interventions Outcome: Progressing   Problem: Clinical Measurements: Goal: Ability to maintain clinical measurements within normal limits will improve Outcome: Progressing   Haydee Salter, RN 06/11/22 9:58 AM

## 2022-06-11 NOTE — NC FL2 (Signed)
[place] MEDICAID FL2 LEVEL OF CARE FORM     IDENTIFICATION  Patient Name: Travis Wolfe Birthdate: 1935-04-19 Sex: male Admission Date (Current Location): 06/06/2022  [facility] and [place] Number:  Producer, television/film/video and Address:  [facility],  [address] [place], [place] [address]      Provider Number: [id number]  Attending Physician Name and Address:  Durene Romans, MD  Relative Name and Phone Number:  Arsh Feutz (spouse) Ph: [phone]    Current Level of Care: Hospital Recommended Level of Care: Skilled Nursing Facility Prior Approval Number:    Date Approved/Denied:   PASRR Number: [id number]  Discharge Plan: SNF    Current Diagnoses: Patient Active Problem List   Diagnosis Date Noted   Postoperative seroma of subcutaneous tissue after non-dermatologic procedure 05/02/2022   Hematoma of hip, right, subsequent encounter 01/31/2022   Hematoma of hip, right, sequela 01/31/2022   History of revision of total replacement of right hip joint 05/17/2021   Arthrofibrosis of hip joint, right 11/26/2018   Pain    Fever 02/20/2018   Metallosis    Staphylococcus infection    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   S/P hip replacement 01/26/2018   Cellulitis of right leg 12/15/2017   PAF (paroxysmal atrial fibrillation) (HCC) 12/15/2017   Abdominal wall pain in right lower quadrant 12/02/2012    Orientation RESPIRATION BLADDER Height & Weight     Self, Time, Situation, Place  Normal Continent Weight: 150 lb (68 kg) Height:  5\' 8"  (172.7 cm)  BEHAVIORAL SYMPTOMS/MOOD NEUROLOGICAL BOWEL NUTRITION STATUS   (N/A)  (N/A) Continent Diet (Regular diet)  AMBULATORY STATUS COMMUNICATION OF NEEDS Skin   Extensive Assist Verbally Surgical wounds, Skin abrasions (Abrasions: bilateral arms, legs, face)                       Personal Care Assistance Level of Assistance  Bathing, Feeding, Dressing Bathing Assistance: Maximum  assistance Feeding assistance: Independent Dressing Assistance: Maximum assistance     Functional Limitations Info  Sight, Hearing, Speech Sight Info: Adequate Hearing Info: Adequate Speech Info: Adequate    SPECIAL CARE FACTORS FREQUENCY  PT (By licensed PT), OT (By licensed OT)     PT Frequency: 5x's/week OT Frequency: 5x's/week            Contractures Contractures Info: Not present    Additional Factors Info  Code Status, Allergies Code Status Info: Full Allergies Info: Tape, Other, Doxycycline, Horse Epithelium           Current Medications (06/11/2022):  This is the current hospital active medication list Current Facility-Administered Medications  Medication Dose Route Frequency Provider Last Rate Last Admin   0.9 %  sodium chloride infusion   Intravenous PRN 06/13/2022, MD 10 mL/hr at 06/09/22 1525 Infusion Verify at 06/09/22 1525   acetaminophen (TYLENOL) tablet 325-650 mg  325-650 mg Oral Q6H PRN 06/11/22, PA-C   650 mg at 06/09/22 2211   apixaban (ELIQUIS) tablet 2.5 mg  2.5 mg Oral BID 2212, PA-C   2.5 mg at 06/10/22 2104   bisacodyl (DULCOLAX) suppository 10 mg  10 mg Rectal Daily PRN 2105, PA-C       cefTRIAXone (ROCEPHIN) 2 g in sodium chloride 0.9 % 100 mL IVPB  2 g Intravenous Daily Cassandria Anger, MD 200 mL/hr at 06/10/22 1012 2 g at 06/10/22 1012   Chlorhexidine Gluconate Cloth 2 % PADS 6 each  6 each Topical Q0600 Durene Romans, MD   6 each at 06/11/22 [phone]   DAPTOmycin (CUBICIN) 500 mg in sodium chloride 0.9 % IVPB  500 mg Intravenous Q24H Danelle Earthly, MD 120 mL/hr at 06/10/22 1046 500 mg at 06/10/22 1046   diphenhydrAMINE (BENADRYL) 12.5 MG/5ML elixir 12.5-25 mg  12.5-25 mg Oral Q4H PRN Cassandria Anger, PA-C       docusate sodium (COLACE) capsule 100 mg  100 mg Oral BID Cassandria Anger, PA-C   100 mg at 06/10/22 1008   ferrous sulfate tablet 325 mg  325 mg Oral BID WC Durene Romans, MD   325 mg at 06/11/22  7048   furosemide (LASIX) tablet 20 mg  20 mg Oral Daily PRN Cassandria Anger, PA-C       HYDROcodone-acetaminophen (NORCO) 7.5-325 MG per tablet 1-2 tablet  1-2 tablet Oral Q4H PRN Cassandria Anger, PA-C   2 tablet at 06/11/22 8891   HYDROcodone-acetaminophen (NORCO/VICODIN) 5-325 MG per tablet 1-2 tablet  1-2 tablet Oral Q4H PRN Cassandria Anger, PA-C       menthol-cetylpyridinium (CEPACOL) lozenge 3 mg  1 lozenge Oral PRN Cassandria Anger, PA-C       Or   phenol (CHLORASEPTIC) mouth spray 1 spray  1 spray Mouth/Throat PRN Cassandria Anger, PA-C       methocarbamol (ROBAXIN) tablet 500 mg  500 mg Oral Q6H PRN Rosalene Billings R, PA-C   500 mg at 06/11/22 0129   Or   methocarbamol (ROBAXIN) 500 mg in dextrose 5 % 50 mL IVPB  500 mg Intravenous Q6H PRN Cassandria Anger, PA-C   Stopped at 06/06/22 1530   metoCLOPramide (REGLAN) tablet 5-10 mg  5-10 mg Oral Q8H PRN Cassandria Anger, PA-C       Or   metoCLOPramide (REGLAN) injection 5-10 mg  5-10 mg Intravenous Q8H PRN Cassandria Anger, PA-C       morphine (PF) 2 MG/ML injection 0.5-1 mg  0.5-1 mg Intravenous Q2H PRN Rosalene Billings R, PA-C   1 mg at 06/10/22 1019   ondansetron (ZOFRAN) tablet 4 mg  4 mg Oral Q6H PRN Cassandria Anger, PA-C       Or   ondansetron [facility]) injection 4 mg  4 mg Intravenous Q6H PRN Cassandria Anger, PA-C   4 mg at 06/10/22 1151   Oral care mouth rinse  15 mL Mouth Rinse PRN Durene Romans, MD       polyethylene glycol (MIRALAX / GLYCOLAX) packet 17 g  17 g Oral BID Cassandria Anger, PA-C   17 g at 06/09/22 1016   potassium chloride (KLOR-CON) CR tablet 20 mEq  20 mEq Oral Daily PRN Cassandria Anger, PA-C       ranolazine (RANEXA) 12 hr tablet 500 mg  500 mg Oral BID Cassandria Anger, PA-C   500 mg at 06/10/22 2104   sodium chloride flush (NS) 0.9 % injection 10-40 mL  10-40 mL Intracatheter Q12H Durene Romans, MD   10 mL at 06/10/22 1008   sodium chloride flush (NS) 0.9 % injection 10-40 mL  10-40 mL  Intracatheter PRN Durene Romans, MD       zolpidem Remus Loffler) tablet 5 mg  5 mg Oral QHS PRN Cassandria Anger, PA-C   5 mg at 06/09/22 2109     Discharge Medications: Please see discharge summary for a list of discharge medications.  Relevant Imaging Results:  Relevant Lab Results:   Additional  Information SSN: [id number]  Ewing Schlein, LCSW

## 2022-06-11 NOTE — Plan of Care (Signed)
  Problem: Pain Management: Goal: Pain level will decrease with appropriate interventions Outcome: Progressing   
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
## 2022-06-12 LAB — CREATININE, SERUM
Creatinine, Ser: 0.95 mg/dL (ref 0.61–1.24)
GFR, Estimated: >60 mL/min (ref >60)

## 2022-06-12 MED ORDER — OXYCODONE HCL 5 MG PO TABS: 5.0000 mg | ORAL_TABLET | ORAL | 0 refills | Status: DC | PRN

## 2022-06-12 MED ORDER — OXYCODONE HCL 5 MG PO TABS
5.0000 mg | ORAL_TABLET | ORAL | Status: DC | PRN
  Administered 2022-06-12 (×2): 10 mg via ORAL
  Filled 2022-06-12 (×2): qty 2

## 2022-06-12 MED ORDER — ACETAMINOPHEN 500 MG PO TABS
1000.0000 mg | ORAL_TABLET | Freq: Four times a day (QID) | ORAL | Status: DC
  Administered 2022-06-12: 1000 mg via ORAL
  Filled 2022-06-12: qty 2

## 2022-06-12 MED ORDER — METHOCARBAMOL 500 MG PO TABS: 500.0000 mg | ORAL_TABLET | Freq: Four times a day (QID) | ORAL | 1 refills | Status: AC | PRN

## 2022-06-12 MED ORDER — HEPARIN SOD (PORK) LOCK FLUSH 100 UNIT/ML IV SOLN
250.0000 [IU] | INTRAVENOUS | Status: AC | PRN
  Administered 2022-06-12: 250 [IU]
  Filled 2022-06-12: qty 2.5

## 2022-06-12 MED ORDER — MECLIZINE HCL 12.5 MG PO TABS: 12.5000 mg | ORAL_TABLET | Freq: Two times a day (BID) | ORAL | 0 refills | Status: DC | PRN

## 2022-06-12 MED ORDER — ACETAMINOPHEN 500 MG PO TABS: 1000.0000 mg | ORAL_TABLET | Freq: Four times a day (QID) | ORAL | 0 refills | Status: DC

## 2022-06-12 MED ORDER — OXYCODONE HCL 5 MG PO TABS: 10.0000 mg | ORAL_TABLET | ORAL | Status: DC | PRN

## 2022-06-12 NOTE — Plan of Care (Signed)
  Problem: Activity: Goal: Ability to avoid complications of mobility impairment will improve Outcome: Adequate for Discharge   Problem: Pain Management: Goal: Pain level will decrease with appropriate interventions Outcome: Adequate for Discharge   Problem: Clinical Measurements: Goal: Ability to maintain clinical measurements within normal limits will improve Outcome: Adequate for Discharge Goal: Will remain free from infection Outcome: Adequate for Discharge   Problem: Activity: Goal: Risk for activity intolerance will decrease Outcome: Adequate for Discharge   Problem: Elimination: Goal: Will not experience complications related to bowel motility Outcome: Adequate for Discharge   Problem: Pain Managment: Goal: General experience of comfort will improve Outcome: Adequate for Discharge   Problem: Safety: Goal: Ability to remain free from injury will improve Outcome: Adequate for Discharge

## 2022-06-12 NOTE — Progress Notes (Signed)
   Subjective: 6 Days Post-Op Procedure(s) (LRB): EXCISIONAL TOTAL HIP ARTHROPLASTY WITH ANTIBIOTIC SPACERS (Right) Patient reports pain as moderate.   Patient seen in rounds with Dr. Charlann Boxer. Patient is resting in bed on exam this morning. No acute events overnight. Voiding without difficulty. Social work arranging SNF placement. Did not get up with PT yesterday, but notes significant pain with attempts at ambulation or rolling in bed.  We will continue therapy today.   Objective: Vital signs in last 24 hours: Temp:  [97.4 F (36.3 C)-98.8 F (37.1 C)] 98.1 F (36.7 C) (12/27 0437) Pulse Rate:  [61-65] 62 (12/27 0437) Resp:  [14-16] 15 (12/27 0437) BP: (141-150)/(55-66) 150/66 (12/27 0437) SpO2:  [96 %-99 %] 98 % (12/27 0437)  Intake/Output from previous day:  Intake/Output Summary (Last 24 hours) at 06/12/2022 0805 Last data filed at 06/12/2022 0600 Gross per 24 hour  Intake 351.95 ml  Output 365 ml  Net -13.05 ml     Intake/Output this shift: No intake/output data recorded.  Labs: Recent Labs    06/11/22 1530  HGB 8.7*   Recent Labs    06/11/22 1530  WBC 10.1  RBC 2.73*  HCT 27.1*  PLT 242   Recent Labs    06/12/22 0437  CREATININE 0.95   No results for input(s): "LABPT", "INR" in the last 72 hours.  Exam: General - Patient is Alert and Oriented Extremity - Neurologically intact Sensation intact distally Intact pulses distally Dorsiflexion/Plantar flexion intact Dressing - dressing C/D/I Hemovac drain in place Motor Function - intact, moving foot and toes well on exam.   Past Medical History:  Diagnosis Date   A-fib (HCC)    REPORTS WAS DUE TO DEHYDRATION 3 YEARS AGO BUT WAS GIVEN FLUIDS IN ED AND MEDS TO SLOW HR AND DROVE HIMSELF HOME  ; HAD ABLATION X2 WITH CARDIO DR. AKBARY AS PRECAUTION  ; has been on eliquis since but reports he has not taken it for 5 weeks b/c he was told to hold it for the multiple irrigations and debridements for the hip  infection. reports his cardiologist is aware he has been holding it;    Arthritis    Atherosclerosis of both carotid arteries    BPH (benign prostatic hyperplasia)    Dysrhythmia    a-fib   Fall    fell on friday 01-16-18;   lost balAnce whIile tryng to get his sock on ;sustained skin tear to left wrist ; no drainage  , scabbed over , and has been covering with bandage    Heart murmur    SINCE HIS 20s   HLD (hyperlipidemia)    Hypertension    Non-rheumatic mitral regurgitation     Assessment/Plan: 6 Days Post-Op Procedure(s) (LRB): EXCISIONAL TOTAL HIP ARTHROPLASTY WITH ANTIBIOTIC SPACERS (Right) Principal Problem:   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   Weight as of this encounter: 68 kg. Advance diet Up with therapy D/C IV fluids  DVT Prophylaxis -  Eliquis TDWB RLE  Hgb stable at 8.7 this AM.  Plan for d/c to SNF when bed available. Rx printed & signed in chart. Hemovac drain to be left in place.   Follow up in the office in 2 weeks.  , PA-C Orthopedic Surgery [phone] 06/12/2022, 8:05 AM

## 2022-06-12 NOTE — TOC Transition Note (Signed)
Transition of Care [facility]) - CM/SW Discharge Note  Patient Details  Name: Travis Wolfe MRN: [id number] Date of Birth: April 28, 1935  [facility] CM/SW Contact:  Ewing Schlein, LCSW Phone Number: 06/12/2022, 11:27 AM  Clinical Narrative: CSW completed insurance authorization for SNF in [facility] portal, which was approved. Plan auth ID # is: [id number]. Reference # is: [id number]. Patient has been approved for 06/12/2022-06/14/2022. CSW confirmed bed with Whitney in admissions at [facility]. Patient will go to room 107 and the number for report is [phone]. Discharge summary, discharge orders, and SNF transfer report faxed to facility in hub.  Medical necessity form done; PTAR scheduled. Discharge packet completed. Patient and wife updated regarding insurance approval and transportation being scheduled. RN updated. TOC signing off.  Final next level of care: Skilled Nursing Facility Barriers to Discharge: Barriers Resolved  Patient Goals and CMS Choice CMS Medicare.gov Compare Post Acute Care list provided to:: Patient Choice offered to / list presented to : Patient  Discharge Placement PASRR number recieved: 06/11/22 PASRR number recieved: 06/11/22       Patient chooses bed at: [facility] Patient to be transferred to facility by: PTAR Name of family member notified: Travis Wolfe (wife) Patient and family notified of of transfer: 06/12/22  Discharge Plan and Services Additional resources added to the After Visit Summary for: N/A In-house Referral: Clinical Social Work Post Acute Care Choice: Skilled Nursing Facility          DME Arranged: N/A DME Agency: NA HH Arranged: RN, IV Antibiotics HH Agency: [facility], [facility] Date [facility] Agency Contacted: 06/07/22 Representative spoke with at [facility] Agency: Pam  Social Determinants of Health (SDOH) Interventions SDOH Screenings   Food Insecurity: No Food Insecurity (06/06/2022)  Housing: Low  Risk  (06/06/2022)  Transportation Needs: No Transportation Needs (06/06/2022)  Utilities: Not At Risk (06/06/2022)  Depression (PHQ2-9): Low Risk  (04/11/2019)  Tobacco Use: Low Risk  (06/07/2022)   Readmission Risk Interventions     No data to display

## 2022-06-12 NOTE — Progress Notes (Signed)
Physical Therapy Treatment Patient Details Name: Travis Wolfe MRN: [id number] DOB: [birth year] Today's Date: 06/12/2022   History of Present Illness Pt is an 87 year old male s/p R THR resection with static spacer placement. PMHx: s/p IRRIGATION AND DEBRIDEMENT RIGHT HIP, EVACUATION OF PSEUDOTUMOR on 05/17/21, R THA with history of infection and I&D, PAF, falls, HLD, HTN, lumbar fusion, kyphoplasty, L RTC repair    PT Comments    POD # 6  General Comments: AxO x 3 Hx multiple R hip surgeries.  Retired Comptroller Pt progressing slowly.  Assisted OOB to attempt gait was difficult.  General bed mobility comments: required increased assist as well as increased time.  Attempted to Educate Pt on using strap to self assist LE but was limited by pain/effort despite having had pain meds prior.  General transfer comment: pt required + 2 side by side asisst to rise from elevated bed.  Great difficulty "bending at the waist" Hx back surgery.  General Gait Details: Used B plaftorm EVA walker this session to attempt to advance gait however pt had difficulty advancing R LE and maintaining TTWB.  Recliner pulled up to pt from behind after 2 feet/ Pt will need ST Rehab at SNF to address mobility and functional decline prior to safely returning home.   Recommendations for follow up therapy are one component of a multi-disciplinary discharge planning process, led by the attending physician.  Recommendations may be updated based on patient status, additional functional criteria and insurance authorization.  Follow Up Recommendations  Skilled nursing-short term rehab (<3 hours/day) Can patient physically be transported by private vehicle: No   Assistance Recommended at Discharge Frequent or constant Supervision/Assistance  Patient can return home with the following Two people to help with walking and/or transfers;A lot of help with bathing/dressing/bathroom;Assistance with cooking/housework;Assist for  transportation;Help with stairs or ramp for entrance   Equipment Recommendations  None recommended by PT    Recommendations for Other Services       Precautions / Restrictions Precautions Precautions: Fall Precaution Comments: HemaVac, Hx Back surgery "multiple rods" (bending at waist is limited) Restrictions Weight Bearing Restrictions: Yes RLE Weight Bearing: Touchdown weight bearing     Mobility  Bed Mobility Overal bed mobility: Needs Assistance Bed Mobility: Supine to Sit     Supine to sit: Max assist, Total assist     General bed mobility comments: required increased assist as well as increased time.  Attempted to Educate Pt on using strap to self assist LE but was limited by pain/effort despite having had pain meds prior.    Transfers Overall transfer level: Needs assistance Equipment used: None Transfers: Sit to/from Stand Sit to Stand: From elevated surface, +2 physical assistance, +2 safety/equipment, Mod assist Stand pivot transfers: Max assist, +2 physical assistance, +2 safety/equipment, From elevated surface         General transfer comment: pt required + 2 side by side asisst to rise from elevated bed.  Great difficulty "bending at the waist" Hx back surgery.    Ambulation/Gait Ambulation/Gait assistance: Max assist, +2 physical assistance, +2 safety/equipment Gait Distance (Feet): 2 Feet Assistive device: Fara Boros Gait Pattern/deviations: Step-to pattern, Decreased step length - right, Decreased step length - left, Shuffle, Trunk flexed Gait velocity: decreased     General Gait Details: Used B plaftorm EVA walker this session to attempt to advance gait however pt had difficulty advancing R LE and maintaining TTWB.  Recliner pulled up to pt from behind after 2 feet/  Stairs             Wheelchair Mobility    Modified Rankin (Stroke Patients Only)       Balance                                             Cognition Arousal/Alertness: Awake/alert Behavior During Therapy: WFL for tasks assessed/performed Overall Cognitive Status: Within Functional Limits for tasks assessed                                 General Comments: AxO x 3 Hx multiple R hip surgeries.  Retired Research scientist (medical) Comments        Pertinent Vitals/Pain Pain Assessment Pain Assessment: 0-10 Pain Score: 7  Pain Location: R hip Pain Descriptors / Indicators: Sore, Guarding, Grimacing, Spasm, Operative site guarding Pain Intervention(s): Monitored during session, Premedicated before session, Repositioned, Ice applied    Home Living                          Prior Function            PT Goals (current goals can now be found in the care plan section) Progress towards PT goals: Progressing toward goals    Frequency    7X/week      PT Plan Discharge plan needs to be updated    Co-evaluation              AM-PAC PT "6 Clicks" Mobility   Outcome Measure  Help needed turning from your back to your side while in a flat bed without using bedrails?: A Lot Help needed moving from lying on your back to sitting on the side of a flat bed without using bedrails?: A Lot Help needed moving to and from a bed to a chair (including a wheelchair)?: A Lot Help needed standing up from a chair using your arms (e.g., wheelchair or bedside chair)?: Total Help needed to walk in hospital room?: Total Help needed climbing 3-5 steps with a railing? : Total 6 Click Score: 9    End of Session Equipment Utilized During Treatment: Gait belt Activity Tolerance: Patient limited by fatigue;Patient limited by pain Patient left: in chair;with call bell/phone within reach;with chair alarm set;with family/visitor present Nurse Communication: Mobility status PT Visit Diagnosis: Difficulty in walking, not elsewhere classified (R26.2);Pain Pain - Right/Left: Right Pain -  part of body: Hip     Time: 2671-2458 PT Time Calculation (min) (ACUTE ONLY): 28 min  Charges:  $Therapeutic Exercise: 8-22 mins $Therapeutic Activity: 8-22 mins                    Felecia Shelling  PTA Acute  Rehabilitation Services Office M-F          [phone] Weekend pager [phone]

## 2022-06-12 NOTE — Discharge Summary (Signed)
Physician Discharge Summary   Patient ID: Travis Wolfe MRN: [id number] DOB/AGE: 20-Oct-1934 87 y.o.  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   Admission Diagnoses:  Past Medical History:  Diagnosis Date   A-fib ([place])    REPORTS WAS DUE TO DEHYDRATION 3 YEARS AGO BUT WAS GIVEN FLUIDS IN ED AND MEDS TO SLOW HR AND DROVE HIMSELF HOME  ; HAD ABLATION X2 WITH CARDIO DR. AKBARY AS PRECAUTION  ; has been on eliquis since but reports he has not taken it for 5 weeks b/c he was told to hold it for the multiple irrigations and debridements for the hip infection. 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   Weight as of this encounter: 68 kg.  Procedure:  Procedure(s) (LRB): EXCISIONAL TOTAL HIP ARTHROPLASTY WITH ANTIBIOTIC SPACERS (Right)   Consults: ID  HPI: The patient is a pleasant 87 year old male who had a history of primary index total hip arthroplasty performed approximately 2003.  This was apparently complicated by metalosis and was subsequently revised.  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  He has had multiple  surgical procedures including recently the development of recurrent postoperative seromas.  Most recently an attempt to  try to decompress this area and prevent further operations we had performed an I and D with placement of a wound VAC to try to decompress the space to allow for healing.  However, this proved to be unsuccessful with persistent seroma formation.  We have  had lengthy discussions regarding long-term management and the potential need to resect his hip.  At this point, I felt that it was in his best interest from a hip standpoint, but also from a generalized health to resect his hip.  Whether or not we  planned to perform a reimplantation now will be dependent on how he heals and the response to the overall postoperative infection and/or seroma.  Consent was obtained for management of the above.  Laboratory Data: Admission on 06/06/2022  Component Date Value Ref Range Status   ABO/RH(D) 06/06/2022 A POS   Final   Unit Number 06/06/2022 [id number]   Final   Blood Component Type 06/06/2022 FP24, THAWED   Final   Unit division 06/06/2022 00   Final   Status of Unit 06/06/2022 OUTDATED/DESTROYED   Final   Transfusion Status 06/06/2022    Final                   Value:OK TO TRANSFUSE Performed at [facility]  [place] [address] [place], [place] [address]    Unit Number 06/06/2022 [id number]   Final   Blood Component Type 06/06/2022 FP24, THAWED   Final   Unit division 06/06/2022 00   Final   Status of Unit 06/06/2022 OUTDATED/DESTROYED   Final   Transfusion Status 06/06/2022 OK TO TRANSFUSE   Final   pH, Ven 06/06/2022 7.363  7.25 - 7.43 Final   pCO2, Ven 06/06/2022 39.9 (L)  44 - 60 mmHg Final   pO2, Ven 06/06/2022 63 (H)  32 - 45 mmHg Final   Bicarbonate 06/06/2022 22.7  20.0 - 28.0 mmol/L Final   TCO2 06/06/2022 24  22 - 32 mmol/L Final   O2 Saturation 06/06/2022 91  % Final   Acid-base deficit 06/06/2022 3.0 (H)  0.0 - 2.0 mmol/L Final   Sodium  06/06/2022 140  135 - 145 mmol/L Final   Potassium 06/06/2022 4.5  3.5 - 5.1 mmol/L Final   Calcium, Ion 06/06/2022 1.18  1.15 - 1.40 mmol/L Final   HCT 06/06/2022 25.0 (L)  39.0 - 52.0 % Final   Hemoglobin 06/06/2022 8.5 (L)  13.0 - 17.0 g/dL Final   Sample type 06/06/2022 VENOUS   Final   Order Confirmation 06/06/2022    Final                   Value:ORDER PROCESSED BY BLOOD BANK Performed at [facility], [place] [address], [place], [place] [address]    Blood Product Unit Number 06/06/2022 [id number]   Final   PRODUCT CODE 06/06/2022 T0160F09   Final   Unit Type and Rh 06/06/2022 6200   Final   Blood Product Expiration Date 06/06/2022 [id number]   Final   Blood Product Unit Number 06/06/2022 [id number]   Final   PRODUCT CODE 06/06/2022 E2737V00   Final   Unit Type and Rh 06/06/2022 6200   Final   Blood Product Expiration Date 06/06/2022 [id number]   Final   WBC 06/07/2022 13.4 (H)  4.0 - 10.5 K/uL Final   RBC 06/07/2022 2.61 (L)  4.22 - 5.81 MIL/uL Final   Hemoglobin 06/07/2022 8.3 (L)  13.0 - 17.0 g/dL Final   HCT 06/07/2022 24.8 (L)  39.0 - 52.0 % Final   MCV 06/07/2022 95.0  80.0 - 100.0 fL Final   MCH 06/07/2022 31.8  26.0 - 34.0 pg Final   MCHC 06/07/2022 33.5  30.0 - 36.0 g/dL Final   RDW 06/07/2022 15.1  11.5 - 15.5 % Final   Platelets 06/07/2022 166  150 - 400 K/uL Final   nRBC 06/07/2022 0.0  0.0 - 0.2 % Final   Performed at [facility], [place] [address], [place], [place] [address]   Sodium 06/07/2022 138  135 - 145 mmol/L Final   Potassium 06/07/2022 4.9  3.5 - 5.1 mmol/L Final   Chloride 06/07/2022 109  98 - 111 mmol/L Final   CO2 06/07/2022 23  22 - 32 mmol/L Final   Glucose, Bld 06/07/2022 129 (H)  70 - 99 mg/dL Final   Glucose reference range applies only to samples taken after fasting for at least 8 hours.   BUN 06/07/2022 21  8 - 23 mg/dL Final   Creatinine, Ser 06/07/2022 1.18  0.61 - 1.24 mg/dL Final   Calcium  06/07/2022 7.5 (L)  8.9 - 10.3 mg/dL Final   GFR, Estimated 06/07/2022 >60  >60 mL/min Final   Comment: (NOTE) Calculated using the CKD-EPI Creatinine Equation (2021)  Anion gap 06/07/2022 6  5 - 15 Final   Performed at [facility], [place] [address], [place], [place] [address]   Total CK 06/07/2022 199  49 - 397 U/L Final   Performed at [facility], [place] [address] [place], [place] [address]   WBC 06/08/2022 15.3 (H)  4.0 - 10.5 K/uL Final   RBC 06/08/2022 2.45 (L)  4.22 - 5.81 MIL/uL Final   Hemoglobin 06/08/2022 7.8 (L)  13.0 - 17.0 g/dL Final   HCT 06/08/2022 23.6 (L)  39.0 - 52.0 % Final   MCV 06/08/2022 96.3  80.0 - 100.0 fL Final   MCH 06/08/2022 31.8  26.0 - 34.0 pg Final   MCHC 06/08/2022 33.1  30.0 - 36.0 g/dL Final   RDW 06/08/2022 14.7  11.5 - 15.5 % Final   Platelets 06/08/2022 178  150 - 400 K/uL Final   nRBC 06/08/2022 0.0  0.0 - 0.2 % Final   Performed at [facility], [place] [address], [place], [place] [address]   Creatinine, Ser 06/08/2022 1.27 (H)  0.61 - 1.24 mg/dL Final   GFR, Estimated 06/08/2022 55 (L)  >60 mL/min Final   Comment: (NOTE) Calculated using the CKD-EPI Creatinine Equation (2021) Performed at [facility], [place] [address] [place], [place] [address]    WBC 06/09/2022 10.1  4.0 - 10.5 K/uL Final   RBC 06/09/2022 2.57 (L)  4.22 - 5.81 MIL/uL Final   Hemoglobin 06/09/2022 8.2 (L)  13.0 - 17.0 g/dL Final   HCT 06/09/2022 25.6 (L)  39.0 - 52.0 % Final   MCV 06/09/2022 99.6  80.0 - 100.0 fL Final   MCH 06/09/2022 31.9  26.0 - 34.0 pg Final   MCHC 06/09/2022 32.0  30.0 - 36.0 g/dL Final   RDW 06/09/2022 14.7  11.5 - 15.5 % Final   Platelets 06/09/2022 195  150 - 400 K/uL Final   nRBC 06/09/2022 0.0  0.0 - 0.2 % Final   Performed at [facility], [place] [address], [place], [place] [address]   WBC 06/11/2022 10.1  4.0 - 10.5 K/uL Final   RBC 06/11/2022 2.73 (L)   4.22 - 5.81 MIL/uL Final   Hemoglobin 06/11/2022 8.7 (L)  13.0 - 17.0 g/dL Final   HCT 06/11/2022 27.1 (L)  39.0 - 52.0 % Final   MCV 06/11/2022 99.3  80.0 - 100.0 fL Final   MCH 06/11/2022 31.9  26.0 - 34.0 pg Final   MCHC 06/11/2022 32.1  30.0 - 36.0 g/dL Final   RDW 06/11/2022 15.0  11.5 - 15.5 % Final   Platelets 06/11/2022 242  150 - 400 K/uL Final   nRBC 06/11/2022 0.0  0.0 - 0.2 % Final   Performed at [facility], [place] [address], [place], [place] [address]   Creatinine, Ser 06/12/2022 0.95  0.61 - 1.24 mg/dL Final   GFR, Estimated 06/12/2022 >60  >60 mL/min Final   Comment: (NOTE) Calculated using the CKD-EPI Creatinine Equation (2021) Performed at [facility], [place] [address], [place], [place] [address]   Hospital Outpatient Visit on 06/03/2022  Component Date Value Ref Range Status   MRSA, PCR 06/03/2022 NEGATIVE  NEGATIVE Final   Staphylococcus aureus 06/03/2022 NEGATIVE  NEGATIVE Final   Comment: (NOTE) The Xpert SA Assay (FDA approved for NASAL specimens in patients 10 years of age and older), is one component of a comprehensive surveillance program. It is not intended to diagnose infection  nor to guide or monitor treatment. Performed at [facility], [place] [address] [place], [place] [address]    Sodium 06/03/2022 137  135 - 145 mmol/L Final   Potassium 06/03/2022 4.6  3.5 - 5.1 mmol/L Final   Chloride 06/03/2022 106  98 - 111 mmol/L Final   CO2 06/03/2022 25  22 - 32 mmol/L Final   Glucose, Bld 06/03/2022 101 (H)  70 - 99 mg/dL Final   Glucose reference range applies only to samples taken after fasting for at least 8 hours.   BUN 06/03/2022 16  8 - 23 mg/dL Final   Creatinine, Ser 06/03/2022 1.02  0.61 - 1.24 mg/dL Final   Calcium 06/03/2022 8.6 (L)  8.9 - 10.3 mg/dL Final   GFR, Estimated 06/03/2022 >60  >60 mL/min Final   Comment: (NOTE) Calculated using the CKD-EPI Creatinine Equation (2021)    Anion  gap 06/03/2022 6  5 - 15 Final   Performed at [facility], [place] [address] [place], [place] [address]   WBC 06/03/2022 10.5  4.0 - 10.5 K/uL Final   RBC 06/03/2022 3.84 (L)  4.22 - 5.81 MIL/uL Final   Hemoglobin 06/03/2022 12.3 (L)  13.0 - 17.0 g/dL Final   HCT 06/03/2022 38.4 (L)  39.0 - 52.0 % Final   MCV 06/03/2022 100.0  80.0 - 100.0 fL Final   MCH 06/03/2022 32.0  26.0 - 34.0 pg Final   MCHC 06/03/2022 32.0  30.0 - 36.0 g/dL Final   RDW 06/03/2022 13.9  11.5 - 15.5 % Final   Platelets 06/03/2022 288  150 - 400 K/uL Final   nRBC 06/03/2022 0.0  0.0 - 0.2 % Final   Performed at [facility], [place] [address] [place], [place] [address]   ABO/RH(D) 06/03/2022 A POS   Final   Antibody Screen 06/03/2022 NEG   Final   Sample Expiration 06/03/2022 06/09/2022,2359   Final   Extend sample reason 06/03/2022 NO TRANSFUSIONS OR PREGNANCY IN THE PAST 3 MONTHS   Final   Unit Number 06/03/2022 [id number]   Final   Blood Component Type 06/03/2022 RED CELLS,LR   Final   Unit division 06/03/2022 00   Final   Status of Unit 06/03/2022 ISSUED,FINAL   Final   Transfusion Status 06/03/2022 OK TO TRANSFUSE   Final   Crossmatch Result 06/03/2022 Compatible   Final   Unit Number 06/03/2022 [id number]   Final   Blood Component Type 06/03/2022 RED CELLS,LR   Final   Unit division 06/03/2022 00   Final   Status of Unit 06/03/2022 ISSUED,FINAL   Final   Transfusion Status 06/03/2022 OK TO TRANSFUSE   Final   Crossmatch Result 06/03/2022    Final                   Value:Compatible Performed at [facility], [place] [address], [place], [place] [address]    ISSUE DATE / TIME 06/03/2022 [id number]   Final   Blood Product Unit Number 06/03/2022 [id number]   Final   PRODUCT CODE 06/03/2022 [id number]   Final   Unit Type and Rh 06/03/2022 6200   Final   Blood Product Expiration Date 06/03/2022 [id number]   Final   ISSUE DATE / TIME 06/03/2022  [id number]   Final   Blood Product Unit Number 06/03/2022 [id number]   Final   PRODUCT CODE 06/03/2022 [id number]   Final   Unit Type and Rh 06/03/2022 6200   Final  Blood Product Expiration Date 06/03/2022 [id number]   Final  Admission on 05/02/2022, Discharged on 05/03/2022  Component Date Value Ref Range Status   WBC 05/03/2022 8.5  4.0 - 10.5 K/uL Final   RBC 05/03/2022 3.61 (L)  4.22 - 5.81 MIL/uL Final   Hemoglobin 05/03/2022 11.9 (L)  13.0 - 17.0 g/dL Final   HCT 05/03/2022 35.8 (L)  39.0 - 52.0 % Final   MCV 05/03/2022 99.2  80.0 - 100.0 fL Final   MCH 05/03/2022 33.0  26.0 - 34.0 pg Final   MCHC 05/03/2022 33.2  30.0 - 36.0 g/dL Final   RDW 05/03/2022 14.1  11.5 - 15.5 % Final   Platelets 05/03/2022 111 (L)  150 - 400 K/uL Final   nRBC 05/03/2022 0.0  0.0 - 0.2 % Final   Performed at [facility], [place] [address], [place], [place] [address]   Sodium 05/03/2022 137  135 - 145 mmol/L Final   Potassium 05/03/2022 4.4  3.5 - 5.1 mmol/L Final   Chloride 05/03/2022 110  98 - 111 mmol/L Final   CO2 05/03/2022 23  22 - 32 mmol/L Final   Glucose, Bld 05/03/2022 157 (H)  70 - 99 mg/dL Final   Glucose reference range applies only to samples taken after fasting for at least 8 hours.   BUN 05/03/2022 19  8 - 23 mg/dL Final   Creatinine, Ser 05/03/2022 1.03  0.61 - 1.24 mg/dL Final   Calcium 05/03/2022 8.1 (L)  8.9 - 10.3 mg/dL Final   GFR, Estimated 05/03/2022 >60  >60 mL/min Final   Comment: (NOTE) Calculated using the CKD-EPI Creatinine Equation (2021)    Anion gap 05/03/2022 4 (L)  5 - 15 Final   Performed at [facility], [place] [address] [place], [place] [address]  Hospital Outpatient Visit on 04/29/2022  Component Date Value Ref Range Status   ABO/RH(D) 04/29/2022 A POS   Final   Antibody Screen 04/29/2022 NEG   Final   Sample Expiration 04/29/2022 05/05/2022,2359   Final   Extend sample reason 04/29/2022    Final                    Value:NO TRANSFUSIONS OR PREGNANCY IN THE PAST 3 MONTHS Performed at [facility], [place] [address], [place],  [address]      X-Rays:DG Pelvis Portable  Result Date: 06/09/2022 CLINICAL DATA:  Severe right hip pain. Postop day 3 from excisional total hip arthroplasty with antibiotic spacer placement. EXAM: PORTABLE PELVIS 1-2 VIEWS COMPARISON:  X-ray 06/06/2022 FINDINGS: Bones are diffusely demineralized. Antibiotic spacer material identified in the region of the right hip, stable. Cerclage wires noted along the proximal femur with underlying osteotomy defect again noted. Surgical drain overlies the hip. There is edema in the soft tissues of the right hip region. The antibiotic cement and proximal femur is positioned more cranially on the current study than previously although this may be positional. IMPRESSION: No substantial interval change. Status post retrieval of total hip arthroplasty with antibiotic spacer placement. Proximal femur and the antibiotic spacer project more cranially on the current study than on the exam from 3 days ago although this may be positional. Electronically Signed   By: Misty Stanley M.D.   On: 06/09/2022 17:50   DG Pelvis Portable  Result Date: 06/06/2022 CLINICAL DATA:  Status post total hip arthroplasty.  Imaged in PACU. EXAM: PORTABLE PELVIS 1-2 VIEWS COMPARISON:  Frontal view of the bilateral hips 11/26/2018,  FINDINGS: Interval removal of prior total right hip arthroplasty hardware. There are 4 cerclage wires now overlying the proximal right femoral diaphysis. There is antibiotic cement in the region of the right femoroacetabular joint imaging acetabulum and the greater and lesser trochanters of the proximal right femur. A surgical drain terminates in this region. A single surgical clip overlies the scrotum. Foley catheter noted. IMPRESSION: Interval removal of prior total right hip arthroplasty hardware with antibiotic cement placed in the  region of the right femoroacetabular joint. Electronically Signed   By: Yvonne Kendall M.D.   On: 06/06/2022 16:21   [place] EKG SITE RITE  Result Date: 06/06/2022 If Site Rite image not attached, placement could not be confirmed due to current cardiac rhythm.  CT Cervical Spine Wo Contrast  Result Date: 06/05/2022 CLINICAL DATA:  Trauma fall EXAM: CT CERVICAL SPINE WITHOUT CONTRAST TECHNIQUE: Multidetector CT imaging of the cervical spine was performed without intravenous contrast. Multiplanar CT image reconstructions were also generated. RADIATION DOSE REDUCTION: This exam was performed according to the departmental dose-optimization program which includes automated exposure control, adjustment of the mA and/or kV according to patient size and/or use of iterative reconstruction technique. COMPARISON:  None Available. FINDINGS: Alignment: Mild reversal of cervical lordosis. Trace anterolisthesis C7 on T1. Facet alignment is within normal limits. Skull base and vertebrae: No acute fracture. No primary bone lesion or focal pathologic process. Soft tissues and spinal canal: No prevertebral fluid or swelling. No visible canal hematoma. Disc levels: Multilevel degenerative change. Ankylosis C5-C6. Severe degenerative change C4-C5 and C6-C7. Facet degenerative change at multiple levels with fusion of the left facets C4 through C6. Upper chest: Negative. Other: None IMPRESSION: Reversal of cervical lordosis with trace anterolisthesis C7 on T1, likely degenerative. No acute osseous abnormality. Advanced multilevel degenerative change Electronically Signed   By: Donavan Foil M.D.   On: 06/05/2022 18:05   CT Head Wo Contrast  Result Date: 06/05/2022 CLINICAL DATA:  Fall 3 days ago.  Head trauma with headache EXAM: CT HEAD WITHOUT CONTRAST CT CERVICAL SPINE WITHOUT CONTRAST TECHNIQUE: Multidetector CT imaging of the head and cervical spine was performed following the standard protocol without intravenous contrast.  Multiplanar CT image reconstructions of the cervical spine were also generated. RADIATION DOSE REDUCTION: This exam was performed according to the departmental dose-optimization program which includes automated exposure control, adjustment of the mA and/or kV according to patient size and/or use of iterative reconstruction technique. COMPARISON:  CT head 04/16/2022 FINDINGS: CT HEAD FINDINGS Brain: Generalized atrophy. Mild white matter hypodensity. Small chronic infarct right parietal lobe. Negative for acute infarct, hemorrhage, mass Vascular: Negative for hyperdense vessel Skull: Negative Sinuses/Orbits: Paranasal sinuses clear. Bilateral cataract extraction Other: None CT CERVICAL SPINE FINDINGS Alignment: 3 mm anterolisthesis C7-T1 otherwise normal alignment. Mild scoliosis. Skull base and vertebrae: Negative for fracture Soft tissues and spinal canal: No soft tissue mass or edema. Atherosclerotic calcification carotid artery bilaterally Disc levels: Cervical spondylosis. Disc degeneration and facet degeneration throughout the cervical spine. Mild spinal stenosis C5-6 and moderate spinal stenosis C6-7 Upper chest: Lung apices clear bilaterally Other: None IMPRESSION: No acute intracranial abnormality. Atrophy and chronic microvascular ischemia. Negative for cervical spine fracture. Cervical spondylosis. Electronically Signed   By: Franchot Gallo M.D.   On: 06/05/2022 18:01    EKG: Orders placed or performed during the hospital encounter of 05/16/21   EKG 12 lead per protocol   EKG 12 lead per protocol     Hospital Course: BRENDYN MCLAREN is a 87  y.o. who was admitted to [facility]. They were brought to the operating room on 06/06/2022 and underwent Procedure(s): EXCISIONAL TOTAL HIP ARTHROPLASTY WITH ANTIBIOTIC SPACERS.  Patient tolerated the procedure well and was later transferred to the recovery room and then to the orthopaedic floor for postoperative care. They were given PO and IV  analgesics for pain control following their surgery. They were given 24 hours of postoperative antibiotics of  Anti-infectives (From admission, onward)    Start     Dose/Rate Route Frequency Ordered Stop   06/09/22 0000  daptomycin (CUBICIN) IVPB        500 mg Intravenous Every 24 hours 06/07/22 1453 07/19/22 2359   06/09/22 0000  cefTRIAXone (ROCEPHIN) IVPB        2 g Intravenous Every 24 hours 06/07/22 1453 07/19/22 2359   06/08/22 1000  vancomycin (VANCOCIN) IVPB 1000 mg/200 mL premix  Status:  Discontinued        1,000 mg 200 mL/hr over 60 Minutes Intravenous Every 24 hours 06/07/22 1028 06/07/22 1329   06/08/22 1000  DAPTOmycin (CUBICIN) 500 mg in sodium chloride 0.9 % IVPB        500 mg 120 mL/hr over 30 Minutes Intravenous Every 24 hours 06/07/22 1329     06/07/22 1400  cefTRIAXone (ROCEPHIN) 2 g in sodium chloride 0.9 % 100 mL IVPB        2 g 200 mL/hr over 30 Minutes Intravenous Daily 06/07/22 1015     06/07/22 1200  vancomycin (VANCOREADY) IVPB 1500 mg/300 mL        1,500 mg 150 mL/hr over 120 Minutes Intravenous  Once 06/07/22 1028 06/07/22 1441   06/06/22 1600  ceFAZolin (ANCEF) IVPB 2g/100 mL premix        2 g 200 mL/hr over 30 Minutes Intravenous Every 6 hours 06/06/22 1510 06/06/22 2300   06/06/22 1232  vancomycin (VANCOCIN) powder  Status:  Discontinued          As needed 06/06/22 1232 06/06/22 1458   06/06/22 1227  tobramycin (NEBCIN) powder  Status:  Discontinued          As needed 06/06/22 1227 06/06/22 1458   06/06/22 0745  ceFAZolin (ANCEF) IVPB 2g/100 mL premix        2 g 200 mL/hr over 30 Minutes Intravenous On call to O.R. 06/06/22 8502 06/06/22 1105      and started on DVT prophylaxis in the form of  Eliquis .   PT and OT were ordered for total joint protocol. Discharge planning consulted to help with postop disposition and equipment needs. Patient had a fair night on the evening of surgery. He received 2 units PRBC between the OR and PACU. They started to get  up OOB with therapy on POD #1 but was limited with pain and TDWB precautions. Infectious disease was consulted, and recommendations for empiric antibiotic treatment made. Continued to work with therapy into POD #2. PICC placed. Continued working with PT on POD #3 and #4 but still with very limited mobility. Decision was made for SNF Placement. XR obtained due to increased pain which showed cement spacer in place with no acute findings. Worked with PT on POD #5.  Pt was seen during rounds on day six and was ready to go home pending progress with therapy. Pt worked with therapy for 2 additional sessions and was meeting their goals. He was discharged to SNF later that day in stable condition.  Diet: Regular diet Activity: TDWB Follow-up: in  2 weeks Disposition: Nursing Home Discharged Condition: good   Discharge Instructions     [facility] pharmacist to adjust dose for Vancomycin, Aminoglycosides and other anti-infective therapies as requested by physician.   Complete by: As directed    [facility] to provide Cath Flo 18m   Complete by: As directed    Administer for PICC line occlusion and as ordered by physician for other access device issues.   Anaphylaxis Kit: Provided to treat any anaphylactic reaction to the medication being provided to the patient if First Dose or when requested by physician   Complete by: As directed    Epinephrine 159mml vial / amp: Administer 0.58m52m0.58ml41mubcutaneously once for moderate to severe anaphylaxis, nurse to call physician and pharmacy when reaction occurs and call 911 if needed for immediate care   Diphenhydramine 50mg66mIV vial: Administer 25-50mg 58mM PRN for first dose reaction, rash, itching, mild reaction, nurse to call physician and pharmacy when reaction occurs   Sodium Chloride 0.9% NS 500ml I14mdminister if needed for hypovolemic blood pressure drop or as ordered by physician after call to physician with anaphylactic  reaction   Call MD / Call 911   Complete by: As directed    If you experience chest pain or shortness of breath, CALL 911 and be transported to the hospital emergency room.  If you develope a fever above 101 F, pus (white drainage) or increased drainage or redness at the wound, or calf pain, call your surgeon's office.   Change dressing   Complete by: As directed    Maintain surgical dressing until follow up in the clinic. If the edges start to pull up, may reinforce with tape. If the dressing is no longer working, may remove and cover with gauze and tape, but must keep the area dry and clean.  Call with any questions or concerns.   Change dressing on IV access line weekly and PRN   Complete by: As directed    Constipation Prevention   Complete by: As directed    Drink plenty of fluids.  Prune juice may be helpful.  You may use a stool softener, such as Colace (over the counter) 100 mg twice a day.  Use MiraLax (over the counter) for constipation as needed.   Diet - low sodium heart healthy   Complete by: As directed    Flush IV access with Sodium Chloride 0.9% and Heparin 10 units/ml or 100 units/ml   Complete by: As directed    Home infusion instructions - [facility]   Complete by: As directed    Instructions: Flush IV access with Sodium Chloride 0.9% and Heparin 10units/ml or 100units/ml   Change dressing on IV access line: Weekly and PRN   Instructions Cath Flo 2mg: Ad72mister for PICC Line occlusion and as ordered by physician for other access device   [facility] pharmacist to adjust dose for: Vancomycin, Aminoglycosides and other anti-infective therapies as requested by physician   Increase activity slowly as tolerated   Complete by: As directed    Other:  Touch down weight bearing   Method of administration may be changed at the discretion of home infusion pharmacist based upon assessment of the patient and/or caregiver's ability to self-administer the  medication ordered   Complete by: As directed    Post-operative opioid taper instructions:   Complete by: As directed    POST-OPERATIVE OPIOID TAPER INSTRUCTIONS: It is important to wean off of your opioid medication  as soon as possible. If you do not need pain medication after your surgery it is ok to stop day one. Opioids include: Codeine, Hydrocodone(Norco, Vicodin), Oxycodone(Percocet, oxycontin) and hydromorphone amongst others.  Long term and even short term use of opiods can cause: Increased pain response Dependence Constipation Depression Respiratory depression And more.  Withdrawal symptoms can include Flu like symptoms Nausea, vomiting And more Techniques to manage these symptoms Hydrate well Eat regular healthy meals Stay active Use relaxation techniques(deep breathing, meditating, yoga) Do Not substitute Alcohol to help with tapering If you have been on opioids for less than two weeks and do not have pain than it is ok to stop all together.  Plan to wean off of opioids This plan should start within one week post op of your joint replacement. Maintain the same interval or time between taking each dose and first decrease the dose.  Cut the total daily intake of opioids by one tablet each day Next start to increase the time between doses. The last dose that should be eliminated is the evening dose.      TED hose   Complete by: As directed    Use stockings (TED hose) for 2 weeks on both leg(s).  You may remove them at night for sleeping.      Allergies as of 06/12/2022       Reactions   Tape Dermatitis, Rash   ONLY use Paper tape   Other    Cat dander   Doxycycline Rash   Horse Epithelium Rash        Medication List     STOP taking these medications    cefadroxil 500 MG capsule Commonly known as: DURICEF   HYDROcodone-acetaminophen 5-325 MG tablet Commonly known as: NORCO/VICODIN       TAKE these medications    acetaminophen 325 MG  suppository Commonly known as: TYLENOL Place 325 mg rectally every 4 (four) hours as needed for moderate pain. What changed: Another medication with the same name was added. Make sure you understand how and when to take each.   acetaminophen 500 MG tablet Commonly known as: TYLENOL Take 2 tablets (1,000 mg total) by mouth every 6 (six) hours. What changed: You were already taking a medication with the same name, and this prescription was added. Make sure you understand how and when to take each.   Biotin 5 MG Caps Take 5 mg by mouth daily.   Calcium-Magnesium-Zinc Tabs Take 1 tablet by mouth daily.   cefTRIAXone  IVPB Commonly known as: ROCEPHIN Inject 2 g into the vein daily. Indication:  R hip PJI First Dose: Yes Last Day of Therapy:  07/19/2022 Labs - Once weekly:  CBC/D, CMP, CPK, ESR and CRP Please leave PIC in place until doctor has seen patient or been notified Fax weekly labs to [phone] Method of administration: IV Push Method of administration may be changed at the discretion of home infusion pharmacist based upon assessment of the patient and/or caregiver's ability to self-administer the medication ordered.   Cosopt PF 2-0.5 % Soln Generic drug: Dorzolamide HCl-Timolol Mal PF Place 1 drop into both eyes in the morning and at bedtime.   daptomycin  IVPB Commonly known as: CUBICIN Inject 500 mg into the vein daily. Indication:  R hip PJI First Dose: Yes Last Day of Therapy:  07/19/2022 Labs - Once weekly:  CBC/D, CMP, CPK, ESR and CRP Please leave PIC in place until doctor has seen patient or been notified Fax weekly labs  to [phone] Method of administration: IV Push Method of administration may be changed at the discretion of home infusion pharmacist based upon assessment of the patient and/or caregiver's ability to self-administer the medication ordered.   docusate sodium 100 MG capsule Commonly known as: COLACE Take 1 capsule (100 mg total) by mouth 2  (two) times daily. What changed:  when to take this reasons to take this   Eliquis 5 MG Tabs tablet Generic drug: apixaban Take 2.5 mg by mouth 2 (two) times daily.   Eszopiclone 3 MG Tabs Take 3 mg by mouth at bedtime.   furosemide 20 MG tablet Commonly known as: LASIX Take 20 mg by mouth daily as needed for fluid.   lidocaine 5 % Commonly known as: Lidoderm Place 1 patch onto the skin daily. Remove & Discard patch within 12 hours or as directed by MD   Lutein 20 MG Tabs Take 20 mg by mouth daily.   LYCOPENE PO Take 20 mg by mouth daily.   meclizine 12.5 MG tablet Commonly known as: ANTIVERT Take 1 tablet (12.5 mg total) by mouth 2 (two) times daily as needed for dizziness.   methocarbamol 500 MG tablet Commonly known as: ROBAXIN Take 1 tablet (500 mg total) by mouth every 6 (six) hours as needed for muscle spasms.   oxyCODONE 5 MG immediate release tablet Commonly known as: Oxy IR/ROXICODONE Take 1-2 tablets (5-10 mg total) by mouth every 4 (four) hours as needed for severe pain (1 tab pain score 4-6, 2 tabs pain score 7-10).   polyethylene glycol 17 g packet Commonly known as: MIRALAX / GLYCOLAX Take 17 g by mouth daily as needed for mild constipation.   potassium chloride 10 MEQ tablet Commonly known as: KLOR-CON Take 10-20 mEq by mouth daily.   ranolazine 500 MG 12 hr tablet Commonly known as: RANEXA Take 500 mg by mouth 2 (two) times daily.   SLOW RELEASE IRON PO Take 1 tablet by mouth daily.   tadalafil 5 MG tablet Commonly known as: CIALIS Take 5 mg by mouth daily as needed for erectile dysfunction.   valACYclovir 1000 MG tablet Commonly known as: VALTREX Take 1,000 mg by mouth daily as needed (fever blister).   Vitamin D 50 MCG (2000 UT) tablet Take 2,000 Units by mouth daily.   zinc gluconate 50 MG tablet Take 25 mg by mouth 2 (two) times daily.               Discharge Care Instructions  (From admission, onward)            Start     Ordered   06/12/22 0000  Change dressing       Comments: Maintain surgical dressing until follow up in the clinic. If the edges start to pull up, may reinforce with tape. If the dressing is no longer working, may remove and cover with gauze and tape, but must keep the area dry and clean.  Call with any questions or concerns.   06/12/22 0811   06/07/22 0000  Change dressing on IV access line weekly and PRN  (Home infusion instructions - [facility] )        06/07/22 1453            Contact information for follow-up providers     Paralee Cancel, MD. Schedule an appointment as soon as possible for a visit in 2 week(s).   Specialty: Orthopedic Surgery Contact information: [address] [place] [address]  [phone]         [facility] Follow up.   Why: [facility] will provide IV antibiotics in the home.        [facility] Follow up.   Specialty: Home Health Services Why: Alvis Lemmings will provide nursing in the home for PICC line maintenance and labs. Contact information: [place] [address] [phone]              Contact information for after-discharge care     Destination     [facility] .   Service: Skilled Nursing Contact information: [address] [place] [phone]                     Signed: Griffith Citron, PA-C Orthopedic Surgery 06/12/2022, 10:32 AM

## 2022-06-25 ENCOUNTER — Emergency Department (HOSPITAL_COMMUNITY): Payer: Medicare PPO

## 2022-06-25 ENCOUNTER — Ambulatory Visit: Payer: Medicare PPO | Admitting: Internal Medicine

## 2022-06-25 ENCOUNTER — Inpatient Hospital Stay (HOSPITAL_COMMUNITY)
Admission: EM | Admit: 2022-06-25 | Discharge: 2022-07-10 | DRG: 907 | Disposition: A | Discharge status: Hospice | Payer: Medicare PPO | Source: Skilled Nursing Facility | Attending: Internal Medicine | Admitting: Internal Medicine

## 2022-06-25 ENCOUNTER — Inpatient Hospital Stay: Payer: Self-pay | Admitting: Internal Medicine

## 2022-06-25 ENCOUNTER — Other Ambulatory Visit: Payer: Self-pay

## 2022-06-25 DIAGNOSIS — I480 Paroxysmal atrial fibrillation: Secondary | ICD-10-CM | POA: Diagnosis present

## 2022-06-25 DIAGNOSIS — G9340 Encephalopathy, unspecified: Secondary | ICD-10-CM | POA: Diagnosis not present

## 2022-06-25 DIAGNOSIS — E873 Alkalosis: Secondary | ICD-10-CM | POA: Diagnosis present

## 2022-06-25 DIAGNOSIS — Z96641 Presence of right artificial hip joint: Secondary | ICD-10-CM | POA: Diagnosis present

## 2022-06-25 DIAGNOSIS — I214 Non-ST elevation (NSTEMI) myocardial infarction: Secondary | ICD-10-CM | POA: Diagnosis not present

## 2022-06-25 DIAGNOSIS — D649 Anemia, unspecified: Secondary | ICD-10-CM | POA: Diagnosis present

## 2022-06-25 DIAGNOSIS — A419 Sepsis, unspecified organism: Secondary | ICD-10-CM | POA: Diagnosis not present

## 2022-06-25 DIAGNOSIS — I6523 Occlusion and stenosis of bilateral carotid arteries: Secondary | ICD-10-CM | POA: Diagnosis present

## 2022-06-25 DIAGNOSIS — Z79899 Other long term (current) drug therapy: Secondary | ICD-10-CM

## 2022-06-25 DIAGNOSIS — S7001XS Contusion of right hip, sequela: Secondary | ICD-10-CM

## 2022-06-25 DIAGNOSIS — I272 Pulmonary hypertension, unspecified: Secondary | ICD-10-CM | POA: Diagnosis not present

## 2022-06-25 DIAGNOSIS — N179 Acute kidney failure, unspecified: Secondary | ICD-10-CM | POA: Diagnosis not present

## 2022-06-25 DIAGNOSIS — R627 Adult failure to thrive: Secondary | ICD-10-CM | POA: Diagnosis present

## 2022-06-25 DIAGNOSIS — Z95828 Presence of other vascular implants and grafts: Secondary | ICD-10-CM | POA: Diagnosis not present

## 2022-06-25 DIAGNOSIS — R52 Pain, unspecified: Secondary | ICD-10-CM | POA: Diagnosis not present

## 2022-06-25 DIAGNOSIS — I472 Ventricular tachycardia, unspecified: Secondary | ICD-10-CM | POA: Diagnosis not present

## 2022-06-25 DIAGNOSIS — R6521 Severe sepsis with septic shock: Secondary | ICD-10-CM | POA: Diagnosis not present

## 2022-06-25 DIAGNOSIS — Z8249 Family history of ischemic heart disease and other diseases of the circulatory system: Secondary | ICD-10-CM

## 2022-06-25 DIAGNOSIS — T8484XA Pain due to internal orthopedic prosthetic devices, implants and grafts, initial encounter: Secondary | ICD-10-CM | POA: Diagnosis not present

## 2022-06-25 DIAGNOSIS — I340 Nonrheumatic mitral (valve) insufficiency: Secondary | ICD-10-CM | POA: Diagnosis present

## 2022-06-25 DIAGNOSIS — R451 Restlessness and agitation: Secondary | ICD-10-CM | POA: Diagnosis not present

## 2022-06-25 DIAGNOSIS — F419 Anxiety disorder, unspecified: Secondary | ICD-10-CM | POA: Diagnosis present

## 2022-06-25 DIAGNOSIS — Z515 Encounter for palliative care: Secondary | ICD-10-CM

## 2022-06-25 DIAGNOSIS — R54 Age-related physical debility: Secondary | ICD-10-CM | POA: Diagnosis present

## 2022-06-25 DIAGNOSIS — I10 Essential (primary) hypertension: Secondary | ICD-10-CM | POA: Diagnosis present

## 2022-06-25 DIAGNOSIS — Y838 Other surgical procedures as the cause of abnormal reaction of the patient, or of later complication, without mention of misadventure at the time of the procedure: Secondary | ICD-10-CM | POA: Diagnosis present

## 2022-06-25 DIAGNOSIS — E785 Hyperlipidemia, unspecified: Secondary | ICD-10-CM | POA: Insufficient documentation

## 2022-06-25 DIAGNOSIS — Z66 Do not resuscitate: Secondary | ICD-10-CM | POA: Diagnosis present

## 2022-06-25 DIAGNOSIS — R4589 Other symptoms and signs involving emotional state: Secondary | ICD-10-CM | POA: Diagnosis not present

## 2022-06-25 DIAGNOSIS — T45515A Adverse effect of anticoagulants, initial encounter: Secondary | ICD-10-CM | POA: Diagnosis present

## 2022-06-25 DIAGNOSIS — L8990 Pressure ulcer of unspecified site, unspecified stage: Secondary | ICD-10-CM | POA: Insufficient documentation

## 2022-06-25 DIAGNOSIS — Z9911 Dependence on respirator [ventilator] status: Secondary | ICD-10-CM | POA: Diagnosis not present

## 2022-06-25 DIAGNOSIS — D62 Acute posthemorrhagic anemia: Secondary | ICD-10-CM | POA: Diagnosis present

## 2022-06-25 DIAGNOSIS — L89311 Pressure ulcer of right buttock, stage 1: Secondary | ICD-10-CM | POA: Diagnosis present

## 2022-06-25 DIAGNOSIS — Y792 Prosthetic and other implants, materials and accessory orthopedic devices associated with adverse incidents: Secondary | ICD-10-CM | POA: Diagnosis present

## 2022-06-25 DIAGNOSIS — D6832 Hemorrhagic disorder due to extrinsic circulating anticoagulants: Secondary | ICD-10-CM | POA: Diagnosis present

## 2022-06-25 DIAGNOSIS — Z1152 Encounter for screening for COVID-19: Secondary | ICD-10-CM | POA: Diagnosis not present

## 2022-06-25 DIAGNOSIS — Y95 Nosocomial condition: Secondary | ICD-10-CM | POA: Diagnosis present

## 2022-06-25 DIAGNOSIS — J189 Pneumonia, unspecified organism: Secondary | ICD-10-CM | POA: Insufficient documentation

## 2022-06-25 DIAGNOSIS — E43 Unspecified severe protein-calorie malnutrition: Secondary | ICD-10-CM | POA: Diagnosis present

## 2022-06-25 DIAGNOSIS — R531 Weakness: Secondary | ICD-10-CM | POA: Diagnosis not present

## 2022-06-25 DIAGNOSIS — Z7901 Long term (current) use of anticoagulants: Secondary | ICD-10-CM

## 2022-06-25 DIAGNOSIS — G8929 Other chronic pain: Secondary | ICD-10-CM | POA: Diagnosis present

## 2022-06-25 DIAGNOSIS — R601 Generalized edema: Secondary | ICD-10-CM | POA: Diagnosis not present

## 2022-06-25 DIAGNOSIS — J9601 Acute respiratory failure with hypoxia: Secondary | ICD-10-CM | POA: Diagnosis not present

## 2022-06-25 DIAGNOSIS — M96840 Postprocedural hematoma of a musculoskeletal structure following a musculoskeletal system procedure: Principal | ICD-10-CM | POA: Diagnosis present

## 2022-06-25 DIAGNOSIS — I252 Old myocardial infarction: Secondary | ICD-10-CM

## 2022-06-25 DIAGNOSIS — I509 Heart failure, unspecified: Secondary | ICD-10-CM | POA: Diagnosis not present

## 2022-06-25 DIAGNOSIS — Z981 Arthrodesis status: Secondary | ICD-10-CM

## 2022-06-25 DIAGNOSIS — E876 Hypokalemia: Secondary | ICD-10-CM | POA: Diagnosis not present

## 2022-06-25 DIAGNOSIS — L89626 Pressure-induced deep tissue damage of left heel: Secondary | ICD-10-CM | POA: Diagnosis not present

## 2022-06-25 DIAGNOSIS — G928 Other toxic encephalopathy: Secondary | ICD-10-CM | POA: Diagnosis not present

## 2022-06-25 DIAGNOSIS — E440 Moderate protein-calorie malnutrition: Secondary | ICD-10-CM | POA: Insufficient documentation

## 2022-06-25 DIAGNOSIS — M96842 Postprocedural seroma of a musculoskeletal structure following a musculoskeletal system procedure: Secondary | ICD-10-CM | POA: Diagnosis present

## 2022-06-25 DIAGNOSIS — Z881 Allergy status to other antibiotic agents status: Secondary | ICD-10-CM

## 2022-06-25 DIAGNOSIS — Z7189 Other specified counseling: Secondary | ICD-10-CM

## 2022-06-25 DIAGNOSIS — N400 Enlarged prostate without lower urinary tract symptoms: Secondary | ICD-10-CM | POA: Diagnosis present

## 2022-06-25 DIAGNOSIS — M25552 Pain in left hip: Secondary | ICD-10-CM | POA: Diagnosis present

## 2022-06-25 DIAGNOSIS — R0602 Shortness of breath: Secondary | ICD-10-CM | POA: Diagnosis not present

## 2022-06-25 DIAGNOSIS — G9341 Metabolic encephalopathy: Secondary | ICD-10-CM | POA: Diagnosis not present

## 2022-06-25 DIAGNOSIS — R34 Anuria and oliguria: Secondary | ICD-10-CM | POA: Diagnosis not present

## 2022-06-25 DIAGNOSIS — L89321 Pressure ulcer of left buttock, stage 1: Secondary | ICD-10-CM | POA: Diagnosis present

## 2022-06-25 DIAGNOSIS — E875 Hyperkalemia: Secondary | ICD-10-CM | POA: Diagnosis not present

## 2022-06-25 DIAGNOSIS — I4891 Unspecified atrial fibrillation: Secondary | ICD-10-CM | POA: Diagnosis not present

## 2022-06-25 DIAGNOSIS — Z6820 Body mass index (BMI) 20.0-20.9, adult: Secondary | ICD-10-CM

## 2022-06-25 LAB — MRSA NEXT GEN BY PCR, NASAL: MRSA by PCR Next Gen: NOT DETECTED

## 2022-06-25 LAB — CBC WITH DIFFERENTIAL/PLATELET
Abs Immature Granulocytes: 0.07 10*3/uL (ref 0.00–0.07)
Basophils Absolute: 0 10*3/uL (ref 0.0–0.1)
Basophils Relative: 0 %
Eosinophils Absolute: 0.1 10*3/uL (ref 0.0–0.5)
Eosinophils Relative: 1 %
HCT: 18.8 % — ABNORMAL LOW (ref 39.0–52.0)
Hemoglobin: 5.9 g/dL — CL (ref 13.0–17.0)
Immature Granulocytes: 1 %
Lymphocytes Relative: 5 %
Lymphs Abs: 0.4 10*3/uL — ABNORMAL LOW (ref 0.7–4.0)
MCH: 32.2 pg (ref 26.0–34.0)
MCHC: 31.4 g/dL (ref 30.0–36.0)
MCV: 102.7 fL — ABNORMAL HIGH (ref 80.0–100.0)
Monocytes Absolute: 0.5 10*3/uL (ref 0.1–1.0)
Monocytes Relative: 7 %
Neutro Abs: 6.5 10*3/uL (ref 1.7–7.7)
Neutrophils Relative %: 86 %
Platelets: 249 10*3/uL (ref 150–400)
RBC: 1.83 MIL/uL — ABNORMAL LOW (ref 4.22–5.81)
RDW: 18.6 % — ABNORMAL HIGH (ref 11.5–15.5)
WBC: 7.5 10*3/uL (ref 4.0–10.5)
nRBC: 0 % (ref 0.0–0.2)

## 2022-06-25 LAB — BASIC METABOLIC PANEL
Anion gap: 6 (ref 5–15)
BUN: 31 mg/dL — ABNORMAL HIGH (ref 8–23)
CO2: 24 mmol/L (ref 22–32)
Calcium: 7.9 mg/dL — ABNORMAL LOW (ref 8.9–10.3)
Chloride: 105 mmol/L (ref 98–111)
Creatinine, Ser: 1.06 mg/dL (ref 0.61–1.24)
GFR, Estimated: >60 mL/min (ref >60)
Glucose, Bld: 110 mg/dL — ABNORMAL HIGH (ref 70–99)
Potassium: 4.3 mmol/L (ref 3.5–5.1)
Sodium: 135 mmol/L (ref 135–145)

## 2022-06-25 LAB — RESP PANEL BY RT-PCR (RSV, FLU A&B, COVID)  RVPGX2
Influenza A by PCR: NEGATIVE
Influenza B by PCR: NEGATIVE
Resp Syncytial Virus by PCR: NEGATIVE
SARS Coronavirus 2 by RT PCR: NEGATIVE

## 2022-06-25 LAB — HEMOGLOBIN AND HEMATOCRIT, BLOOD
HCT: 25.8 % — ABNORMAL LOW (ref 39.0–52.0)
Hemoglobin: 8.1 g/dL — ABNORMAL LOW (ref 13.0–17.0)

## 2022-06-25 LAB — ALBUMIN: Albumin: 1.9 g/dL — ABNORMAL LOW (ref 3.5–5.0)

## 2022-06-25 LAB — PREPARE RBC (CROSSMATCH)

## 2022-06-25 LAB — PROTIME-INR
INR: 1.7 — ABNORMAL HIGH (ref 0.8–1.2)
Prothrombin Time: 20.2 seconds — ABNORMAL HIGH (ref 11.4–15.2)

## 2022-06-25 LAB — POC OCCULT BLOOD, ED: Fecal Occult Bld: NEGATIVE

## 2022-06-25 MED ORDER — VANCOMYCIN HCL IN DEXTROSE 1-5 GM/200ML-% IV SOLN
1000.0000 mg | INTRAVENOUS | Status: DC
  Administered 2022-06-26: 1000 mg via INTRAVENOUS
  Filled 2022-06-25: qty 200

## 2022-06-25 MED ORDER — OXYCODONE HCL 5 MG PO TABS
5.0000 mg | ORAL_TABLET | ORAL | Status: DC | PRN
  Administered 2022-06-25 – 2022-06-27 (×5): 10 mg via ORAL
  Filled 2022-06-25 (×5): qty 2

## 2022-06-25 MED ORDER — FUROSEMIDE 10 MG/ML IJ SOLN
20.0000 mg | Freq: Once | INTRAMUSCULAR | Status: AC
  Administered 2022-06-25: 20 mg via INTRAVENOUS
  Filled 2022-06-25: qty 4

## 2022-06-25 MED ORDER — HYDROMORPHONE HCL 1 MG/ML IJ SOLN
0.2500 mg | Freq: Once | INTRAMUSCULAR | Status: AC
  Administered 2022-06-25: 0.25 mg via INTRAVENOUS
  Filled 2022-06-25: qty 1

## 2022-06-25 MED ORDER — SODIUM CHLORIDE 0.9 % IV SOLN
2.0000 g | Freq: Two times a day (BID) | INTRAVENOUS | Status: DC
  Administered 2022-06-25 – 2022-06-26 (×2): 2 g via INTRAVENOUS
  Filled 2022-06-25 (×2): qty 12.5

## 2022-06-25 MED ORDER — SODIUM CHLORIDE 0.9% IV SOLUTION: Freq: Once | INTRAVENOUS | Status: AC

## 2022-06-25 MED ORDER — VANCOMYCIN HCL 1500 MG/300ML IV SOLN
1500.0000 mg | Freq: Once | INTRAVENOUS | Status: AC
  Administered 2022-06-25: 1500 mg via INTRAVENOUS
  Filled 2022-06-25: qty 300

## 2022-06-25 NOTE — ED Triage Notes (Signed)
Patient brought in by EMS from [facility]. He is at facility for recent R hip surgery. He was DX with Pneumonia Sunday, facility completed blood work yesterday and his hemoglobin was 6.5, facility sent patient out for evaluation. He is on daily eliquis and has PICC to R arm. Hemovac to R hip.  108/56 78 20 94-96% 3L 99.1

## 2022-06-25 NOTE — H&P (Signed)
History and Physical    Patient: Travis Wolfe OFB:[id number] DOB: [birth year] DOA: 06/25/2022 DOS: the patient was seen and examined on 06/25/2022 PCP: Derrill Center., MD  Patient coming from: SNF  Chief Complaint:  Chief Complaint  Patient presents with   Weakness    Low hemoglobin 6.5 at facility    HPI: Travis Wolfe is a 88 y.o. male with medical history significant of A fib, HLD, HTN. Presenting with weakness. He reports that he's had swelling in his arms and legs over the last few days. In addition, his wound vac and been draining excessively. He has felt weak and short of breath. His facility drew labs and found that his hemoglobin had a significant drop. They decided to send him to the ED for evaluation.    Review of Systems: As mentioned in the history of present illness. All other systems reviewed and are negative. Past Medical History:  Diagnosis Date   A-fib ([place])    REPORTS WAS DUE TO DEHYDRATION 3 YEARS AGO BUT WAS GIVEN FLUIDS IN ED AND MEDS TO SLOW HR AND DROVE HIMSELF HOME  ; HAD ABLATION X2 WITH CARDIO DR. AKBARY AS PRECAUTION  ; has been on eliquis since but reports he has not taken it for 5 weeks b/c he was told to hold it for the multiple irrigations and debridements for the hip infection. reports his cardiologist is aware he has been holding it;    Arthritis    Atherosclerosis of both carotid arteries    BPH (benign prostatic hyperplasia)    Dysrhythmia    a-fib   Fall    fell on friday 01-16-18;   lost balAnce whIile tryng to get his sock on ;sustained skin tear to left wrist ; no drainage  , scabbed over , and has been covering with bandage    Heart murmur    SINCE HIS 20s   HLD (hyperlipidemia)    Hypertension    Non-rheumatic mitral regurgitation    Past Surgical History:  Procedure Laterality Date   BACK SURGERY  2007   L1-L5 LUMBAR WITH HARDWARE PLACED   CARDIOVERSION     CATARACT EXTRACTION, BILATERAL  2015   CRYOABLATION  2023   CARDIAC  ABLATION and 2019   DACROCYSTORHINOSTOMY  07/26/2011   EXCISIONAL TOTAL HIP ARTHROPLASTY WITH ANTIBIOTIC SPACERS Right 06/06/2022   Procedure: EXCISIONAL TOTAL HIP ARTHROPLASTY WITH ANTIBIOTIC SPACERS;  Surgeon: Paralee Cancel, MD;  Location: [facility];  Service: Orthopedics;  Laterality: Right;   HEMATOMA EVACUATION Right 01/31/2022   Procedure: Evacuation of right hip hematoma;  Surgeon: Paralee Cancel, MD;  Location: [facility];  Service: Orthopedics;  Laterality: Right;  90 mins   HEMATOMA EVACUATION Right 05/02/2022   Procedure: Evacuation of right hip Seroma;  Surgeon: Paralee Cancel, MD;  Location: [facility];  Service: Orthopedics;  Laterality: Right;   HERNIA REPAIR Bilateral    hx of echocardiogram      INCISION AND DRAINAGE HIP Right 01/26/2018   Procedure: Right hip irrigation and debridement, excisional and non excisional debridement, head liner exchange, posterior approach;  Surgeon: Paralee Cancel, MD;  Location: [facility];  Service: Orthopedics;  Laterality: Right;  90 mins Would like to start earlier around 2:00pm if time opens   [place] Right 11/26/2018   Procedure: IRRIGATION AND DEBRIDEMENT HIP;  Surgeon: Paralee Cancel, MD;  Location: [facility];  Service: Orthopedics;  Laterality: Right;  90 mins   INCISION AND DRAINAGE HIP Right 05/17/2021  Procedure: IRRIGATION AND DEBRIDEMENT RIGHT HIP, EVACUATION OF PSEUDOTUMOR;  Surgeon: Durene Romans, MD;  Location: [facility];  Service: Orthopedics;  Laterality: Right;   INGUINAL HERNIA REPAIR Bilateral    with mesh    IR US GUIDE BX ASP/DRAIN  12/17/2017   IRRIGATION AND ASPIRATION RIGHT HIP   12/10/2017   [facility]  x2, [facility] x1    KIDNEY STONE SURGERY     lithotripsy    KYPHOPLASTY     T7   LATERAL FUSION LUMBAR SPINE     L1-L5   PULMONARY VEIN ISOLATION AND LEFT ATRIAL ROOFLINE ABLATION   10/15/2017   DR Rudolpho Sevin    ROTATOR CUFF REPAIR Left    SHOULDER DEBRIDEMENT Right    SHOULDER OPEN ROTATOR CUFF REPAIR Left    SHOULDER  SURGERY  1988   arthroscopy     TONSILLECTOMY     TOTAL HIP ARTHROPLASTY Right 2003   TOTAL HIP ARTHROPLASTY  2010   TOTAL HIP REVISION      05-2017, 10-2016   Social History:  reports that he has never smoked. He has never used smokeless tobacco. He reports current alcohol use of about 3.0 standard drinks of alcohol per week. He reports that he does not use drugs.  Allergies  Allergen Reactions   Tape Dermatitis and Rash    ONLY use Paper tape   Other     Cat dander   Doxycycline Rash   Horse Epithelium Rash    Family History  Problem Relation Age of Onset   Hypertension Mother    Cancer Mother     Prior to Admission medications   Medication Sig Start Date End Date Taking? Authorizing Provider  acetaminophen (TYLENOL) 325 MG suppository Place 325 mg rectally every 4 (four) hours as needed for moderate pain.    [provider]  acetaminophen (TYLENOL) 500 MG tablet Take 2 tablets (1,000 mg total) by mouth every 6 (six) hours. 06/12/22   Cassandria Anger, PA-C  apixaban (ELIQUIS) 5 MG TABS tablet Take 2.5 mg by mouth 2 (two) times daily.    [provider]  Biotin 5 MG CAPS Take 5 mg by mouth daily.    [provider]  cefTRIAXone (ROCEPHIN) IVPB Inject 2 g into the vein daily. Indication:  R hip PJI First Dose: Yes Last Day of Therapy:  07/19/2022 Labs - Once weekly:  CBC/D, CMP, CPK, ESR and CRP Please leave PIC in place until doctor has seen patient or been notified Fax weekly labs to [phone] Method of administration: IV Push Method of administration may be changed at the discretion of home infusion pharmacist based upon assessment of the patient and/or caregiver's ability to self-administer the medication ordered. 06/09/22 07/19/22  Cassandria Anger, PA-C  Cholecalciferol (VITAMIN D) 50 MCG (2000 UT) tablet Take 2,000 Units by mouth daily.    [provider]  COSOPT PF 22.3-6.8 MG/ML SOLN ophthalmic solution Place 1 drop into both  eyes in the morning and at bedtime. 10/22/17   [provider]  daptomycin (CUBICIN) IVPB Inject 500 mg into the vein daily. Indication:  R hip PJI First Dose: Yes Last Day of Therapy:  07/19/2022 Labs - Once weekly:  CBC/D, CMP, CPK, ESR and CRP Please leave PIC in place until doctor has seen patient or been notified Fax weekly labs to [phone] Method of administration: IV Push Method of administration may be changed at the discretion of home infusion pharmacist based upon assessment of the  patient and/or caregiver's ability to self-administer the medication ordered. 06/09/22 07/19/22  Cassandria Anger, PA-C  docusate sodium (COLACE) 100 MG capsule Take 1 capsule (100 mg total) by mouth 2 (two) times daily. Patient taking differently: Take 100 mg by mouth daily as needed. 05/03/22   Cassandria Anger, PA-C  Eszopiclone 3 MG TABS Take 3 mg by mouth at bedtime. 02/12/18   [provider]  Ferrous Sulfate (SLOW RELEASE IRON PO) Take 1 tablet by mouth daily.    [provider]  furosemide (LASIX) 20 MG tablet Take 20 mg by mouth daily as needed for fluid. 12/07/21   [provider]  lidocaine (LIDODERM) 5 % Place 1 patch onto the skin daily. Remove & Discard patch within 12 hours or as directed by MD 06/05/22   Elayne Snare K, DO  Lutein 20 MG TABS Take 20 mg by mouth daily.    [provider]  LYCOPENE PO Take 20 mg by mouth daily.    [provider]  meclizine (ANTIVERT) 12.5 MG tablet Take 1 tablet (12.5 mg total) by mouth 2 (two) times daily as needed for dizziness. 06/12/22   Cassandria Anger, PA-C  methocarbamol (ROBAXIN) 500 MG tablet Take 1 tablet (500 mg total) by mouth every 6 (six) hours as needed for muscle spasms. 06/12/22   Cassandria Anger, PA-C  Multiple Minerals (CALCIUM-MAGNESIUM-ZINC) TABS Take 1 tablet by mouth daily.    [provider]  oxyCODONE (OXY IR/ROXICODONE) 5 MG immediate release tablet Take 1-2  tablets (5-10 mg total) by mouth every 4 (four) hours as needed for severe pain (1 tab pain score 4-6, 2 tabs pain score 7-10). 06/12/22   Cassandria Anger, PA-C  polyethylene glycol (MIRALAX / GLYCOLAX) 17 g packet Take 17 g by mouth daily as needed for mild constipation. Patient not taking: Reported on 04/18/2022 05/18/21   Cassandria Anger, PA-C  potassium chloride (KLOR-CON) 10 MEQ tablet Take 10-20 mEq by mouth daily. 12/15/21   [provider]  ranolazine (RANEXA) 500 MG 12 hr tablet Take 500 mg by mouth 2 (two) times daily. 04/30/22   [provider]  tadalafil (CIALIS) 5 MG tablet Take 5 mg by mouth daily as needed for erectile dysfunction. 02/02/22   [provider]  valACYclovir (VALTREX) 1000 MG tablet Take 1,000 mg by mouth daily as needed (fever blister). 04/11/21   [provider]  zinc gluconate 50 MG tablet Take 25 mg by mouth 2 (two) times daily.    [provider]    Physical Exam: Vitals:   06/25/22 1300 06/25/22 1420 06/25/22 1523 06/25/22 1538  BP: 119/72 (!) 139/51 (!) 125/57 (!) 112/49  Pulse: 71 77 76 74  Resp: 17 13 14 16   Temp:  98.4 F (36.9 C) 98.8 F (37.1 C) 98.4 F (36.9 C)  TempSrc:  Oral Oral Oral  SpO2: 98% 96% 97% 96%  Weight:      Height:       General: 88 y.o. male resting in bed in NAD Eyes: PERRL, normal sclera ENMT: Nares patent w/o discharge, orophaynx clear, dentition normal, ears w/o discharge/lesions/ulcers Neck: Supple, trachea midline Cardiovascular: RRR, +S1, S2, 3/6 SEM, no g/r, equal pulses throughout Respiratory: central soft rhonchi, no w/r, normal WOB on 2L [place] GI: BS+, NDNT, no masses noted, no organomegaly noted MSK: No c/c; BLE 3+ edema, BUE edema; right hip wound drain in place Neuro: A&O x 3, no focal deficits Psyc: Appropriate interaction and affect,  calm/cooperative  Data Reviewed:  Results for orders placed or performed during the hospital encounter of 06/25/22 (from the past 24  hour(s))  Resp panel by RT-PCR (RSV, Flu A&B, Covid) Anterior Nasal Swab     Status: None   Collection Time: 06/25/22  9:51 AM   Specimen: Anterior Nasal Swab  Result Value Ref Range   SARS Coronavirus 2 by RT PCR NEGATIVE NEGATIVE   Influenza A by PCR NEGATIVE NEGATIVE   Influenza B by PCR NEGATIVE NEGATIVE   Resp Syncytial Virus by PCR NEGATIVE NEGATIVE  CBC with Differential     Status: Abnormal   Collection Time: 06/25/22  9:57 AM  Result Value Ref Range   WBC 7.5 4.0 - 10.5 K/uL   RBC 1.83 (L) 4.22 - 5.81 MIL/uL   Hemoglobin 5.9 (LL) 13.0 - 17.0 g/dL   HCT 63.8 (L) 93.7 - 34.2 %   MCV 102.7 (H) 80.0 - 100.0 fL   MCH 32.2 26.0 - 34.0 pg   MCHC 31.4 30.0 - 36.0 g/dL   RDW 87.6 (H) 81.1 - 57.2 %   Platelets 249 150 - 400 K/uL   nRBC 0.0 0.0 - 0.2 %   Neutrophils Relative % 86 %   Neutro Abs 6.5 1.7 - 7.7 K/uL   Lymphocytes Relative 5 %   Lymphs Abs 0.4 (L) 0.7 - 4.0 K/uL   Monocytes Relative 7 %   Monocytes Absolute 0.5 0.1 - 1.0 K/uL   Eosinophils Relative 1 %   Eosinophils Absolute 0.1 0.0 - 0.5 K/uL   Basophils Relative 0 %   Basophils Absolute 0.0 0.0 - 0.1 K/uL   Immature Granulocytes 1 %   Abs Immature Granulocytes 0.07 0.00 - 0.07 K/uL  Basic metabolic panel     Status: Abnormal   Collection Time: 06/25/22  9:57 AM  Result Value Ref Range   Sodium 135 135 - 145 mmol/L   Potassium 4.3 3.5 - 5.1 mmol/L   Chloride 105 98 - 111 mmol/L   CO2 24 22 - 32 mmol/L   Glucose, Bld 110 (H) 70 - 99 mg/dL   BUN 31 (H) 8 - 23 mg/dL   Creatinine, Ser 6.20 0.61 - 1.24 mg/dL   Calcium 7.9 (L) 8.9 - 10.3 mg/dL   GFR, Estimated >35 >59 mL/min   Anion gap 6 5 - 15  Protime-INR     Status: Abnormal   Collection Time: 06/25/22  9:57 AM  Result Value Ref Range   Prothrombin Time 20.2 (H) 11.4 - 15.2 seconds   INR 1.7 (H) 0.8 - 1.2  Type and screen [facility]     Status: None (Preliminary result)   Collection Time: 06/25/22  9:57 AM  Result Value Ref Range    ABO/RH(D) A POS    Antibody Screen POS    Sample Expiration 06/28/2022,2359    Antibody Identification ANTI E    DAT, IgG POS    Antibody ID,T [place] ANTI E    Unit Number [id number]    Blood Component Type RED CELLS,LR    Unit division 00    Status of Unit ALLOCATED    Donor AG Type NEGATIVE FOR E ANTIGEN    Transfusion Status OK TO TRANSFUSE    Crossmatch Result COMPATIBLE    Unit Number [id number]    Blood Component Type RED CELLS,LR    Unit division 00    Status of Unit ISSUED    Donor AG Type NEGATIVE FOR E ANTIGEN  Transfusion Status OK TO TRANSFUSE    Crossmatch Result COMPATIBLE   POC occult blood, ED     Status: None   Collection Time: 06/25/22 10:38 AM  Result Value Ref Range   Fecal Occult Bld NEGATIVE NEGATIVE  Prepare RBC (crossmatch)     Status: None   Collection Time: 06/25/22 12:00 PM  Result Value Ref Range   Order Confirmation      ORDER PROCESSED BY BLOOD BANK Performed at [facility], [address], [place], [place] [address]    CXR: New patchy alveolar infiltrates are seen in right upper lung field and left parahilar region suggesting multifocal pneumonia.  Assessment and Plan: Symptomatic anemia Right hip wound (surgical); hematoma     - admit to inpt, tele     - hold eliquis     - getting 2 units pRBCs in ED; trend H&H     - orthopedics onboard (Dr. Charlann Boxer); appreciate assistance, may have to go back to OR      Multifocal PNA     - change abx to vanc, cefepime     - PRN nebs     - guaifenesin     - wean O2 as able  Anasarca     - check albumin level     - continue lasix     - check BNP, echo  A fib HTN     - hold eliquis d/t symptomatic anemia     - continue home regimen otherwise when confirmed  HLD     - continue home regimen when confirmed  Hypocalcemia     - check albumin for Ca2+ correction; correct based on that result   Advance Care Planning:   Code Status: FULL  Consults:  Orthopedics  Family Communication: w/ wife at bedside  Severity of Illness: The appropriate patient status for this patient is INPATIENT. Inpatient status is judged to be reasonable and necessary in order to provide the required intensity of service to ensure the patient's safety. The patient's presenting symptoms, physical exam findings, and initial radiographic and laboratory data in the context of their chronic comorbidities is felt to place them at high risk for further clinical deterioration. Furthermore, it is not anticipated that the patient will be medically stable for discharge from the hospital within 2 midnights of admission.   * I certify that at the point of admission it is my clinical judgment that the patient will require inpatient hospital care spanning beyond 2 midnights from the point of admission due to high intensity of service, high risk for further deterioration and high frequency of surveillance required.*  Author: Teddy Spike, DO 06/25/2022 5:08 PM  For on call review [email or web address].

## 2022-06-25 NOTE — Progress Notes (Signed)
Pharmacy Antibiotic Note  KARMELLO ABERCROMBIE is a 88 y.o. male admitted on 06/25/2022 with symptomatic anemia. Patient on IV antibiotics (daptomycin and ceftriaxone) PTA through 07/19/22 for right hip PJI, s/p recent excision of right THA w/ placement of antibiotic spacer. Concern for multifocal pneumonia, pharmacy has been consulted for vancomycin and cefepime dosing.  Plan: -Vancomycin 1500 mg IV x 1 followed by 1000 mg IV q24h -Cefepime 2 g IV q12h -MRSA PCR ordered -Pharmacy to continue to follow renal function, cultures and clinical progress for dose adjustments or appropriate transition back to PTA antibiotics as indicated  Height: 5\' 8"  (172.7 cm) Weight: 67.1 kg (148 lb) IBW/kg (Calculated) : 68.4  Temp (24hrs), Avg:98.4 F (36.9 C), Min:98.1 F (36.7 C), Max:98.8 F (37.1 C)  Recent Labs  Lab 06/25/22 0957  WBC 7.5  CREATININE 1.06    Estimated Creatinine Clearance: 48.4 mL/min (by C-G formula based on SCr of 1.06 mg/dL).    Allergies  Allergen Reactions   Tape Dermatitis and Rash    ONLY use Paper tape   Other     Cat dander   Doxycycline Rash   Horse Epithelium Rash    Antimicrobials this admission: Cefepime 1/9 >> Vancomycin 1/9 >> Daptomycin/ceftriaxone PTA  Dose adjustments this admission: NA  Microbiology results: 1/9 MRSA PCR: ordered  Thank you for allowing pharmacy to be a part of this patient's care.  Tawnya Crook, PharmD, BCPS Clinical Pharmacist 06/25/2022 6:03 PM

## 2022-06-25 NOTE — ED Provider Notes (Addendum)
[facility]-EMERGENCY DEPT Provider Note   CSN: [id number] Arrival date & time: 06/25/22  4132     History  Chief Complaint  Patient presents with   Weakness    Low hemoglobin 6.5 at facility     Travis Wolfe is a 88 y.o. male.  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  Patient endorses fatigue, shortness of breath, generalized weakness.  His hemoglobin was found to be in the mid fives at his skilled nursing rehab facility earlier today and he was sent to the emergency department for blood transfusion.  He did speak with his orthopedist office, [facility] who are aware that he was being sent to the emergency department.  He endorses continued he output from his wound VAC.  He denies any melena or hematochezia.  He endorses a mild productive cough but is are already on treatment outpatient via PICC line with antibiotics.  Review, he is currently receiving daptomycin, Rocephin and oral azithromycin.  He remains on Eliquis for DVT prophylaxis.  Home Medications Prior to Admission medications   Medication Sig Start Date End Date Taking? Authorizing Provider  acetaminophen (TYLENOL) 325 MG suppository Place 325 mg rectally every 4 (four) hours as needed for moderate pain.    [provider]  acetaminophen (TYLENOL) 500 MG tablet Take 2  tablets (1,000 mg total) by mouth every 6 (six) hours. 06/12/22   Cassandria Anger, PA-C  apixaban (ELIQUIS) 5 MG TABS tablet Take 2.5 mg by mouth 2 (two) times daily.    [provider]  Biotin 5 MG CAPS Take 5 mg by mouth daily.    [provider]  cefTRIAXone (ROCEPHIN) IVPB Inject 2 g into the vein daily. Indication:  R hip PJI First Dose: Yes Last Day of Therapy:  07/19/2022 Labs - Once weekly:  CBC/D, CMP, CPK, ESR and CRP Please leave PIC in place until doctor has seen patient or been notified Fax weekly labs to [phone] Method of administration: IV Push Method of administration may be changed at the discretion of home infusion pharmacist based upon assessment of the patient and/or caregiver's ability to self-administer the medication ordered. 06/09/22 07/19/22  Cassandria Anger, PA-C  Cholecalciferol (VITAMIN D) 50 MCG (2000 UT) tablet Take 2,000 Units by mouth daily.    [provider]  COSOPT PF 22.3-6.8 MG/ML SOLN ophthalmic solution Place 1 drop into both eyes in the morning and at bedtime. 10/22/17   [provider]  daptomycin (CUBICIN) IVPB Inject 500 mg into the vein daily. Indication:  R hip PJI First Dose: Yes Last Day of Therapy:  07/19/2022 Labs - Once weekly:  CBC/D, CMP, CPK, ESR and CRP Please leave PIC in place until doctor has seen patient or been notified Fax weekly labs to [phone] Method of administration: IV Push Method of administration may be changed at the discretion of home infusion pharmacist based upon assessment of the patient and/or caregiver's ability to self-administer  the medication ordered. 06/09/22 07/19/22  Cassandria Anger, PA-C  docusate sodium (COLACE) 100 MG capsule Take 1 capsule (100 mg total) by mouth 2 (two) times daily. Patient taking differently: Take 100 mg by mouth daily as needed. 05/03/22   Cassandria Anger, PA-C  Eszopiclone 3 MG TABS Take 3 mg by mouth at bedtime. 02/12/18   [provider]  Ferrous Sulfate (SLOW RELEASE IRON PO) Take 1 tablet by mouth daily.    [provider]  furosemide (LASIX) 20 MG tablet Take 20 mg by mouth daily as needed for fluid. 12/07/21   [provider]  lidocaine (LIDODERM) 5 % Place 1 patch onto the skin daily. Remove & Discard patch within 12 hours or as directed by MD 06/05/22   Elayne Snare K, DO  Lutein 20 MG TABS Take 20 mg by mouth daily.    [provider]  LYCOPENE PO Take 20 mg by mouth daily.    [provider]  meclizine (ANTIVERT) 12.5 MG tablet Take 1 tablet (12.5 mg total) by mouth 2 (two) times daily as needed for dizziness. 06/12/22   Cassandria Anger, PA-C  methocarbamol (ROBAXIN) 500 MG tablet Take 1 tablet (500 mg total) by mouth every 6 (six) hours as needed for muscle spasms. 06/12/22   Cassandria Anger, PA-C  Multiple Minerals (CALCIUM-MAGNESIUM-ZINC) TABS Take 1 tablet by mouth daily.    [provider]  oxyCODONE (OXY IR/ROXICODONE) 5 MG immediate release tablet Take 1-2 tablets (5-10 mg total) by mouth every 4 (four) hours as needed for severe pain (1 tab pain score 4-6, 2 tabs pain score 7-10). 06/12/22   Cassandria Anger, PA-C  polyethylene glycol (MIRALAX / GLYCOLAX) 17 g packet Take 17 g by mouth daily as needed for mild constipation. Patient not taking: Reported on 04/18/2022 05/18/21   Cassandria Anger, PA-C  potassium chloride (KLOR-CON) 10 MEQ tablet Take 10-20 mEq by mouth daily. 12/15/21   [provider]  ranolazine (RANEXA) 500 MG 12 hr tablet Take 500 mg by mouth 2 (two) times daily. 04/30/22   [provider]  tadalafil (CIALIS) 5 MG tablet Take 5 mg by mouth daily as needed for erectile dysfunction. 02/02/22   [provider]  valACYclovir (VALTREX) 1000 MG tablet Take 1,000 mg by mouth daily as needed (fever blister). 04/11/21   [provider]  zinc gluconate 50 MG tablet Take 25 mg by mouth 2 (two) times daily.     [provider]      Allergies    Tape, Other, Doxycycline, and Horse epithelium    Review of Systems   Review of Systems  All other systems reviewed and are negative.   Physical Exam Updated Vital Signs BP (!) 112/49   Pulse 74   Temp 98.4 F (36.9 C) (Oral)   Resp 16   Ht 5\' 8"  (1.727 m)   Wt 67.1 kg   SpO2 96%   BMI 22.50 kg/m  Physical Exam Vitals and nursing note reviewed.  Constitutional:      General: He is not in acute distress.    Appearance: He is well-developed.  HENT:     Head: Normocephalic and atraumatic.  Eyes:     Conjunctiva/sclera: Conjunctivae normal.  Cardiovascular:     Rate and Rhythm: Normal rate and regular rhythm.  Pulmonary:     Effort: Pulmonary effort is normal. No respiratory distress.     Breath sounds: Normal breath sounds.  Abdominal:  Palpations: Abdomen is soft.     Tenderness: There is no abdominal tenderness.  Musculoskeletal:        General: No swelling.     Cervical back: Neck supple.     Comments: Right hip wound VAC in place, bloody drainage noted, mild surrounding tenderness to palpation  Skin:    General: Skin is warm and dry.     Capillary Refill: Capillary refill takes less than 2 seconds.     Coloration: Skin is pale.  Neurological:     Mental Status: He is alert.  Psychiatric:        Mood and Affect: Mood normal.     ED Results / Procedures / Treatments   Labs (all labs ordered are listed, but only abnormal results are displayed) Labs Reviewed  CBC WITH DIFFERENTIAL/PLATELET - Abnormal; Notable for the following components:      Result Value   RBC 1.83 (*)    Hemoglobin 5.9 (*)    HCT 18.8 (*)    MCV 102.7 (*)    RDW 18.6 (*)    Lymphs Abs 0.4 (*)    All other components within normal limits  BASIC METABOLIC PANEL - Abnormal; Notable for the following components:   Glucose, Bld 110 (*)    BUN 31 (*)    Calcium 7.9 (*)    All other components within normal limits  PROTIME-INR - Abnormal;  Notable for the following components:   Prothrombin Time 20.2 (*)    INR 1.7 (*)    All other components within normal limits  RESP PANEL BY RT-PCR (RSV, FLU A&B, COVID)  RVPGX2  HEMOGLOBIN AND HEMATOCRIT, BLOOD  POC OCCULT BLOOD, ED  TYPE AND SCREEN  PREPARE RBC (CROSSMATCH)    EKG EKG Interpretation  Date/Time:  Tuesday June 25 2022 09:50:53 EST Ventricular Rate:  78 PR Interval:  193 QRS Duration: 110 QT Interval:  384 QTC Calculation: 438 R Axis:   -5 Text Interpretation: Sinus rhythm Confirmed by Regan Lemming ([id number]) on 06/25/2022 12:18:28 PM  Radiology DG Chest Portable 1 View  Result Date: 06/25/2022 CLINICAL DATA:  Pneumonia EXAM: PORTABLE CHEST 1 VIEW COMPARISON:  01/07/2022 FINDINGS: Transverse diameter of heart is increased. New patchy alveolar infiltrates are seen in right upper lung field and left parahilar region suggesting multifocal pneumonia. There are no signs of alveolar pulmonary edema. Costophrenic angles are clear. There is no pneumothorax. Tip of PICC line introduced through the right upper extremity is seen in superior vena cava. There is previous vertebroplasty in 1 of the thoracic vertebral bodies. IMPRESSION: New patchy alveolar infiltrates are seen in right upper lung field and left parahilar region suggesting multifocal pneumonia. Electronically Signed   By: Elmer Picker M.D.   On: 06/25/2022 09:58    Procedures .Critical Care  Performed by: Regan Lemming, MD Authorized by: Regan Lemming, MD   Critical care provider statement:    Critical care time (minutes):  30   Critical care was time spent personally by me on the following activities:  Development of treatment plan with patient or surrogate, discussions with consultants, evaluation of patient's response to treatment, examination of patient, ordering and review of laboratory studies, ordering and review of radiographic studies, ordering and performing treatments and interventions, pulse  oximetry, re-evaluation of patient's condition and review of old charts     Medications Ordered in ED Medications  oxyCODONE (Oxy IR/ROXICODONE) immediate release tablet 5-10 mg (10 mg Oral Given 06/25/22 1132)  0.9 %  sodium chloride infusion (  Manually program via Guardrails IV Fluids) ( Intravenous New Bag/Given 06/25/22 1519)  furosemide (LASIX) injection 20 mg (20 mg Intravenous Given 06/25/22 1447)    ED Course/ Medical Decision Making/ A&P Clinical Course as of 06/25/22 1607  Tue Jun 25, 2022  1057 Hemoglobin(!!): 5.9 [JL]  1126 Fecal Occult Blood, POC: NEGATIVE [JL]  1553 Stable 87YOM with a chief complaint of weakness from SNF.  Follows with [facility] and HGB of 5.9 Ortho consulted pending callback. Olin with ortho follows. Has a chronically bleeding transfusion. Actively bleeding from wound vac. Recently started on eliquis per ortho.  [CC]  1604 Dr. Charlann Boxer recommended medicine for obs [JL]    Clinical Course User Index [CC] Glyn Ade, MD [JL] Ernie Avena, MD                           Medical Decision Making Amount and/or Complexity of Data Reviewed Labs: ordered. Decision-making details documented in ED Course. Radiology: ordered.  Risk Prescription drug management. Decision regarding hospitalization.   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   Patient endorses fatigue, shortness of breath, generalized weakness.  His hemoglobin was found to be in the mid fives at his skilled nursing rehab facility earlier today and he was sent to the emergency department for blood transfusion.  He did speak with his orthopedist office, [facility] who are aware that he was being sent to the emergency department.  He endorses continued he output from his wound VAC.  He denies any melena or hematochezia.  He endorses a mild productive cough but is are already on treatment outpatient via PICC line with antibiotics.  Review, he is currently receiving daptomycin, Rocephin and oral azithromycin.  He remains on Eliquis for DVT prophylaxis.  On arrival, the patient was afebrile, hemodynamically stable, saturating 96% on 3 L O2 via nasal cannula, blood pressure 118/47, heart rate 78.  The patient's physical exam was significant for Ill-appearing man with tenderness surrounding his wound VAC which was showing evidence of persistent bloody drainage.  His fecal occult testing was negative, he was found to have no melena or hematochezia.  Initial CBC revealed a hemoglobin of 5.9.  I paged on-call [facility].  Initial EKG revealed sinus rhythm, ventricular rate 78, no acute ischemic changes.  A chest x-ray confirmed a multifocal pneumonia for which the patient is already undergoing IV antibiotic treatment.  PICC line was noted to be in place.  CBC was without a leukocytosis, INR was slightly elevated at 1.7, COVID-19, influenza, RSV PCR testing was collected and resulted negative, BMP significant for mild hypocalcemia to 7.9, otherwise unremarkable.   Was consented for blood transfusion and 2 units of PRBCs were ordered for transfusion given the patient's likely acute blood loss anemia from his chronic right hip seroma.  No evidence for GI bleed at this time.  Patient pending blood transfusion at time of signout.  Plan to reassess the patient following 2 units  of PRBCs,  recheck H&H posttransfusion.    1600 The patient continues to receive blood products.  I spoke with Dr. Charlann Boxer of on-call [facility] who requested medicine consultation for admission for observation.  The patient may benefit from a repeat visit to the operating room over the next few days.  Requested trending of the patient's hemoglobin, admission to [facility].  Hospitalist medicine consulted for admission.    Final Clinical Impression(s) / ED Diagnoses Final diagnoses:  Weakness  Acute blood loss anemia  Seroma of musculoskeletal structure after musculoskeletal system procedure    Rx / DC Orders ED Discharge Orders     None         Ernie Avena, MD 06/25/22 1459    Ernie Avena, MD 06/25/22 [phone]

## 2022-06-25 NOTE — ED Provider Notes (Signed)
Clinical Course as of 06/25/22 2259  Tue Jun 25, 2022  1057 Hemoglobin(!!): 5.9 [JL]  1126 Fecal Occult Blood, POC: NEGATIVE [JL]  1553 Stable 87YOM with a chief complaint of weakness from SNF.  Follows with [facility] and HGB of 5.9 Ortho consulted pending callback. Olin with ortho follows. Has a chronically bleeding transfusion. Actively bleeding from wound vac. Recently started on eliquis per ortho.  [CC]  [place] Dr. Alvan Dame recommended medicine for obs [JL]    Clinical Course User Index [CC] Tretha Sciara, MD [JL] Regan Lemming, MD   Prior provider and admission to medicine.  No further acute events at this time.   Tretha Sciara, MD 06/25/22 2259

## 2022-06-26 ENCOUNTER — Inpatient Hospital Stay (HOSPITAL_COMMUNITY): Payer: Medicare PPO

## 2022-06-26 DIAGNOSIS — I509 Heart failure, unspecified: Secondary | ICD-10-CM

## 2022-06-26 DIAGNOSIS — I480 Paroxysmal atrial fibrillation: Secondary | ICD-10-CM

## 2022-06-26 DIAGNOSIS — I10 Essential (primary) hypertension: Secondary | ICD-10-CM

## 2022-06-26 DIAGNOSIS — S7001XS Contusion of right hip, sequela: Secondary | ICD-10-CM

## 2022-06-26 DIAGNOSIS — D649 Anemia, unspecified: Secondary | ICD-10-CM | POA: Diagnosis not present

## 2022-06-26 DIAGNOSIS — J189 Pneumonia, unspecified organism: Secondary | ICD-10-CM

## 2022-06-26 DIAGNOSIS — R601 Generalized edema: Secondary | ICD-10-CM

## 2022-06-26 LAB — COMPREHENSIVE METABOLIC PANEL
ALT: 22 U/L (ref 0–44)
AST: 39 U/L (ref 15–41)
Albumin: 1.7 g/dL — ABNORMAL LOW (ref 3.5–5.0)
Alkaline Phosphatase: 68 U/L (ref 38–126)
Anion gap: 6 (ref 5–15)
BUN: 27 mg/dL — ABNORMAL HIGH (ref 8–23)
CO2: 24 mmol/L (ref 22–32)
Calcium: 7.9 mg/dL — ABNORMAL LOW (ref 8.9–10.3)
Chloride: 105 mmol/L (ref 98–111)
Creatinine, Ser: 0.85 mg/dL (ref 0.61–1.24)
GFR, Estimated: >60 mL/min (ref >60)
Glucose, Bld: 97 mg/dL (ref 70–99)
Potassium: 3.8 mmol/L (ref 3.5–5.1)
Sodium: 135 mmol/L (ref 135–145)
Total Bilirubin: 1.2 mg/dL (ref 0.3–1.2)
Total Protein: 5.5 g/dL — ABNORMAL LOW (ref 6.5–8.1)

## 2022-06-26 LAB — CBC
HCT: 28.3 % — ABNORMAL LOW (ref 39.0–52.0)
Hemoglobin: 9.2 g/dL — ABNORMAL LOW (ref 13.0–17.0)
MCH: 30.2 pg (ref 26.0–34.0)
MCHC: 32.5 g/dL (ref 30.0–36.0)
MCV: 92.8 fL (ref 80.0–100.0)
Platelets: 261 10*3/uL (ref 150–400)
RBC: 3.05 MIL/uL — ABNORMAL LOW (ref 4.22–5.81)
RDW: 22.3 % — ABNORMAL HIGH (ref 11.5–15.5)
WBC: 8.1 10*3/uL (ref 4.0–10.5)
nRBC: 0 % (ref 0.0–0.2)

## 2022-06-26 LAB — ECHOCARDIOGRAM COMPLETE
AV Mean grad: 4 mmHg
AV Peak grad: 7.8 mmHg
Ao pk vel: 1.4 m/s
Area-P 1/2: 4.94 cm2
Calc EF: 61 %
Height: 68 in
MV M vel: 6.19 m/s
MV Peak grad: 153.4 mmHg
Radius: 0.6 cm
Single Plane A2C EF: 55.3 %
Single Plane A4C EF: 65.1 %
Weight: 2368 oz

## 2022-06-26 LAB — TYPE AND SCREEN
ABO/RH(D): A POS
Antibody Screen: POSITIVE
DAT, IgG: POSITIVE
Donor AG Type: NEGATIVE
Donor AG Type: NEGATIVE
Unit division: 0
Unit division: 0

## 2022-06-26 LAB — BPAM RBC
Blood Product Expiration Date: 202401302359
Blood Product Expiration Date: 202401312359
ISSUE DATE / TIME: 202401091514
ISSUE DATE / TIME: 202401092258
Unit Type and Rh: 6200
Unit Type and Rh: 6200

## 2022-06-26 LAB — BRAIN NATRIURETIC PEPTIDE: B Natriuretic Peptide: 346.6 pg/mL — ABNORMAL HIGH (ref 0.0–100.0)

## 2022-06-26 MED ORDER — FUROSEMIDE 10 MG/ML IJ SOLN
20.0000 mg | Freq: Every day | INTRAMUSCULAR | Status: DC
  Administered 2022-06-26 – 2022-06-27 (×2): 20 mg via INTRAVENOUS
  Filled 2022-06-26: qty 2

## 2022-06-26 MED ORDER — SODIUM CHLORIDE 0.9 % IV SOLN
2.0000 g | Freq: Three times a day (TID) | INTRAVENOUS | Status: DC
  Administered 2022-06-26 – 2022-06-27 (×4): 2 g via INTRAVENOUS
  Filled 2022-06-26 (×5): qty 12.5

## 2022-06-26 MED ORDER — ACETAMINOPHEN 325 MG PO TABS
650.0000 mg | ORAL_TABLET | Freq: Four times a day (QID) | ORAL | Status: DC | PRN
  Administered 2022-06-27 (×3): 650 mg via ORAL
  Filled 2022-06-26 (×3): qty 2

## 2022-06-26 MED ORDER — ALBUTEROL SULFATE (2.5 MG/3ML) 0.083% IN NEBU: 2.5000 mg | INHALATION_SOLUTION | RESPIRATORY_TRACT | Status: DC | PRN

## 2022-06-26 MED ORDER — CHLORHEXIDINE GLUCONATE CLOTH 2 % EX PADS
6.0000 | MEDICATED_PAD | Freq: Every day | CUTANEOUS | Status: DC
  Administered 2022-06-26 – 2022-07-07 (×12): 6 via TOPICAL

## 2022-06-26 MED ORDER — GUAIFENESIN ER 600 MG PO TB12
600.0000 mg | ORAL_TABLET | Freq: Two times a day (BID) | ORAL | Status: DC
  Administered 2022-06-26 – 2022-06-27 (×3): 600 mg via ORAL
  Filled 2022-06-26 (×4): qty 1

## 2022-06-26 MED ORDER — ENSURE ENLIVE PO LIQD
237.0000 mL | Freq: Two times a day (BID) | ORAL | Status: DC
  Administered 2022-06-26 – 2022-06-27 (×3): 237 mL via ORAL

## 2022-06-26 MED ORDER — HYDROMORPHONE HCL 1 MG/ML IJ SOLN
0.5000 mg | INTRAMUSCULAR | Status: DC | PRN
  Administered 2022-06-26 – 2022-06-29 (×7): 0.5 mg via INTRAVENOUS
  Filled 2022-06-26: qty 0.5
  Filled 2022-06-26 (×6): qty 1
  Filled 2022-06-26: qty 0.5

## 2022-06-26 MED ORDER — ADULT MULTIVITAMIN W/MINERALS CH
1.0000 | ORAL_TABLET | Freq: Every day | ORAL | Status: DC
  Administered 2022-06-26 – 2022-07-08 (×11): 1 via ORAL
  Filled 2022-06-26 (×11): qty 1

## 2022-06-26 MED ORDER — ACETAMINOPHEN 650 MG RE SUPP: 650.0000 mg | Freq: Four times a day (QID) | RECTAL | Status: DC | PRN

## 2022-06-26 MED ORDER — ALBUMIN HUMAN 5 % IV SOLN
12.5000 g | Freq: Once | INTRAVENOUS | Status: AC
  Administered 2022-06-26: 12.5 g via INTRAVENOUS
  Filled 2022-06-26: qty 250

## 2022-06-26 NOTE — Progress Notes (Signed)
Initial Nutrition Assessment  DOCUMENTATION CODES:   Non-severe (moderate) malnutrition in context of chronic illness  INTERVENTION:  - Heart Healthy diet.  - Ensure Plus High Protein po BID, each supplement provides 350 kcal and 20 grams of protein. - Daily multivitamin to support micronutrient needs. - Monitor weight trends.   NUTRITION DIAGNOSIS:   Moderate Malnutrition related to chronic illness as evidenced by moderate muscle depletion, moderate fat depletion.  GOAL:   Patient will meet greater than or equal to 90% of their needs  MONITOR:   PO intake, Supplement acceptance, Weight trends  REASON FOR ASSESSMENT:   Consult Assessment of nutrition requirement/status  ASSESSMENT:   88 y.o. male with past medical history of atrial fibrillation, hyperlipidemia, hypertension, and recnet total hip arthroplasty with antibiotic spacer (06/06/2022) who presented to hospital with weakness swelling in his arms and legs over the last few days  Met with patient and wife at bedside this afternoon. Patient reports a UBW of 147-150# and denied recent changes in weight. Per EMR, no significant changes in weight over the past year. However, patient noted to be experiencing anasarca which could be affecting current weight status and masking weight loss. Will monitor weight trends closely.  Patient reports he typically eats around 2 meals a day. Often has a late breakfast aroudn 10:30 or 11:00 and then either a big snack or meal around 2:00 or 3:00. Him and his wife will occasionally have a dinner later on, depending on the size of the snack/meal in the afternoon. Wife reports patient drinks 1 Premier Protein a day, likes chocolate.   Appetite is usually very good but patient reports current appetite is poor secondary to pain. There has been no intake documented this admission to assess intake.  Patient agreeable to try Ensure during admission to support intake. Wife shares patient often likes  to [employer] protein. Discussed can add milk or add ice cream to Ensure as well. Discussed importance of trying to eat something at each meal and to consume Ensure to avoid weight loss during admission.    Medications reviewed and include: Vancomycin  Labs reviewed:  -   NUTRITION - FOCUSED PHYSICAL EXAM:  Flowsheet Row Most Recent Value  Orbital Region Moderate depletion  Upper Arm Region Moderate depletion  Thoracic and Lumbar Region Moderate depletion  Buccal Region Severe depletion  Temple Region Moderate depletion  Clavicle Bone Region Moderate depletion  Clavicle and Acromion Bone Region Moderate depletion  Scapular Bone Region Unable to assess  Dorsal Hand Moderate depletion  Patellar Region Moderate depletion  Anterior Thigh Region Moderate depletion  Posterior Calf Region Moderate depletion  Edema (RD Assessment) None  Hair Reviewed  Eyes Reviewed  Mouth Reviewed  Skin Reviewed  Nails Reviewed       Diet Order:   Diet Order             Diet Heart Room service appropriate? Yes; Fluid consistency: Thin  Diet effective now                   EDUCATION NEEDS:  Education needs have been addressed  Skin:  Skin Assessment: Reviewed RN Assessment  Last BM:  unknown  Height:  Ht Readings from Last 1 Encounters:  06/25/22 5\' 8"  (1.727 m)   Weight:  Wt Readings from Last 1 Encounters:  06/25/22 67.1 kg   Ideal Body Weight:  70 kg  BMI:  Body mass index is 22.5 kg/m.  Estimated Nutritional Needs:  Kcal:  1800-2000  kcals Protein:  85-100 grams Fluid:  >/= 1.8L    Travis Wolfe RD, LDN For contact information, refer to [facility].

## 2022-06-26 NOTE — Progress Notes (Signed)
PHARMACY NOTE:  ANTIMICROBIAL RENAL DOSAGE ADJUSTMENT  Current antimicrobial regimen includes a mismatch between antimicrobial dosage and estimated renal function.  As per policy approved by the Pharmacy & Therapeutics and Medical Executive Committees, the antimicrobial dosage will be adjusted accordingly.  Current antimicrobial dosage:  cefepime 2 g IV q12h  Indication: pneumonia  Renal Function:  Estimated Creatinine Clearance: 60.3 mL/min (by C-G formula based on SCr of 0.85 mg/dL).     Antimicrobial dosage has been changed to:  cefepime 2 g IV q8h    Thank you for allowing pharmacy to be a part of this patient's care.   Tawnya Crook, PharmD, BCPS Clinical Pharmacist 06/26/2022 11:03 AM

## 2022-06-26 NOTE — Progress Notes (Signed)
PROGRESS NOTE    Travis Wolfe  PYP:[id number] DOB: 02/08/1935 DOA: 06/25/2022 PCP: Derrill Center., MD    Brief Narrative:   Travis Wolfe is a 88 y.o. male with past medical history of atrial fibrillation, hyperlipidemia, hypertension presented to hospital with weakness swelling in his arms and legs over the last few days.  He had a wound VAC which was draining excessively.  At the facility, labs were drawn and had significant drop in his hemoglobin so he was sent to the hospital for further evaluation and treatment.   Assessment and plan  Symptomatic anemia from right hip bleeding/hematoma.   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  Hemoglobin on presentation was 5.9.  Eliquis on hold.  Received 2 units of packed RBC.  Hemoglobin has improved to 9.2.  Orthopedics Dr. Alvan Dame was notified. Will follow orthopedic recommendations.  To monitor CBC.      Multifocal pneumonia On vancomycin and cefepime.  Continue nebulizer, Mucinex and supplemental oxygen    Anasarca    BNP at 346.  Albumin very low at 1.9.  2D echocardiogram has been ordered.  Will need nutritional support.  Will consult dietitian.  Will give 1 dose of IV albumin with Lasix.  Paroxysmal atrial fibrillation Essential hypertension    Holding Eliquis due to symptomatic anemia.  Med rec pending.   Hyerlipidemia Med rec pending.     DVT prophylaxis: SCDs Start: 06/26/22 0256   Code Status:     Code Status: Full Code  Disposition: Uncertain at this time.  Status is: Inpatient  Remains inpatient appropriate because: IV antibiotics, blood transfusion, orthopedic surgery evaluation possible intervention.   Family Communication:  Spoke with the patient's wife at bedside.  Consultants:  Orthopedics  Procedures:  PRBC 2 units  Antimicrobials:  Vancomycin and cefepime  Anti-infectives (From admission, onward)    Start     Dose/Rate Route Frequency Ordered Stop   06/26/22  2000  vancomycin (VANCOCIN) IVPB 1000 mg/200 mL premix        1,000 mg 200 mL/hr over 60 Minutes Intravenous Every 24 hours 06/25/22 1746     06/25/22 1800  vancomycin (VANCOREADY) IVPB 1500 mg/300 mL        1,500 mg 150 mL/hr over 120 Minutes Intravenous  Once 06/25/22 1746 06/25/22 2316   06/25/22 1800  ceFEPIme (MAXIPIME) 2 g in sodium chloride 0.9 % 100 mL IVPB        2 g 200 mL/hr over 30 Minutes Intravenous Every 12 hours 06/25/22 1746        Subjective: Today, patient was seen and examined at bedside.  Complains of pain over the thigh.  Patient's wife at bedside.  Patient has not been eating well due to lack of appetite.  Denies any fever, chills, runny nose, vomiting.  Patient's wife states that he has not been eating well for several days now.  Objective: Vitals:   06/26/22 0215 06/26/22 0500 06/26/22 0604 06/26/22 0801  BP: 137/63 (!) 161/61  133/64  Pulse: 89 82  79  Resp: 15 20  19   Temp: 98.2 F (36.8 C)  98.2 F (36.8 C)   TempSrc: Oral  Oral   SpO2: 96% 100%  96%  Weight:      Height:        Intake/Output Summary (Last 24 hours) at 06/26/2022 1101 Last data filed at 06/26/2022 0840 Gross per 24 hour  Intake 1496.48 ml  Output --  Net 1496.48 ml  Filed Weights   06/25/22 0940  Weight: 67.1 kg    Physical Examination: Body mass index is 22.5 kg/m.  General:  Average built, not in obvious distress, elderly male, Communicative, HENT:   No scleral pallor or icterus noted. Oral mucosa is moist.  Chest:  Clear breath sounds.  Diminished breath sounds bilaterally. No crackles or wheezes.  CVS: S1 &S2 heard. No murmur.  Regular rate and rhythm. Abdomen: Soft, nontender, nondistended.  Bowel sounds are heard.  Mild abdominal wall edema. Extremities: No cyanosis, clubbing with lower extremity edema.  Peripheral pulses are palpable.  Right thigh hip area with dressing and wound VAC. Psych: Alert, awake and oriented, normal mood CNS:  No cranial nerve deficits.   Moves all extremities. Skin: Warm and dry.    Data Reviewed:   CBC: Recent Labs  Lab 06/25/22 0957 06/25/22 1831 06/26/22 0415  WBC 7.5  --  8.1  NEUTROABS 6.5  --   --   HGB 5.9* 8.1* 9.2*  HCT 18.8* 25.8* 28.3*  MCV 102.7*  --  92.8  PLT 249  --  517    Basic Metabolic Panel: Recent Labs  Lab 06/25/22 0957 06/26/22 0415  NA 135 135  K 4.3 3.8  CL 105 105  CO2 24 24  GLUCOSE 110* 97  BUN 31* 27*  CREATININE 1.06 0.85  CALCIUM 7.9* 7.9*    Liver Function Tests: Recent Labs  Lab 06/25/22 1837 06/26/22 0415  AST  --  39  ALT  --  22  ALKPHOS  --  100  BILITOT  --  1.2  PROT  --  5.5*  ALBUMIN 1.9* 1.7*     Radiology Studies: DG Pelvis Portable  Result Date: 06/25/2022 CLINICAL DATA:  Post right hip surgery involving incision of total hip arthroplasty with antibiotic spacer placement. EXAM: PORTABLE PELVIS 1-2 VIEWS COMPARISON:  06/09/2022 FINDINGS: Examination demonstrates evidence of the antibiotic spacer over the right hip region unchanged. Surgical drain adjacent the proximal right femur unchanged. There is a rounded object with 3 spring like metallic structures projected over the medial aspect of the proximal right thigh of uncertain clinical significance. There is diffuse decreased bone mineralization. Fusion hardware over the lumbosacral spine and pelvis is intact and unchanged. Remainder of the exam is unchanged. IMPRESSION: Stable postsurgical changes compatible recent right hip arthroplasty removal and placement of antibiotic spacer. Electronically Signed   By: Marin Olp M.D.   On: 06/25/2022 17:10   DG Chest Portable 1 View  Result Date: 06/25/2022 CLINICAL DATA:  Pneumonia EXAM: PORTABLE CHEST 1 VIEW COMPARISON:  01/07/2022 FINDINGS: Transverse diameter of heart is increased. New patchy alveolar infiltrates are seen in right upper lung field and left parahilar region suggesting multifocal pneumonia. There are no signs of alveolar pulmonary edema.  Costophrenic angles are clear. There is no pneumothorax. Tip of PICC line introduced through the right upper extremity is seen in superior vena cava. There is previous vertebroplasty in 1 of the thoracic vertebral bodies. IMPRESSION: New patchy alveolar infiltrates are seen in right upper lung field and left parahilar region suggesting multifocal pneumonia. Electronically Signed   By: Elmer Picker M.D.   On: 06/25/2022 09:58      LOS: 1 day    Flora Lipps, MD [facility] Available via Epic secure chat 7am-7pm After these hours, please refer to coverage provider listed on [email or web address] 06/26/2022, 11:01 AM

## 2022-06-26 NOTE — Plan of Care (Signed)
?  Problem: Education: ?Goal: Knowledge of the prescribed therapeutic regimen will improve ?Outcome: Progressing ?  ?Problem: Activity: ?Goal: Ability to avoid complications of mobility impairment will improve ?Outcome: Progressing ?  ?Problem: Pain Management: ?Goal: Pain level will decrease with appropriate interventions ?Outcome: Progressing ?  ?Problem: Skin Integrity: ?Goal: Will show signs of wound healing ?Outcome: Progressing ?  ?

## 2022-06-26 NOTE — Progress Notes (Signed)
Patient ID: Travis Wolfe, male   DOB: 11-07-1934, 88 y.o.   MRN: [id number]  Patient very well known to me Agree with and appreciate the admission Needs help with 3rd spacing of fluid, possible nutrition consult  I will do full note with my thoughts and potential need for surgical intervention later today  Pain control Regular diet for now

## 2022-06-26 NOTE — Progress Notes (Signed)
  Echocardiogram 2D Echocardiogram has been performed.  Travis Wolfe 06/26/2022, 4:44 PM

## 2022-06-26 NOTE — Hospital Course (Signed)
Travis Wolfe is a 88 y.o. male with past medical history of atrial fibrillation, hyperlipidemia, hypertension presented to hospital with weakness swelling in his arms and legs over the last few days.  He had a wound VAC which was draining excessively.  At this facility labs were drawn and had significant drop in his hemoglobin so he was sent to the hospital for further evaluation and treatment.  evaluation.   Assessment and plan  Symptomatic anemia from right hip bleeding/hematoma.  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 Eliquis on hold.  Received 2 units of packed RBC.  Orthopedics Dr. Alvan Dame was notified. Will follow orthopedic recommendations.      Multifocal pneumonia On vancomycin and cefepime.  Continue nebulizer, Mucinex and supplemental oxygen    Anasarca    BNP at 346.  Albumin very low at 1.9.  2D echocardiogram has been ordered.  Will need nutritional support.  Paroxysmal atrial fibrillation Essential hypertension    Holding Eliquis due to symptomatic anemia.  Med rec pending.   Hyerlipidemia Med rec pending.

## 2022-06-26 NOTE — ED Notes (Signed)
Attempted to give pt his morning dose of ceFEPIme. Pt refused stating he wanted to wait until his wife arrived to receive anymore medications.

## 2022-06-26 NOTE — ED Notes (Signed)
Pt agitated and stating he wants to leave the hospital. Pt educated on importance of staying. Pt A&Ox4 w/ periods of confusion. Pt continues to state he no longer wants to stay here. Pt called his wife who states she would be on her way to come see him shortly. Pt reports he would allow this RN to get lab work, but refuses to take anymore medication from "this terrible hospital."

## 2022-06-26 NOTE — Consult Note (Signed)
I have placed a request via Secure Chat to Dr. Marylyn Ishihara requesting photos of the wound areas of concern to be placed in the EMR.   :   [place], [place], [place]

## 2022-06-27 DIAGNOSIS — I10 Essential (primary) hypertension: Secondary | ICD-10-CM | POA: Diagnosis not present

## 2022-06-27 DIAGNOSIS — S7001XS Contusion of right hip, sequela: Secondary | ICD-10-CM | POA: Diagnosis not present

## 2022-06-27 DIAGNOSIS — R601 Generalized edema: Secondary | ICD-10-CM | POA: Diagnosis not present

## 2022-06-27 DIAGNOSIS — E440 Moderate protein-calorie malnutrition: Secondary | ICD-10-CM | POA: Insufficient documentation

## 2022-06-27 DIAGNOSIS — D649 Anemia, unspecified: Secondary | ICD-10-CM | POA: Diagnosis not present

## 2022-06-27 LAB — CBC
HCT: 26.8 % — ABNORMAL LOW (ref 39.0–52.0)
Hemoglobin: 8.6 g/dL — ABNORMAL LOW (ref 13.0–17.0)
MCH: 29.9 pg (ref 26.0–34.0)
MCHC: 32.1 g/dL (ref 30.0–36.0)
MCV: 93.1 fL (ref 80.0–100.0)
Platelets: 242 10*3/uL (ref 150–400)
RBC: 2.88 MIL/uL — ABNORMAL LOW (ref 4.22–5.81)
RDW: 21.7 % — ABNORMAL HIGH (ref 11.5–15.5)
WBC: 8.7 10*3/uL (ref 4.0–10.5)
nRBC: 0 % (ref 0.0–0.2)

## 2022-06-27 LAB — BASIC METABOLIC PANEL
Anion gap: 7 (ref 5–15)
BUN: 23 mg/dL (ref 8–23)
CO2: 26 mmol/L (ref 22–32)
Calcium: 8.1 mg/dL — ABNORMAL LOW (ref 8.9–10.3)
Chloride: 106 mmol/L (ref 98–111)
Creatinine, Ser: 0.84 mg/dL (ref 0.61–1.24)
GFR, Estimated: >60 mL/min (ref >60)
Glucose, Bld: 109 mg/dL — ABNORMAL HIGH (ref 70–99)
Potassium: 3.3 mmol/L — ABNORMAL LOW (ref 3.5–5.1)
Sodium: 139 mmol/L (ref 135–145)

## 2022-06-27 LAB — MAGNESIUM: Magnesium: 2 mg/dL (ref 1.7–2.4)

## 2022-06-27 MED ORDER — VANCOMYCIN HCL 500 MG/100ML IV SOLN
500.0000 mg | Freq: Once | INTRAVENOUS | Status: AC
  Administered 2022-06-27: 500 mg via INTRAVENOUS
  Filled 2022-06-27: qty 100

## 2022-06-27 MED ORDER — LOPERAMIDE HCL 2 MG PO CAPS: 4.0000 mg | ORAL_CAPSULE | Freq: Four times a day (QID) | ORAL | Status: DC | PRN

## 2022-06-27 MED ORDER — VITAMIN D 25 MCG (1000 UNIT) PO TABS
2000.0000 [IU] | ORAL_TABLET | Freq: Every day | ORAL | Status: DC
  Administered 2022-06-27 – 2022-07-08 (×10): 2000 [IU] via ORAL
  Filled 2022-06-27 (×12): qty 2

## 2022-06-27 MED ORDER — RANOLAZINE ER 500 MG PO TB12
500.0000 mg | ORAL_TABLET | Freq: Two times a day (BID) | ORAL | Status: DC
  Administered 2022-06-27 – 2022-06-28 (×3): 500 mg via ORAL
  Filled 2022-06-27 (×6): qty 1

## 2022-06-27 MED ORDER — POTASSIUM CHLORIDE CRYS ER 20 MEQ PO TBCR
40.0000 meq | EXTENDED_RELEASE_TABLET | Freq: Once | ORAL | Status: AC
  Administered 2022-06-27: 40 meq via ORAL
  Filled 2022-06-27: qty 2

## 2022-06-27 MED ORDER — DORZOLAMIDE HCL-TIMOLOL MAL 2-0.5 % OP SOLN
1.0000 [drp] | Freq: Two times a day (BID) | OPHTHALMIC | Status: DC
  Administered 2022-06-27 – 2022-07-10 (×26): 1 [drp] via OPHTHALMIC
  Filled 2022-06-27 (×2): qty 10

## 2022-06-27 MED ORDER — METHOCARBAMOL 500 MG PO TABS
500.0000 mg | ORAL_TABLET | Freq: Four times a day (QID) | ORAL | Status: DC | PRN
  Administered 2022-06-27: 500 mg via ORAL
  Filled 2022-06-27: qty 1

## 2022-06-27 MED ORDER — VANCOMYCIN HCL 1500 MG/300ML IV SOLN
1500.0000 mg | INTRAVENOUS | Status: DC
  Filled 2022-06-27: qty 300

## 2022-06-27 MED ORDER — VALACYCLOVIR HCL 500 MG PO TABS: 1000.0000 mg | ORAL_TABLET | Freq: Every day | ORAL | Status: DC | PRN

## 2022-06-27 MED ORDER — POTASSIUM CHLORIDE CRYS ER 20 MEQ PO TBCR: 40.0000 meq | EXTENDED_RELEASE_TABLET | Freq: Every day | ORAL | Status: DC

## 2022-06-27 MED ORDER — ZOLPIDEM TARTRATE 5 MG PO TABS: 5.0000 mg | ORAL_TABLET | Freq: Every evening | ORAL | Status: DC | PRN

## 2022-06-27 MED ORDER — MEDIHONEY WOUND/BURN DRESSING EX PSTE
1.0000 | PASTE | Freq: Every day | CUTANEOUS | Status: DC
  Administered 2022-06-29: 1 via TOPICAL
  Filled 2022-06-27 (×2): qty 44

## 2022-06-27 MED ORDER — MECLIZINE HCL 12.5 MG PO TABS: 12.5000 mg | ORAL_TABLET | Freq: Two times a day (BID) | ORAL | Status: DC | PRN

## 2022-06-27 MED ORDER — ZINC SULFATE 220 (50 ZN) MG PO CAPS
220.0000 mg | ORAL_CAPSULE | Freq: Every day | ORAL | Status: DC
  Administered 2022-06-27 – 2022-07-08 (×10): 220 mg via ORAL
  Filled 2022-06-27 (×10): qty 1

## 2022-06-27 MED ORDER — OXYCODONE HCL 5 MG PO TABS
10.0000 mg | ORAL_TABLET | ORAL | Status: DC | PRN
  Administered 2022-06-27 – 2022-06-29 (×3): 10 mg via ORAL
  Filled 2022-06-27 (×3): qty 2

## 2022-06-27 MED ORDER — SODIUM CHLORIDE 0.9 % IV SOLN
500.0000 mg | Freq: Every day | INTRAVENOUS | Status: DC
  Administered 2022-06-27: 500 mg via INTRAVENOUS
  Filled 2022-06-27: qty 10

## 2022-06-27 MED ORDER — BACLOFEN 10 MG PO TABS
10.0000 mg | ORAL_TABLET | Freq: Three times a day (TID) | ORAL | Status: DC
  Administered 2022-06-27 – 2022-06-28 (×4): 10 mg via ORAL
  Filled 2022-06-27 (×5): qty 1

## 2022-06-27 NOTE — Progress Notes (Addendum)
Pharmacy Antibiotic Note  Travis Wolfe is a 88 y.o. male admitted on 06/25/2022 with symptomatic anemia. Patient on IV antibiotics (daptomycin and ceftriaxone) PTA through 07/19/22 for right hip PJI, s/p recent excision of right THA w/ placement of antibiotic spacer. Concern for multifocal pneumonia, pharmacy has been consulted for vancomycin and cefepime dosing.  Today, 06/27/2022: D3 Vanc/Cefepime WBC, Temps WNL MRSA PCR negative SCr lower/stable compared to admission Discussed PCR with MD, OK to switch vanc back to dapto  Plan: Continue Cefepime 2 g IV q8h - will monitor peripherally hereafter Will resume PTA daptomycin dosing (500 mg q24 hr) at 8p tonight (preferred facility scheduling d/t compounding logistics) since vanc levels likely below desired d/t improved renal function, will give a 1x dose of vanc 500 mg IV to cover the afternoon  Height: 5\' 8"  (172.7 cm) Weight: 72.5 kg (159 lb 13.3 oz) IBW/kg (Calculated) : 68.4  Temp (24hrs), Avg:99 F (37.2 C), Min:98 F (36.7 C), Max:100 F (37.8 C)  Recent Labs  Lab 06/25/22 0957 06/26/22 0415 06/27/22 0552  WBC 7.5 8.1 8.7  CREATININE 1.06 0.85 0.84     Estimated Creatinine Clearance: 62.2 mL/min (by C-G formula based on SCr of 0.84 mg/dL).    Allergies  Allergen Reactions   Tape Dermatitis and Rash    ONLY use Paper tape   Other     Cat dander   Doxycycline Rash   Horse Epithelium Rash    Antimicrobials this admission: Cefepime 1/9 >> Vancomycin 1/9 >> 1/11 dapto >>  Daptomycin/ceftriaxone PTA  Microbiology results: 1/9 MRSA PCR: neg  Thank you for allowing pharmacy to be a part of this patient's care.  Polly Cobia, PharmD, BCPS Clinical Pharmacist 06/27/2022 10:46 AM

## 2022-06-27 NOTE — TOC Initial Note (Signed)
Transition of Care [facility]) - Initial/Assessment Note    Patient Details  Name: Travis Wolfe MRN: [id number] Date of Birth: 1935-04-20  [facility] CM/SW Contact:    Leeroy Cha, RN Phone Number: 06/27/2022, 9:51 AM  Clinical Narrative:                 Patient is from [facility] for rehab.  Has wound vac  to right hip area and is draining.  Will follow for reutrn to [facility].  Fl2 to be updated and sent to [facility].  Expected Discharge Plan: [place] Barriers to Discharge: Continued Medical Work up   Patient Goals and CMS Choice Patient states their goals for this hospitalization and ongoing recovery are:: go to back to [employer].gov Compare Post Acute Care list provided to:: Patient Choice offered to / list presented to : Patient      Expected Discharge Plan and Services   Discharge Planning Services: CM Consult   Living arrangements for the past 2 months: [place]                                      Prior Living Arrangements/Services Living arrangements for the past 2 months: [place] Lives with:: Facility Resident Patient language and need for interpreter reviewed:: Yes Do you feel safe going back to the place where you live?: Yes            Criminal Activity/Legal Involvement Pertinent to Current Situation/Hospitalization: No - Comment as needed  Activities of Daily Living Home Assistive Devices/Equipment: Built-in shower seat, Shower chair with back, Grab bars in shower, Walker (specify type) ADL Screening (condition at time of admission) Patient's cognitive ability adequate to safely complete daily activities?: Yes Is the patient deaf or have difficulty hearing?: No Does the patient have difficulty seeing, even when wearing glasses/contacts?: No Does the patient have difficulty concentrating, remembering, or making decisions?: No Patient able to express need for  assistance with ADLs?: Yes Does the patient have difficulty dressing or bathing?: Yes Independently performs ADLs?: No Does the patient have difficulty walking or climbing stairs?: Yes Weakness of Legs: Right Weakness of Arms/Hands: Both  Permission Sought/Granted                  Emotional Assessment Appearance:: Appears stated age Attitude/Demeanor/Rapport: Engaged Affect (typically observed): Accepting, Appropriate Orientation: : Oriented to Self, Oriented to Place, Oriented to  Time, Oriented to Situation Alcohol / Substance Use: Not Applicable Psych Involvement: No (comment)  Admission diagnosis:  Acute blood loss anemia [D62] Weakness [R53.1] Symptomatic anemia [D64.9] Seroma of musculoskeletal structure after musculoskeletal system procedure [I34.742] Patient Active Problem List   Diagnosis Date Noted   Malnutrition of moderate degree 06/27/2022   Symptomatic anemia 06/25/2022   Anasarca 06/25/2022   HTN (hypertension) 06/25/2022   HLD (hyperlipidemia) 06/25/2022   Multifocal pneumonia 06/25/2022   Hypocalcemia 06/25/2022   Postoperative seroma of subcutaneous tissue after non-dermatologic procedure 05/02/2022   Hematoma of hip, right, subsequent encounter 01/31/2022   Hematoma of hip, right, sequela 01/31/2022   History of revision of total replacement of right hip joint 05/17/2021   Arthrofibrosis of hip joint, right 11/26/2018   Pain    Fever 02/20/2018   Metallosis    Staphylococcus infection    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   S/P hip replacement 01/26/2018  Cellulitis of right leg 12/15/2017   PAF (paroxysmal atrial fibrillation) ([place]) 12/15/2017   Abdominal wall pain in right lower quadrant 12/02/2012   PCP:  Derrill Center., MD Pharmacy:   [place] [address] Phone: [phone] Fax: [place] [phone] -  [place] [place] [address] Phone: [phone] Fax: [phone] - [place], [place] - [address] AT [place] [address] [place] [address] Phone: [phone] Fax: [phone]     Social Determinants of Health (SDOH) Social History: [place]: No Food Insecurity (06/26/2022)  Housing: Low Risk  (06/26/2022)  Transportation Needs: No Transportation Needs (06/26/2022)  Utilities: Not At Risk (06/26/2022)  Depression (PHQ2-9): Low Risk  (04/11/2019)  Tobacco Use: Low Risk  (06/07/2022)   SDOH Interventions:     Readmission Risk Interventions   No data to display

## 2022-06-27 NOTE — Progress Notes (Addendum)
Subjective:    Patient is very well known to Dr. Alvan Dame and I.  He reports severe pain in his right hip with any movement of his arms or legs.  No acute events overnight.   Objective: Vital signs in last 24 hours: Temp:  [98.5 F (36.9 C)-100 F (37.8 C)] 98.5 F (36.9 C) (01/11 0430) Pulse Rate:  [85-96] 90 (01/11 0430) Resp:  [17-20] 20 (01/11 0430) BP: (131-153)/(58-88) 131/88 (01/11 0430) SpO2:  [91 %-98 %] 98 % (01/11 0430) Weight:  [72.5 kg] 72.5 kg (01/11 0500)  Intake/Output from previous day:  Intake/Output Summary (Last 24 hours) at 06/27/2022 1727 Last data filed at 06/27/2022 1602 Gross per 24 hour  Intake 730.07 ml  Output 1870 ml  Net -1139.93 ml     Intake/Output this shift: Total I/O In: 440.1 [P.O.:240; IV Piggyback:200.1] Out: 750 [Urine:750]  Labs: Recent Labs    06/25/22 0957 06/25/22 1831 06/26/22 0415 06/27/22 0552  HGB 5.9* 8.1* 9.2* 8.6*   Recent Labs    06/26/22 0415 06/27/22 0552  WBC 8.1 8.7  RBC 3.05* 2.88*  HCT 28.3* 26.8*  PLT 261 242   Recent Labs    06/26/22 0415 06/27/22 0552  NA 135 139  K 3.8 3.3*  CL 105 106  CO2 24 26  BUN 27* 23  CREATININE 0.85 0.84  GLUCOSE 97 109*  CALCIUM 7.9* 8.1*   Recent Labs    06/25/22 0957  INR 1.7*    Exam: General - Patient is Alert and Oriented Extremity - Neurologically intact Intact pulses distally Dorsiflexion/Plantar flexion intact Hemovac in place Motor Function - intact, moving foot and toes well on exam.   Past Medical History:  Diagnosis Date   A-fib ([place])    REPORTS WAS DUE TO DEHYDRATION 3 YEARS AGO BUT WAS GIVEN FLUIDS IN ED AND MEDS TO SLOW HR AND DROVE HIMSELF HOME  ; HAD ABLATION X2 WITH CARDIO DR. AKBARY AS PRECAUTION  ; has been on eliquis since but reports he has not taken it for 5 weeks b/c he was told to hold it for the multiple irrigations and debridements for the hip infection. reports his cardiologist is aware he has been holding it;    Arthritis     Atherosclerosis of both carotid arteries    BPH (benign prostatic hyperplasia)    Dysrhythmia    a-fib   Fall    fell on friday 01-16-18;   lost balAnce whIile tryng to get his sock on ;sustained skin tear to left wrist ; no drainage  , scabbed over , and has been covering with bandage    Heart murmur    SINCE HIS 20s   HLD (hyperlipidemia)    Hypertension    Non-rheumatic mitral regurgitation     Assessment/Plan:    Principal Problem:   Symptomatic anemia Active Problems:   PAF (paroxysmal atrial fibrillation) (HCC)   Hematoma of hip, right, sequela   Anasarca   HTN (hypertension)   HLD (hyperlipidemia)   Multifocal pneumonia   Hypocalcemia   Malnutrition of moderate degree  Estimated body mass index is 24.3 kg/m as calculated from the following:   Height as of this encounter: 5\' 8"  (1.727 m).   Weight as of this encounter: 72.5 kg.  Hgb improved to 8.6.   Continue pain management.  TDWB RLE  Late entry note - plan as of now is for revision of right hip antibiotic spacer on Saturday morning.   Will need to be  NPO after midnight Friday. Continue to hold anti-coagulants.  Griffith Citron, PA-C Orthopedic Surgery [phone] 06/27/2022, 5:27 PM

## 2022-06-27 NOTE — [place] FL2 (Signed)
[place] MEDICAID FL2 LEVEL OF CARE FORM     IDENTIFICATION  Patient Name: Travis Wolfe Birthdate: 17-Jan-1935 Sex: male Admission Date (Current Location): 06/25/2022  [facility] and [place] Number:  Herbalist and Address:  [facility],  [place] [address] [place]      Provider Number: [id number]  Attending Physician Name and Address:  Flora Lipps, MD  Relative Name and Phone Number:       Current Level of Care: Hospital Recommended Level of Care: [place] Prior Approval Number:    Date Approved/Denied:   PASRR Number: [id number]  Discharge Plan: SNF    Current Diagnoses: Patient Active Problem List   Diagnosis Date Noted   Malnutrition of moderate degree 06/27/2022   Symptomatic anemia 06/25/2022   Anasarca 06/25/2022   HTN (hypertension) 06/25/2022   HLD (hyperlipidemia) 06/25/2022   Multifocal pneumonia 06/25/2022   Hypocalcemia 06/25/2022   Postoperative seroma of subcutaneous tissue after non-dermatologic procedure 05/02/2022   Hematoma of hip, right, subsequent encounter 01/31/2022   Hematoma of hip, right, sequela 01/31/2022   History of revision of total replacement of right hip joint 05/17/2021   Arthrofibrosis of hip joint, right 11/26/2018   Pain    Fever 02/20/2018   Metallosis    Staphylococcus infection    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   S/P hip replacement 01/26/2018   Cellulitis of right leg 12/15/2017   PAF (paroxysmal atrial fibrillation) ([place]) 12/15/2017   Abdominal wall pain in right lower quadrant 12/02/2012    Orientation RESPIRATION BLADDER Height & Weight     Self, Time, Situation, Place  Normal Continent Weight: 72.5 kg Height:  5\' 8"  (172.7 cm)  BEHAVIORAL SYMPTOMS/MOOD NEUROLOGICAL BOWEL NUTRITION STATUS      Continent Diet (regular)  AMBULATORY STATUS COMMUNICATION OF NEEDS Skin   Extensive Assist Verbally Surgical wounds, Skin abrasions (wound  vac to rt hip area)                       Personal Care Assistance Level of Assistance  Bathing, Feeding, Dressing Bathing Assistance: Limited assistance Feeding assistance: Limited assistance Dressing Assistance: Limited assistance     Functional Limitations Info  Sight, Hearing, Speech Sight Info: Adequate Hearing Info: Adequate Speech Info: Adequate    SPECIAL CARE FACTORS FREQUENCY  PT (By licensed PT), OT (By licensed OT)     PT Frequency: 5 x weekly OT Frequency: 5 x weekly            Contractures Contractures Info: Not present    Additional Factors Info  Code Status Code Status Info: full Allergies Info: tape, doxycycline, horse skin           Current Medications (06/27/2022):  This is the current hospital active medication list Current Facility-Administered Medications  Medication Dose Route Frequency Provider Last Rate Last Admin   acetaminophen (TYLENOL) tablet 650 mg  650 mg Oral Q6H PRN Marylyn Ishihara, Tyrone A, DO   650 mg at 06/27/22 0908   Or   acetaminophen (TYLENOL) suppository 650 mg  650 mg Rectal Q6H PRN Marylyn Ishihara, Tyrone A, DO       albuterol (PROVENTIL) (2.5 MG/3ML) 0.083% nebulizer solution 2.5 mg  2.5 mg Nebulization Q2H PRN Marylyn Ishihara, Tyrone A, DO       baclofen (LIORESAL) tablet 10 mg  10 mg Oral TID Pokhrel, Laxman, MD   10 mg at 06/27/22 0914   ceFEPIme (MAXIPIME) 2 g in sodium  chloride 0.9 % 100 mL IVPB  2 g Intravenous Q8H Ellington, Abby K, RPH 200 mL/hr at 06/27/22 0930 2 g at 06/27/22 0930   Chlorhexidine Gluconate Cloth 2 % PADS 6 each  6 each Topical Daily Marylyn Ishihara, Tyrone A, DO   6 each at 06/26/22 2009   cholecalciferol (VITAMIN D3) 25 MCG (1000 UNIT) tablet 2,000 Units  2,000 Units Oral Daily Pokhrel, Laxman, MD   2,000 Units at 06/27/22 0914   dorzolamide-timolol (COSOPT) 2-0.5 % ophthalmic solution 1 drop  1 drop Both Eyes BID Pokhrel, Laxman, MD   1 drop at 06/27/22 0931   feeding supplement (ENSURE ENLIVE / ENSURE PLUS) liquid 237 mL  237 mL  Oral BID BM Kyle, Tyrone A, DO   237 mL at 06/27/22 0910   furosemide (LASIX) injection 20 mg  20 mg Intravenous Daily Kyle, Tyrone A, DO   20 mg at 06/27/22 0931   guaiFENesin (MUCINEX) 12 hr tablet 600 mg  600 mg Oral BID Marylyn Ishihara, Tyrone A, DO   600 mg at 06/27/22 8299   HYDROmorphone (DILAUDID) injection 0.5 mg  0.5 mg Intravenous Q4H PRN Pokhrel, Laxman, MD   0.5 mg at 06/26/22 1332   loperamide (IMODIUM) capsule 4 mg  4 mg Oral QID PRN Pokhrel, Laxman, MD       meclizine (ANTIVERT) tablet 12.5 mg  12.5 mg Oral BID PRN Pokhrel, Laxman, MD       methocarbamol (ROBAXIN) tablet 500 mg  500 mg Oral Q6H PRN Pokhrel, Laxman, MD       multivitamin with minerals tablet 1 tablet  1 tablet Oral Daily Kyle, Tyrone A, DO   1 tablet at 06/27/22 3716   oxyCODONE (Oxy IR/ROXICODONE) immediate release tablet 10 mg  10 mg Oral Q4H PRN Pokhrel, Laxman, MD       ranolazine (RANEXA) 12 hr tablet 500 mg  500 mg Oral BID Pokhrel, Laxman, MD   500 mg at 06/27/22 9678   valACYclovir (VALTREX) tablet 1,000 mg  1,000 mg Oral Daily PRN Pokhrel, Laxman, MD       vancomycin (VANCOCIN) IVPB 1000 mg/200 mL premix  1,000 mg Intravenous Q24H Ellington, Abby K, RPH 200 mL/hr at 06/26/22 2008 1,000 mg at 06/26/22 2008   zinc sulfate capsule 220 mg  220 mg Oral Daily Pokhrel, Laxman, MD   220 mg at 06/27/22 0914   zolpidem (AMBIEN) tablet 5 mg  5 mg Oral QHS PRN Pokhrel, Laxman, MD         Discharge Medications: Please see discharge summary for a list of discharge medications.  Relevant Imaging Results:  Relevant Lab Results:   Additional Information SSN: [id number]  Leeroy Cha, RN

## 2022-06-27 NOTE — Consult Note (Signed)
WOC Nurse Consult Note: Reason for Consult: leg wounds Wound type: trauma Pressure Injury POA: NA Measurement: right lateral malleolar distal: 2cm x 1cm x 0.1cm; proximal: 1.5cm x 0.7cm x 0.1cm   Wound GHW:EXHBZJ is clean; proximal with 25% black non viable tissue/75% pink Drainage (amount, consistency, odor) scant, non purulent  Periwound: intact, thin frail skin  Dressing procedure/placement/frequency: Silicone foam to the distal wound  Medihoney to the proximal wound cover with foam Elevate legs Utilize nursing skin care order set to manage skin tears.   Discussed POC with patient and bedside nurse.  Re consult if needed, will not follow at this time. Thanks  Ashunti Schofield [employer], RN,CWOCN, CNS, [place] [phone])

## 2022-06-27 NOTE — Progress Notes (Addendum)
PROGRESS NOTE    Travis Wolfe  XNA:[id number] DOB: 06-22-1934 DOA: 06/25/2022 PCP: Derrill Center., MD    Brief Narrative:   Travis Wolfe is a 88 y.o. male with past medical history of atrial fibrillation, hyperlipidemia, hypertension presented to the hospital with weakness swelling in his arms and legs over the last few days.  He had a wound VAC which was draining excessively.  At the facility, labs were drawn and had significant drop in his hemoglobin so he was sent to the hospital for further evaluation and treatment.   Assessment and plan  Symptomatic anemia from right hip bleeding/hematoma.   Likely related to eliquis use. 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  Hemoglobin on presentation was 5.9.  Eliquis on hold.  Received 2 units of packed RBC and hemoglobin at this time at 8.6.  Orthopedics Dr. Alvan Dame on board and will follow orthopedic recommendations for possible surgical intervention if needed.  Patient is on PICC line and is currently getting daptomycin and Rocephin as outpatient.  Fecal occult negative.  Hypokalemia.  Potassium of 3.3 today.  Will replenish.  Magnesium of 2.0. check BMP in am.  Multifocal pneumonia Chest x-ray suggestive of patchy alveolar infiltrates.  On vancomycin and cefepime.  Continue nebulizer, Mucinex.   Was on nasal cannula which has been weaned off to room air.  Temperature max of 100 F.  No leukocytosis.  Possibility of fluid in condition.  Will transition back to daptomycin and.Cefepime at this time.   Anasarca   BNP at 346.  Albumin very low at 1.9.  2D echocardiogram showing LV ejection fraction 55 to 60% with LVH.  Received 1 dose of IV albumin with Lasix on 06/26/2022.  Will continue to monitor closely.  Patient is positive balance for 6665 mL with a urine output of 1120 ml in the last 24 hours.Will  consider further albumin if necessary.  Moderate protein-calorie malnutrition.  As evidenced by moderate  muscle depletion, moderate fat depletion.Body mass index is 24.3 kg/m. Present on admission.  Nutrition team on board.  Continue supplements with Ensure plus, multivitamin.  Paroxysmal atrial fibrillation Essential hypertension    Holding Eliquis due to symptomatic anemia.  Patient is not on  nodal blockers.  On Lasix daily at home.     DVT prophylaxis: SCDs Start: 06/26/22 0256   Code Status:     Code Status: Full Code  Disposition: Uncertain at this time.  Likely to rehabilitation.  Status is: Inpatient  Remains inpatient appropriate because: IV antibiotics, status post blood transfusion, orthopedic surgery evaluation -possible intervention.   Family Communication:  Spoke with the patient's wife at bedside on 06/26/2022.  Consultants:  Orthopedics  Procedures:  PRBC 2 units  Antimicrobials:  Daptomycin and cefepime IV  Anti-infectives (From admission, onward)    Start     Dose/Rate Route Frequency Ordered Stop   06/26/22 2000  vancomycin (VANCOCIN) IVPB 1000 mg/200 mL premix        1,000 mg 200 mL/hr over 60 Minutes Intravenous Every 24 hours 06/25/22 1746     06/26/22 1600  ceFEPIme (MAXIPIME) 2 g in sodium chloride 0.9 % 100 mL IVPB        2 g 200 mL/hr over 30 Minutes Intravenous Every 8 hours 06/26/22 1102     06/25/22 1800  vancomycin (VANCOREADY) IVPB 1500 mg/300 mL        1,500 mg 150 mL/hr over 120 Minutes Intravenous  Once 06/25/22 1746 06/25/22 2316  06/25/22 1800  ceFEPIme (MAXIPIME) 2 g in sodium chloride 0.9 % 100 mL IVPB  Status:  Discontinued        2 g 200 mL/hr over 30 Minutes Intravenous Every 12 hours 06/25/22 1746 06/26/22 1102      Subjective: Today, patient was seen and examined at bedside.  Patient still complains of severe pain over the right thigh area.  Denies any fever, nausea, vomiting, chills or rigor.  Has overall decreased appetite.   Objective: Vitals:   06/26/22 2345 06/27/22 0423 06/27/22 0430 06/27/22 0500  BP: (!) 142/58  (!) 142/62 131/88   Pulse: 87 96 90   Resp: 20 20 20    Temp: 100 F (37.8 C) 99.6 F (37.6 C) 98.5 F (36.9 C)   TempSrc: Oral Oral Oral   SpO2: 91% 93% 98%   Weight:    72.5 kg  Height:        Intake/Output Summary (Last 24 hours) at 06/27/2022 0753 Last data filed at 06/27/2022 0600 Gross per 24 hour  Intake 388.65 ml  Output 1120 ml  Net -731.35 ml    Filed Weights   06/25/22 0940 06/27/22 0500  Weight: 67.1 kg 72.5 kg    Physical Examination: Body mass index is 24.3 kg/m.  General: Alert awake and Communicative.  In mild distress due to pain.  Elderly male, appears deconditioned and weak. HENT:   Mild pallor noted, oral mucosa is moist.  Chest:  Clear breath sounds.  Diminished breath sounds bilaterally. No crackles or wheezes.  CVS: S1 &S2 heard. No murmur.  Regular rate and rhythm. Abdomen: Soft, nontender, nondistended.  Bowel sounds are heard.  Abdominal wall edema Extremities: No cyanosis, clubbing, lower extremity edema.  Peripheral pulses are palpable.  Right hip with dressing wound VAC. Psych: Alert, awake and oriented, mildly anxious CNS:  No cranial nerve deficits.  Generalized weakness noted Skin: Warm and dry.  Right thigh with wound VAC.  Data Reviewed:   CBC: Recent Labs  Lab 06/25/22 0957 06/25/22 1831 06/26/22 0415 06/27/22 0552  WBC 7.5  --  8.1 8.7  NEUTROABS 6.5  --   --   --   HGB 5.9* 8.1* 9.2* 8.6*  HCT 18.8* 25.8* 28.3* 26.8*  MCV 102.7*  --  92.8 93.1  PLT 249  --  261 242     Basic Metabolic Panel: Recent Labs  Lab 06/25/22 0957 06/26/22 0415 06/27/22 0552  NA 135 135 139  K 4.3 3.8 3.3*  CL 105 105 106  CO2 24 24 26   GLUCOSE 110* 97 109*  BUN 31* 27* 23  CREATININE 1.06 0.85 0.84  CALCIUM 7.9* 7.9* 8.1*  MG  --   --  2.0     Liver Function Tests: Recent Labs  Lab 06/25/22 1837 06/26/22 0415  AST  --  39  ALT  --  22  ALKPHOS  --  68  BILITOT  --  1.2  PROT  --  5.5*  ALBUMIN 1.9* 1.7*      Radiology  Studies: ECHOCARDIOGRAM COMPLETE  Result Date: 06/26/2022    ECHOCARDIOGRAM REPORT   Patient Name:   Travis Wolfe Date of Exam: 06/26/2022 Medical Rec #:  Travis Wolfe        Height:       68.0 in Accession #:    08/25/[id number]       Weight:       148.0 lb Date of Birth:  1934/11/05       BSA:  1.798 m Patient Age:    87 years         BP:           144/68 mmHg Patient Gender: M                HR:           100 bpm. Exam Location:  Inpatient Procedure: 2D Echo Indications:    CHF  History:        Patient has no prior history of Echocardiogram examinations.  Sonographer:    Cathie Hoops Referring Phys: [id number] Teddy Spike  Sonographer Comments: Technically difficult study due to poor echo windows. IMPRESSIONS  1. Left ventricular ejection fraction, by estimation, is 55 to 60%. The left ventricle has normal function. The left ventricle has no regional wall motion abnormalities. There is mild left ventricular hypertrophy. Left ventricular diastolic parameters were grossly normal.  2. Right ventricular systolic function is normal. The right ventricular size is normal. There is severely elevated pulmonary artery systolic pressure. The estimated right ventricular systolic pressure is 71.9 mmHg.  3. Mild bileaflet mitral valve prolapse. The mitral valve is myxomatous. Mild to moderate mitral valve regurgitation. No evidence of mitral stenosis.  4. Tricuspid valve regurgitation is mild to moderate.  5. The aortic valve is grossly normal. There is mild calcification of the aortic valve. There is mild thickening of the aortic valve. Aortic valve regurgitation is not visualized. No aortic stenosis is present.  6. The inferior vena cava is normal in size with greater than 50% respiratory variability, suggesting right atrial pressure of 3 mmHg.  7. Left atrial size was mildly dilated. FINDINGS  Left Ventricle: Left ventricular ejection fraction, by estimation, is 55 to 60%. The left ventricle has normal function. The  left ventricle has no regional wall motion abnormalities. The left ventricular internal cavity size was normal in size. There is  mild left ventricular hypertrophy. Left ventricular diastolic parameters were normal. Right Ventricle: The right ventricular size is normal. No increase in right ventricular wall thickness. Right ventricular systolic function is normal. There is severely elevated pulmonary artery systolic pressure. The tricuspid regurgitant velocity is 4.15 m/s, and with an assumed right atrial pressure of 3 mmHg, the estimated right ventricular systolic pressure is 71.9 mmHg. Left Atrium: Left atrial size was mildly dilated. Right Atrium: Right atrial size was normal in size. Pericardium: There is no evidence of pericardial effusion. 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 Mild mitral annular calcification. Mild to moderate mitral valve regurgitation. No evidence of mitral valve stenosis. Tricuspid Valve: The tricuspid valve is normal in structure. Tricuspid valve regurgitation is mild to moderate. No evidence of tricuspid stenosis. Aortic Valve: The aortic valve is grossly normal. There is mild calcification of the aortic valve. There is mild thickening of the aortic valve. Aortic valve regurgitation is not visualized. No aortic stenosis is present. Aortic valve mean gradient measures 4.0 mmHg. Aortic valve peak gradient measures 7.8 mmHg. Pulmonic Valve: The pulmonic valve was normal in structure. Pulmonic valve regurgitation is trivial. No evidence of pulmonic stenosis. Aorta: The aortic root is normal in size and structure and the ascending aorta was not well visualized. Ascending aorta measurements are within normal limits for age when indexed to body surface area. Venous: The inferior vena cava is normal in size with greater than 50% respiratory variability, suggesting right atrial pressure of 3 mmHg. IAS/Shunts: No atrial level shunt detected by color  flow Doppler.  LV Volumes (MOD) LV vol d, MOD A2C: 73.6 ml  Diastology LV vol d, MOD A4C: 137.0 ml LV e' medial:    11.80 cm/s LV vol s, MOD A2C: 32.9 ml  LV E/e' medial:  9.5 LV vol s, MOD A4C: 47.8 ml  LV e' lateral:   15.30 cm/s LV SV MOD A2C:     40.7 ml  LV E/e' lateral: 7.3 LV SV MOD A4C:     137.0 ml LV SV MOD BP:      67.5 ml RIGHT VENTRICLE RV Basal diam:  3.00 cm RV Mid diam:    2.40 cm RV S prime:     21.20 cm/s TAPSE (M-mode): 1.6 cm LEFT ATRIUM           Index        RIGHT ATRIUM          Index LA Vol (A4C): 84.7 ml 47.10 ml/m  RA Area:     8.75 cm                                    RA Volume:   12.90 ml 7.17 ml/m  AORTIC VALVE                   PULMONIC VALVE AV Vmax:           140.00 cm/s PV Vmax:          1.28 m/s AV Vmean:          93.700 cm/s PV Peak grad:     6.6 mmHg AV VTI:            0.213 m     PR End Diast Vel: 10.24 msec AV Peak Grad:      7.8 mmHg AV Mean Grad:      4.0 mmHg LVOT Vmax:         110.00 cm/s LVOT Vmean:        73.000 cm/s LVOT VTI:          0.169 m LVOT/AV VTI ratio: 0.79  AORTA Ao Root diam: 3.60 cm MITRAL VALVE                  TRICUSPID VALVE MV Area (PHT): 4.94 cm       TR Peak grad:   68.9 mmHg MV Decel Time: 154 msec       TR Vmax:        415.00 cm/s MR Peak grad:    153.4 mmHg MR Mean grad:    104.0 mmHg   SHUNTS MR Vmax:         619.25 cm/s  Systemic VTI: 0.17 m MR Vmean:        485.0 cm/s MR PISA:         2.26 cm MR PISA Eff ROA: 10 mm MR PISA Radius:  0.60 cm MV E velocity: 112.00 cm/s MV A velocity: 71.65 cm/s MV E/A ratio:  1.56 Cherlynn Kaiser MD Electronically signed by Cherlynn Kaiser MD Signature Date/Time: 06/26/2022/11:21:30 PM    Final    DG Pelvis Portable  Result Date: 06/25/2022 CLINICAL DATA:  Post right hip surgery involving incision of total hip arthroplasty with antibiotic spacer placement. EXAM: PORTABLE PELVIS 1-2 VIEWS COMPARISON:  06/09/2022 FINDINGS: Examination demonstrates evidence of the antibiotic spacer over the right hip region  unchanged. Surgical drain adjacent the proximal right femur unchanged. There is a  rounded object with 3 spring like metallic structures projected over the medial aspect of the proximal right thigh of uncertain clinical significance. There is diffuse decreased bone mineralization. Fusion hardware over the lumbosacral spine and pelvis is intact and unchanged. Remainder of the exam is unchanged. IMPRESSION: Stable postsurgical changes compatible recent right hip arthroplasty removal and placement of antibiotic spacer. Electronically Signed   By: Elberta Fortis M.D.   On: 06/25/2022 17:10   DG Chest Portable 1 View  Result Date: 06/25/2022 CLINICAL DATA:  Pneumonia EXAM: PORTABLE CHEST 1 VIEW COMPARISON:  01/07/2022 FINDINGS: Transverse diameter of heart is increased. New patchy alveolar infiltrates are seen in right upper lung field and left parahilar region suggesting multifocal pneumonia. There are no signs of alveolar pulmonary edema. Costophrenic angles are clear. There is no pneumothorax. Tip of PICC line introduced through the right upper extremity is seen in superior vena cava. There is previous vertebroplasty in 1 of the thoracic vertebral bodies. IMPRESSION: New patchy alveolar infiltrates are seen in right upper lung field and left parahilar region suggesting multifocal pneumonia. Electronically Signed   By: Ernie Avena M.D.   On: 06/25/2022 09:58      LOS: 2 days    Joycelyn Das, MD [facility] Available via Epic secure chat 7am-7pm After these hours, please refer to coverage provider listed on [email or web address] 06/27/2022, 7:53 AM

## 2022-06-28 ENCOUNTER — Inpatient Hospital Stay (HOSPITAL_COMMUNITY): Payer: Medicare PPO

## 2022-06-28 ENCOUNTER — Encounter (HOSPITAL_COMMUNITY): Payer: Self-pay | Admitting: Internal Medicine

## 2022-06-28 DIAGNOSIS — D649 Anemia, unspecified: Secondary | ICD-10-CM | POA: Diagnosis not present

## 2022-06-28 DIAGNOSIS — I10 Essential (primary) hypertension: Secondary | ICD-10-CM | POA: Diagnosis not present

## 2022-06-28 DIAGNOSIS — G928 Other toxic encephalopathy: Secondary | ICD-10-CM | POA: Diagnosis not present

## 2022-06-28 DIAGNOSIS — R601 Generalized edema: Secondary | ICD-10-CM | POA: Diagnosis not present

## 2022-06-28 DIAGNOSIS — S7001XS Contusion of right hip, sequela: Secondary | ICD-10-CM | POA: Diagnosis not present

## 2022-06-28 LAB — BLOOD GAS, ARTERIAL
Acid-Base Excess: 1.9 mmol/L (ref 0.0–2.0)
Acid-Base Excess: 2.4 mmol/L — ABNORMAL HIGH (ref 0.0–2.0)
Bicarbonate: 25.6 mmol/L (ref 20.0–28.0)
Bicarbonate: 26.4 mmol/L (ref 20.0–28.0)
Drawn by: 25788
O2 Content: 15 L/min
O2 Saturation: 95.2 %
O2 Saturation: 95.2 %
Patient temperature: 36.6
Patient temperature: 37.1
pCO2 arterial: 35 mmHg (ref 32–48)
pCO2 arterial: 38 mmHg (ref 32–48)
pH, Arterial: 7.47 — ABNORMAL HIGH (ref 7.35–7.45)
pH, Arterial: 7.45 (ref 7.35–7.45)
pO2, Arterial: 62 mmHg — ABNORMAL LOW (ref 83–108)
pO2, Arterial: 83 mmHg (ref 83–108)

## 2022-06-28 LAB — CBC WITH DIFFERENTIAL/PLATELET
Abs Immature Granulocytes: 0.03 10*3/uL (ref 0.00–0.07)
Basophils Absolute: 0 10*3/uL (ref 0.0–0.1)
Basophils Relative: 0 %
Eosinophils Absolute: 0 10*3/uL (ref 0.0–0.5)
Eosinophils Relative: 0 %
HCT: 35.6 % — ABNORMAL LOW (ref 39.0–52.0)
Hemoglobin: 11.3 g/dL — ABNORMAL LOW (ref 13.0–17.0)
Immature Granulocytes: 1 %
Lymphocytes Relative: 6 %
Lymphs Abs: 0.4 10*3/uL — ABNORMAL LOW (ref 0.7–4.0)
MCH: 30 pg (ref 26.0–34.0)
MCHC: 31.7 g/dL (ref 30.0–36.0)
MCV: 94.4 fL (ref 80.0–100.0)
Monocytes Absolute: 0.2 10*3/uL (ref 0.1–1.0)
Monocytes Relative: 4 %
Neutro Abs: 5.5 10*3/uL (ref 1.7–7.7)
Neutrophils Relative %: 89 %
Platelets: 260 10*3/uL (ref 150–400)
RBC: 3.77 MIL/uL — ABNORMAL LOW (ref 4.22–5.81)
RDW: 22 % — ABNORMAL HIGH (ref 11.5–15.5)
WBC: 6.1 10*3/uL (ref 4.0–10.5)
nRBC: 0 % (ref 0.0–0.2)

## 2022-06-28 LAB — URINALYSIS, ROUTINE W REFLEX MICROSCOPIC
Bilirubin Urine: NEGATIVE
Glucose, UA: NEGATIVE mg/dL
Ketones, ur: NEGATIVE mg/dL
Nitrite: NEGATIVE
Protein, ur: NEGATIVE mg/dL
Specific Gravity, Urine: 1.031 — ABNORMAL HIGH (ref 1.005–1.030)
pH: 5 (ref 5.0–8.0)

## 2022-06-28 LAB — BASIC METABOLIC PANEL
Anion gap: 10 (ref 5–15)
Anion gap: 9 (ref 5–15)
BUN: 27 mg/dL — ABNORMAL HIGH (ref 8–23)
BUN: 36 mg/dL — ABNORMAL HIGH (ref 8–23)
CO2: 21 mmol/L — ABNORMAL LOW (ref 22–32)
CO2: 24 mmol/L (ref 22–32)
Calcium: 8.1 mg/dL — ABNORMAL LOW (ref 8.9–10.3)
Calcium: 8.1 mg/dL — ABNORMAL LOW (ref 8.9–10.3)
Chloride: 106 mmol/L (ref 98–111)
Chloride: 104 mmol/L (ref 98–111)
Creatinine, Ser: 1.03 mg/dL (ref 0.61–1.24)
Creatinine, Ser: 0.88 mg/dL (ref 0.61–1.24)
GFR, Estimated: >60 mL/min (ref >60)
GFR, Estimated: >60 mL/min (ref >60)
Glucose, Bld: 137 mg/dL — ABNORMAL HIGH (ref 70–99)
Glucose, Bld: 136 mg/dL — ABNORMAL HIGH (ref 70–99)
Potassium: 3.2 mmol/L — ABNORMAL LOW (ref 3.5–5.1)
Potassium: 4.4 mmol/L (ref 3.5–5.1)
Sodium: 137 mmol/L (ref 135–145)
Sodium: 137 mmol/L (ref 135–145)

## 2022-06-28 LAB — TROPONIN I (HIGH SENSITIVITY): Troponin I (High Sensitivity): 452 ng/L (ref <18)

## 2022-06-28 LAB — PHOSPHORUS: Phosphorus: 3.8 mg/dL (ref 2.5–4.6)

## 2022-06-28 LAB — LACTATE DEHYDROGENASE: LDH: 348 U/L — ABNORMAL HIGH (ref 98–192)

## 2022-06-28 LAB — CBC
HCT: 29.7 % — ABNORMAL LOW (ref 39.0–52.0)
Hemoglobin: 9.6 g/dL — ABNORMAL LOW (ref 13.0–17.0)
MCH: 30.7 pg (ref 26.0–34.0)
MCHC: 32.3 g/dL (ref 30.0–36.0)
MCV: 94.9 fL (ref 80.0–100.0)
Platelets: 224 10*3/uL (ref 150–400)
RBC: 3.13 MIL/uL — ABNORMAL LOW (ref 4.22–5.81)
RDW: 21.3 % — ABNORMAL HIGH (ref 11.5–15.5)
WBC: 6.4 10*3/uL (ref 4.0–10.5)
nRBC: 0.3 % — ABNORMAL HIGH (ref 0.0–0.2)

## 2022-06-28 LAB — SEDIMENTATION RATE: Sed Rate: 100 mm/hr — ABNORMAL HIGH (ref 0–16)

## 2022-06-28 LAB — BRAIN NATRIURETIC PEPTIDE: B Natriuretic Peptide: 809.2 pg/mL — ABNORMAL HIGH (ref 0.0–100.0)

## 2022-06-28 LAB — LACTIC ACID, PLASMA
Lactic Acid, Venous: 2.4 mmol/L (ref 0.5–1.9)
Lactic Acid, Venous: 2.4 mmol/L (ref 0.5–1.9)

## 2022-06-28 LAB — GLUCOSE, CAPILLARY
Glucose-Capillary: 114 mg/dL — ABNORMAL HIGH (ref 70–99)
Glucose-Capillary: 140 mg/dL — ABNORMAL HIGH (ref 70–99)
Glucose-Capillary: 157 mg/dL — ABNORMAL HIGH (ref 70–99)
Glucose-Capillary: 163 mg/dL — ABNORMAL HIGH (ref 70–99)

## 2022-06-28 LAB — D-DIMER, QUANTITATIVE: D-Dimer, Quant: 6.91 ug/mL-FEU — ABNORMAL HIGH (ref 0.00–0.50)

## 2022-06-28 LAB — PROCALCITONIN: Procalcitonin: 7.46 ng/mL

## 2022-06-28 LAB — C-REACTIVE PROTEIN: CRP: 25.8 mg/dL — ABNORMAL HIGH (ref <1.0)

## 2022-06-28 LAB — MAGNESIUM
Magnesium: 1.9 mg/dL (ref 1.7–2.4)
Magnesium: 3 mg/dL — ABNORMAL HIGH (ref 1.7–2.4)

## 2022-06-28 MED ORDER — MAGNESIUM SULFATE 2 GM/50ML IV SOLN
2.0000 g | Freq: Once | INTRAVENOUS | Status: AC
  Administered 2022-06-28: 2 g via INTRAVENOUS
  Filled 2022-06-28: qty 50

## 2022-06-28 MED ORDER — SODIUM CHLORIDE 0.9 % IV SOLN
2.0000 g | Freq: Two times a day (BID) | INTRAVENOUS | Status: DC
  Filled 2022-06-28: qty 12.5

## 2022-06-28 MED ORDER — IPRATROPIUM-ALBUTEROL 0.5-2.5 (3) MG/3ML IN SOLN: 3.0000 mL | RESPIRATORY_TRACT | Status: DC | PRN

## 2022-06-28 MED ORDER — VITAL HIGH PROTEIN PO LIQD: 1000.0000 mL | ORAL | Status: DC

## 2022-06-28 MED ORDER — FUROSEMIDE 10 MG/ML IJ SOLN
40.0000 mg | Freq: Once | INTRAMUSCULAR | Status: AC
  Administered 2022-06-28: 40 mg via INTRAVENOUS
  Filled 2022-06-28: qty 4

## 2022-06-28 MED ORDER — VANCOMYCIN HCL 1250 MG/250ML IV SOLN
1250.0000 mg | INTRAVENOUS | Status: DC
  Administered 2022-06-28: 1250 mg via INTRAVENOUS
  Filled 2022-06-28: qty 250

## 2022-06-28 MED ORDER — POTASSIUM CHLORIDE 10 MEQ/100ML IV SOLN: 10.0000 meq | INTRAVENOUS | Status: DC

## 2022-06-28 MED ORDER — SODIUM CHLORIDE (PF) 0.9 % IJ SOLN
INTRAMUSCULAR | Status: AC
  Filled 2022-06-28: qty 50

## 2022-06-28 MED ORDER — VITAL 1.5 CAL PO LIQD
1000.0000 mL | ORAL | Status: DC
  Administered 2022-06-28 – 2022-07-01 (×3): 1000 mL
  Filled 2022-06-28 (×2): qty 1000

## 2022-06-28 MED ORDER — HYDROMORPHONE HCL 1 MG/ML IJ SOLN
0.5000 mg | Freq: Once | INTRAMUSCULAR | Status: AC
  Administered 2022-06-28: 0.5 mg via INTRAVENOUS
  Filled 2022-06-28: qty 1

## 2022-06-28 MED ORDER — METHYLPREDNISOLONE SODIUM SUCC 40 MG IJ SOLR
40.0000 mg | Freq: Two times a day (BID) | INTRAMUSCULAR | Status: DC
  Administered 2022-06-28 – 2022-06-30 (×5): 40 mg via INTRAVENOUS
  Filled 2022-06-28 (×5): qty 1

## 2022-06-28 MED ORDER — POTASSIUM CHLORIDE 10 MEQ/100ML IV SOLN
10.0000 meq | INTRAVENOUS | Status: AC
  Administered 2022-06-28 (×3): 10 meq via INTRAVENOUS
  Filled 2022-06-28 (×3): qty 100

## 2022-06-28 MED ORDER — IOHEXOL 350 MG/ML SOLN
100.0000 mL | Freq: Once | INTRAVENOUS | Status: AC | PRN
  Administered 2022-06-28: 100 mL via INTRAVENOUS

## 2022-06-28 MED ORDER — POTASSIUM CHLORIDE 10 MEQ/50ML IV SOLN
10.0000 meq | INTRAVENOUS | Status: AC
  Administered 2022-06-28 (×3): 10 meq via INTRAVENOUS
  Filled 2022-06-28 (×3): qty 50

## 2022-06-28 MED ORDER — PIPERACILLIN-TAZOBACTAM 3.375 G IVPB
3.3750 g | Freq: Three times a day (TID) | INTRAVENOUS | Status: DC
  Administered 2022-06-28 – 2022-06-29 (×4): 3.375 g via INTRAVENOUS
  Filled 2022-06-28 (×4): qty 50

## 2022-06-28 MED ORDER — SODIUM CHLORIDE 0.9 % IV SOLN
500.0000 mg | Freq: Every day | INTRAVENOUS | Status: AC
  Administered 2022-06-28 – 2022-07-02 (×5): 500 mg via INTRAVENOUS
  Filled 2022-06-28 (×5): qty 5

## 2022-06-28 MED ORDER — SODIUM CHLORIDE 0.9% FLUSH: 10.0000 mL | INTRAVENOUS | Status: DC | PRN

## 2022-06-28 MED ORDER — SODIUM CHLORIDE (PF) 0.9 % IJ SOLN
INTRAMUSCULAR | Status: AC
  Filled 2022-06-28: qty 150

## 2022-06-28 MED ORDER — SODIUM CHLORIDE 0.9% FLUSH
10.0000 mL | Freq: Two times a day (BID) | INTRAVENOUS | Status: DC
  Administered 2022-06-28: 10 mL
  Administered 2022-06-28: 20 mL
  Administered 2022-06-29: 30 mL
  Administered 2022-06-30 (×2): 10 mL
  Administered 2022-07-01: 40 mL
  Administered 2022-07-02 – 2022-07-03 (×3): 10 mL
  Administered 2022-07-04: 30 mL
  Administered 2022-07-04 – 2022-07-05 (×3): 10 mL

## 2022-06-28 MED ORDER — HEPARIN SODIUM (PORCINE) 5000 UNIT/ML IJ SOLN
5000.0000 [IU] | Freq: Three times a day (TID) | INTRAMUSCULAR | Status: DC
  Administered 2022-06-28 – 2022-06-29 (×3): 5000 [IU] via SUBCUTANEOUS
  Filled 2022-06-28 (×3): qty 1

## 2022-06-28 NOTE — Progress Notes (Signed)
eLink Physician-Brief Progress Note Patient Name: CHRLES SELLEY DOB: 03-12-1935 MRN: [id number]   Date of Service  06/28/2022  HPI/Events of Note  Chest pain.  Patient somnolent.  Receiving dilaudid for hip.  EKG with nonspecific changes.  Hemodynamics stable.  Increased O2 needs.  ? hypovent  eICU Interventions  Abg, cxr, cardiac enzymes.     Intervention Category Intermediate Interventions: Other:  Mauri [place], P 06/28/2022, 10:12 PM

## 2022-06-28 NOTE — Consult Note (Signed)
[facility] Telemedicine Consult   Requesting Provider: Dr. Tyson Babinski Consult Participants: Dr. Marthe Patch, Telespecialist RN Dorathy Daft   bedside RN Joice Lofts Location of the provider [facility] Location of the patient: [facility] [address]  This consult was provided via telemedicine with 2-way video and audio communication. The patient/family was informed that care would be provided in this way and agreed to receive care in this manner.   Chief Complaint: Altered mental status  HPI: 88 year old man, retired Technical sales engineer of the [employer], past medical history of atrial fibrillation not on anticoagulation, hypertension, hyperlipidemia, currently being treated for complex right hip problems with recent resection arthroplasty and recent presentation with anemia and persistent bloody drainage from his right hip Hemovac wound being treated by orthopedics, last known well yesterday when I spoke to him on the phone at 8:30 PM and overnight had change in his mentation, became more drowsy and lethargic and chest x-ray showed worsening of pneumonia along with general decrease in responsiveness, code stroke called this morning because he was completely nonverbal although awake. The patient wife is at bedside.  She reports that she was with him until 4:30 PM yesterday, they had dinner and watch the show and then she went home.  He called her back at 8:30 PM to say good night and they spoke and he sounded like his normal self.  When she came in this morning, he was awake but mumbling incomprehensibly and not following commands.  Also look like he had mild facial asymmetry, he was leaning to the right, with possible left facial droop noted by the care team.  They activated a code stroke due to the new focal deficits Overnight, it was reported to have 10 runs of V. tach by telemetry.  They checked on the patient at that time, his oxygen requirement had increased and his mentation had worsened some.  Chest x-ray was done  that had shown worsening of his infiltrates.  He was put on increased oxygenation.  Daptomycin and cefepime were continued which she was on already.  Electrolytes were replaced.  Morning labs were ordered.  ABG showed mild hypoxia but otherwise was okay.   Past Medical History:  Diagnosis Date   A-fib (HCC)    REPORTS WAS DUE TO DEHYDRATION 3 YEARS AGO BUT WAS GIVEN FLUIDS IN ED AND MEDS TO SLOW HR AND DROVE HIMSELF HOME  ; HAD ABLATION X2 WITH CARDIO DR. AKBARY AS PRECAUTION  ; has been on eliquis since but reports he has not taken it for 5 weeks b/c he was told to hold it for the multiple irrigations and debridements for the hip infection. reports his cardiologist is aware he has been holding it;    Arthritis    Atherosclerosis of both carotid arteries    BPH (benign prostatic hyperplasia)    Dysrhythmia    a-fib   Fall    fell on friday 01-16-18;   lost balAnce whIile tryng to get his sock on ;sustained skin tear to left wrist ; no drainage  , scabbed over , and has been covering with bandage    Heart murmur    SINCE HIS 20s   HLD (hyperlipidemia)    Hypertension    Non-rheumatic mitral regurgitation      Current Facility-Administered Medications:    acetaminophen (TYLENOL) tablet 650 mg, 650 mg, Oral, Q6H PRN, 650 mg at 06/27/22 2315 **OR** acetaminophen (TYLENOL) suppository 650 mg, 650 mg, Rectal, Q6H PRN, Ronaldo Miyamoto, Tyrone A, DO   albuterol (PROVENTIL) (  2.5 MG/3ML) 0.083% nebulizer solution 2.5 mg, 2.5 mg, Nebulization, Q2H PRN, Marylyn Ishihara, Tyrone A, DO   baclofen (LIORESAL) tablet 10 mg, 10 mg, Oral, TID, Pokhrel, Laxman, MD, 10 mg at 06/27/22 2156   ceFEPIme (MAXIPIME) 2 g in sodium chloride 0.9 % 100 mL IVPB, 2 g, Intravenous, Q12H, Maricela Bo Erin R, RPH   Chlorhexidine Gluconate Cloth 2 % PADS 6 each, 6 each, Topical, Daily, Kyle, Tyrone A, DO, 6 each at 06/27/22 2113   cholecalciferol (VITAMIN D3) 25 MCG (1000 UNIT) tablet 2,000 Units, 2,000 Units, Oral, Daily, Pokhrel, Laxman, MD, 2,000  Units at 06/27/22 0914   DAPTOmycin (CUBICIN) 500 mg in sodium chloride 0.9 % IVPB, 500 mg, Intravenous, Q2000, Pokhrel, Laxman, MD, Stopped at 06/27/22 2033   dorzolamide-timolol (COSOPT) 2-0.5 % ophthalmic solution 1 drop, 1 drop, Both Eyes, BID, Pokhrel, Laxman, MD, 1 drop at 06/27/22 2155   feeding supplement (ENSURE ENLIVE / ENSURE PLUS) liquid 237 mL, 237 mL, Oral, BID BM, Kyle, Tyrone A, DO, 237 mL at 06/27/22 1452   guaiFENesin (MUCINEX) 12 hr tablet 600 mg, 600 mg, Oral, BID, Kyle, Tyrone A, DO, 600 mg at 06/27/22 2156   HYDROmorphone (DILAUDID) injection 0.5 mg, 0.5 mg, Intravenous, Q4H PRN, Pokhrel, Laxman, MD, 0.5 mg at 06/27/22 2110   ipratropium-albuterol (DUONEB) 0.5-2.5 (3) MG/3ML nebulizer solution 3 mL, 3 mL, Nebulization, Q2H PRN, Pokhrel, Laxman, MD   leptospermum manuka honey (MEDIHONEY) paste 1 Application, 1 Application, Topical, Daily, Paralee Cancel, MD   loperamide (IMODIUM) capsule 4 mg, 4 mg, Oral, QID PRN, Pokhrel, Laxman, MD   meclizine (ANTIVERT) tablet 12.5 mg, 12.5 mg, Oral, BID PRN, Pokhrel, Laxman, MD   methocarbamol (ROBAXIN) tablet 500 mg, 500 mg, Oral, Q6H PRN, Pokhrel, Laxman, MD, 500 mg at 06/27/22 2315   methylPREDNISolone sodium succinate (SOLU-MEDROL) 40 mg/mL injection 40 mg, 40 mg, Intravenous, Q12H, Pokhrel, Laxman, MD, 40 mg at 06/28/22 5102   multivitamin with minerals tablet 1 tablet, 1 tablet, Oral, Daily, Marylyn Ishihara, Tyrone A, DO, 1 tablet at 06/27/22 5852   oxyCODONE (Oxy IR/ROXICODONE) immediate release tablet 10 mg, 10 mg, Oral, Q4H PRN, Pokhrel, Laxman, MD, 10 mg at 06/27/22 2315   potassium chloride SA (KLOR-CON M) CR tablet 40 mEq, 40 mEq, Oral, Daily, Pokhrel, Laxman, MD   ranolazine (RANEXA) 12 hr tablet 500 mg, 500 mg, Oral, BID, Pokhrel, Laxman, MD, 500 mg at 06/27/22 2156   sodium chloride flush (NS) 0.9 % injection 10-40 mL, 10-40 mL, Intracatheter, Q12H, Raenette Rover, NP   sodium chloride flush (NS) 0.9 % injection 10-40 mL, 10-40 mL,  Intracatheter, PRN, Raenette Rover, NP   valACYclovir (VALTREX) tablet 1,000 mg, 1,000 mg, Oral, Daily PRN, Pokhrel, Laxman, MD   zinc sulfate capsule 220 mg, 220 mg, Oral, Daily, Pokhrel, Laxman, MD, 220 mg at 06/27/22 0914   zolpidem (AMBIEN) tablet 5 mg, 5 mg, Oral, QHS PRN, Pokhrel, Laxman, MD    LKW: 2030 hrs. on 05/07/2023 tpa given?: No, active bleeding and outside the window IR Thrombectomy? No, no ELVO Modified Rankin scale-4-5 given that he has been bedbound from his hip surgery complications Time of teleneurologist evaluation: 0859 hrs Time of page received 858  Exam: Vitals:   06/28/22 0245 06/28/22 0505  BP:  104/62  Pulse:  87  Resp:  20  Temp:  98 F (36.7 C)  SpO2: 95% 96%    General: Awake, alert does not appear to be distress Neurological exam He is awake alert, attends to the examiner when called loudly.  Does not follow commands.  He is not exactly nonverbal but mumble some incomprehensible words. Pupils equal round react light, extraocular movements appear unhindered, does not blink to threat from either side consistently, unclear of facial asymmetry with the limitations of camera. Motor examination with maximum 2/5 in both upper extremities.  Cannot move the right lower extremity due to his hip issues.  Has at least 2-3/5 strength in the left hip flexors based on withdrawal to noxious stimulation   NIHSS 1A: Level of Consciousness - 0 1B: Ask Month and Age - 2 1C: 'Blink Eyes' & 'Squeeze Hands' - 2 2: Test Horizontal Extraocular Movements - 0 3: Test Visual Fields - 0 4: Test Facial Palsy - 0 5A: Test Left Arm Motor Drift - 3 5B: Test Right Arm Motor Drift - 3 6A: Test Left Leg Motor Drift - 3 6B: Test Right Leg Motor Drift - 4 7: Test Limb Ataxia - 0 8: Test Sensation - 0 9: Test Language/Aphasia- 3 10: Test Dysarthria - 2 11: Test Extinction/Inattention - 0 NIHSS score: 22   Imaging Reviewed: CT head: No bleed, old left basal ganglia lacunar  infarct, aspects 10,  Labs reviewed in epic and pertinent values follow: CBC    Component Value Date/Time   WBC 6.4 06/28/2022 0155   RBC 3.13 (L) 06/28/2022 0155   HGB 9.6 (L) 06/28/2022 0155   HCT 29.7 (L) 06/28/2022 0155   PLT 224 06/28/2022 0155   MCV 94.9 06/28/2022 0155   MCH 30.7 06/28/2022 0155   MCHC 32.3 06/28/2022 0155   RDW 21.3 (H) 06/28/2022 0155   LYMPHSABS 0.4 (L) 06/25/2022 0957   MONOABS 0.5 06/25/2022 0957   EOSABS 0.1 06/25/2022 0957   BASOSABS 0.0 06/25/2022 0957   CMP     Component Value Date/Time   NA 137 06/28/2022 0155   K 3.2 (L) 06/28/2022 0155   CL 106 06/28/2022 0155   CO2 21 (L) 06/28/2022 0155   GLUCOSE 137 (H) 06/28/2022 0155   BUN 27 (H) 06/28/2022 0155   CREATININE 1.03 06/28/2022 0155   CALCIUM 8.1 (L) 06/28/2022 0155   PROT 5.5 (L) 06/26/2022 0415   ALBUMIN 1.7 (L) 06/26/2022 0415   AST 39 06/26/2022 0415   ALT 22 06/26/2022 0415   ALKPHOS 68 06/26/2022 0415   BILITOT 1.2 06/26/2022 0415   GFRNONAA >60 06/28/2022 0155   GFRAA >60 02/25/2019 1200     Assessment: 88 year old with above past medical history with worsening mentation since last night and concern for aphasia as well.  On my examination, looks more encephalopathic with issues with attention concentration and also with his speech.  No other focal findings. His chest x-ray showed severe worsening of his infiltrates I suspect that this is likely a clinical presentation consistent with toxic metabolic encephalopathy but given his history of A-fib not on anticoagulation, I will obtain stat CT angiography to rule out LVO.  Impression Evaluate for LVO stroke Evaluate for toxic metabolic encephalopathy  Recommendations:  Stat CT angio head and neck If there is an LVO, will consider thrombectomy Management of toxic metabolic derangements per primary team  Addendum 9:36 AM CT angiography completed patient brought to the room in the ICU.  Upon moving him from the floor bed to  the ICU bed, he verbalized some words expressing discomfort but did not really follow commands.  He continues to move both his upper extremities without any focal weakness that was observable on camera. CT angiography head and neck reviewed.  No emergent LVO.  CT perfusion with no perfusion deficits  Updated impression Likely toxic metabolic encephalopathy An MRI would be helpful to rule out scattered embolic infarcts No ELVO-no need for EVT.  Updated recommendations Treatment of toxic metabolic derangements per primary team as you are Obtain an MRI brain when able to-given his history of A-fib and not on anticoagulation. Please call if the MRI shows a stroke.   Not a candidate for TNKase due to being outside the window and also because of recent surgery and ongoing anemia and bleed. No emergent LVO to make him a candidate for thrombectomy  Plan was discussed with Dr. Louanne Belton Plan was also discussed with the wife over the camera  -- Amie Portland, MD Neurologist [facility] Pager: [phone]   [place] Performed by: Amie Portland, MD Total critical care time: 42 minutes Critical care time was exclusive of separately billable procedures and treating other patients and/or supervising APPs/Residents/Students Critical care was necessary to treat or prevent imminent or life-threatening deterioration. This patient is critically ill and at significant risk for neurological worsening and/or death and care requires constant monitoring. Critical care was time spent personally by me on the following activities: development of treatment plan with patient and/or surrogate as well as nursing, discussions with consultants, evaluation of patient's response to treatment, examination of patient, obtaining history from patient or surrogate, ordering and performing treatments and interventions, ordering and review of laboratory studies, ordering and review of radiographic  studies, pulse oximetry, re-evaluation of patient's condition, participation in multidisciplinary rounds and medical decision making of high complexity in the care of this patient.

## 2022-06-28 NOTE — Progress Notes (Signed)
Nutrition Follow-up  DOCUMENTATION CODES:   Non-severe (moderate) malnutrition in context of chronic illness  INTERVENTION:  - Per CCM, starting trickle feeds today once tube placed and placement verified. Not advancing yet.   - Once able to advance TF, recommend: Vital 1.5 at 55 ml/h (1320 ml per day) *Start at 54mL/hr and advance by 60mL Q8H to goal once medically appropriate Provides 1980 kcal, 89 gm protein, 1008 ml free water daily  - Monitor magnesium, potassium, and phosphorus BID for at least 3 days, MD to replete as needed. - Monitor weight trends   NUTRITION DIAGNOSIS:   Moderate Malnutrition related to chronic illness as evidenced by moderate muscle depletion, moderate fat depletion. *ongoing  GOAL:   Patient will meet greater than or equal to 90% of their needs *starting TF  MONITOR:   PO intake, Supplement acceptance, Weight trends  REASON FOR ASSESSMENT:   Consult Assessment of nutrition requirement/status  ASSESSMENT:   88 y.o. male with past medical history of atrial fibrillation, hyperlipidemia, hypertension, and recnet total hip arthroplasty with antibiotic spacer (06/06/2022) who presented to hospital with weakness swelling in his arms and legs over the last few days  1/9 Admit 1/12 increased oxygen requirement and lethargic, transferred to ICU   Patient now presenting with acute encephalopathy and acute hypoxemic respiratory failure.  Per CCM, plan to place Dobbhoff and start trickle tube feeds today. Per discussion with MD plan to not advance past 55mL/hr yet.   Plan discussed with RN. Feeding tube not yet placed as patient scheduled for MRI later today.   Medications reviewed and include: 2000 units vitamin D, MVI, 220mg  Zinc, Vancomycin  Labs reviewed:  K+ 3.2    Diet Order:   Diet Order             Diet NPO time specified  Diet effective ____                   EDUCATION NEEDS:  Education needs have been  addressed  Skin:  Skin Assessment: Skin Integrity Issues: Skin Integrity Issues:: Stage I Stage I: Mid Buttocks  Last BM:  1/11  Height:  Ht Readings from Last 1 Encounters:  06/25/22 5\' 8"  (1.727 m)   Weight:  Wt Readings from Last 1 Encounters:  06/28/22 72.6 kg   Ideal Body Weight:  70 kg  BMI:  Body mass index is 24.34 kg/m.  Estimated Nutritional Needs:  Kcal:  1800-2000 kcals Protein:  85-100 grams Fluid:  >/= 1.8L    Samson Frederic RD, LDN For contact information, refer to [facility].

## 2022-06-28 NOTE — Progress Notes (Signed)
Notified on-call provider for pt's 16 beats of vtach, changes in VS and orientation. Pt is on high flow 10L from 3L , received new orders include CXR, EKG, CBC, BMP, Mg, and IV potassium replacements.

## 2022-06-28 NOTE — Consult Note (Signed)
NAME:  Travis Wolfe, MRN:  [id number], DOB:  [birth year], LOS: 3 ADMISSION DATE:  06/25/2022, CONSULTATION DATE:  06/28/22 REFERRING MD:  Joycelyn Das, MD CHIEF COMPLAINT:  respiratory failure   History of Present Illness:  Travis Wolfe is an 88 year old male with history of atrial fibrillation on eliquis, hypertension and complex surgical history of the right hip with recent arthroplasty resection with placement of antibiotic spacer and started on daptomycin and ceftriaxone 06/07/22, who was admitted 06/25/22 from SNF with weakness and drop in hemoglobin. He was started on antibiotics for multifocal pneumonia based on chest radiograph on admission.  06/28/21 code stroke was called due to altered mentation and inability to move his upper extremities along with facial asymmetry. CTA head was negative for stroke.   He was more alert and responsive at time of evaluation in the ICU. His wife was at the bedside.   Orthopedics was planning return to OR on 1/13 but has post-poned that procedure at this time.   Pertinent  Medical History   Atrial Fibrillation Hypertension Mitral regurgitation  Significant Hospital Events: Including procedures, antibiotic start and stop dates in addition to other pertinent events   1/9 admitted to [facility] for weakness and anemia. Concern for pneumonia.  1/12 transferred to ICU for code stroke and increasing O2 needs   Interim History / Subjective:   Patient without acute complaints.  His wife at bedside, all updates provided.  She reports a productive cough with clear/white mucous over past 2 days  Objective   Blood pressure 104/62, pulse 90, temperature 98 F (36.7 C), temperature source Oral, resp. rate (!) 23, height 5\' 8"  (1.727 m), weight 72.6 kg, SpO2 93 %.    FiO2 (%):  [60 %] 60 %   Intake/Output Summary (Last 24 hours) at 06/28/2022 1126 Last data filed at 06/28/2022 0533 Gross per 24 hour  Intake 1220.06 ml  Output 1050 ml  Net 170.06 ml    Filed Weights   06/25/22 0940 06/27/22 0500 06/28/22 0505  Weight: 67.1 kg 72.5 kg 72.6 kg    Examination: General: elderly male, no acute distress, ill appearing HENT: AT/Ray, moist mucous membranes, sclera anicteric Lungs: course breath sounds, no wheezing Cardiovascular: rrr, no murmurs Abdomen: soft, non-tender, non-distended, BS+ Extremities: warm, trace edema Neuro: able to follow commands with multiple prompts, A&Ox3 but slow to respond, unable to move RLE due to hip issues,  GU: external foley   Echo 06/26/22 LV EF 55% RV systolic function and size are normal, but noted to have elevated pulmonary pressures of 08/25/22.   Resolved Hospital Problem list     Assessment & Plan:   Acute Encephalopathy In setting of toxic metabolic derangements with respiratory failure and possible sepsis. CTA head is negative for stroke. He has been on cefepime since admission.  - monitor neuro status closely - Neurology evaluated patient this AM - will stop cefepime and change to zosyn - check brain MRI  Acute Hypoxemic Respiratory Failure In setting of progressive bilateral infiltrates. He has been treated with antibiotics with no response. He has been on daptomycin for > 2 weeks. 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  - stop daptomycin, change to vanc - continue solumedrol 40mg  BID that was started this morning - HCAP coverage with zosyn, vanc and azithromycin - discussed role of bronchoscopy in helping with diagnosis, but risk of patient remaining intubated after the procedure is high. Discussed with wife, plan is to hold off on bronchoscopy at this  time. - check BNP, LDH, fungitell and urine legionella - check ddimer  Sepsis due to Pneumonia vs other source - check blood cultures, UA - Zosyn, vanc and azithro for antibiotic coverage - check lactic acid  Hypokalemia Hypomagnesemia - replete as needed  Anasarca Moderate Protein Calorie Malnutrition - will have  doboff placed and start tube feeds  Paroxysmal atrial fibrillation Hypertension - anticoagulation on hold to transfusion requirements initially - currently rate controlled   Best Practice (right click and "Reselect all SmartList Selections" daily)   Diet/type: tubefeeds DVT prophylaxis: prophylactic heparin  GI prophylaxis: N/A Lines: Central line Foley:  N/A Code Status:  full code Last date of multidisciplinary goals of care discussion [Discussed with patient's wife at bedside 1/12]  Labs   CBC: Recent Labs  Lab 06/25/22 0957 06/25/22 1831 06/26/22 0415 06/27/22 0552 06/28/22 0155  WBC 7.5  --  8.1 8.7 6.4  NEUTROABS 6.5  --   --   --   --   HGB 5.9* 8.1* 9.2* 8.6* 9.6*  HCT 18.8* 25.8* 28.3* 26.8* 29.7*  MCV 102.7*  --  92.8 93.1 94.9  PLT 249  --  261 242 224    Basic Metabolic Panel: Recent Labs  Lab 06/25/22 0957 06/26/22 0415 06/27/22 0552 06/28/22 0155  NA 135 135 139 137  K 4.3 3.8 3.3* 3.2*  CL 105 105 106 106  CO2 24 24 26  21*  GLUCOSE 110* 97 109* 137*  BUN 31* 27* 23 27*  CREATININE 1.06 0.85 0.84 1.03  CALCIUM 7.9* 7.9* 8.1* 8.1*  MG  --   --  2.0 1.9   GFR: Estimated Creatinine Clearance: 50.7 mL/min (by C-G formula based on SCr of 1.03 mg/dL). Recent Labs  Lab 06/25/22 0957 06/26/22 0415 06/27/22 0552 06/28/22 0155  WBC 7.5 8.1 8.7 6.4    Liver Function Tests: Recent Labs  Lab 06/25/22 1837 06/26/22 0415  AST  --  39  ALT  --  22  ALKPHOS  --  68  BILITOT  --  1.2  PROT  --  5.5*  ALBUMIN 1.9* 1.7*   No results for input(s): "LIPASE", "AMYLASE" in the last 168 hours. No results for input(s): "AMMONIA" in the last 168 hours.  ABG    Component Value Date/Time   PHART 7.47 (H) 06/28/2022 0815   PCO2ART 35 06/28/2022 0815   PO2ART 62 (L) 06/28/2022 0815   HCO3 25.6 06/28/2022 0815   TCO2 24 06/06/2022 1213   ACIDBASEDEF 3.0 (H) 06/06/2022 1213   O2SAT 95.2 06/28/2022 0815     Coagulation Profile: Recent Labs  Lab  06/25/22 0957  INR 1.7*    Cardiac Enzymes: No results for input(s): "CKTOTAL", "CKMB", "CKMBINDEX", "TROPONINI" in the last 168 hours.  HbA1C: No results found for: "HGBA1C"  CBG: Recent Labs  Lab 06/28/22 0844  GLUCAP 114*    Review of Systems:   Unable to perform ROV due to mental status  Past Medical History:  He,  has a past medical history of A-fib (HCC), Arthritis, Atherosclerosis of both carotid arteries, BPH (benign prostatic hyperplasia), Dysrhythmia, Fall, Heart murmur, HLD (hyperlipidemia), Hypertension, and Non-rheumatic mitral regurgitation.   Surgical History:   Past Surgical History:  Procedure Laterality Date   BACK SURGERY  2007   L1-L5 LUMBAR WITH HARDWARE PLACED   CARDIOVERSION     CATARACT EXTRACTION, BILATERAL  2015   CRYOABLATION  2023   CARDIAC ABLATION and 2019   DACROCYSTORHINOSTOMY  07/26/2011   EXCISIONAL TOTAL HIP  ARTHROPLASTY WITH ANTIBIOTIC SPACERS Right 06/06/2022   Procedure: EXCISIONAL TOTAL HIP ARTHROPLASTY WITH ANTIBIOTIC SPACERS;  Surgeon: Paralee Cancel, MD;  Location: [facility];  Service: Orthopedics;  Laterality: Right;   HEMATOMA EVACUATION Right 01/31/2022   Procedure: Evacuation of right hip hematoma;  Surgeon: Paralee Cancel, MD;  Location: [facility];  Service: Orthopedics;  Laterality: Right;  90 mins   HEMATOMA EVACUATION Right 05/02/2022   Procedure: Evacuation of right hip Seroma;  Surgeon: Paralee Cancel, MD;  Location: [facility];  Service: Orthopedics;  Laterality: Right;   HERNIA REPAIR Bilateral    hx of echocardiogram      INCISION AND DRAINAGE HIP Right 01/26/2018   Procedure: Right hip irrigation and debridement, excisional and non excisional debridement, head liner exchange, posterior approach;  Surgeon: Paralee Cancel, MD;  Location: [facility];  Service: Orthopedics;  Laterality: Right;  90 mins Would like to start earlier around 2:00pm if time opens   [place] Right 11/26/2018   Procedure: IRRIGATION AND  DEBRIDEMENT HIP;  Surgeon: Paralee Cancel, MD;  Location: [facility];  Service: Orthopedics;  Laterality: Right;  90 mins   INCISION AND DRAINAGE HIP Right 05/17/2021   Procedure: IRRIGATION AND DEBRIDEMENT RIGHT HIP, EVACUATION OF PSEUDOTUMOR;  Surgeon: Paralee Cancel, MD;  Location: [facility];  Service: Orthopedics;  Laterality: Right;   INGUINAL HERNIA REPAIR Bilateral    with mesh    IR US GUIDE BX ASP/DRAIN  12/17/2017   IRRIGATION AND ASPIRATION RIGHT HIP   12/10/2017   [place]  x2, [place] x1    KIDNEY STONE SURGERY     lithotripsy    KYPHOPLASTY     T7   LATERAL FUSION LUMBAR SPINE     L1-L5   PULMONARY VEIN ISOLATION AND LEFT ATRIAL ROOFLINE ABLATION   10/15/2017   DR Minna Merritts    ROTATOR CUFF REPAIR Left    SHOULDER DEBRIDEMENT Right    SHOULDER OPEN ROTATOR CUFF REPAIR Left    SHOULDER SURGERY  1988   arthroscopy     TONSILLECTOMY     TOTAL HIP ARTHROPLASTY Right 2003   TOTAL HIP ARTHROPLASTY  2010   TOTAL HIP REVISION      05-2017, 10-2016     Social History:   reports that he has never smoked. He has never used smokeless tobacco. He reports current alcohol use of about 3.0 standard drinks of alcohol per week. He reports that he does not use drugs.   Family History:  His family history includes Cancer in his mother; Hypertension in his mother.   Allergies Allergies  Allergen Reactions   Tape Dermatitis and Rash    ONLY use Paper tape   Other     Cat dander   Doxycycline Rash   Horse Epithelium Rash     Home Medications  Prior to Admission medications   Medication Sig Start Date End Date Taking? Authorizing Provider  acetaminophen (TYLENOL) 325 MG tablet Take 325 mg by mouth every 4 (four) hours as needed for mild pain.   Yes [provider]  acetaminophen (TYLENOL) 500 MG tablet Take 2 tablets (1,000 mg total) by mouth every 6 (six) hours. 06/12/22  Yes Irving Copas, PA-C  apixaban (ELIQUIS) 2.5 MG TABS tablet Take 2.5 mg by mouth 2 (two) times daily.    Yes [provider]  Biotin 5 MG CAPS Take 5 mg by mouth daily.   Yes [provider]  cefTRIAXone (ROCEPHIN) IVPB Inject 2 g into the vein  daily. Indication:  R hip PJI First Dose: Yes Last Day of Therapy:  07/19/2022 Labs - Once weekly:  CBC/D, CMP, CPK, ESR and CRP Please leave PIC in place until doctor has seen patient or been notified Fax weekly labs to [phone] Method of administration: IV Push Method of administration may be changed at the discretion of home infusion pharmacist based upon assessment of the patient and/or caregiver's ability to self-administer the medication ordered. 06/09/22 07/19/22 Yes Irving Copas, PA-C  Cholecalciferol (VITAMIN D) 50 MCG (2000 UT) tablet Take 2,000 Units by mouth daily.   Yes [provider]  COSOPT PF 22.3-6.8 MG/ML SOLN ophthalmic solution Place 1 drop into both eyes in the morning and at bedtime. 10/22/17  Yes [provider]  daptomycin (CUBICIN) IVPB Inject 500 mg into the vein daily. Indication:  R hip PJI First Dose: Yes Last Day of Therapy:  07/19/2022 Labs - Once weekly:  CBC/D, CMP, CPK, ESR and CRP Please leave PIC in place until doctor has seen patient or been notified Fax weekly labs to [phone] Method of administration: IV Push Method of administration may be changed at the discretion of home infusion pharmacist based upon assessment of the patient and/or caregiver's ability to self-administer the medication ordered. 06/09/22 07/19/22 Yes Irving Copas, PA-C  docusate sodium (COLACE) 100 MG capsule Take 1 capsule (100 mg total) by mouth 2 (two) times daily. 05/03/22  Yes Irving Copas, PA-C  Eszopiclone 3 MG TABS Take 3 mg by mouth at bedtime. 02/12/18  Yes [provider]  Ferrous Sulfate (SLOW RELEASE IRON PO) Take 325 mg by mouth daily.   Yes [provider]  furosemide (LASIX) 20 MG tablet Take 20 mg by mouth daily as needed for fluid. 12/07/21  Yes [provider]  ipratropium-albuterol (DUONEB) 0.5-2.5 (3) MG/3ML SOLN Take 3 mLs by nebulization every 4 (four) hours as needed (wheezing).   Yes [provider]  lidocaine (LIDODERM) 5 % Place 1 patch onto the skin daily. Remove & Discard patch within 12 hours or as directed by MD 06/05/22  Yes Maylon Peppers, Norman Herrlich, DO  loperamide (IMODIUM A-D) 2 MG tablet Take 4 mg by mouth 4 (four) times daily as needed for diarrhea or loose stools.   Yes [provider]  Lutein 20 MG TABS Take 20 mg by mouth daily.   Yes [provider]  meclizine (ANTIVERT) 12.5 MG tablet Take 1 tablet (12.5 mg total) by mouth 2 (two) times daily as needed for dizziness. 06/12/22  Yes Irving Copas, PA-C  methocarbamol (ROBAXIN) 500 MG tablet Take 1 tablet (500 mg total) by mouth every 6 (six) hours as needed for muscle spasms. 06/12/22  Yes Irving Copas, PA-C  Multiple Minerals (CALCIUM-MAGNESIUM-ZINC) TABS Take 1 tablet by mouth daily.   Yes [provider]  oxyCODONE (OXY IR/ROXICODONE) 5 MG immediate release tablet Take 1-2 tablets (5-10 mg total) by mouth every 4 (four) hours as needed for severe pain (1 tab pain score 4-6, 2 tabs pain score 7-10). 06/12/22  Yes Irving Copas, PA-C  OXYGEN Inhale 2 L into the lungs 3 (three) times daily as needed (shortness of breath).   Yes [provider]  polyethylene glycol (MIRALAX / GLYCOLAX) 17 g packet Take 17 g by mouth daily as needed for mild constipation. 05/18/21  Yes Irving Copas, PA-C  potassium chloride (KLOR-CON) 10 MEQ tablet Take 10 mEq by mouth daily. 12/15/21  Yes [provider]  ranolazine (RANEXA) 500 MG 12 hr tablet Take 500 mg by mouth 2 (two) times daily. 04/30/22  Yes [provider]  tadalafil (CIALIS) 5 MG tablet Take 5 mg by mouth daily as needed for erectile dysfunction. 02/02/22  Yes [provider]  valACYclovir (VALTREX) 1000 MG tablet Take 1,000 mg by mouth daily as  needed (fever blister). 04/11/21  Yes [provider]  zinc gluconate 50 MG tablet Take 25 mg by mouth 2 (two) times daily.   Yes [provider]  azithromycin (ZITHROMAX) 250 MG tablet Take 250 mg by mouth daily. 06/25/22   [provider]     Critical care time: 50 minutes    Melody Comas, MD [facility] Office: [phone]   See Amion for personal pager PCCM on call pager [phone] until 7pm. Please call Elink 7p-7a. [phone]

## 2022-06-28 NOTE — Progress Notes (Signed)
CPT via bed held at this time. Family aware and agree

## 2022-06-28 NOTE — Progress Notes (Signed)
PROGRESS NOTE    Travis Wolfe  [id number] DOB: 06-24-1934 DOA: 06/25/2022 PCP: Derrill Center., MD    Brief Narrative:   Travis Wolfe is a 88 y.o. male with past medical history of atrial fibrillation, hyperlipidemia, hypertension presented to the hospital with weakness swelling in his arms and legs over the last few days.  He had a wound VAC which was draining excessively.  At the facility, labs were drawn and had significant drop in his hemoglobin so he was sent to the hospital for further evaluation and treatment.   During hospitalization, patient continued to have increased oxygen demand and was less responsive so rapid response was called in.  Code stroke was called in because of uncertainty about his mentation and weakness.  Stroke workup was negative.  Patient was then transferred to the ICU for closer monitoring possible need for BiPAP.  PCCM was consulted.  Assessment and plan  Acute respiratory distress, needed high oxygen demand including none reviewed the mask in 10 L of high flow nasal cannula oxygen.  ABG on 10 L of oxygen did not show any hypercarbia but patient had decreased mentation.  Code stroke was called in which was negative.  At this time patient has been transferred to stepdown unit for possible BiPAP/PCCM evaluation.  Communicated with Dr. Erin Fulling, PCCM regarding consult.  Patient is on daptomycin and cefepime at this time.  Concern for stroke.  CT scan of the head including CT angio negative.  Patient does have history of atrial fibrillation on Eliquis is on hold due to hematoma need for PRBC.  Will benefit likely from MRI at some point to rule out stroke not that he is a good anticoagulation candidate at this time.  Symptomatic anemia from right hip bleeding/hematoma.   Likely related to eliquis use. 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  Hemoglobin on presentation was 5.9.  Eliquis on hold.  Received 2 units of packed RBC and  hemoglobin at this time at 9.6.  Orthopedics Dr. Alvan Dame on board and will follow orthopedic recommendations for possible surgical intervention.  Patient is on PICC line and is currently getting daptomycin and Rocephin as outpatient.  Fecal occult negative.  Hypokalemia.  Potassium of 3.2 today.  Will replenish will give 2 g of magnesium sulfate.  Paroxysmal ventricular tachycardia.  Will continue electrolyte replacement, oxygenation.  Will continue potassium and magnesium supplementation.  Multifocal pneumonia Chest x-ray suggestive of patchy alveolar infiltrates.  Patient was initially on vancomycin and cefepime.  He was on daptomycin and Rocephin as outpatient so was changed back to daptomycin.  Continue nebulizer, Mucinex.   Currently in respiratory distress requiring nonrebreather mask.  PCCM has been consulted.   Anasarca   BNP at 346.  Albumin very low at 1.9.  2D echocardiogram showing LV ejection fraction 55 to 60% with LVH.  Received 1 dose of IV albumin with Lasix on 06/26/2022.  Will give 1 dose of IV Lasix 40 today.  Might need further albumin injections.  Moderate protein-calorie malnutrition.  As evidenced by moderate muscle depletion, moderate fat depletion.Body mass index is 24.34 kg/m. Present on admission.  Nutrition team on board.  Continue supplements with Ensure plus, multivitamin.  Paroxysmal atrial fibrillation Essential hypertension    Holding Eliquis due to symptomatic anemia.  Patient is not on  nodal blockers.  On Lasix daily at home.     DVT prophylaxis: SCDs Start: 06/26/22 0256   Code Status:     Code  Status: Full Code  Disposition: Uncertain at this time.  Likely to rehabilitation when medically stable but plan for surgical intervention as well..  Transferring the patient to stepdown unit.  Status is: Inpatient  Remains inpatient appropriate because: IV antibiotics, status post blood transfusion, S acute respiratory distress, need for stepdown admission,  orthopedic surgery for possible intervention.   Family Communication:  Spoke with the patient's wife at bedside on 06/28/2022.  Explained to her about the worsening condition of the patient including increasing respiratory distress decreased responsiveness.  She wished her husband to be a full code.  Explained to her that his clinical condition might continue to decline.  Consultants:  Orthopedics PCCM  Procedures:  PRBC 2 units  Antimicrobials:  Daptomycin and cefepime IV  Anti-infectives (From admission, onward)    Start     Dose/Rate Route Frequency Ordered Stop   06/27/22 2000  DAPTOmycin (CUBICIN) 500 mg in sodium chloride 0.9 % IVPB        500 mg 120 mL/hr over 30 Minutes Intravenous Daily 06/27/22 1111     06/27/22 1400  vancomycin (VANCOREADY) IVPB 1500 mg/300 mL  Status:  Discontinued        1,500 mg 150 mL/hr over 120 Minutes Intravenous Every 24 hours 06/27/22 1042 06/27/22 1112   06/27/22 1400  vancomycin (VANCOREADY) IVPB 500 mg/100 mL        500 mg 100 mL/hr over 60 Minutes Intravenous  Once 06/27/22 1112 06/27/22 1551   06/27/22 0758  valACYclovir (VALTREX) tablet 1,000 mg        1,000 mg Oral Daily PRN 06/27/22 0758     06/26/22 2000  vancomycin (VANCOCIN) IVPB 1000 mg/200 mL premix  Status:  Discontinued        1,000 mg 200 mL/hr over 60 Minutes Intravenous Every 24 hours 06/25/22 1746 06/27/22 1042   06/26/22 1600  ceFEPIme (MAXIPIME) 2 g in sodium chloride 0.9 % 100 mL IVPB        2 g 200 mL/hr over 30 Minutes Intravenous Every 8 hours 06/26/22 1102     06/25/22 1800  vancomycin (VANCOREADY) IVPB 1500 mg/300 mL        1,500 mg 150 mL/hr over 120 Minutes Intravenous  Once 06/25/22 1746 06/25/22 2316   06/25/22 1800  ceFEPIme (MAXIPIME) 2 g in sodium chloride 0.9 % 100 mL IVPB  Status:  Discontinued        2 g 200 mL/hr over 30 Minutes Intravenous Every 12 hours 06/25/22 1746 06/26/22 1102      Subjective: Today, patient was seen and examined at bedside.   Rapid response was called in due to increasing oxygen demand overnight.  Nursing staff reported some episodes of ventricular tachycardia as well.  Patient has decreased responsiveness and uncertain weakness on the right side.  Code stroke was called in.  Patient's wife at bedside.    Objective: Vitals:   06/28/22 0145 06/28/22 0214 06/28/22 0245 06/28/22 0505  BP: (!) 100/52   104/62  Pulse: 94 (!) 101  87  Resp: (!) 22 20  20   Temp:    98 F (36.7 C)  TempSrc:    Oral  SpO2: 92% 96% 95% 96%  Weight:    72.6 kg  Height:        Intake/Output Summary (Last 24 hours) at 06/28/2022 0817 Last data filed at 06/28/2022 0533 Gross per 24 hour  Intake 1529.09 ml  Output 1050 ml  Net 479.09 ml    08/27/2022  06/25/22 0940 06/27/22 0500 06/28/22 0505  Weight: 67.1 kg 72.5 kg 72.6 kg    Physical Examination: Body mass index is 24.34 kg/m.  General: Alert awake but unable to express much, elderly male, appears deconditioned and weak.  In mild respiratory distress. HENT:   Mild pallor noted, oral mucosa is moist.  Chest:  .  Diminished breath sounds bilaterally.  Coarse breath sounds noted. CVS: S1 &S2 heard. No murmur.  Regular rate and rhythm. Abdomen: Soft, nontender, nondistended.  Bowel sounds are heard.  Abdominal wall edema Extremities: No cyanosis, clubbing but with bilateral lower extremity edema.  Peripheral pulses are palpable.  Right hip with dressing wound VAC. Psych: Alert, awake but unable to communicate appropriately, CNS: Awake, generalized weakness noted, Skin: Warm and dry.  Right thigh with wound VAC.  Data Reviewed:   CBC: Recent Labs  Lab 06/25/22 0957 06/25/22 1831 06/26/22 0415 06/27/22 0552 06/28/22 0155  WBC 7.5  --  8.1 8.7 6.4  NEUTROABS 6.5  --   --   --   --   HGB 5.9* 8.1* 9.2* 8.6* 9.6*  HCT 18.8* 25.8* 28.3* 26.8* 29.7*  MCV 102.7*  --  92.8 93.1 94.9  PLT 249  --  261 242 224     Basic Metabolic Panel: Recent Labs  Lab 06/25/22 0957  06/26/22 0415 06/27/22 0552 06/28/22 0155  NA 135 135 139 137  K 4.3 3.8 3.3* 3.2*  CL 105 105 106 106  CO2 24 24 26  21*  GLUCOSE 110* 97 109* 137*  BUN 31* 27* 23 27*  CREATININE 1.06 0.85 0.84 1.03  CALCIUM 7.9* 7.9* 8.1* 8.1*  MG  --   --  2.0 1.9     Liver Function Tests: Recent Labs  Lab 06/25/22 1837 06/26/22 0415  AST  --  39  ALT  --  22  ALKPHOS  --  68  BILITOT  --  1.2  PROT  --  5.5*  ALBUMIN 1.9* 1.7*      Radiology Studies: DG Chest Port 1 View  Result Date: 06/28/2022 CLINICAL DATA:  Respiratory complication, progressive dyspnea EXAM: PORTABLE CHEST 1 VIEW COMPARISON:  06/25/2022 FINDINGS: Lung volumes are small and pulmonary insufflation has diminished since prior examination. Superimposed extensive multifocal airspace infiltrates have progressed, more focal within the right upper lobe, likely infectious or inflammatory. No pneumothorax or pleural effusion. Cardiac size is within normal limits. Right upper extremity PICC line tip noted at the superior cavoatrial junction. No acute bone abnormality. IMPRESSION: 1. Progressive pulmonary hypoinflation. 2. Progressive multifocal airspace infiltrates, likely infectious or inflammatory. Electronically Signed   By: Fidela Salisbury M.D.   On: 06/28/2022 03:07   ECHOCARDIOGRAM COMPLETE  Result Date: 06/26/2022    ECHOCARDIOGRAM REPORT   Patient Name:   Travis Wolfe Date of Exam: 06/26/2022 Medical Rec #:  [id number]        Height:       68.0 in Accession #:    [id number]       Weight:       148.0 lb Date of Birth:  [birth year]       BSA:          1.798 m Patient Age:    30 years         BP:           144/68 mmHg Patient Gender: M                HR:  100 bpm. Exam Location:  Inpatient Procedure: 2D Echo Indications:    CHF  History:        Patient has no prior history of Echocardiogram examinations.  Sonographer:    Cathie Hoops Referring Phys: [id number] Teddy Spike  Sonographer Comments: Technically difficult  study due to poor echo windows. IMPRESSIONS  1. Left ventricular ejection fraction, by estimation, is 55 to 60%. The left ventricle has normal function. The left ventricle has no regional wall motion abnormalities. There is mild left ventricular hypertrophy. Left ventricular diastolic parameters were grossly normal.  2. Right ventricular systolic function is normal. The right ventricular size is normal. There is severely elevated pulmonary artery systolic pressure. The estimated right ventricular systolic pressure is 71.9 mmHg.  3. Mild bileaflet mitral valve prolapse. The mitral valve is myxomatous. Mild to moderate mitral valve regurgitation. No evidence of mitral stenosis.  4. Tricuspid valve regurgitation is mild to moderate.  5. The aortic valve is grossly normal. There is mild calcification of the aortic valve. There is mild thickening of the aortic valve. Aortic valve regurgitation is not visualized. No aortic stenosis is present.  6. The inferior vena cava is normal in size with greater than 50% respiratory variability, suggesting right atrial pressure of 3 mmHg.  7. Left atrial size was mildly dilated. FINDINGS  Left Ventricle: Left ventricular ejection fraction, by estimation, is 55 to 60%. The left ventricle has normal function. The left ventricle has no regional wall motion abnormalities. The left ventricular internal cavity size was normal in size. There is  mild left ventricular hypertrophy. Left ventricular diastolic parameters were normal. Right Ventricle: The right ventricular size is normal. No increase in right ventricular wall thickness. Right ventricular systolic function is normal. There is severely elevated pulmonary artery systolic pressure. The tricuspid regurgitant velocity is 4.15 m/s, and with an assumed right atrial pressure of 3 mmHg, the estimated right ventricular systolic pressure is 71.9 mmHg. Left Atrium: Left atrial size was mildly dilated. Right Atrium: Right atrial size was  normal in size. Pericardium: There is no evidence of pericardial effusion. 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 Mild mitral annular calcification. Mild to moderate mitral valve regurgitation. No evidence of mitral valve stenosis. Tricuspid Valve: The tricuspid valve is normal in structure. Tricuspid valve regurgitation is mild to moderate. No evidence of tricuspid stenosis. Aortic Valve: The aortic valve is grossly normal. There is mild calcification of the aortic valve. There is mild thickening of the aortic valve. Aortic valve regurgitation is not visualized. No aortic stenosis is present. Aortic valve mean gradient measures 4.0 mmHg. Aortic valve peak gradient measures 7.8 mmHg. Pulmonic Valve: The pulmonic valve was normal in structure. Pulmonic valve regurgitation is trivial. No evidence of pulmonic stenosis. Aorta: The aortic root is normal in size and structure and the ascending aorta was not well visualized. Ascending aorta measurements are within normal limits for age when indexed to body surface area. Venous: The inferior vena cava is normal in size with greater than 50% respiratory variability, suggesting right atrial pressure of 3 mmHg. IAS/Shunts: No atrial level shunt detected by color flow Doppler.   LV Volumes (MOD) LV vol d, MOD A2C: 73.6 ml  Diastology LV vol d, MOD A4C: 137.0 ml LV e' medial:    11.80 cm/s LV vol s, MOD A2C: 32.9 ml  LV E/e' medial:  9.5 LV vol s, MOD A4C: 47.8 ml  LV e' lateral:   15.30 cm/s LV SV  MOD A2C:     40.7 ml  LV E/e' lateral: 7.3 LV SV MOD A4C:     137.0 ml LV SV MOD BP:      67.5 ml RIGHT VENTRICLE RV Basal diam:  3.00 cm RV Mid diam:    2.40 cm RV S prime:     21.20 cm/s TAPSE (M-mode): 1.6 cm LEFT ATRIUM           Index        RIGHT ATRIUM          Index LA Vol (A4C): 84.7 ml 47.10 ml/m  RA Area:     8.75 cm                                    RA Volume:   12.90 ml 7.17 ml/m  AORTIC VALVE                   PULMONIC  VALVE AV Vmax:           140.00 cm/s PV Vmax:          1.28 m/s AV Vmean:          93.700 cm/s PV Peak grad:     6.6 mmHg AV VTI:            0.213 m     PR End Diast Vel: 10.24 msec AV Peak Grad:      7.8 mmHg AV Mean Grad:      4.0 mmHg LVOT Vmax:         110.00 cm/s LVOT Vmean:        73.000 cm/s LVOT VTI:          0.169 m LVOT/AV VTI ratio: 0.79  AORTA Ao Root diam: 3.60 cm MITRAL VALVE                  TRICUSPID VALVE MV Area (PHT): 4.94 cm       TR Peak grad:   68.9 mmHg MV Decel Time: 154 msec       TR Vmax:        415.00 cm/s MR Peak grad:    153.4 mmHg MR Mean grad:    104.0 mmHg   SHUNTS MR Vmax:         619.25 cm/s  Systemic VTI: 0.17 m MR Vmean:        485.0 cm/s MR PISA:         2.26 cm MR PISA Eff ROA: 10 mm MR PISA Radius:  0.60 cm MV E velocity: 112.00 cm/s MV A velocity: 71.65 cm/s MV E/A ratio:  1.56 Cherlynn Kaiser MD Electronically signed by Cherlynn Kaiser MD Signature Date/Time: 06/26/2022/11:21:30 PM    Final       LOS: 3 days    Flora Lipps, MD [facility] Available via Epic secure chat 7am-7pm After these hours, please refer to coverage provider listed on [email or web address] 06/28/2022, 8:17 AM

## 2022-06-28 NOTE — Progress Notes (Signed)
eLink Physician-Brief Progress Note Patient Name: Travis Wolfe DOB: 1935/04/30 MRN: [id number]   Date of Service  06/28/2022  HPI/Events of Note  88 yr old male admitted with symptomatic anemia, multifocal pna, chronic hip infx following arthroplasty in the past, complained of chest pain tonight.  EKG nonspecific but did show t wave inversion.  Trop 452.  Sys bp 100'2.  Maintaining O2 sat on 15L [place].  Not c/o chest pain now but wife states occasionally rubbing chest.  Suspect may have angina induced by stress of illness in setting of CAD.  Do not feel he has ACS.  Echo with preserved ef 1/10, no rwma.  eICU Interventions  Follow cardiac enzymes. Repeat EKG in am.     Intervention Category Major Interventions: Other:  Mauri [place], Mamie Nick 06/28/2022, 11:57 PM

## 2022-06-28 NOTE — Evaluation (Signed)
Clinical/Bedside Swallow Evaluation Patient Details  Name: Travis Wolfe MRN: [id number] Date of Birth: 1934/09/12  Today's Date: 06/28/2022 Time: SLP Start Time (ACUTE ONLY): 1205 SLP Stop Time (ACUTE ONLY): 1225 SLP Time Calculation (min) (ACUTE ONLY): 20 min  Past Medical History:  Past Medical History:  Diagnosis Date   A-fib ([place])    REPORTS WAS DUE TO DEHYDRATION 3 YEARS AGO BUT WAS GIVEN FLUIDS IN ED AND MEDS TO SLOW HR AND DROVE HIMSELF HOME  ; HAD ABLATION X2 WITH CARDIO Travis Wolfe AS PRECAUTION  ; has been on eliquis since but reports he has not taken it for 5 weeks b/c he was told to hold it for the multiple irrigations and debridements for the hip infection. reports his cardiologist is aware he has been holding it;    Arthritis    Atherosclerosis of both carotid arteries    BPH (benign prostatic hyperplasia)    Dysrhythmia    a-fib   Fall    fell on friday 01-16-18;   lost balAnce whIile tryng to get his sock on ;sustained skin tear to left wrist ; no drainage  , scabbed over , and has been covering with bandage    Heart murmur    SINCE HIS 20s   HLD (hyperlipidemia)    Hypertension    Non-rheumatic mitral regurgitation    Past Surgical History:  Past Surgical History:  Procedure Laterality Date   BACK SURGERY  2007   L1-L5 LUMBAR WITH HARDWARE PLACED   CARDIOVERSION     CATARACT EXTRACTION, BILATERAL  2015   CRYOABLATION  2023   CARDIAC ABLATION and 2019   DACROCYSTORHINOSTOMY  07/26/2011   EXCISIONAL TOTAL HIP ARTHROPLASTY WITH ANTIBIOTIC SPACERS Right 06/06/2022   Procedure: EXCISIONAL TOTAL HIP ARTHROPLASTY WITH ANTIBIOTIC SPACERS;  Surgeon: Travis Cancel, MD;  Location: [facility];  Service: Orthopedics;  Laterality: Right;   HEMATOMA EVACUATION Right 01/31/2022   Procedure: Evacuation of right hip hematoma;  Surgeon: Travis Cancel, MD;  Location: [facility];  Service: Orthopedics;  Laterality: Right;  90 mins   HEMATOMA EVACUATION Right 05/02/2022   Procedure:  Evacuation of right hip Seroma;  Surgeon: Travis Cancel, MD;  Location: [facility];  Service: Orthopedics;  Laterality: Right;   HERNIA REPAIR Bilateral    hx of echocardiogram      INCISION AND DRAINAGE HIP Right 01/26/2018   Procedure: Right hip irrigation and debridement, excisional and non excisional debridement, head liner exchange, posterior approach;  Surgeon: Travis Cancel, MD;  Location: [facility];  Service: Orthopedics;  Laterality: Right;  90 mins Would like to start earlier around 2:00pm if time opens   [place] Right 11/26/2018   Procedure: IRRIGATION AND DEBRIDEMENT HIP;  Surgeon: Travis Cancel, MD;  Location: [facility];  Service: Orthopedics;  Laterality: Right;  90 mins   INCISION AND DRAINAGE HIP Right 05/17/2021   Procedure: IRRIGATION AND DEBRIDEMENT RIGHT HIP, EVACUATION OF PSEUDOTUMOR;  Surgeon: Travis Cancel, MD;  Location: [facility];  Service: Orthopedics;  Laterality: Right;   INGUINAL HERNIA REPAIR Bilateral    with mesh    IR US GUIDE BX ASP/DRAIN  12/17/2017   IRRIGATION AND ASPIRATION RIGHT HIP   12/10/2017   [place]  x2, [place] x1    KIDNEY STONE SURGERY     lithotripsy    KYPHOPLASTY     T7   LATERAL FUSION LUMBAR SPINE     L1-L5   PULMONARY VEIN ISOLATION AND LEFT ATRIAL ROOFLINE ABLATION  10/15/2017   DR Travis Wolfe    ROTATOR CUFF REPAIR Left    SHOULDER DEBRIDEMENT Right    SHOULDER OPEN ROTATOR CUFF REPAIR Left    SHOULDER SURGERY  1988   arthroscopy     TONSILLECTOMY     TOTAL HIP ARTHROPLASTY Right 2003   TOTAL HIP ARTHROPLASTY  2010   TOTAL HIP REVISION      05-2017, [id number]   HPI:  88 year old man, retired Garment/textile technologist of [employer], past medical history of atrial fibrillation not on anticoagulation, hypertension, hyperlipidemia, currently being treated for complex right hip problems with recent resection arthroplasty and recent presentation with anemia and persistent bloody drainage from his right hip Hemovac wound being treated by  orthopedics, last known well yesterday when I spoke to him on the phone at 8:30 PM and overnight had change in his mentation, became more drowsy and lethargic and chest x-ray showed worsening of pneumonia along with general decrease in responsiveness, code stroke called this morning because he was completely nonverbal although awake.  Fri: for bse CCM- code stroke, negative resp issues.    Assessment / Plan / Recommendation  Clinical Impression  Patient presents with significant barriers to ability to consume po at this time. He is lethargic, RR at rest 20= with po 22-26, severe disorienation, moaning but denying pain and inconsistent respiratory patterns at times.  He repeatedly states cold fountain cola and water - but does not follow directions consistently.  Voice is weak and pt did not cough on command.  SLP provided intake including ice chip, small bolus of applesauce, nectar apple juice, and thin water.    He did not seal his lips - allowing boluses to spill from anterior oral cavity with all intake except very small bolus of applesauce.  At this time, Travis Wolfe is not appropriate for po intake rec NPO, oral moisture or comfort. Will follow up for po readiness - No famlly present to educate.  RN informed. SLP Visit Diagnosis: Dysphagia, oral phase (R13.11);Dysphagia, unspecified (R13.10)    Aspiration Risk  Severe aspiration risk;Risk for inadequate nutrition/hydration    Diet Recommendation NPO  oral moisture       Other  Recommendations Oral Care Recommendations: Oral care QID    Recommendations for follow up therapy are one component of a multi-disciplinary discharge planning process, led by the attending physician.  Recommendations may be updated based on patient status, additional functional criteria and insurance authorization.  Follow up Recommendations Skilled nursing-short term rehab (<3 hours/day)      Assistance Recommended at Discharge  Full  Functional Status Assessment  Patient has had a recent decline in their functional status and demonstrates the ability to make significant improvements in function in a reasonable and predictable amount of time.  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[employer], past medical history of atrial fibrillation not on anticoagulation, hypertension, hyperlipidemia, currently being treated for complex right hip problems with recent resection arthroplasty and recent presentation with anemia and persistent bloody drainage from his right hip Hemovac wound being treated by orthopedics, last known well yesterday when I spoke to him on the phone at 8:30 PM and overnight had change in  his mentation, became more drowsy and lethargic and chest x-ray showed worsening of pneumonia along with general decrease in responsiveness, code stroke called this morning because he was completely nonverbal although awake.  Fri: for bse CCM- code stroke, negative resp issues. Type of Study: Bedside Swallow Evaluation Diet Prior to this Study: NPO Temperature Spikes Noted: No Respiratory Status: Nasal cannula;Other (comment) (HFNC) History of Recent Intubation: No Behavior/Cognition: Lethargic/Drowsy Oral Cavity Assessment: Within Functional Limits Oral Cavity - Dentition: Adequate natural dentition Self-Feeding Abilities: Total assist Patient Positioning: Upright in bed Baseline Vocal Quality: Low vocal intensity Volitional Cough: Cognitively unable to elicit Volitional Swallow: Unable to elicit    Oral/Motor/Sensory Function Overall Oral Motor/Sensory Function: Generalized oral weakness   Ice Chips Ice chips: Impaired Presentation: Spoon Oral Phase Impairments: Reduced labial seal;Reduced  lingual movement/coordination;Poor awareness of bolus Oral Phase Functional Implications: Right anterior spillage;Left anterior spillage;Oral holding Pharyngeal Phase Impairments: Suspected delayed Swallow;Cough - Delayed   Thin Liquid Thin Liquid: Impaired Presentation: Spoon Oral Phase Impairments: Reduced labial seal;Reduced lingual movement/coordination;Poor awareness of bolus Oral Phase Functional Implications: Right anterior spillage;Left anterior spillage;Oral holding    Nectar Thick Nectar Thick Liquid: Impaired Presentation: Spoon Oral Phase Impairments: Reduced labial seal;Reduced lingual movement/coordination;Poor awareness of bolus Oral phase functional implications: Oral holding   Honey Thick Honey Thick Liquid: Not tested   Puree Puree: Not tested   Solid     Solid: Not tested      Chales Abrahams 06/28/2022,12:53 PM Rolena Infante, MS [facility] SLP Acute Rehab Services Office [phone] Pager [phone]

## 2022-06-28 NOTE — Progress Notes (Addendum)
Notified by Orthopedic MD change in mental status. Patient oxygen level sustaining in mid 90's on 10L NCOrthopedic MD asked for Hospitalist team to look at patient for further evaluation. RN notified Hospitalist team of change in mental status. Hospitalist recommended ABG and an evaluation by RT. RN notified RT of patient situation. RT to evaluate patient.

## 2022-06-28 NOTE — Progress Notes (Signed)
CROSS COVER NOTE  NAME: Travis Wolfe MRN: [id number] DOB : 29-Apr-1935    Date of Service   06/28/2022   HPI/Events of Note   Notified by RN with concern for inc oxygen need.  Patient previously on 6 L -->  now requiring 6 L and nonrebreather to maintain O2 > 92%.    Chest x-ray, EKG, and morning labs to be completed.   Patient currently in sinus tachycardia, HR 100's Electrolytes reviewed, potassium 3.1.  Potassium replaced. Chest x-ray shows progression in multifocal pneumonia.  Patient afebrile.  Currently on daptomycin and cefepime.  WBC 6.4.  Per RN, patient started requiring additional oxygen after accidentally removed his nasal cannula and was also noted to have had a 16 beat of V. tach.  At bedside assessment, patient is alert to self.  He does not appear to be in distress at this time.  Denies chest pain, shortness of breath, nausea, vomiting.  No facial grimacing noted when completing abdominal exam.  Bowel sounds active.  Lung sounds clear and diminished bilaterally.  I was able to remove nonrebreather and noted that patient's O2 sat maintaining in the mid 90s.  Patient is now back on 6 L O2.  RN to continue titrating results.   Interventions/ Plan          Raenette Rover, DNP, [place]

## 2022-06-28 NOTE — Progress Notes (Signed)
Received pt from [facility] [id number] Dr. Rory Percy paged [phone] Dr. Rory Percy on screen [phone]   Pts wife states that she received a call from her husband at 2030 1/11 and at that time he seemed normal. Unable to determine true LNW/Recognition of symptoms from documentation in chart. Day shift bedside RN states that pt has been altered since her arrival but there were several notes of pt status changes throughout the night.   Pt to CT 0910

## 2022-06-28 NOTE — Anesthesia Preprocedure Evaluation (Signed)
Anesthesia Evaluation  Patient identified by MRN, date of birth, ID band Patient unresponsive    Reviewed: Allergy & Precautions, Patient's Chart, lab work & pertinent test results  Airway Mallampati: Intubated       Dental no notable dental hx.    Pulmonary pneumonia Plan for GA with plans to remain intubated for ICU team to bronch post-operatively     + decreased breath sounds      Cardiovascular hypertension, Pt. on medications Normal cardiovascular exam+ dysrhythmias Atrial Fibrillation  Rhythm:Regular     Neuro/Psych negative neurological ROS  negative psych ROS   GI/Hepatic negative GI ROS, Neg liver ROS,,,  Endo/Other  negative endocrine ROS    Renal/GU negative Renal ROS  negative genitourinary   Musculoskeletal  (+) Arthritis , Osteoarthritis,    Abdominal Normal abdominal exam  (+)   Peds  Hematology  (+) Blood dyscrasia, anemia   Anesthesia Other Findings   Reproductive/Obstetrics                             Anesthesia Physical Anesthesia Plan  ASA: 3  Anesthesia Plan: General   Post-op Pain Management:    Induction: Intravenous  PONV Risk Score and Plan: 2 and Ondansetron, Dexamethasone and Treatment may vary due to age or medical condition  Airway Management Planned: Oral ETT  Additional Equipment: None  Intra-op Plan:   Post-operative Plan: Post-operative intubation/ventilation  Informed Consent:      History available from chart only  Plan Discussed with: CRNA  Anesthesia Plan Comments: (Lab Results      Component                Value               Date                      WBC                      6.1                 06/28/2022                HGB                      11.3 (L)            06/28/2022                HCT                      35.6 (L)            06/28/2022                MCV                      94.4                06/28/2022                 PLT                      260                 06/28/2022             Lab Results  Component                Value               Date                      NA                       137                 06/28/2022                K                        3.2 (L)             06/28/2022                CO2                      21 (L)              06/28/2022                GLUCOSE                  137 (H)             06/28/2022                BUN                      27 (H)              06/28/2022                CREATININE               1.03                06/28/2022                CALCIUM                  8.1 (L)             06/28/2022                GFRNONAA                 >60                 06/28/2022           )       Anesthesia Quick Evaluation

## 2022-06-28 NOTE — Consult Note (Signed)
Reason for Consult: Complex right hip problems with recent resection arthroplasty with recent presentation with anemia and persistent bloody drainage from his right hip Hemovac wound. Referring Physician: Pokhrel,MD  Travis Wolfe is an 88 y.o. male.  HPI: Very pleasant 88 year old male well-known to me from surgical management of his right hip for the past 7 years or so.  The onset of his surgical complications in this right hip began with the onset of metallosis and subsequent revision surgery performed elsewhere.  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  I have operated on him multiple times with recurrent postoperative seroma.  Prior to our last procedure of a resection arthroplasty we had placed a wound VAC with the attempt to try to get substantial removal of fluid and to try to get his wound to heal from the inside out.  This was unsuccessful with ongoing drainage.  This ultimately led to the discussion with plans to resect his right hip and placement of an antibiotic spacer with the hopes that we would be able to stop the oozing as well as allow his right hip to heal up ultimately. After his last procedure he was discharged to nursing facility.  He presented to the emergency room recently due to weakness and concerns for edema as well as laboratory studies that indicate anemia. He was seen and evaluated in the emergency room.  He has significant pain with any attempted movement of his right lower extremity. He was also noted to have significant edema throughout his body consistent with an anasarca type presentation. We were consulted as expected based on our long-term management history with Mr. Travis Wolfe to participate in his care and hope to provide as much assistance as possible.  Past Medical History:  Diagnosis Date   A-fib ([place])    REPORTS WAS DUE TO DEHYDRATION 3 YEARS AGO BUT WAS GIVEN FLUIDS IN ED  AND MEDS TO SLOW HR AND DROVE HIMSELF HOME  ; HAD ABLATION X2 WITH CARDIO DR. AKBARY AS PRECAUTION  ; has been on eliquis since but reports he has not taken it for 5 weeks b/c he was told to hold it for the multiple irrigations and debridements for the hip infection. reports his cardiologist is aware he has been holding it;    Arthritis    Atherosclerosis of both carotid arteries    BPH (benign prostatic hyperplasia)    Dysrhythmia    a-fib   Fall    fell on friday 01-16-18;   lost balAnce whIile tryng to get his sock on ;sustained skin tear to left wrist ; no drainage  , scabbed over , and has been covering with bandage    Heart murmur    SINCE HIS 20s   HLD (hyperlipidemia)    Hypertension    Non-rheumatic mitral regurgitation     Past Surgical History:  Procedure Laterality Date   BACK SURGERY  2007   L1-L5 LUMBAR WITH HARDWARE PLACED   CARDIOVERSION     CATARACT EXTRACTION, BILATERAL  2015   CRYOABLATION  2023   CARDIAC ABLATION and 2019   DACROCYSTORHINOSTOMY  07/26/2011   EXCISIONAL TOTAL HIP ARTHROPLASTY WITH ANTIBIOTIC SPACERS Right 06/06/2022   Procedure: EXCISIONAL TOTAL HIP ARTHROPLASTY WITH ANTIBIOTIC SPACERS;  Surgeon: Paralee Cancel, MD;  Location: [facility];  Service: Orthopedics;  Laterality: Right;   HEMATOMA EVACUATION Right 01/31/2022   Procedure: Evacuation of right hip hematoma;  Surgeon: Paralee Cancel, MD;  Location: [facility];  Service: Orthopedics;  Laterality: Right;  90 mins   HEMATOMA EVACUATION Right 05/02/2022   Procedure: Evacuation of right hip Seroma;  Surgeon: Durene Romans, MD;  Location: [facility];  Service: Orthopedics;  Laterality: Right;   HERNIA REPAIR Bilateral    hx of echocardiogram      INCISION AND DRAINAGE HIP Right 01/26/2018   Procedure: Right hip irrigation and debridement, excisional and non excisional debridement, head liner exchange, posterior approach;  Surgeon: Durene Romans, MD;  Location: [facility];  Service: Orthopedics;  Laterality: Right;  90  mins Would like to start earlier around 2:00pm if time opens   INCISION AND DRAINAGE HIP Right 11/26/2018   Procedure: IRRIGATION AND DEBRIDEMENT HIP;  Surgeon: Durene Romans, MD;  Location: [facility];  Service: Orthopedics;  Laterality: Right;  90 mins   INCISION AND DRAINAGE HIP Right 05/17/2021   Procedure: IRRIGATION AND DEBRIDEMENT RIGHT HIP, EVACUATION OF PSEUDOTUMOR;  Surgeon: Durene Romans, MD;  Location: [facility];  Service: Orthopedics;  Laterality: Right;   INGUINAL HERNIA REPAIR Bilateral    with mesh    IR US GUIDE BX ASP/DRAIN  12/17/2017   IRRIGATION AND ASPIRATION RIGHT HIP   12/10/2017   [facility]  x2, [facility] x1    KIDNEY STONE SURGERY     lithotripsy    KYPHOPLASTY     T7   LATERAL FUSION LUMBAR SPINE     L1-L5   PULMONARY VEIN ISOLATION AND LEFT ATRIAL ROOFLINE ABLATION   10/15/2017   DR Rudolpho Sevin    ROTATOR CUFF REPAIR Left    SHOULDER DEBRIDEMENT Right    SHOULDER OPEN ROTATOR CUFF REPAIR Left    SHOULDER SURGERY  1988   arthroscopy     TONSILLECTOMY     TOTAL HIP ARTHROPLASTY Right 2003   TOTAL HIP ARTHROPLASTY  2010   TOTAL HIP REVISION      05-2017, 10-2016    Family History  Problem Relation Age of Onset   Hypertension Mother    Cancer Mother     Social History:  reports that he has never smoked. He has never used smokeless tobacco. He reports current alcohol use of about 3.0 standard drinks of alcohol per week. He reports that he does not use drugs.  Allergies:  Allergies  Allergen Reactions   Tape Dermatitis and Rash    ONLY use Paper tape   Other     Cat dander   Doxycycline Rash   Horse Epithelium Rash    Medications: I have reviewed the patient's current medications. Scheduled:  baclofen  10 mg Oral TID   Chlorhexidine Gluconate Cloth  6 each Topical Daily   cholecalciferol  2,000 Units Oral Daily   dorzolamide-timolol  1 drop Both Eyes BID   feeding supplement  237 mL Oral BID BM   furosemide  20 mg Intravenous Daily   guaiFENesin  600 mg  Oral BID   leptospermum manuka honey  1 Application Topical Daily   multivitamin with minerals  1 tablet Oral Daily   potassium chloride  40 mEq Oral Daily   ranolazine  500 mg Oral BID   sodium chloride flush  10-40 mL Intracatheter Q12H   zinc sulfate  220 mg Oral Daily    Results for orders placed or performed during the hospital encounter of 06/25/22 (from the past 24 hour(s))  Magnesium     Status: None   Collection Time: 06/28/22  1:55 AM  Result Value Ref Range  Magnesium 1.9 1.7 - 2.4 mg/dL  Basic metabolic panel     Status: Abnormal   Collection Time: 06/28/22  1:55 AM  Result Value Ref Range   Sodium 137 135 - 145 mmol/L   Potassium 3.2 (L) 3.5 - 5.1 mmol/L   Chloride 106 98 - 111 mmol/L   CO2 21 (L) 22 - 32 mmol/L   Glucose, Bld 137 (H) 70 - 99 mg/dL   BUN 27 (H) 8 - 23 mg/dL   Creatinine, Ser 4.09 0.61 - 1.24 mg/dL   Calcium 8.1 (L) 8.9 - 10.3 mg/dL   GFR, Estimated >73 >53 mL/min   Anion gap 10 5 - 15  CBC     Status: Abnormal   Collection Time: 06/28/22  1:55 AM  Result Value Ref Range   WBC 6.4 4.0 - 10.5 K/uL   RBC 3.13 (L) 4.22 - 5.81 MIL/uL   Hemoglobin 9.6 (L) 13.0 - 17.0 g/dL   HCT 29.9 (L) 24.2 - 68.3 %   MCV 94.9 80.0 - 100.0 fL   MCH 30.7 26.0 - 34.0 pg   MCHC 32.3 30.0 - 36.0 g/dL   RDW 41.9 (H) 62.2 - 29.7 %   Platelets 224 150 - 400 K/uL   nRBC 0.3 (H) 0.0 - 0.2 %     X-ray: CLINICAL DATA:  Post right hip surgery involving incision of total hip arthroplasty with antibiotic spacer placement.   EXAM: PORTABLE PELVIS 1-2 VIEWS   COMPARISON:  06/09/2022   FINDINGS: Examination demonstrates evidence of the antibiotic spacer over the right hip region unchanged. Surgical drain adjacent the proximal right femur unchanged. There is a rounded object with 3 spring like metallic structures projected over the medial aspect of the proximal right thigh of uncertain clinical significance. There is diffuse decreased bone mineralization. Fusion  hardware over the lumbosacral spine and pelvis is intact and unchanged. Remainder of the exam is unchanged.   IMPRESSION: Stable postsurgical changes compatible recent right hip arthroplasty removal and placement of antibiotic spacer.     Electronically Signed   By: Elberta Fortis M.D.  ROS: As per his presenting history and physical as well as my notation above.  Blood pressure 104/62, pulse 87, temperature 98 F (36.7 C), temperature source Oral, resp. rate 20, height 5\' 8"  (1.727 m), weight 72.6 kg, SpO2 96 %.  Physical Exam: Body mass index is 24.3 kg/m.  General: Alert awake and Communicative.  In mild distress due to pain.  Elderly male, appears deconditioned and weak. HENT:   Mild pallor noted, oral mucosa is moist.  Chest:  Clear breath sounds.  Diminished breath sounds bilaterally. No crackles or wheezes.  CVS: S1 &S2 heard. No murmur.  Regular rate and rhythm. Abdomen: Soft, nontender, nondistended.  Bowel sounds are heard.  Abdominal wall edema Extremities: No cyanosis, clubbing, lower extremity edema.  Peripheral pulses are palpable.  Right hip with dressing wound VAC. Psych: Alert, awake and oriented, mildly anxious CNS:  No cranial nerve deficits.  Generalized weakness noted Skin: Warm and dry.   Right hip exam: Hemovac drain still present with output.  No signs of infection, no erythema or warmth Pain with any movement resulting in spasming of his right lower extremity. He has noted to have upper and lower extremity edema.  Assessment/Plan: 1.  Complex surgical history involving his right hip with recent resection arthroplasty with placement of antibiotic spacer with persistent wound drainage as well as significant pain with movement. 2.  Concerns for significant protein  malnutrition with resultant third space fluid retention, anasarca.  This would also be a significant source of inability for him to heal his right hip.  Plan: My initial concerns are obviously  focused around his right hip.  Based on the ongoing reports of drainage from his right hip I have been thinking that he would need a repeat surgical procedure for repeat I&D.  Additionally, based on the significant mount of pain he is having with any movement of his right lower extremity I feel that removing his antibiotic spacer at this point may provide some benefit.  I have discussed with him and still feel that reimplantation of a hip at this point would not be in his best interest particularly given his overall health and lack of healing and ongoing drainage issues.  I am uncertain if I will place any further antibiotic based product in his hip but I could consider using some antibiotic beads.  Nonetheless I will plan on taking him to the operating room likely on Saturday, January 13 for repeat I&D and removal of antibiotic spacer. However upon my initial evaluation in the emergency room and identified his overall appearance as well as the edema in his upper and lower extremities I feel it is critical that he be evaluated for his overall protein and albumin levels.  Speaking with his wife there is very little food intake despite her trying to get some protein shakes into his system. Though I will be focusing on his right hip and try to get this to heal up will need to work on his overall nutrition and health to try to optimize his opportunities for improved medical condition. I need to make certain that his pain control is improved by removing this antibiotic spacer so we can work on his mobility as his decreased mobility will lead to significant and rapid deconditioned state in addition to his overall protein malnutrition.  Mauri Pole 06/28/2022, 7:05 AM

## 2022-06-28 NOTE — Progress Notes (Signed)
PHARMACY NOTE:  ANTIMICROBIAL RENAL DOSAGE ADJUSTMENT  Current antimicrobial regimen includes a mismatch between antimicrobial dosage and estimated renal function.  As per policy approved by the Pharmacy & Therapeutics and Medical Executive Committees, the antimicrobial dosage will be adjusted accordingly.  Current antimicrobial dosage:  Cefepime 2g IV q8h  Indication: PNA  Renal Function:  Estimated Creatinine Clearance: 50.7 mL/min (by C-G formula based on SCr of 1.03 mg/dL). []      On intermittent HD, scheduled: []      On CRRT    Antimicrobial dosage has been changed to:  2g IV q12h  Additional comments:   Thank you for allowing pharmacy to be a part of this patient's care.  Peggyann Juba, PharmD, BCPS Pharmacy: [phone] 06/28/2022 8:42 AM

## 2022-06-28 NOTE — Progress Notes (Signed)
Pharmacy Antibiotic Note  Travis Wolfe is a 88 y.o. 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  During this admission, he underwent excisional TKA with abx spacers on 06/06/22 and discharged on daptomycin and ceftriaxone with plan to treat through 07/19/22.  He presented back to the ED on 06/25/22 from SNF after he was found to have low hgb.  CXR on 06/25/22 showed findings with concern for multifocal PNA. 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   Today, 06/28/2022: - scr 1.03 (crcl~50) - wbc wnl - Tmax 99.3  Plan: -Vancomycin 1250 mg IV q24h for est AUC 511 - zosyn 3.375 gm IV q8h (infuse over 4 hrs)   _________________________________________ Height: 5\' 8"  (172.7 cm) Weight: 72.6 kg (160 lb 0.9 oz) IBW/kg (Calculated) : 68.4  Temp (24hrs), Avg:98.6 F (37 C), Min:98 F (36.7 C), Max:99.3 F (37.4 C)  Recent Labs  Lab 06/25/22 0957 06/26/22 0415 06/27/22 0552 06/28/22 0155 06/28/22 1200  WBC 7.5 8.1 8.7 6.4 6.1  CREATININE 1.06 0.85 0.84 1.03  --      Estimated Creatinine Clearance: 50.7 mL/min (by C-G formula based on SCr of 1.03 mg/dL).    Allergies  Allergen Reactions   Tape Dermatitis and Rash    ONLY use Paper tape   Other     Cat dander   Doxycycline Rash   Horse Epithelium Rash     Thank you for allowing pharmacy to be a part of this patient's care.  Lynelle Doctor, PharmD, BCPS Clinical Pharmacist 06/28/2022 12:57 PM

## 2022-06-28 NOTE — Progress Notes (Incomplete)
eLink Physician-Brief Progress Note Patient Name: Travis Wolfe DOB: August 02, 1934 MRN: [id number]   Date of Service  06/28/2022  HPI/Events of Note  88 yr old male admitted with symptomatic anemia, multifocal pna, chronic hip infx following arthroplasty in the past, complained of chest pain tonight.  EKG nonspecific but did show t wave inversion.  Trop 452.  Sys bp 100'2.  Maintaining O2 sat on 15L [place].  Not c/o chest pain now but wife states occasionally rubbing chest.  Suspect may have angina induced by stress of illness in setting of CAD.  Do not feel he has ACS.  Echo with preserved ef 1/10.  eICU Interventions       Intervention Category Major Interventions: Other:  Mauri [place], Mamie Nick 06/28/2022, 11:57 PM

## 2022-06-28 NOTE — Evaluation (Signed)
SLP Cancellation Note  Patient Details Name: Travis Wolfe MRN: [id number] DOB: Sep 04, 1934   Cancelled treatment:       Reason Eval/Treat Not Completed: Other (comment) ("Code Stroke" called, Brain CT negative, pt in ICU with respiratory issues,  will continue efforts - messaged Rn to seek information when pt may be ready for swallow eval. Thanks)   [name] 06/28/2022, 11:55 AM   [name], MS [place] Office [phone] Pager [phone]

## 2022-06-29 ENCOUNTER — Inpatient Hospital Stay (HOSPITAL_COMMUNITY): Payer: Medicare PPO

## 2022-06-29 ENCOUNTER — Encounter (HOSPITAL_COMMUNITY)
Admission: EM | Disposition: A | Discharge status: Hospice | Payer: Self-pay | Source: Skilled Nursing Facility | Attending: Pulmonary Disease

## 2022-06-29 ENCOUNTER — Inpatient Hospital Stay (HOSPITAL_COMMUNITY): Payer: Medicare PPO | Admitting: Anesthesiology

## 2022-06-29 ENCOUNTER — Inpatient Hospital Stay: Payer: Self-pay

## 2022-06-29 DIAGNOSIS — I10 Essential (primary) hypertension: Secondary | ICD-10-CM

## 2022-06-29 DIAGNOSIS — D649 Anemia, unspecified: Secondary | ICD-10-CM | POA: Diagnosis not present

## 2022-06-29 DIAGNOSIS — T8484XA Pain due to internal orthopedic prosthetic devices, implants and grafts, initial encounter: Secondary | ICD-10-CM

## 2022-06-29 DIAGNOSIS — Z96641 Presence of right artificial hip joint: Secondary | ICD-10-CM | POA: Diagnosis not present

## 2022-06-29 DIAGNOSIS — I4891 Unspecified atrial fibrillation: Secondary | ICD-10-CM | POA: Diagnosis not present

## 2022-06-29 HISTORY — PX: TOTAL HIP REVISION: SHX763

## 2022-06-29 LAB — LACTIC ACID, PLASMA
Lactic Acid, Venous: 2.9 mmol/L (ref 0.5–1.9)
Lactic Acid, Venous: 2.5 mmol/L (ref 0.5–1.9)

## 2022-06-29 LAB — COMPREHENSIVE METABOLIC PANEL
ALT: 30 U/L (ref 0–44)
ALT: 28 U/L (ref 0–44)
AST: 44 U/L — ABNORMAL HIGH (ref 15–41)
AST: 49 U/L — ABNORMAL HIGH (ref 15–41)
Albumin: 1.7 g/dL — ABNORMAL LOW (ref 3.5–5.0)
Albumin: 2.4 g/dL — ABNORMAL LOW (ref 3.5–5.0)
Alkaline Phosphatase: 69 U/L (ref 38–126)
Alkaline Phosphatase: 80 U/L (ref 38–126)
Anion gap: 9 (ref 5–15)
Anion gap: 12 (ref 5–15)
BUN: 41 mg/dL — ABNORMAL HIGH (ref 8–23)
BUN: 46 mg/dL — ABNORMAL HIGH (ref 8–23)
CO2: 23 mmol/L (ref 22–32)
CO2: 21 mmol/L — ABNORMAL LOW (ref 22–32)
Calcium: 8.2 mg/dL — ABNORMAL LOW (ref 8.9–10.3)
Calcium: 8.7 mg/dL — ABNORMAL LOW (ref 8.9–10.3)
Chloride: 108 mmol/L (ref 98–111)
Chloride: 107 mmol/L (ref 98–111)
Creatinine, Ser: 1.26 mg/dL — ABNORMAL HIGH (ref 0.61–1.24)
Creatinine, Ser: 1.48 mg/dL — ABNORMAL HIGH (ref 0.61–1.24)
GFR, Estimated: 56 mL/min — ABNORMAL LOW (ref >60)
GFR, Estimated: 46 mL/min — ABNORMAL LOW (ref >60)
Glucose, Bld: 167 mg/dL — ABNORMAL HIGH (ref 70–99)
Glucose, Bld: 198 mg/dL — ABNORMAL HIGH (ref 70–99)
Potassium: 3.9 mmol/L (ref 3.5–5.1)
Potassium: 4.6 mmol/L (ref 3.5–5.1)
Sodium: 140 mmol/L (ref 135–145)
Sodium: 140 mmol/L (ref 135–145)
Total Bilirubin: 1.4 mg/dL — ABNORMAL HIGH (ref 0.3–1.2)
Total Bilirubin: 1.1 mg/dL (ref 0.3–1.2)
Total Protein: 5.2 g/dL — ABNORMAL LOW (ref 6.5–8.1)
Total Protein: 6.1 g/dL — ABNORMAL LOW (ref 6.5–8.1)

## 2022-06-29 LAB — BLOOD GAS, ARTERIAL
Acid-Base Excess: 0.6 mmol/L (ref 0.0–2.0)
Bicarbonate: 27.4 mmol/L (ref 20.0–28.0)
Drawn by: 270211
FIO2: 100 %
MECHVT: 410 mL
O2 Saturation: 98.9 %
PEEP: 5 cmH2O
Patient temperature: 37
RATE: 22 resp/min
pCO2 arterial: 52 mmHg — ABNORMAL HIGH (ref 32–48)
pH, Arterial: 7.33 — ABNORMAL LOW (ref 7.35–7.45)
pO2, Arterial: 275 mmHg — ABNORMAL HIGH (ref 83–108)

## 2022-06-29 LAB — RESPIRATORY PANEL BY PCR

## 2022-06-29 LAB — GLUCOSE, CAPILLARY
Glucose-Capillary: 167 mg/dL — ABNORMAL HIGH (ref 70–99)
Glucose-Capillary: 168 mg/dL — ABNORMAL HIGH (ref 70–99)
Glucose-Capillary: 173 mg/dL — ABNORMAL HIGH (ref 70–99)
Glucose-Capillary: 178 mg/dL — ABNORMAL HIGH (ref 70–99)
Glucose-Capillary: 182 mg/dL — ABNORMAL HIGH (ref 70–99)
Glucose-Capillary: 218 mg/dL — ABNORMAL HIGH (ref 70–99)

## 2022-06-29 LAB — CBC WITH DIFFERENTIAL/PLATELET
Abs Immature Granulocytes: 0.05 K/uL (ref 0.00–0.07)
Basophils Absolute: 0 K/uL (ref 0.0–0.1)
Basophils Relative: 0 %
Eosinophils Absolute: 0 K/uL (ref 0.0–0.5)
Eosinophils Relative: 0 %
HCT: 31.2 % — ABNORMAL LOW (ref 39.0–52.0)
Hemoglobin: 9.6 g/dL — ABNORMAL LOW (ref 13.0–17.0)
Immature Granulocytes: 1 %
Lymphocytes Relative: 6 %
Lymphs Abs: 0.5 K/uL — ABNORMAL LOW (ref 0.7–4.0)
MCH: 29.6 pg (ref 26.0–34.0)
MCHC: 30.8 g/dL (ref 30.0–36.0)
MCV: 96.3 fL (ref 80.0–100.0)
Monocytes Absolute: 0.3 K/uL (ref 0.1–1.0)
Monocytes Relative: 4 %
Neutro Abs: 6.2 K/uL (ref 1.7–7.7)
Neutrophils Relative %: 89 %
Platelets: 256 K/uL (ref 150–400)
RBC: 3.24 MIL/uL — ABNORMAL LOW (ref 4.22–5.81)
RDW: 21.3 % — ABNORMAL HIGH (ref 11.5–15.5)
WBC: 7 K/uL (ref 4.0–10.5)
nRBC: 0 % (ref 0.0–0.2)

## 2022-06-29 LAB — COOXEMETRY PANEL
Carboxyhemoglobin: 2.4 % — ABNORMAL HIGH (ref 0.5–1.5)
Methemoglobin: <0.7 % (ref 0.0–1.5)
O2 Saturation: 85.3 %
Total hemoglobin: 9.2 g/dL — ABNORMAL LOW (ref 12.0–16.0)

## 2022-06-29 LAB — BODY FLUID CELL COUNT WITH DIFFERENTIAL
Eos, Fluid: 0 %
Lymphs, Fluid: 6 %
Monocyte-Macrophage-Serous Fluid: 9 % — ABNORMAL LOW (ref 50–90)
Neutrophil Count, Fluid: 85 % — ABNORMAL HIGH (ref 0–25)
Total Nucleated Cell Count, Fluid: 405 cu mm (ref 0–1000)

## 2022-06-29 LAB — CORTISOL: Cortisol, Plasma: 16.2 ug/dL

## 2022-06-29 LAB — BRAIN NATRIURETIC PEPTIDE
B Natriuretic Peptide: 866.1 pg/mL — ABNORMAL HIGH (ref 0.0–100.0)
B Natriuretic Peptide: 1156.5 pg/mL — ABNORMAL HIGH (ref 0.0–100.0)

## 2022-06-29 LAB — TROPONIN I (HIGH SENSITIVITY)
Troponin I (High Sensitivity): 418 ng/L (ref <18)
Troponin I (High Sensitivity): 451 ng/L (ref <18)
Troponin I (High Sensitivity): 553 ng/L (ref <18)

## 2022-06-29 LAB — PROCALCITONIN: Procalcitonin: 7.74 ng/mL

## 2022-06-29 SURGERY — TOTAL HIP REVISION
Anesthesia: General | Site: Hip | Laterality: Right

## 2022-06-29 MED ORDER — ORAL CARE MOUTH RINSE
15.0000 mL | OROMUCOSAL | Status: DC
  Administered 2022-06-29 – 2022-07-07 (×93): 15 mL via OROMUCOSAL

## 2022-06-29 MED ORDER — VANCOMYCIN HCL 1000 MG IV SOLR
INTRAVENOUS | Status: AC
  Filled 2022-06-29: qty 40

## 2022-06-29 MED ORDER — VASOPRESSIN 20 UNIT/ML IV SOLN
INTRAVENOUS | Status: AC
  Filled 2022-06-29: qty 1

## 2022-06-29 MED ORDER — DEXAMETHASONE SODIUM PHOSPHATE 10 MG/ML IJ SOLN
INTRAMUSCULAR | Status: DC | PRN
  Administered 2022-06-29: 10 mg via INTRAVENOUS

## 2022-06-29 MED ORDER — ASPIRIN 325 MG PO TABS
325.0000 mg | ORAL_TABLET | Freq: Once | ORAL | Status: AC
  Administered 2022-06-29: 325 mg
  Filled 2022-06-29: qty 1

## 2022-06-29 MED ORDER — FENTANYL CITRATE (PF) 100 MCG/2ML IJ SOLN
INTRAMUSCULAR | Status: DC | PRN
  Administered 2022-06-29: 25 ug via INTRAVENOUS

## 2022-06-29 MED ORDER — FENTANYL CITRATE PF 50 MCG/ML IJ SOSY
25.0000 ug | PREFILLED_SYRINGE | INTRAMUSCULAR | Status: DC | PRN
  Administered 2022-06-30: 50 ug via INTRAVENOUS
  Administered 2022-06-30 (×2): 100 ug via INTRAVENOUS
  Administered 2022-06-30: 50 ug via INTRAVENOUS
  Administered 2022-06-30 (×2): 100 ug via INTRAVENOUS
  Administered 2022-07-01 (×2): 50 ug via INTRAVENOUS
  Administered 2022-07-01: 100 ug via INTRAVENOUS
  Administered 2022-07-01 – 2022-07-02 (×4): 50 ug via INTRAVENOUS
  Administered 2022-07-02 – 2022-07-03 (×3): 100 ug via INTRAVENOUS
  Administered 2022-07-03 – 2022-07-07 (×4): 50 ug via INTRAVENOUS
  Filled 2022-06-29 (×2): qty 2
  Filled 2022-06-29: qty 1
  Filled 2022-06-29: qty 2
  Filled 2022-06-29 (×2): qty 1
  Filled 2022-06-29 (×2): qty 2
  Filled 2022-06-29 (×3): qty 1
  Filled 2022-06-29 (×3): qty 2
  Filled 2022-06-29: qty 1
  Filled 2022-06-29 (×3): qty 2
  Filled 2022-06-29: qty 1

## 2022-06-29 MED ORDER — TOBRAMYCIN SULFATE 1.2 G IJ SOLR
INTRAMUSCULAR | Status: DC | PRN
  Administered 2022-06-29: 2.4 g

## 2022-06-29 MED ORDER — SODIUM CHLORIDE 0.9% FLUSH: 10.0000 mL | INTRAVENOUS | Status: DC | PRN

## 2022-06-29 MED ORDER — SODIUM CHLORIDE 0.9 % IV SOLN: INTRAVENOUS | Status: DC | PRN

## 2022-06-29 MED ORDER — FENTANYL CITRATE PF 50 MCG/ML IJ SOSY: 25.0000 ug | PREFILLED_SYRINGE | INTRAMUSCULAR | Status: DC | PRN

## 2022-06-29 MED ORDER — VANCOMYCIN HCL 1000 MG IV SOLR
INTRAVENOUS | Status: AC
  Filled 2022-06-29: qty 20

## 2022-06-29 MED ORDER — ORAL CARE MOUTH RINSE: 15.0000 mL | OROMUCOSAL | Status: DC | PRN

## 2022-06-29 MED ORDER — SODIUM CHLORIDE 0.9 % IR SOLN
Status: DC | PRN
  Administered 2022-06-29: 6000 mL

## 2022-06-29 MED ORDER — FENTANYL CITRATE (PF) 100 MCG/2ML IJ SOLN
INTRAMUSCULAR | Status: AC
  Filled 2022-06-29: qty 2

## 2022-06-29 MED ORDER — ROCURONIUM BROMIDE 10 MG/ML (PF) SYRINGE
PREFILLED_SYRINGE | INTRAVENOUS | Status: DC | PRN
  Administered 2022-06-29 (×2): 50 mg via INTRAVENOUS

## 2022-06-29 MED ORDER — ALBUMIN HUMAN 25 % IV SOLN
25.0000 g | Freq: Once | INTRAVENOUS | Status: AC
  Administered 2022-06-29: 25 g via INTRAVENOUS
  Filled 2022-06-29: qty 100

## 2022-06-29 MED ORDER — LACTATED RINGERS IV BOLUS
500.0000 mL | Freq: Once | INTRAVENOUS | Status: AC
  Administered 2022-06-29: 500 mL via INTRAVENOUS

## 2022-06-29 MED ORDER — PIPERACILLIN-TAZOBACTAM 3.375 G IVPB
3.3750 g | Freq: Three times a day (TID) | INTRAVENOUS | Status: DC
  Administered 2022-06-30: 3.375 g via INTRAVENOUS
  Filled 2022-06-29: qty 50

## 2022-06-29 MED ORDER — MIDAZOLAM HCL 2 MG/2ML IJ SOLN
INTRAMUSCULAR | Status: AC
  Filled 2022-06-29: qty 2

## 2022-06-29 MED ORDER — SODIUM BICARBONATE 8.4 % IV SOLN
INTRAVENOUS | Status: AC
  Filled 2022-06-29: qty 50

## 2022-06-29 MED ORDER — HEPARIN (PORCINE) 25000 UT/250ML-% IV SOLN
1000.0000 [IU]/h | INTRAVENOUS | Status: DC
  Administered 2022-06-29: 1000 [IU]/h via INTRAVENOUS
  Filled 2022-06-29: qty 250

## 2022-06-29 MED ORDER — VANCOMYCIN HCL IN DEXTROSE 1-5 GM/200ML-% IV SOLN
1000.0000 mg | INTRAVENOUS | Status: DC
  Administered 2022-06-29: 1000 mg via INTRAVENOUS
  Filled 2022-06-29: qty 200

## 2022-06-29 MED ORDER — TRANEXAMIC ACID-NACL 1000-0.7 MG/100ML-% IV SOLN: 1000.0000 mg | INTRAVENOUS | Status: AC

## 2022-06-29 MED ORDER — LACTATED RINGERS IV SOLN: INTRAVENOUS | Status: DC | PRN

## 2022-06-29 MED ORDER — DOCUSATE SODIUM 50 MG/5ML PO LIQD
100.0000 mg | Freq: Two times a day (BID) | ORAL | Status: DC
  Administered 2022-06-29 – 2022-07-08 (×9): 100 mg
  Filled 2022-06-29 (×9): qty 10

## 2022-06-29 MED ORDER — POLYETHYLENE GLYCOL 3350 17 G PO PACK
17.0000 g | PACK | Freq: Every day | ORAL | Status: DC
  Administered 2022-07-02 – 2022-07-08 (×2): 17 g
  Filled 2022-06-29 (×4): qty 1

## 2022-06-29 MED ORDER — PHENYLEPHRINE 80 MCG/ML (10ML) SYRINGE FOR IV PUSH (FOR BLOOD PRESSURE SUPPORT)
PREFILLED_SYRINGE | INTRAVENOUS | Status: DC | PRN
  Administered 2022-06-29: 160 ug via INTRAVENOUS

## 2022-06-29 MED ORDER — PHENYLEPHRINE 80 MCG/ML (10ML) SYRINGE FOR IV PUSH (FOR BLOOD PRESSURE SUPPORT)
PREFILLED_SYRINGE | INTRAVENOUS | Status: AC
  Filled 2022-06-29: qty 10

## 2022-06-29 MED ORDER — FENTANYL CITRATE PF 50 MCG/ML IJ SOSY
50.0000 ug | PREFILLED_SYRINGE | Freq: Once | INTRAMUSCULAR | Status: AC
  Administered 2022-06-29: 50 ug via INTRAVENOUS
  Filled 2022-06-29: qty 1

## 2022-06-29 MED ORDER — TOBRAMYCIN SULFATE 1.2 G IJ SOLR
INTRAMUSCULAR | Status: AC
  Filled 2022-06-29: qty 2.4

## 2022-06-29 MED ORDER — SODIUM CHLORIDE 0.9% FLUSH
10.0000 mL | Freq: Two times a day (BID) | INTRAVENOUS | Status: DC
  Administered 2022-06-30 – 2022-07-08 (×15): 10 mL

## 2022-06-29 MED ORDER — ROCURONIUM BROMIDE 10 MG/ML (PF) SYRINGE
1.0000 mg/kg | PREFILLED_SYRINGE | Freq: Once | INTRAVENOUS | Status: AC
  Administered 2022-06-29: 70 mg via INTRAVENOUS
  Filled 2022-06-29: qty 10

## 2022-06-29 MED ORDER — ETOMIDATE 2 MG/ML IV SOLN
10.0000 mg | Freq: Once | INTRAVENOUS | Status: AC
  Administered 2022-06-29: 10 mg via INTRAVENOUS
  Filled 2022-06-29: qty 10

## 2022-06-29 MED ORDER — PROPOFOL 1000 MG/100ML IV EMUL
0.0000 ug/kg/min | INTRAVENOUS | Status: DC
  Administered 2022-06-29 (×2): 10 ug/kg/min via INTRAVENOUS
  Administered 2022-06-30: 5 ug/kg/min via INTRAVENOUS
  Administered 2022-07-01: 15 ug/kg/min via INTRAVENOUS
  Administered 2022-07-01: 20 ug/kg/min via INTRAVENOUS
  Administered 2022-07-02 (×2): 15 ug/kg/min via INTRAVENOUS
  Administered 2022-07-03 – 2022-07-04 (×3): 20 ug/kg/min via INTRAVENOUS
  Filled 2022-06-29 (×11): qty 100

## 2022-06-29 MED ORDER — MELATONIN 3 MG PO TABS
6.0000 mg | ORAL_TABLET | Freq: Every evening | ORAL | Status: DC | PRN
  Administered 2022-06-29: 6 mg
  Filled 2022-06-29: qty 2

## 2022-06-29 MED ORDER — NOREPINEPHRINE 4 MG/250ML-% IV SOLN
0.0000 ug/min | INTRAVENOUS | Status: DC
  Administered 2022-06-29: 9 ug/min via INTRAVENOUS
  Administered 2022-06-29: 8 ug/min via INTRAVENOUS
  Administered 2022-06-29: 2 ug/min via INTRAVENOUS
  Administered 2022-06-30: 3 ug/min via INTRAVENOUS
  Administered 2022-06-30: 8 ug/min via INTRAVENOUS
  Filled 2022-06-29 (×5): qty 250

## 2022-06-29 MED ORDER — MIDAZOLAM HCL 2 MG/2ML IJ SOLN
2.0000 mg | Freq: Once | INTRAMUSCULAR | Status: AC
  Administered 2022-06-29: 2 mg via INTRAVENOUS
  Filled 2022-06-29: qty 2

## 2022-06-29 MED ORDER — POVIDONE-IODINE 10 % EX SWAB: 2.0000 | Freq: Once | CUTANEOUS | Status: DC

## 2022-06-29 MED ORDER — VANCOMYCIN HCL 1000 MG IV SOLR
INTRAVENOUS | Status: DC | PRN
  Administered 2022-06-29: 2 g

## 2022-06-29 MED ORDER — ALBUMIN HUMAN 25 % IV SOLN
25.0000 g | Freq: Four times a day (QID) | INTRAVENOUS | Status: DC
  Filled 2022-06-29: qty 100

## 2022-06-29 MED ORDER — VASOPRESSIN 20 UNITS/100 ML INFUSION FOR SHOCK
0.0000 [IU]/min | INTRAVENOUS | Status: DC
  Administered 2022-06-29 – 2022-06-30 (×3): 0.03 [IU]/min via INTRAVENOUS
  Filled 2022-06-29 (×3): qty 100

## 2022-06-29 MED ORDER — TOBRAMYCIN SULFATE 1.2 G IJ SOLR
INTRAMUSCULAR | Status: AC
  Filled 2022-06-29: qty 1.2

## 2022-06-29 MED ORDER — CHLORHEXIDINE GLUCONATE 4 % EX LIQD: 60.0000 mL | Freq: Once | CUTANEOUS | Status: DC

## 2022-06-29 MED ORDER — ALBUMIN HUMAN 25 % IV SOLN
25.0000 g | Freq: Four times a day (QID) | INTRAVENOUS | Status: AC
  Administered 2022-06-29 – 2022-06-30 (×4): 25 g via INTRAVENOUS
  Filled 2022-06-29 (×4): qty 100

## 2022-06-29 SURGICAL SUPPLY — 54 items
BAG COUNTER SPONGE SURGICOUNT (BAG) IMPLANT
BAG DECANTER FOR FLEXI CONT (MISCELLANEOUS) ×1 IMPLANT
BAG ZIPLOCK 12X15 (MISCELLANEOUS) ×1 IMPLANT
BLADE SAW SGTL 18X1.27X75 (BLADE) IMPLANT
BLADE SAW SGTL 81X20 HD (BLADE) ×1 IMPLANT
BRUSH FEMORAL CANAL (MISCELLANEOUS) IMPLANT
COVER SURGICAL LIGHT HANDLE (MISCELLANEOUS) ×1 IMPLANT
DERMABOND ADVANCED .7 DNX12 (GAUZE/BANDAGES/DRESSINGS) ×1 IMPLANT
DRAPE INCISE IOBAN 85X60 (DRAPES) ×1 IMPLANT
DRAPE ORTHO SPLIT 77X108 STRL (DRAPES) ×2
DRAPE POUCH INSTRU U-SHP 10X18 (DRAPES) ×1 IMPLANT
DRAPE SURG 17X11 SM STRL (DRAPES) ×1 IMPLANT
DRAPE SURG ORHT 6 SPLT 77X108 (DRAPES) ×2 IMPLANT
DRAPE U-SHAPE 47X51 STRL (DRAPES) ×1 IMPLANT
DRSG AQUACEL AG ADV 3.5X 4 (GAUZE/BANDAGES/DRESSINGS) IMPLANT
DRSG AQUACEL AG ADV 3.5X10 (GAUZE/BANDAGES/DRESSINGS) IMPLANT
DRSG AQUACEL AG ADV 3.5X14 (GAUZE/BANDAGES/DRESSINGS) IMPLANT
DURAPREP 26ML APPLICATOR (WOUND CARE) ×1 IMPLANT
ELECT BLADE TIP CTD 4 INCH (ELECTRODE) ×1 IMPLANT
ELECT REM PT RETURN 15FT ADLT (MISCELLANEOUS) ×1 IMPLANT
EVACUATOR 1/8 PVC DRAIN (DRAIN) IMPLANT
FACESHIELD WRAPAROUND (MASK) ×4 IMPLANT
FACESHIELD WRAPAROUND OR TEAM (MASK) ×4 IMPLANT
GLOVE BIO SURGEON STRL SZ 6 (GLOVE) ×1 IMPLANT
GLOVE BIOGEL PI IND STRL 6.5 (GLOVE) ×1 IMPLANT
GLOVE BIOGEL PI IND STRL 7.5 (GLOVE) ×1 IMPLANT
GLOVE ORTHO TXT STRL SZ7.5 (GLOVE) ×2 IMPLANT
GOWN STRL REUS W/ TWL LRG LVL3 (GOWN DISPOSABLE) ×3 IMPLANT
GOWN STRL REUS W/TWL LRG LVL3 (GOWN DISPOSABLE) ×3
HANDPIECE INTERPULSE COAX TIP (DISPOSABLE) ×1
KIT BASIN OR (CUSTOM PROCEDURE TRAY) ×1 IMPLANT
KIT STIMULAN RAPID CURE  10CC (Orthopedic Implant) ×1 IMPLANT
KIT STIMULAN RAPID CURE 10CC (Orthopedic Implant) IMPLANT
KIT TURNOVER KIT A (KITS) IMPLANT
MANIFOLD NEPTUNE II (INSTRUMENTS) ×1 IMPLANT
NDL SAFETY ECLIP 18X1.5 (MISCELLANEOUS) ×1 IMPLANT
NS IRRIG 1000ML POUR BTL (IV SOLUTION) ×1 IMPLANT
PACK TOTAL JOINT (CUSTOM PROCEDURE TRAY) ×1 IMPLANT
PROTECTOR NERVE ULNAR (MISCELLANEOUS) ×1 IMPLANT
SET HNDPC FAN SPRY TIP SCT (DISPOSABLE) ×1 IMPLANT
SLEEVE SUCTION 125 (MISCELLANEOUS) ×1 IMPLANT
SOLUTION IRRIG SURGIPHOR (IV SOLUTION) IMPLANT
SOLUTION PRONTOSAN WOUND 350ML (IRRIGATION / IRRIGATOR) IMPLANT
STAPLER VISISTAT 35W (STAPLE) IMPLANT
SUCTION FRAZIER HANDLE 12FR (TUBING) ×1
SUCTION TUBE FRAZIER 12FR DISP (TUBING) ×1 IMPLANT
SUT STRATAFIX PDS+ 0 24IN (SUTURE) ×1 IMPLANT
SUT VIC AB 1 CT1 36 (SUTURE) ×1 IMPLANT
SUT VIC AB 2-0 CT1 27 (SUTURE) ×2
SUT VIC AB 2-0 CT1 TAPERPNT 27 (SUTURE) ×2 IMPLANT
TOWEL OR 17X26 10 PK STRL BLUE (TOWEL DISPOSABLE) ×2 IMPLANT
TRAY FOLEY MTR SLVR 16FR STAT (SET/KITS/TRAYS/PACK) ×1 IMPLANT
TUBE SUCTION HIGH CAP CLEAR NV (SUCTIONS) ×1 IMPLANT
WATER STERILE IRR 1000ML POUR (IV SOLUTION) ×2 IMPLANT

## 2022-06-29 NOTE — Interval H&P Note (Signed)
History and Physical Interval Note:  06/29/2022 10:47 AM  Travis Wolfe  has presented today for surgery, with the diagnosis of right hip pain.  The various methods of treatment have been discussed with the patient and family. After consideration of risks, benefits and other options for treatment, the patient has consented to  Procedure(s) with comments: TOTAL HIP REVISION posterior approach (Right) - posterior approach  antibiotic spacer as a surgical intervention.  The patient's history has been reviewed, patient examined, no change in status, stable for surgery.  I have reviewed the patient's chart and labs.  Questions were answered to the patient's satisfaction.     Mauri Pole

## 2022-06-29 NOTE — Consult Note (Addendum)
NAME:  Travis Wolfe, MRN:  [id number], DOB:  11-06-1934, LOS: 4 ADMISSION DATE:  06/25/2022, CONSULTATION DATE:  06/28/22 REFERRING MD:  Flora Lipps, MD CHIEF COMPLAINT:  respiratory failure   History of Present Illness:  Travis Wolfe is an 88 year old male with history of atrial fibrillation on eliquis, hypertension and complex surgical history of the right hip with recent arthroplasty resection with placement of antibiotic spacer and started on daptomycin and ceftriaxone 06/07/22, who was admitted 06/25/22 from SNF with weakness and drop in hemoglobin. He was started on antibiotics for multifocal pneumonia based on chest radiograph on admission.  06/28/21 code stroke was called due to altered mentation and inability to move his upper extremities along with facial asymmetry. CTA head was negative for stroke.   He was more alert and responsive at time of evaluation in the ICU. His wife was at the bedside.   Orthopedics was planning return to OR on 1/13 but has post-poned that procedure at this time.   Pertinent  Medical History   Atrial Fibrillation Hypertension Mitral regurgitation  Significant Hospital Events: Including procedures, antibiotic start and stop dates in addition to other pertinent events   1/9 admitted to [facility] for weakness and anemia. Concern for pneumonia.  1/12 transferred to ICU for code stroke and increasing O2 needs   Interim History / Subjective:   Patient in significant pain overnight due to right hip He is scheduled for OR at 8am to remove antibiotic spacer Chest radiograph without change from yesterday  He had chest pain over night. Troponin mildly elevated, with down trend. EKG with non-specific T wave changes.   Objective   Blood pressure (!) 101/43, pulse 76, temperature (!) 97.2 F (36.2 C), temperature source Axillary, resp. rate (!) 26, height 5\' 8"  (1.727 m), weight 72.6 kg, SpO2 90 %.    FiO2 (%):  [60 %] 60 %   Intake/Output Summary (Last  24 hours) at 06/29/2022 0700 Last data filed at 06/29/2022 0606 Gross per 24 hour  Intake 979.22 ml  Output 1300 ml  Net -320.78 ml   Filed Weights   06/25/22 0940 06/27/22 0500 06/28/22 0505  Weight: 67.1 kg 72.5 kg 72.6 kg    Examination: General: elderly male, mild distress, ill appearing HENT: AT/Gray, moist mucous membranes, sclera anicteric Lungs: course breath sounds, no wheezing Cardiovascular: rrr, no murmurs Abdomen: soft, non-tender, non-distended, BS+ Extremities: warm, trace edema Neuro: A&Ox3 but slow to respond, unable to move RLE due to hip issues,  GU: external foley   Echo 06/26/22 LV EF 25% RV systolic function and size are normal, but noted to have elevated pulmonary pressures of 55mmHg.   Labs Cr 1.26 Troponin 452-> 418  Resolved Hospital Problem list     Assessment & Plan:  Acute Encephalopathy In setting of toxic metabolic derangements with respiratory failure and possible sepsis. CTA head is negative for stroke. He has been on cefepime since admission.  - monitor neuro status closely - Neuro status improved since yesterday morning. - Neurology evaluated patient, recommend MRI when able. He was not able to lay flat yesterday due to pain - cefepime stopped 1/12 - stopped baclofen 1/12  Acute Hypoxemic Respiratory Failure In setting of progressive bilateral infiltrates. He has been treated with antibiotics with no response. He has been on daptomycin for > 2 weeks. 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  - stoped daptomycin 1/12 - continue solumedrol 40mg  BID, started 1/12 - HCAP coverage with zosyn, vanc and azithromycin -  Patient scheduled for surgery today, plan to leave intubated after ortho procedure to perform bronchoscopy. - f/u fungitell and urine legionella  Sepsis due to Pneumonia vs other source - check blood cultures, UA - Zosyn, vanc and azithro for antibiotic coverage - check lactic acid  S/p Right hip arthroplasty  resection with antibiotic spacer Right Hip Seroma - plan for OR today for removal of antibiotic spacer for comfort  Acute Kidney Injury Hypokalemia Hypomagnesemia - replete as needed - avoid nephrotoxic agents  Anasarca Moderate Protein Calorie Malnutrition - doboff placed, will resume tube feeds after surgery  Paroxysmal atrial fibrillation Hypertension - therapeutic anticoagulation on hold due to transfusion requirements initially - currently rate controlled   Best Practice (right click and "Reselect all SmartList Selections" daily)   Diet/type: NPO DVT prophylaxis: prophylactic heparin  GI prophylaxis: N/A Lines: Central line Foley:  N/A Code Status:  full code Last date of multidisciplinary goals of care discussion [Discussed with patient's wife at bedside 1/12]  Labs   CBC: Recent Labs  Lab 06/25/22 0957 06/25/22 1831 06/26/22 0415 06/27/22 0552 06/28/22 0155 06/28/22 1200 06/29/22 0418  WBC 7.5  --  8.1 8.7 6.4 6.1 7.0  NEUTROABS 6.5  --   --   --   --  5.5 6.2  HGB 5.9*   < > 9.2* 8.6* 9.6* 11.3* 9.6*  HCT 18.8*   < > 28.3* 26.8* 29.7* 35.6* 31.2*  MCV 102.7*  --  92.8 93.1 94.9 94.4 96.3  PLT 249  --  261 242 224 260 256   < > = values in this interval not displayed.    Basic Metabolic Panel: Recent Labs  Lab 06/26/22 0415 06/27/22 0552 06/28/22 0155 06/28/22 1430 06/29/22 0418  NA 135 139 137 137 140  K 3.8 3.3* 3.2* 4.4 3.9  CL 105 106 106 104 108  CO2 24 26 21* 24 23  GLUCOSE 97 109* 137* 136* 167*  BUN 27* 23 27* 36* 41*  CREATININE 0.85 0.84 1.03 0.88 1.26*  CALCIUM 7.9* 8.1* 8.1* 8.1* 8.2*  MG  --  2.0 1.9 3.0*  --   PHOS  --   --   --  3.8  --    GFR: Estimated Creatinine Clearance: 41.5 mL/min (A) (by C-G formula based on SCr of 1.26 mg/dL (H)). Recent Labs  Lab 06/27/22 0552 06/28/22 0155 06/28/22 1126 06/28/22 1147 06/28/22 1200 06/28/22 1426 06/29/22 0418  PROCALCITON  --   --  7.46  --   --   --   --   WBC 8.7 6.4   --   --  6.1  --  7.0  LATICACIDVEN  --   --   --  2.4*  --  2.4*  --     Liver Function Tests: Recent Labs  Lab 06/25/22 1837 06/26/22 0415 06/29/22 0418  AST  --  39 44*  ALT  --  22 30  ALKPHOS  --  68 69  BILITOT  --  1.2 1.4*  PROT  --  5.5* 5.2*  ALBUMIN 1.9* 1.7* 1.7*   No results for input(s): "LIPASE", "AMYLASE" in the last 168 hours. No results for input(s): "AMMONIA" in the last 168 hours.  ABG    Component Value Date/Time   PHART 7.45 06/28/2022 2211   PCO2ART 38 06/28/2022 2211   PO2ART 83 06/28/2022 2211   HCO3 26.4 06/28/2022 2211   TCO2 24 06/06/2022 1213   ACIDBASEDEF 3.0 (H) 06/06/2022 1213   O2SAT 95.2  06/28/2022 2211     Coagulation Profile: Recent Labs  Lab 06/25/22 0957  INR 1.7*    Cardiac Enzymes: No results for input(s): "CKTOTAL", "CKMB", "CKMBINDEX", "TROPONINI" in the last 168 hours.  HbA1C: No results found for: "HGBA1C"  CBG: Recent Labs  Lab 06/28/22 0844 06/28/22 1632 06/28/22 2014 06/28/22 2348 06/29/22 0352  GLUCAP 114* 140* 163* 157* 167*     Critical care time: 80 minutes    Freda Jackson, MD [facility] Office: [phone]   See Amion for personal pager PCCM on call pager [phone] until 7pm. Please call Elink 7p-7a. [phone]

## 2022-06-29 NOTE — Progress Notes (Signed)
Pharmacy Antibiotic Note  Travis Wolfe is a 88 y.o. 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  During this admission, he underwent excisional TKA with abx spacers on 06/06/22 and discharged on daptomycin and ceftriaxone with plan to treat through 07/19/22.  He presented back to the ED on 06/25/22 from SNF after he was found to have low hgb.  CXR on 06/25/22 showed findings with concern for multifocal PNA. 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   Today, 06/29/2022: - scr up 1.26 (crcl~41) - afeb - wbc wnl - plan to take pt to the OR today for hip revisopm  Plan: - adjust Vancomycin 1000 mg mg IV q24h for est AUC 490 - zosyn 3.375 gm IV q8h (infuse over 4 hrs) - scr daily while on vanc/zosyn  _________________________________________ Height: 5\' 8"  (172.7 cm) Weight: 72.6 kg (160 lb 0.9 oz) IBW/kg (Calculated) : 68.4  Temp (24hrs), Avg:97.5 F (36.4 C), Min:97.1 F (36.2 C), Max:97.9 F (36.6 C)  Recent Labs  Lab 06/26/22 0415 06/27/22 0552 06/28/22 0155 06/28/22 1147 06/28/22 1200 06/28/22 1426 06/28/22 1430 06/29/22 0418  WBC 8.1 8.7 6.4  --  6.1  --   --  7.0  CREATININE 0.85 0.84 1.03  --   --   --  0.88 1.26*  LATICACIDVEN  --   --   --  2.4*  --  2.4*  --   --      Estimated Creatinine Clearance: 41.5 mL/min (A) (by C-G formula based on SCr of 1.26 mg/dL (H)).    Allergies  Allergen Reactions   Tape Dermatitis and Rash    ONLY use Paper tape   Other     Cat dander   Doxycycline Rash   Horse Epithelium Rash     Thank you for allowing pharmacy to be a part of this patient's care.  Lynelle Doctor, PharmD, BCPS Clinical Pharmacist 06/29/2022 11:13 AM

## 2022-06-29 NOTE — Progress Notes (Signed)
Patient ID: Travis Wolfe, male   DOB: 11/07/1934, 87 y.o.   MRN: [id number]  Still in a lot of pain Stable but not with multiple issues, pulmonary, malnutrition  To OR today with plans to remove the antibiotic spacer and debride the hip again  Appreciate the coordinated multidisciplinary care with ICU 

## 2022-06-29 NOTE — Op Note (Unsigned)
NAME: Travis Wolfe, Travis Wolfe MEDICAL RECORD NO: [id number] ACCOUNT NO: [id number] DATE OF BIRTH: 01-19-1935 FACILITY: [facility] LOCATION: [facility] PHYSICIAN: Pietro Cassis. 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  PROCEDURE: 1.  Excisional and non-excisional debridement of right hip including sharp excision of the skin using a scalpel of about a 6 inch incision, sharp excisional debridement of nonviable tissue as well as debridement of nonviable tissue and bone deep within  the hip. 2.  Non-excisional debridement of the right hip with 6 liters of normal saline solution in addition to a bottle of Surgiphor and Betadine based solution as well as a bottle of the Prontosan antimicrobial solution. 3.  Removal of static antibiotic spacer. 4.  Girdlestone procedure.  SURGEON:  Pietro Cassis. Alvan Dame, MD  ASSISTANT:  Costella Hatcher, PA-C.  Note, Ms. Lu Duffel was present for the entirety of the case from preoperative positioning, perioperative management of the operative extremity, general facilitation of the case and primary wound closure.  ANESTHESIA:  General.  BLOOD LOSS:  About 50 mL  DRAINS:  One medium Hemovac drain was placed deep as well as into the extrafascial tissues.  The drain was not sewn in.  We did place vancomycin and tobramycin impregnated dissolvable beads into the hip including the femur.  INDICATIONS FOR THE PROCEDURE:  The patient is a very pleasant 88 year old male with longstanding complicated history involving his right hip.  Ultimate decisions were made through our clinical evaluation to resect his hip, which was performed  approximately 2 weeks ago with placement of antibiotic  spacer.  He presented to the emergency room a few days ago in significant amount of pain with a basic picture of failure to thrive.  He had noted to have significant third spacing type fluid  throughout his extremities and was found to be significantly protein malnourished.  He also was found to have pneumonia despite being on antibiotics for treatment of his hip.  He was transferred to the Intensive Care Unit with a change in his mental  status felt to be in a metabolic alkalotic state as well as with some encephalopathic changes.  Discussions with the Intensive Care Unit physicians as well as anesthesia lead to recommendations for intubation of the patient with plans to do a  bronchoscopy status post our procedure.  The risks and benefits were discussed and reviewed with his wife and consent was obtained for procedure as outlined above.  DESCRIPTION OF PROCEDURE:  The patient was brought to the operative theater.  Once adequate anesthesia was established as noted already intubated to the Intensive Care Unit, he was transferred back to the OR.  He is already on multiple antibiotics.  He  was positioned into the left lateral decubitus position with the right hip up.  The right lower extremity was cleaned and then prepped and draped in sterile fashion.  A timeout was performed identifying the patient, planned procedure, and extremity.  A  portion of his incision was used proximally to expose the hip.  There was some seroma type fluid in the subcutaneous tissue, but his fascia was intact.  I then opened the fascia and evacuated further seroma.  During this initial exposure, the sharp  debridement was carried out with a  scalpel including the skin and nonviable tissue as well as old sutures.  At this time, once we opened up the gluteal fascia and IT band a bit the old static spacer was removed.  I then debrided the acetabulum with a  curette removing this nonviable tissue as well as some bone in this area.  I  did move his femur and found that it was intact, indicating no evidence of any midshaft femur fracture, discontinuity of the femur.  The extended trochanteric osteotomy used for  resection of his component was intact, though not healed as to be expected only 2 weeks status post this procedure.  At this point, the hip was irrigated with first 3 liters of normal saline solution with pulse lavage.  I then used a bottle of the  Surgiphor and Betadine based solution, allowed to sit in the wound for 5 minutes and then evacuated this and then placed Prontosan solution into the wound.  Once this was done, we irrigated the hip with another 3 liters of normal saline solution.  While  the procedure was going on, on the back table we prepared dissolvable antibiotic impregnated beads with vancomycin and tobramycin.  Once this was set up and the wound was debrided we placed the beads into the femur as well as into the acetabular  void to allow for elution of antibiotics.  I then placed a medium Hemovac drain that exited the skin anteriorly.  A portion of the drain was placed deep and then a portion of the open holes were placed into the subcutaneous tissue.  At this point, the  iliotibial band and gluteal fascia were reapproximated first with interrupted Vicryl #1 suture and then a running suture to not include the drain.  Once this was done, the remainder of the wound was closed in layers with 2-0 Vicryl and a running Monocryl  stitch.  The wound was then cleaned, dried and dressed sterilely using surgical glue and Aquacel dressing.  The drain site was dressed separately.  Given the findings intraoperatively he will continue to be limited weightbearing on the right lower  extremity to allow for his bone to continue to heal.  Our hope is that with this procedure and removal of the static spacer that his pain level will be improved to allow for mobility, which will help his overall condition medically.  He will return to   the Intensive Care Unit intubated with plans for bronchoscopy and further management including assisting with his malnutrition state.   PUS D: 06/29/2022 10:58:03 am T: 06/29/2022 11:23:00 am  JOB: [id number]/ [id number]

## 2022-06-29 NOTE — Plan of Care (Signed)
Spoke with Dr Erin Fulling, PCCM. Patient will be transitioned to critical care service. Will sign off

## 2022-06-29 NOTE — H&P (View-Only) (Signed)
Patient ID: Travis Wolfe, male   DOB: 12-30-1934, 88 y.o.   MRN: [id number]  Still in a lot of pain Stable but not with multiple issues, pulmonary, malnutrition  To OR today with plans to remove the antibiotic spacer and debride the hip again  Appreciate the coordinated multidisciplinary care with ICU

## 2022-06-29 NOTE — Anesthesia Postprocedure Evaluation (Signed)
Anesthesia Post Note  Patient: Travis Wolfe  Procedure(s) Performed: TOTAL HIP REVISION posterior approach (Right: Hip)     Patient location during evaluation: SICU Anesthesia Type: General Level of consciousness: sedated Pain management: pain level controlled Vital Signs Assessment: post-procedure vital signs reviewed and stable Respiratory status: patient remains intubated per anesthesia plan Cardiovascular status: stable Postop Assessment: no apparent nausea or vomiting Anesthetic complications: no   No notable events documented.  Last Vitals:  Vitals:   06/29/22 0930 06/29/22 1204  BP: (!) 141/60   Pulse: 90   Resp: (!) 22   Temp:    SpO2: 98% 100%    Last Pain:  Vitals:   06/29/22 0900  TempSrc: Axillary  PainSc:                  Travis Wolfe

## 2022-06-29 NOTE — Progress Notes (Incomplete)
ANTICOAGULATION CONSULT NOTE   Pharmacy Consult for heparin  Indication: atrial fibrillation  Allergies  Allergen Reactions   Tape Dermatitis and Rash    ONLY use Paper tape   Other     Cat dander   Doxycycline Rash   Horse Epithelium Rash    Patient Measurements: Height: 5\' 8"  (172.7 cm) Weight: 72.6 kg (160 lb 0.9 oz) IBW/kg (Calculated) : 68.4 Heparin Dosing Weight: 72 kg  Vital Signs: Temp: 97.7 F (36.5 C) (01/13 1307) Temp Source: Axillary (01/13 1307) BP: 141/60 (01/13 0930) Pulse Rate: 90 (01/13 0930)  Labs: Recent Labs    06/28/22 0155 06/28/22 1200 06/28/22 1430 06/28/22 2214 06/28/22 2358 06/29/22 0418  HGB 9.6* 11.3*  --   --   --  9.6*  HCT 29.7* 35.6*  --   --   --  31.2*  PLT 224 260  --   --   --  256  CREATININE 1.03  --  0.88  --   --  1.26*  TROPONINIHS  --   --   --  452* 418*  --     Estimated Creatinine Clearance: 41.5 mL/min (A) (by C-G formula based on SCr of 1.26 mg/dL (H)).   Medications:  - on Eliquis 2.5 mg bid PTA  (last dose taken on 1/8)  Assessment: Patient is an 88 y.o M with hx afib on Eliquis PTA who presented to the ED from SNF after he was found to have low hgb. Eliquis held on admission d/t anemia and hematoma noted at his right surgical hip wound. He went to the OR on 1/13 AM for resection of THA. Pharmacy has been consulted to start heparin drip on 1/13 for afib.  Today, 06/29/2022: -  hgb 9.6, plts 256K - scr up 1.26 - per Dr. Erin Fulling via [facility] msg, can start heparin drip this afternoon once pt is back from brain MRI  Goal of Therapy:  Heparin level 0.3-0.7 units/ml Monitor platelets by anticoagulation protocol: Yes   Plan:  - heparin 1000 units/hr (no bolus) - check 8 hr heparin level   Cong Hightower P 06/29/2022,1:53 PM

## 2022-06-29 NOTE — Progress Notes (Signed)
ANTICOAGULATION CONSULT NOTE - Initial Consult  Pharmacy Consult for Heparin  Indication: atrial fibrillation  Allergies  Allergen Reactions   Tape Dermatitis and Rash    ONLY use Paper tape   Other     Cat dander   Doxycycline Rash   Horse Epithelium Rash    Patient Measurements: Height: 5\' 8"  (172.7 cm) Weight: 72.6 kg (160 lb 0.9 oz) IBW/kg (Calculated) : 68.4 Heparin Dosing Weight: 72 kg  Vital Signs: Temp: 97.7 F (36.5 C) (01/13 1307) Temp Source: Axillary (01/13 1307) BP: 141/60 (01/13 0930) Pulse Rate: 90 (01/13 0930)  Labs: Recent Labs    06/28/22 0155 06/28/22 1200 06/28/22 1430 06/28/22 2214 06/28/22 2358 06/29/22 0418 06/29/22 1342  HGB 9.6* 11.3*  --   --   --  9.6*  --   HCT 29.7* 35.6*  --   --   --  31.2*  --   PLT 224 260  --   --   --  256  --   CREATININE 1.03  --  0.88  --   --  1.26* 1.48*  TROPONINIHS  --   --   --  452* 418*  --   --     Estimated Creatinine Clearance: 35.3 mL/min (A) (by C-G formula based on SCr of 1.48 mg/dL (H)).   Medical History: Past Medical History:  Diagnosis Date   A-fib ([place])    REPORTS WAS DUE TO DEHYDRATION 3 YEARS AGO BUT WAS GIVEN FLUIDS IN ED AND MEDS TO SLOW HR AND DROVE HIMSELF HOME  ; HAD ABLATION X2 WITH CARDIO DR. AKBARY AS PRECAUTION  ; has been on eliquis since but reports he has not taken it for 5 weeks b/c he was told to hold it for the multiple irrigations and debridements for the hip infection. reports his cardiologist is aware he has been holding it;    Arthritis    Atherosclerosis of both carotid arteries    BPH (benign prostatic hyperplasia)    Dysrhythmia    a-fib   Fall    fell on friday 01-16-18;   lost balAnce whIile tryng to get his sock on ;sustained skin tear to left wrist ; no drainage  , scabbed over , and has been covering with bandage    Heart murmur    SINCE HIS 20s   HLD (hyperlipidemia)    Hypertension    Non-rheumatic mitral regurgitation     Medications:  Scheduled:    baclofen  10 mg Oral TID   Chlorhexidine Gluconate Cloth  6 each Topical Daily   cholecalciferol  2,000 Units Oral Daily   docusate  100 mg Per Tube BID   dorzolamide-timolol  1 drop Both Eyes BID   guaiFENesin  600 mg Oral BID   leptospermum manuka honey  1 Application Topical Daily   methylPREDNISolone (SOLU-MEDROL) injection  40 mg Intravenous Q12H   midazolam       multivitamin with minerals  1 tablet Oral Daily   polyethylene glycol  17 g Per Tube Daily   ranolazine  500 mg Oral BID   sodium chloride flush  10-40 mL Intracatheter Q12H   zinc sulfate  220 mg Oral Daily   Infusions:   sodium chloride     albumin human     azithromycin 500 mg (06/29/22 1240)   feeding supplement (VITAL 1.5 CAL) 1,000 mL (06/29/22 1243)   norepinephrine (LEVOPHED) Adult infusion 9 mcg/min (06/29/22 1518)   piperacillin-tazobactam (ZOSYN)  IV Stopped (06/29/22 1033)  propofol (DIPRIVAN) infusion 1 mcg/kg/min (06/29/22 1004)   vancomycin     vasopressin     PRN: Place/Maintain arterial line **AND** sodium chloride, acetaminophen **OR** acetaminophen, albuterol, fentaNYL (SUBLIMAZE) injection, fentaNYL (SUBLIMAZE) injection, HYDROmorphone (DILAUDID) injection, ipratropium-albuterol, loperamide, melatonin, midazolam, oxyCODONE, sodium chloride flush, valACYclovir  Medications:  - on Eliquis 2.5 mg bid PTA  (last dose taken on 1/8)   Assessment: Patient is an 88 y.o M with hx afib on Eliquis PTA who presented to the ED from SNF after he was found to have low hgb. Eliquis held on admission d/t anemia and hematoma noted at his right surgical hip wound. He went to the OR on 1/13 AM for resection of THA. Pharmacy has been consulted to start heparin drip on 1/13 for afib.   Today, 06/29/2022: - hgb 9.6, plts 256K - scr up 1.26 - per Dr. Fidela Cieslak Fulling via [facility] msg, can start heparin drip this afternoon once pt is back from brain MRI   Goal of Therapy:  Heparin level 0.3-0.7 units/ml Monitor platelets by  anticoagulation protocol: Yes   Plan:  - heparin 1000 units/hr (no bolus) - check 8 hr heparin level  - daily cbc, monitor for signs/symptoms of bleeding  Peggyann Juba, PharmD, BCPS Pharmacy: [phone] 06/29/2022,3:38 PM

## 2022-06-29 NOTE — Progress Notes (Signed)
Peripherally Inserted Central Catheter Placement  The IV Nurse has discussed with the patient and/or persons authorized to consent for the patient, the purpose of this procedure and the potential benefits and risks involved with this procedure.  The benefits include less needle sticks, lab draws from the catheter, and the patient may be discharged home with the catheter. Risks include, but not limited to, infection, bleeding, blood clot (thrombus formation), and puncture of an artery; nerve damage and irregular heartbeat and possibility to perform a PICC exchange if needed/ordered by physician.  Alternatives to this procedure were also discussed.  Bard Power PICC patient education guide, fact sheet on infection prevention and patient information card has been provided to patient /or left at bedside.  Telephone consent obtained from wife, Altha Harm.  PICC Placement Documentation  PICC Single Lumen 06/08/22 Right Brachial 36 cm 0 cm (Active)  Indication for Insertion or Continuance of Line Poor Vasculature-patient has had multiple peripheral attempts or PIVs lasting less than 24 hours 06/29/22 0800  Exposed Catheter (cm) 0 cm 06/12/22 0441  Site Assessment Clean, Dry, Intact 06/29/22 0800  Line Status Infusing;Flushed;Blood return noted 06/29/22 0800  Dressing Type Transparent;Securing device 06/29/22 0800  Dressing Status Antimicrobial disc in place;Clean, Dry, Intact 06/29/22 0800  Safety Lock Not Applicable 14/48/18 5631  Line Care Connections checked and tightened 06/29/22 0800  Line Adjustment (NICU/IV Team Only) No 06/12/22 0441  Dressing Intervention New dressing 06/08/22 1417  Dressing Change Due 07/01/22 06/28/22 0335     PICC Triple Lumen 06/29/22 Left Brachial 47 cm 0 cm (Active)  Indication for Insertion or Continuance of Line Vasoactive infusions 06/29/22 1750  Exposed Catheter (cm) 0 cm 06/29/22 1750  Site Assessment Clean, Dry, Intact 06/29/22 1750  Lumen #1 Status Flushed;Saline  locked;Blood return noted 06/29/22 1750  Lumen #2 Status Flushed;Saline locked;Blood return noted 06/29/22 1750  Lumen #3 Status Flushed;Saline locked;Blood return noted 06/29/22 1750  Dressing Type Transparent;Securing device 06/29/22 1750  Dressing Status Antimicrobial disc in place;Clean, Dry, Intact 06/29/22 1750  Safety Lock Not Applicable 49/70/26 3785  Line Care Connections checked and tightened 06/29/22 1750  Line Adjustment (NICU/IV Team Only) No 06/29/22 1750  Dressing Intervention New dressing 06/29/22 1750  Dressing Change Due 07/06/22 06/29/22 1750       Rolena Infante 06/29/2022, 5:52 PM

## 2022-06-29 NOTE — Procedures (Signed)
Bronchoscopy Procedure Note  IRWIN TORAN  [id number]  1935-02-11  Date:06/29/22  Time:1:22 PM   Provider Performing:Kamariyah Timberlake B Malichi Palardy   Procedure(s):  Flexible bronchoscopy with bronchial alveolar lavage (50932)  Indication(s) Pneumonia Respiratory Failure  Consent Risks of the procedure as well as the alternatives and risks of each were explained to the patient and/or caregiver.  Consent for the procedure was obtained and is signed in the bedside chart  Anesthesia General   Time Out Verified patient identification, verified procedure, site/side was marked, verified correct patient position, special equipment/implants available, medications/allergies/relevant history reviewed, required imaging and test results available.   Sterile Technique Usual hand hygiene, masks, gowns, and gloves were used   Procedure Description Bronchoscope advanced through endotracheal tube and into airway.  Airways were examined down to subsegmental level with findings noted below.   Following diagnostic evaluation, BAL(s) performed in RUL with normal saline and return of 67m fluid  Findings:  - Normal appearing airways - No significant secretion budern   Complications/Tolerance None; patient tolerated the procedure well. Chest X-ray is not needed post procedure.   EBL Minimal   Specimen(s) BAL specimens sent for cultures, cell count and cytology

## 2022-06-29 NOTE — Progress Notes (Signed)
PCCM Update:  MRI Brain does not show acute stroke or hemorrhage. Starting heparin drip for history of atrial fibrillation and empiric treatment of DVT/PE. Will monitor H/H closely along with right hip wound vac for increased output.  His renal function is climbing with poor urine output. Lactic acid is elevated and ScVo2 is 85%.  Will bolus 511m of LR at this time and continue boluses as needed Troponin remains flatt in the 400s. Given aspirin for NSTEMI and heparin drip as above. Will follow up bronchoscopy BAL cultures  JFreda Jackson MD [facility] Office: [phone]  See Amion for personal pager PCCM on call pager ([phone] 7pm. Please call Elink 7p-7a. [phone]

## 2022-06-29 NOTE — Brief Op Note (Signed)
06/25/2022 - 06/29/2022  10:47 AM  PATIENT:  Travis Wolfe  88 y.o. male  PRE-OPERATIVE DIAGNOSIS:  right hip pain status post resection arthroplasty with placement of statis antibiotic spacer with persistent significant pain  POST-OPERATIVE DIAGNOSIS:  right hip pain status post resection arthroplasty with placement of statis antibiotic spacer with persistent significant pain  PROCEDURE:  Procedure(s) with comments: TOTAL HIP REVISION posterior approach (Right) - posterior approach  antibiotic spacer   SURGEON:  Surgeon(s) and Role:    Paralee Cancel, MD - Primary  PHYSICIAN ASSISTANT: Costella Hatcher, PA-C  ANESTHESIA:   general  EBL:  50 mL   BLOOD ADMINISTERED:none  DRAINS: (1 medium) Hemovact drain(s) in the right hip with  Suction Open   LOCAL MEDICATIONS USED:  OTHER Vancomycin and tobramycin beads  SPECIMEN:  No Specimen  DISPOSITION OF SPECIMEN:  N/A  COUNTS:  YES  TOURNIQUET:  * No tourniquets in log *  DICTATION: .Other Dictation: Dictation Number [id number]  PLAN OF CARE: Admit to inpatient   PATIENT DISPOSITION:  ICU - intubated and hemodynamically stable.   Delay start of Pharmacological VTE agent (>24hrs) due to surgical blood loss or risk of bleeding: no

## 2022-06-29 NOTE — Progress Notes (Signed)
Contacted by staff to provide support to a very tearful wife. Met with her allowing time and space for her to share. She shares her hopes for his recovery and her faith that sustains her. 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 I was able to provide presence and support as well as prayer as I ended the visit.

## 2022-06-29 NOTE — Transfer of Care (Signed)
Immediate Anesthesia Transfer of Care Note  Patient: Travis Wolfe  Procedure(s) Performed: TOTAL HIP REVISION posterior approach (Right: Hip)  Patient Location: ICU  Anesthesia Type:General  Level of Consciousness: sedated and unresponsive  Airway & Oxygen Therapy: Patient remains intubated per anesthesia plan and Patient placed on Ventilator (see vital sign flow sheet for setting)  Post-op Assessment: Report given to RN  Post vital signs: Reviewed and stable  Last Vitals:  Vitals Value Taken Time  BP    Temp    Pulse 112 06/29/22 1116  Resp 22 06/29/22 1116  SpO2 92 % 06/29/22 1116  Vitals shown include unvalidated device data.  Last Pain:  Vitals:   06/29/22 0900  TempSrc: Axillary  PainSc:       Patients Stated Pain Goal: 2 (62/56/38 9373)  Complications: No notable events documented.

## 2022-06-29 NOTE — Procedures (Signed)
Intubation Procedure Note  Travis Wolfe  [id number]  1934/09/03  Date:06/29/22  Time:8:13 AM   Provider Performing:Laini Urick B Analya Louissaint    Procedure: Intubation (31500)  Indication(s) Respiratory Failure  Consent Risks of the procedure as well as the alternatives and risks of each were explained to the patient and/or caregiver.  Consent for the procedure was obtained and is signed in the bedside chart   Anesthesia Etomidate, Versed, Fentanyl, and Rocuronium   Time Out Verified patient identification, verified procedure, site/side was marked, verified correct patient position, special equipment/implants available, medications/allergies/relevant history reviewed, required imaging and test results available.   Sterile Technique Usual hand hygeine, masks, and gloves were used   Procedure Description Patient positioned in bed supine.  Sedation given as noted above.  Patient was intubated with endotracheal tube using Glidescope.  View was Grade 1 full glottis .  Number of attempts was 1.  Colorimetric CO2 detector was consistent with tracheal placement.   Complications/Tolerance None; patient tolerated the procedure well. Chest X-ray is ordered to verify placement.   EBL none   Specimen(s) None

## 2022-06-29 NOTE — Progress Notes (Signed)
Cpt held at this time. RN doing repeat EKG

## 2022-06-29 NOTE — Progress Notes (Signed)
Aline attempted x 2 unsuccessful md aware.

## 2022-06-30 ENCOUNTER — Inpatient Hospital Stay (HOSPITAL_COMMUNITY): Payer: Medicare PPO

## 2022-06-30 DIAGNOSIS — D649 Anemia, unspecified: Secondary | ICD-10-CM | POA: Diagnosis not present

## 2022-06-30 LAB — COMPREHENSIVE METABOLIC PANEL
ALT: 87 U/L — ABNORMAL HIGH (ref 0–44)
AST: 213 U/L — ABNORMAL HIGH (ref 15–41)
Albumin: 3.4 g/dL — ABNORMAL LOW (ref 3.5–5.0)
Alkaline Phosphatase: 62 U/L (ref 38–126)
Anion gap: 20 — ABNORMAL HIGH (ref 5–15)
BUN: 59 mg/dL — ABNORMAL HIGH (ref 8–23)
CO2: 17 mmol/L — ABNORMAL LOW (ref 22–32)
Calcium: 8.9 mg/dL (ref 8.9–10.3)
Chloride: 101 mmol/L (ref 98–111)
Creatinine, Ser: 2.29 mg/dL — ABNORMAL HIGH (ref 0.61–1.24)
GFR, Estimated: 27 mL/min — ABNORMAL LOW (ref >60)
Glucose, Bld: 196 mg/dL — ABNORMAL HIGH (ref 70–99)
Potassium: 5.4 mmol/L — ABNORMAL HIGH (ref 3.5–5.1)
Sodium: 138 mmol/L (ref 135–145)
Total Bilirubin: 1.6 mg/dL — ABNORMAL HIGH (ref 0.3–1.2)
Total Protein: 6.7 g/dL (ref 6.5–8.1)

## 2022-06-30 LAB — CBC
HCT: 27.2 % — ABNORMAL LOW (ref 39.0–52.0)
HCT: 27.4 % — ABNORMAL LOW (ref 39.0–52.0)
Hemoglobin: 7.9 g/dL — ABNORMAL LOW (ref 13.0–17.0)
Hemoglobin: 8.7 g/dL — ABNORMAL LOW (ref 13.0–17.0)
MCH: 29.7 pg (ref 26.0–34.0)
MCH: 29.5 pg (ref 26.0–34.0)
MCHC: 29 g/dL — ABNORMAL LOW (ref 30.0–36.0)
MCHC: 31.8 g/dL (ref 30.0–36.0)
MCV: 102.3 fL — ABNORMAL HIGH (ref 80.0–100.0)
MCV: 92.9 fL (ref 80.0–100.0)
Platelets: 307 10*3/uL (ref 150–400)
Platelets: 128 10*3/uL — ABNORMAL LOW (ref 150–400)
RBC: 2.66 MIL/uL — ABNORMAL LOW (ref 4.22–5.81)
RBC: 2.95 MIL/uL — ABNORMAL LOW (ref 4.22–5.81)
RDW: 20 % — ABNORMAL HIGH (ref 11.5–15.5)
RDW: 19.2 % — ABNORMAL HIGH (ref 11.5–15.5)
WBC: 12.7 10*3/uL — ABNORMAL HIGH (ref 4.0–10.5)
WBC: 9.6 10*3/uL (ref 4.0–10.5)
nRBC: 0.6 % — ABNORMAL HIGH (ref 0.0–0.2)
nRBC: 1 % — ABNORMAL HIGH (ref 0.0–0.2)

## 2022-06-30 LAB — BASIC METABOLIC PANEL
Anion gap: 17 — ABNORMAL HIGH (ref 5–15)
BUN: 67 mg/dL — ABNORMAL HIGH (ref 8–23)
CO2: 19 mmol/L — ABNORMAL LOW (ref 22–32)
Calcium: 8.3 mg/dL — ABNORMAL LOW (ref 8.9–10.3)
Chloride: 102 mmol/L (ref 98–111)
Creatinine, Ser: 2.49 mg/dL — ABNORMAL HIGH (ref 0.61–1.24)
GFR, Estimated: 25 mL/min — ABNORMAL LOW (ref >60)
Glucose, Bld: 232 mg/dL — ABNORMAL HIGH (ref 70–99)
Potassium: 4.6 mmol/L (ref 3.5–5.1)
Sodium: 138 mmol/L (ref 135–145)

## 2022-06-30 LAB — GLUCOSE, CAPILLARY
Glucose-Capillary: 153 mg/dL — ABNORMAL HIGH (ref 70–99)
Glucose-Capillary: 157 mg/dL — ABNORMAL HIGH (ref 70–99)
Glucose-Capillary: 170 mg/dL — ABNORMAL HIGH (ref 70–99)
Glucose-Capillary: 174 mg/dL — ABNORMAL HIGH (ref 70–99)
Glucose-Capillary: 246 mg/dL — ABNORMAL HIGH (ref 70–99)
Glucose-Capillary: 246 mg/dL — ABNORMAL HIGH (ref 70–99)

## 2022-06-30 LAB — COOXEMETRY PANEL
Carboxyhemoglobin: 2.7 % — ABNORMAL HIGH (ref 0.5–1.5)
Methemoglobin: <0.7 % (ref 0.0–1.5)
O2 Saturation: 100 %
Total hemoglobin: 8.1 g/dL — ABNORMAL LOW (ref 12.0–16.0)

## 2022-06-30 LAB — CBC WITH DIFFERENTIAL/PLATELET
Abs Immature Granulocytes: 0.17 10*3/uL — ABNORMAL HIGH (ref 0.00–0.07)
Basophils Absolute: 0 10*3/uL (ref 0.0–0.1)
Basophils Relative: 0 %
Eosinophils Absolute: 0 10*3/uL (ref 0.0–0.5)
Eosinophils Relative: 0 %
HCT: 28.9 % — ABNORMAL LOW (ref 39.0–52.0)
Hemoglobin: 9 g/dL — ABNORMAL LOW (ref 13.0–17.0)
Immature Granulocytes: 2 %
Lymphocytes Relative: 10 %
Lymphs Abs: 0.8 10*3/uL (ref 0.7–4.0)
MCH: 29.7 pg (ref 26.0–34.0)
MCHC: 31.1 g/dL (ref 30.0–36.0)
MCV: 95.4 fL (ref 80.0–100.0)
Monocytes Absolute: 0.4 10*3/uL (ref 0.1–1.0)
Monocytes Relative: 5 %
Neutro Abs: 7 10*3/uL (ref 1.7–7.7)
Neutrophils Relative %: 83 %
Platelets: 153 10*3/uL (ref 150–400)
RBC: 3.03 MIL/uL — ABNORMAL LOW (ref 4.22–5.81)
RDW: 19.2 % — ABNORMAL HIGH (ref 11.5–15.5)
WBC: 8.4 10*3/uL (ref 4.0–10.5)
nRBC: 1.8 % — ABNORMAL HIGH (ref 0.0–0.2)

## 2022-06-30 LAB — BLOOD GAS, ARTERIAL
Acid-base deficit: 12.8 mmol/L — ABNORMAL HIGH (ref 0.0–2.0)
Acid-base deficit: 8.5 mmol/L — ABNORMAL HIGH (ref 0.0–2.0)
Bicarbonate: 16.3 mmol/L — ABNORMAL LOW (ref 20.0–28.0)
Bicarbonate: 17.3 mmol/L — ABNORMAL LOW (ref 20.0–28.0)
Drawn by: 22052
Drawn by: 22052
FIO2: 40 %
FIO2: 40 %
MECHVT: 410 mL
MECHVT: 480 mL
O2 Saturation: 99.2 %
O2 Saturation: 97.6 %
PEEP: 5 cmH2O
Patient temperature: 37
Patient temperature: 37
RATE: 22 resp/min
RATE: 22 resp/min
pCO2 arterial: 49 mmHg — ABNORMAL HIGH (ref 32–48)
pCO2 arterial: 36 mmHg (ref 32–48)
pH, Arterial: 7.13 — CL (ref 7.35–7.45)
pH, Arterial: 7.29 — ABNORMAL LOW (ref 7.35–7.45)
pO2, Arterial: 102 mmHg (ref 83–108)
pO2, Arterial: 79 mmHg — ABNORMAL LOW (ref 83–108)

## 2022-06-30 LAB — PHOSPHORUS: Phosphorus: 8.1 mg/dL — ABNORMAL HIGH (ref 2.5–4.6)

## 2022-06-30 LAB — LACTIC ACID, PLASMA
Lactic Acid, Venous: 6.6 mmol/L (ref 0.5–1.9)
Lactic Acid, Venous: 5.2 mmol/L (ref 0.5–1.9)
Lactic Acid, Venous: 3.7 mmol/L (ref 0.5–1.9)

## 2022-06-30 LAB — CREATININE, URINE, RANDOM: Creatinine, Urine: 48 mg/dL

## 2022-06-30 LAB — URINALYSIS, ROUTINE W REFLEX MICROSCOPIC
Bilirubin Urine: NEGATIVE
Glucose, UA: NEGATIVE mg/dL
Ketones, ur: NEGATIVE mg/dL
Leukocytes,Ua: NEGATIVE
Nitrite: NEGATIVE
Protein, ur: 30 mg/dL — AB
Specific Gravity, Urine: >1.03 — ABNORMAL HIGH (ref 1.005–1.030)
pH: 5 (ref 5.0–8.0)

## 2022-06-30 LAB — URINALYSIS, MICROSCOPIC (REFLEX): WBC, UA: NONE SEEN WBC/hpf (ref 0–5)

## 2022-06-30 LAB — HEPARIN LEVEL (UNFRACTIONATED): Heparin Unfractionated: 0.61 IU/mL (ref 0.30–0.70)

## 2022-06-30 LAB — MAGNESIUM: Magnesium: 3.1 mg/dL — ABNORMAL HIGH (ref 1.7–2.4)

## 2022-06-30 LAB — PREPARE RBC (CROSSMATCH)

## 2022-06-30 LAB — SODIUM, URINE, RANDOM: Sodium, Ur: 18 mmol/L

## 2022-06-30 LAB — TRIGLYCERIDES: Triglycerides: 91 mg/dL (ref <150)

## 2022-06-30 MED ORDER — SODIUM CHLORIDE 0.9% IV SOLUTION: Freq: Once | INTRAVENOUS | Status: DC

## 2022-06-30 MED ORDER — PANTOPRAZOLE SODIUM 40 MG IV SOLR
40.0000 mg | Freq: Two times a day (BID) | INTRAVENOUS | Status: DC
  Administered 2022-06-30 – 2022-07-01 (×4): 40 mg via INTRAVENOUS
  Filled 2022-06-30 (×4): qty 10

## 2022-06-30 MED ORDER — SODIUM CHLORIDE 0.9 % IV SOLN
500.0000 mg | Freq: Two times a day (BID) | INTRAVENOUS | Status: DC
  Administered 2022-06-30 – 2022-07-03 (×8): 500 mg via INTRAVENOUS
  Filled 2022-06-30 (×8): qty 10

## 2022-06-30 MED ORDER — VANCOMYCIN VARIABLE DOSE PER UNSTABLE RENAL FUNCTION (PHARMACIST DOSING): Status: DC

## 2022-06-30 MED ORDER — INSULIN ASPART 100 UNIT/ML IJ SOLN
0.0000 [IU] | INTRAMUSCULAR | Status: DC
  Administered 2022-06-30: 3 [IU] via SUBCUTANEOUS
  Administered 2022-06-30: 2 [IU] via SUBCUTANEOUS
  Administered 2022-06-30: 3 [IU] via SUBCUTANEOUS
  Administered 2022-07-01: 2 [IU] via SUBCUTANEOUS
  Administered 2022-07-01: 1 [IU] via SUBCUTANEOUS
  Administered 2022-07-01: 2 [IU] via SUBCUTANEOUS
  Administered 2022-07-01 (×3): 1 [IU] via SUBCUTANEOUS
  Administered 2022-07-02 (×2): 2 [IU] via SUBCUTANEOUS
  Administered 2022-07-02 (×3): 1 [IU] via SUBCUTANEOUS
  Administered 2022-07-03: 2 [IU] via SUBCUTANEOUS
  Administered 2022-07-03: 1 [IU] via SUBCUTANEOUS
  Administered 2022-07-03: 2 [IU] via SUBCUTANEOUS
  Administered 2022-07-03 – 2022-07-05 (×12): 1 [IU] via SUBCUTANEOUS
  Administered 2022-07-06: 2 [IU] via SUBCUTANEOUS
  Administered 2022-07-06: 1 [IU] via SUBCUTANEOUS
  Administered 2022-07-06: 2 [IU] via SUBCUTANEOUS
  Administered 2022-07-06 – 2022-07-07 (×7): 1 [IU] via SUBCUTANEOUS
  Administered 2022-07-07: 2 [IU] via SUBCUTANEOUS
  Administered 2022-07-08 (×2): 1 [IU] via SUBCUTANEOUS

## 2022-06-30 MED ORDER — STERILE WATER FOR INJECTION IV SOLN
INTRAVENOUS | Status: AC
  Filled 2022-06-30: qty 150

## 2022-06-30 MED ORDER — MEDIHONEY WOUND/BURN DRESSING EX PSTE
1.0000 | PASTE | Freq: Every day | CUTANEOUS | Status: AC
  Administered 2022-07-01 – 2022-07-07 (×6): 1 via TOPICAL
  Filled 2022-06-30: qty 44

## 2022-06-30 MED ORDER — SODIUM ZIRCONIUM CYCLOSILICATE 5 G PO PACK
5.0000 g | PACK | Freq: Once | ORAL | Status: AC
  Administered 2022-06-30: 5 g
  Filled 2022-06-30: qty 1

## 2022-06-30 NOTE — Progress Notes (Signed)
eLink Physician-Brief Progress Note Patient Name: Travis Wolfe DOB: 11/10/1934 MRN: [id number]   Date of Service  06/30/2022  HPI/Events of Note  Pt over breathing vent RR 29 belly breathing, requiring Q1 Fent 100 IVP  eICU Interventions  Propofol to be resumed at a low dose of 10 Discussed with bedside RN     Intervention Category Intermediate Interventions: Other:;Respiratory distress - evaluation and management  Judd Lien 06/30/2022, 10:11 PM

## 2022-06-30 NOTE — Procedures (Signed)
Arterial Catheter Insertion Procedure Note  Travis Wolfe  [id number]  1934-11-18  Date:06/30/22  Time:8:33 AM    Provider Performing: Freddi Starr    Procedure: Insertion of Arterial Line [phone]) with US guidance (10932)   Indication(s) Blood pressure monitoring and/or need for frequent ABGs  Consent Risks of the procedure as well as the alternatives and risks of each were explained to the patient and/or caregiver.  Consent for the procedure was obtained and is signed in the bedside chart  Anesthesia None   Time Out Verified patient identification, verified procedure, site/side was marked, verified correct patient position, special equipment/implants available, medications/allergies/relevant history reviewed, required imaging and test results available.   Sterile Technique Maximal sterile technique including full sterile barrier drape, hand hygiene, sterile gown, sterile gloves, mask, hair covering, sterile ultrasound probe cover (if used).   Procedure Description Area of catheter insertion was cleaned with chlorhexidine and draped in sterile fashion. With real-time ultrasound guidance an arterial catheter was placed into the right radial artery.  Appropriate arterial tracings confirmed on monitor.     Complications/Tolerance None; patient tolerated the procedure well.   EBL Minimal   Specimen(s) None

## 2022-06-30 NOTE — Progress Notes (Signed)
ABG completed , called and sent to lab 

## 2022-06-30 NOTE — Progress Notes (Signed)
Follow up visit with wife, providing presence and support. Wife shared some updates from medical staff as well as her concerns that may differ from treatment suggestions. I have suggested there be a " goals of care " meeting with all concerned on the care team. Please include spiritual care staff in this meet to support wife and family she would like to have present.

## 2022-06-30 NOTE — Progress Notes (Signed)
ABG completed, called and sent to the lab

## 2022-06-30 NOTE — Progress Notes (Signed)
NAME:  Travis Wolfe, MRN:  [id number], DOB:  04/21/1935, LOS: 5 ADMISSION DATE:  06/25/2022, CONSULTATION DATE:  06/28/22 REFERRING MD:  Flora Lipps, MD CHIEF COMPLAINT:  respiratory failure   History of Present Illness:  Travis Wolfe is an 88 year old male with history of atrial fibrillation on eliquis, hypertension and complex surgical history of the right hip with recent arthroplasty resection with placement of antibiotic spacer and started on daptomycin and ceftriaxone 06/07/22, who was admitted 06/25/22 from SNF with weakness and drop in hemoglobin. He was started on antibiotics for multifocal pneumonia based on chest radiograph on admission.  06/28/21 code stroke was called due to altered mentation and inability to move his upper extremities along with facial asymmetry. CTA head was negative for stroke.   He was more alert and responsive at time of evaluation in the ICU. His wife was at the bedside.   Orthopedics was planning return to OR on 1/13 but has post-poned that procedure at this time.   Pertinent  Medical History   Atrial Fibrillation Hypertension Mitral regurgitation  Significant Hospital Events: Including procedures, antibiotic start and stop dates in addition to other pertinent events   1/9 admitted to [facility] for weakness and anemia. Concern for pneumonia.  1/12 transferred to ICU for code stroke and increasing O2 needs 1/13 remained intubated after ortho hip proceudre, bronchoscopy and PICC line place.  Interim History / Subjective:   No acute events overnight He was on propofol 63mg/min overnight  Minimal Urine output  Objective   Blood pressure (!) 142/21, pulse 78, temperature (!) 97.5 F (36.4 C), temperature source Axillary, resp. rate (!) 23, height 5' 8"  (1.727 m), weight 72.6 kg, SpO2 99 %.    Vent Mode: PRVC FiO2 (%):  [30 %-70 %] 30 % Set Rate:  [22 bmp] 22 bmp Vt Set:  [410 mL] 410 mL PEEP:  [5 cmH20] 5 cmH20 Plateau Pressure:  [19 cmH20-22  cmH20] 19 cmH20   Intake/Output Summary (Last 24 hours) at 06/30/2022 0836 Last data filed at 06/30/2022 [id number] per 24 hour  Intake 3076.34 ml  Output 400 ml  Net 2676.34 ml   Filed Weights   06/25/22 0940 06/27/22 0500 06/28/22 0505  Weight: 67.1 kg 72.5 kg 72.6 kg    Examination: General: elderly male, mild distress, ill appearing HENT: AT/[place], moist mucous membranes, sclera anicteric Lungs: course breath sounds, no wheezing Cardiovascular: rrr, no murmurs Abdomen: soft, non-tender, non-distended, BS+ Extremities: warm, trace edema Neuro: A&Ox3 but slow to respond, unable to move RLE due to hip issues,  GU: external foley   Echo 06/26/22 LV EF 511%RV systolic function and size are normal, but noted to have elevated pulmonary pressures of 71mg.   Labs Cr 2.29 K 5.4 LA 6.6 up from 2.5 Hbg 7.9g/dL  Resolved Hospital Problem list     Assessment & Plan:  Acute Hypoxemic Respiratory Failure Multifocal Pneumonia ?Daptomycin induced eosinophilic pneumonia - stoped daptomycin 1/12 - continue solumedrol 4044mID, started 1/12 - HCAP coverage with cefepime then zosyn, changed to meropenem 1/14 - Continue vanc and azithromycin - F/u BAL cultures - f/u fungitell and urine legionella - viral testing is negative  Septic Shock due to Pneumonia vs other source - blood cultures no growth to date - f/u BAL cultures - meropenem, vanc and azithro for antibiotic coverage - lactic acid rising, will give 2 units PRBCs - MAP goal 65 or higher  Acute Encephalopathy In setting of toxic metabolic derangements with respiratory  failure and sepsis. CTA head is negative for stroke. He has been on cefepime since admission.  - monitor neuro status closely - MRI brain 1/13 negative for stroke or hemorrhage - cefepime stopped 1/12 - stopped baclofen 1/13  NSTEMI Pulmonary Hypertension Paroxysmal Atrial Fibrillation Hx of hypertension - elevated troponing and inverted T waves in  lateral leads - started heparin/asa 1/13 - stopping heparin today due to drop in hemoglobin and increase in LA  Anemia ?GI bleeding - start PPI BID IV - transfusing 2 units PRBCs this AM - monitor for further bloody bowel movements  S/p Right hip arthroplasty resection with antibiotic spacer Right Hip Seroma - OR 1/13 for removal of antibiotic spacer for comfort  Acute Kidney Injury Lactic Acidosis Anion Gap Metabolic Acidosis Hyperkalemia - avoid nephrotoxic agents - start bicarb drip 55m/hr - Dicussed with family, patient would not want dialysis - lokelma 585mthis AM - repeat BMP this afternoon  Anasarca Moderate Protein Calorie Malnutrition - doboff placed, continue trickle tube feeds   Best Practice (right click and "Reselect all SmartList Selections" daily)   Diet/type: tubefeeds DVT prophylaxis: prophylactic heparin  GI prophylaxis: PPI Lines: Central line and Arterial Line Foley:  Yes, and it is still needed Code Status:  DNR Last date of multidisciplinary goals of care discussion [Discussed with patient's wife at bedside 1/14]  Labs   CBC: Recent Labs  Lab 06/25/22 0957 06/25/22 1831 06/27/22 0552 06/28/22 0155 06/28/22 1200 06/29/22 0418 06/30/22 0515  WBC 7.5   < > 8.7 6.4 6.1 7.0 12.7*  NEUTROABS 6.5  --   --   --  5.5 6.2  --   HGB 5.9*   < > 8.6* 9.6* 11.3* 9.6* 7.9*  HCT 18.8*   < > 26.8* 29.7* 35.6* 31.2* 27.2*  MCV 102.7*   < > 93.1 94.9 94.4 96.3 102.3*  PLT 249   < > 242 224 260 256 307   < > = values in this interval not displayed.    Basic Metabolic Panel: Recent Labs  Lab 06/27/22 0552 06/28/22 0155 06/28/22 1430 06/29/22 0418 06/29/22 1342 06/30/22 0515  NA 139 137 137 140 140 138  K 3.3* 3.2* 4.4 3.9 4.6 5.4*  CL 106 106 104 108 107 101  CO2 26 21* 24 23 21* 17*  GLUCOSE 109* 137* 136* 167* 198* 196*  BUN 23 27* 36* 41* 46* 59*  CREATININE 0.84 1.03 0.88 1.26* 1.48* 2.29*  CALCIUM 8.1* 8.1* 8.1* 8.2* 8.7* 8.9  MG 2.0  1.9 3.0*  --   --  3.1*  PHOS  --   --  3.8  --   --  8.1*   GFR: Estimated Creatinine Clearance: 22.8 mL/min (A) (by C-G formula based on SCr of 2.29 mg/dL (H)). Recent Labs  Lab 06/28/22 0155 06/28/22 1126 06/28/22 1147 06/28/22 1200 06/28/22 1426 06/29/22 0418 06/29/22 0439 06/29/22 1552 06/29/22 1844 06/30/22 0515  PROCALCITON  --  7.46  --   --   --   --  7.74  --   --   --   WBC 6.4  --   --  6.1  --  7.0  --   --   --  12.7*  LATICACIDVEN  --   --    < >  --  2.4*  --   --  2.9* 2.5* 6.6*   < > = values in this interval not displayed.    Liver Function Tests: Recent Labs  Lab 06/25/22 1837  06/26/22 0415 06/29/22 0418 06/29/22 1342 06/30/22 0515  AST  --  39 44* 49* 213*  ALT  --  22 30 28  87*  ALKPHOS  --  68 69 80 62  BILITOT  --  1.2 1.4* 1.1 1.6*  PROT  --  5.5* 5.2* 6.1* 6.7  ALBUMIN 1.9* 1.7* 1.7* 2.4* 3.4*   No results for input(s): "LIPASE", "AMYLASE" in the last 168 hours. No results for input(s): "AMMONIA" in the last 168 hours.  ABG    Component Value Date/Time   PHART 7.33 (L) 06/29/2022 0848   PCO2ART 52 (H) 06/29/2022 0848   PO2ART 275 (H) 06/29/2022 0848   HCO3 27.4 06/29/2022 0848   TCO2 24 06/06/2022 1213   ACIDBASEDEF 3.0 (H) 06/06/2022 1213   O2SAT 100 06/30/2022 0515     Coagulation Profile: Recent Labs  Lab 06/25/22 0957  INR 1.7*    Cardiac Enzymes: No results for input(s): "CKTOTAL", "CKMB", "CKMBINDEX", "TROPONINI" in the last 168 hours.  HbA1C: No results found for: "HGBA1C"  CBG: Recent Labs  Lab 06/29/22 1638 06/29/22 2037 06/29/22 2350 06/30/22 0333 06/30/22 0811  GLUCAP 168* 178* 182* 174* 170*     Critical care time: 90 minutes    Freda Jackson, MD [facility] Office: [phone]   See Amion for personal pager PCCM on call pager [phone] until 7pm. Please call Elink 7p-7a. [phone]

## 2022-06-30 NOTE — IPAL (Signed)
  Interdisciplinary Goals of Care Family Meeting   Date carried out: 06/30/2022  Location of the meeting: Bedside  Member's involved: Physician, Bedside Registered Nurse, and Family Member or next of kin  Durable Power of Attorney or acting medical decision maker: Rowland Ericsson    Discussion: We discussed goals of care for Travis Wolfe .  We discussed his critical status at this time and concern chest compressions would do more harm than good. Christine expressed understanding, discussed this with family and returned to let us know to change his code status to DNR but continue current aggressive measures. We also discussed the potential need for dialsysis and patient has clearly stated in the past that he would not want that.  Code status:   Code Status: DNR   Disposition: Continue current acute care  Time spent for the meeting: 15 minutes    Freddi Starr, MD  06/30/2022, 8:52 AM

## 2022-06-30 NOTE — Progress Notes (Signed)
Pharmacy: Re- heparin  Heparin drip turned off this morning at ~8a d/t anemia.  Pharmacy will d/c heparin order and pharmacy consult.  If/when anticoag. can be  resumed back for pt, please enter new pharmacy consult.   Dia Sitter, PharmD, BCPS 06/30/2022 2:30 PM

## 2022-06-30 NOTE — Progress Notes (Signed)
ANTICOAGULATION CONSULT NOTE - Follow Up Consult  Pharmacy Consult for heparin Indication: atrial fibrillation  Allergies  Allergen Reactions   Tape Dermatitis and Rash    ONLY use Paper tape   Other     Cat dander   Doxycycline Rash   Horse Epithelium Rash    Patient Measurements: Height: 5\' 8"  (172.7 cm) Weight: 72.6 kg (160 lb 0.9 oz) IBW/kg (Calculated) : 68.4 Heparin Dosing Weight: 72 kg  Vital Signs: Temp: 97.5 F (36.4 C) (01/14 0400) Temp Source: Axillary (01/14 0400) BP: 142/21 (01/14 0608) Pulse Rate: 78 (01/14 0608)  Labs: Recent Labs    06/28/22 1200 06/28/22 1430 06/28/22 2358 06/29/22 0418 06/29/22 1342 06/29/22 1353 06/29/22 1552 06/30/22 0515 06/30/22 0645  HGB 11.3*  --   --  9.6*  --   --   --  7.9*  --   HCT 35.6*  --   --  31.2*  --   --   --  27.2*  --   PLT 260  --   --  256  --   --   --  307  --   HEPARINUNFRC  --   --   --   --   --   --   --   --  0.61  CREATININE  --    < >  --  1.26* 1.48*  --   --  2.29*  --   TROPONINIHS  --    < > 418*  --   --  451* 553*  --   --    < > = values in this interval not displayed.    Estimated Creatinine Clearance: 22.8 mL/min (A) (by C-G formula based on SCr of 2.29 mg/dL (H)).   Medications:  - on Eliquis 2.5 mg bid PTA (last dose taken on 1/8)   Assessment: Patient is an 88 y.o M with hx afib on Eliquis PTA who presented to the ED from SNF after he was found to have low hgb. Eliquis held on admission d/t anemia and hematoma noted at his right surgical hip wound. Code stroke activated on 1/12, but both head CT and MRI showed no acute findings. On 1/13, he went to the OR for resection of THA and removal of abx spacer. Heparin drip stated on 1/13 post-op for afib.   Today, 06/30/2022: - heparin level is therapeutic at 0.61 - hgb down 7.9, plts ok; 2 units PRBC ordered  - scr up 2.29  Goal of Therapy:  Heparin level 0.3-0.7 units/ml Monitor platelets by anticoagulation protocol: Yes   Plan:   - Per pt's RN, heparin drip was stopped this morning at 7:50a d/t anemia - f/u with [facility] plan   Marlen Mollica P 06/30/2022,8:30 AM

## 2022-06-30 NOTE — Progress Notes (Signed)
Pharmacy Antibiotic Note  Travis Wolfe is a 88 y.o. 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  During this admission, he underwent excisional TKA with abx spacers on 06/06/22 and discharged on daptomycin and ceftriaxone with plan to treat through 07/19/22.  He presented back to the ED on 06/25/22 from SNF after he was found to have low hgb.  CXR on 06/25/22 showed findings with concern for multifocal PNA. He was started on vancomycin/cefepime on admission, changed from vancomycin to daptomycin on 06/27/22 and on 06/28/22  abx changed to vancomycin and zosyn d/t concern for eosinophilic pneumonia due to daptomycin. With worsening renal function and risk for nephrotoxicity with vanc/zosyn, pharmacy has been consulted to change zosyn to meropenem.   - 1/13: Went to OR for R total hip revision and removal of abx spacer. bronchoscopy   Today, 06/30/2022: - scr up 2.29 (crcl~23); low UOP; starting Na Bicarb drip today - hypothermic, WBC 12.7 - remains intubated, on pressors  Plan: - With worsening renal function, will d/c standing vancomycin order. Check random vancomycin level at 8p and redose if level is  < 20 - meropenem 500 mg IV q12h - azithromycin 500 mg q24h per MD   _________________________________________ Height: 5' 8"  (172.7 cm) Weight: 72.6 kg (160 lb 0.9 oz) IBW/kg (Calculated) : 68.4  Temp (24hrs), Avg:97.6 F (36.4 C), Min:97.2 F (36.2 C), Max:98 F (36.7 C)  Recent Labs  Lab 06/27/22 0552 06/28/22 0155 06/28/22 1147 06/28/22 1200 06/28/22 1426 06/28/22 1430 06/29/22 0418 06/29/22 1342 06/29/22 1552 06/29/22 1844 06/30/22 0515  WBC 8.7 6.4  --  6.1  --   --  7.0  --   --   --  12.7*  CREATININE 0.84 1.03  --   --   --  0.88 1.26* 1.48*  --   --  2.29*  LATICACIDVEN  --   --  2.4*  --  2.4*  --   --   --  2.9* 2.5* 6.6*     Estimated Creatinine Clearance: 22.8 mL/min (A) (by C-G formula based on SCr of 2.29 mg/dL  (H)).    Allergies  Allergen Reactions   Tape Dermatitis and Rash    ONLY use Paper tape   Other     Cat dander   Doxycycline Rash   Horse Epithelium Rash   1/9 Cefepime >>1/12 1/9 Vancomycin >> 1/11>> resumed 1/12>> 1/11 dapto >> 1/12 1/12 zosyn>> 1/14 1/12 azithro>> (1/17) 1/14 meropenem>>  1/9 MRSA PCR: neg 1/12 RVP: 1/12 bcx x2:  1/12 fungitell:  1/13 BAL  pneumocytis pcr:  1/13 BAL AFB:  1/13 BAL fungal:  1/13 BAL:  1/13 resp panel pcr: neg   Thank you for allowing pharmacy to be a part of this patient's care.  Lynelle Doctor, PharmD, BCPS Clinical Pharmacist 06/30/2022 8:05 AM

## 2022-07-01 ENCOUNTER — Inpatient Hospital Stay (HOSPITAL_COMMUNITY): Payer: Medicare PPO

## 2022-07-01 DIAGNOSIS — D649 Anemia, unspecified: Secondary | ICD-10-CM | POA: Diagnosis not present

## 2022-07-01 LAB — CBC
HCT: 25.9 % — ABNORMAL LOW (ref 39.0–52.0)
HCT: 26.2 % — ABNORMAL LOW (ref 39.0–52.0)
Hemoglobin: 8.5 g/dL — ABNORMAL LOW (ref 13.0–17.0)
Hemoglobin: 8.5 g/dL — ABNORMAL LOW (ref 13.0–17.0)
MCH: 29.5 pg (ref 26.0–34.0)
MCH: 29.8 pg (ref 26.0–34.0)
MCHC: 32.8 g/dL (ref 30.0–36.0)
MCHC: 32.4 g/dL (ref 30.0–36.0)
MCV: 89.9 fL (ref 80.0–100.0)
MCV: 91.9 fL (ref 80.0–100.0)
Platelets: 135 10*3/uL — ABNORMAL LOW (ref 150–400)
Platelets: 137 10*3/uL — ABNORMAL LOW (ref 150–400)
RBC: 2.88 MIL/uL — ABNORMAL LOW (ref 4.22–5.81)
RBC: 2.85 MIL/uL — ABNORMAL LOW (ref 4.22–5.81)
RDW: 18.9 % — ABNORMAL HIGH (ref 11.5–15.5)
RDW: 19.1 % — ABNORMAL HIGH (ref 11.5–15.5)
WBC: 11.6 10*3/uL — ABNORMAL HIGH (ref 4.0–10.5)
WBC: 11.9 10*3/uL — ABNORMAL HIGH (ref 4.0–10.5)
nRBC: 1.1 % — ABNORMAL HIGH (ref 0.0–0.2)
nRBC: 1 % — ABNORMAL HIGH (ref 0.0–0.2)

## 2022-07-01 LAB — GLUCOSE, CAPILLARY
Glucose-Capillary: 140 mg/dL — ABNORMAL HIGH (ref 70–99)
Glucose-Capillary: 160 mg/dL — ABNORMAL HIGH (ref 70–99)
Glucose-Capillary: 134 mg/dL — ABNORMAL HIGH (ref 70–99)
Glucose-Capillary: 142 mg/dL — ABNORMAL HIGH (ref 70–99)
Glucose-Capillary: 127 mg/dL — ABNORMAL HIGH (ref 70–99)

## 2022-07-01 LAB — HEMOGLOBIN A1C
Hgb A1c MFr Bld: 4.8 % (ref 4.8–5.6)
Mean Plasma Glucose: 91.06 mg/dL

## 2022-07-01 LAB — TYPE AND SCREEN
ABO/RH(D): A POS
Antibody Screen: POSITIVE
DAT, IgG: NEGATIVE
Donor AG Type: NEGATIVE
Donor AG Type: NEGATIVE
Unit division: 0
Unit division: 0

## 2022-07-01 LAB — COMPREHENSIVE METABOLIC PANEL
ALT: 211 U/L — ABNORMAL HIGH (ref 0–44)
AST: 310 U/L — ABNORMAL HIGH (ref 15–41)
Albumin: 2.9 g/dL — ABNORMAL LOW (ref 3.5–5.0)
Alkaline Phosphatase: 65 U/L (ref 38–126)
Anion gap: 12 (ref 5–15)
BUN: 84 mg/dL — ABNORMAL HIGH (ref 8–23)
CO2: 24 mmol/L (ref 22–32)
Calcium: 8.7 mg/dL — ABNORMAL LOW (ref 8.9–10.3)
Chloride: 101 mmol/L (ref 98–111)
Creatinine, Ser: 2.92 mg/dL — ABNORMAL HIGH (ref 0.61–1.24)
GFR, Estimated: 20 mL/min — ABNORMAL LOW (ref >60)
Glucose, Bld: 142 mg/dL — ABNORMAL HIGH (ref 70–99)
Potassium: 4.3 mmol/L (ref 3.5–5.1)
Sodium: 137 mmol/L (ref 135–145)
Total Bilirubin: 2.1 mg/dL — ABNORMAL HIGH (ref 0.3–1.2)
Total Protein: 5.1 g/dL — ABNORMAL LOW (ref 6.5–8.1)

## 2022-07-01 LAB — PROTIME-INR
INR: 1.7 — ABNORMAL HIGH (ref 0.8–1.2)
Prothrombin Time: 19.7 seconds — ABNORMAL HIGH (ref 11.4–15.2)

## 2022-07-01 LAB — BPAM RBC
Blood Product Expiration Date: 202402172359
Blood Product Expiration Date: 202402172359
ISSUE DATE / TIME: 202401140837
ISSUE DATE / TIME: 202401141129
Unit Type and Rh: 5100
Unit Type and Rh: 5100

## 2022-07-01 LAB — LACTIC ACID, PLASMA: Lactic Acid, Venous: 1.6 mmol/L (ref 0.5–1.9)

## 2022-07-01 LAB — BLOOD GAS, ARTERIAL
Acid-Base Excess: 2.5 mmol/L — ABNORMAL HIGH (ref 0.0–2.0)
Bicarbonate: 26.3 mmol/L (ref 20.0–28.0)
Drawn by: 22052
FIO2: 40 %
MECHVT: 480 mL
O2 Saturation: 98.8 %
PEEP: 5 cmH2O
Patient temperature: 37
RATE: 22 resp/min
pCO2 arterial: 37 mmHg (ref 32–48)
pH, Arterial: 7.46 — ABNORMAL HIGH (ref 7.35–7.45)
pO2, Arterial: 84 mmHg (ref 83–108)

## 2022-07-01 LAB — LEGIONELLA PNEUMOPHILA SEROGP 1 UR AG: L. pneumophila Serogp 1 Ur Ag: NEGATIVE

## 2022-07-01 LAB — VANCOMYCIN, RANDOM: Vancomycin Rm: 40 ug/mL

## 2022-07-01 MED ORDER — VITAL 1.5 CAL PO LIQD
1000.0000 mL | ORAL | Status: DC
  Administered 2022-07-01 – 2022-07-08 (×7): 1000 mL
  Filled 2022-07-01 (×10): qty 1000

## 2022-07-01 MED ORDER — HEPARIN SODIUM (PORCINE) 5000 UNIT/ML IJ SOLN
5000.0000 [IU] | Freq: Three times a day (TID) | INTRAMUSCULAR | Status: DC
  Administered 2022-07-01 – 2022-07-08 (×21): 5000 [IU] via SUBCUTANEOUS
  Filled 2022-07-01 (×21): qty 1

## 2022-07-01 MED ORDER — MIDODRINE HCL 5 MG PO TABS
10.0000 mg | ORAL_TABLET | Freq: Three times a day (TID) | ORAL | Status: DC
  Filled 2022-07-01: qty 2

## 2022-07-01 MED ORDER — METHYLPREDNISOLONE SODIUM SUCC 40 MG IJ SOLR
40.0000 mg | INTRAMUSCULAR | Status: DC
  Administered 2022-07-01 – 2022-07-07 (×7): 40 mg via INTRAVENOUS
  Filled 2022-07-01 (×7): qty 1

## 2022-07-01 MED ORDER — VITAL 1.5 CAL PO LIQD
1000.0000 mL | ORAL | Status: DC
  Filled 2022-07-01: qty 1000

## 2022-07-01 MED ORDER — MIDODRINE HCL 5 MG PO TABS
10.0000 mg | ORAL_TABLET | Freq: Three times a day (TID) | ORAL | Status: DC
  Administered 2022-07-01 – 2022-07-02 (×2): 10 mg
  Filled 2022-07-01: qty 2

## 2022-07-01 MED ORDER — SODIUM CHLORIDE 0.9 % IV SOLN: INTRAVENOUS | Status: DC | PRN

## 2022-07-01 MED ORDER — FUROSEMIDE 10 MG/ML IJ SOLN
120.0000 mg | Freq: Once | INTRAVENOUS | Status: AC
  Administered 2022-07-01: 120 mg via INTRAVENOUS
  Filled 2022-07-01: qty 12

## 2022-07-01 NOTE — Progress Notes (Signed)
Patient ID: Travis Wolfe, male   DOB: [birth year], 88 y.o.   MRN: [id number] Subjective: 2 Days Post-Op Procedure(s) (LRB): TOTAL HIP REVISION posterior approach (Right)    Patient remains intubated in critical condition  Objective:   VITALS:   Vitals:   07/01/22 0100 07/01/22 0331  BP:    Pulse: 65   Resp: (!) 31   Temp:  98 F (36.7 C)  SpO2: 93%     Exam: HV drain in right hip  LABS Recent Labs    06/30/22 1455 06/30/22 2100 07/01/22 0550  HGB 9.0* 8.7* 8.5*  HCT 28.9* 27.4* 25.9*  WBC 8.4 9.6 11.6*  PLT 153 128* 135*    Recent Labs    06/30/22 0515 06/30/22 1455 07/01/22 0550  NA 138 138 137  K 5.4* 4.6 4.3  BUN 59* 67* 84*  CREATININE 2.29* 2.49* 2.92*  GLUCOSE 196* 232* 142*    No results for input(s): "LABPT", "INR" in the last 72 hours.   Assessment/Plan: 2 Days Post-Op Procedure(s) (LRB): TOTAL HIP REVISION posterior approach (Right)  Plan: At this he is in need of critical care medical management I will be following and assisting in anyway possible

## 2022-07-01 NOTE — IPAL (Signed)
  Interdisciplinary Goals of Care Family Meeting   Date carried out: 07/01/2022  Location of the meeting: Conference room  Member's involved: Physician, Bedside Registered Nurse, and Family Member or next of kin  Durable Power of Attorney or acting medical decision maker: Joanthony Hamza    Discussion: We discussed goals of care for [employer] .  Met with patient's wife, son and sister. I provided an update in his current clinical status, active treatments and overall outlook. We discussed that his chances of not surviving this hospitalization are higher than surviving them. We discussed that there is no urgent need for CRRT at this time. They were appreciative of nephrology consult. They would like palliative care to meet with the family and discuss further needs. We will continue current level of management and discuss CRRT if the need arises.  Code status:   Code Status: DNR   Disposition: Continue current acute care  Time spent for the meeting: 40 minutes    Freddi Starr, MD  07/01/2022, 5:49 PM

## 2022-07-01 NOTE — Consult Note (Signed)
[facility]  HISTORY AND PHYSICAL  Travis HeritageRobert L Wolfe is an 88 y.o. male.    Chief Complaint: weakness  HPI: Pt is an 76M with a PMH sig for Afib, HTN, HLD, and R hip arthroplasty s/p resection and placement of spacer 06/07/22 admitted 06/25/22 for weakness at SNF and drop in Hgb.  Was ultimately found to have multifocal pneumonia and septic shock.  Had a code stroke 1/12 for change in mentation and facial droop, CT head negative.  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    Was intubated 06/29/22 and taken back to OR with ortho for spacer placement with vanc and tobra beads.  Remained intubated after procedure.    Initially required pressors, now off.  MAPS still a little soft.  Cr was 0.85 on admission, now 1.4 (1/13) --> 2.29 (1/14) --> 2.9 1/15, prompting evaluation.  Had CTA 1/12 at the time of code stroke.  In this setting we are asked to see.  UOP 211 mL yesterday.  K OK.    Pt's son at bedside.  Questions answered.     PMH: Past Medical History:  Diagnosis Date   A-fib (HCC)    REPORTS WAS DUE TO DEHYDRATION 3 YEARS AGO BUT WAS GIVEN FLUIDS IN ED AND MEDS TO SLOW HR AND DROVE HIMSELF HOME  ; HAD ABLATION X2 WITH CARDIO DR. AKBARY AS PRECAUTION  ; has been on eliquis since but reports he has not taken it for 5 weeks b/c he was told to hold it for the multiple irrigations and debridements for the hip infection. reports his cardiologist is aware he has been holding it;    Arthritis    Atherosclerosis of both carotid arteries    BPH (benign prostatic hyperplasia)    Dysrhythmia    a-fib   Fall    fell on friday 01-16-18;   lost balAnce whIile tryng to get his sock on ;sustained skin tear to left wrist ; no drainage  , scabbed over , and has been covering with bandage    Heart murmur    SINCE HIS 20s   HLD (hyperlipidemia)    Hypertension    Non-rheumatic mitral regurgitation    PSH: Past Surgical History:  Procedure  Laterality Date   BACK SURGERY  2007   L1-L5 LUMBAR WITH HARDWARE PLACED   CARDIOVERSION     CATARACT EXTRACTION, BILATERAL  2015   CRYOABLATION  2023   CARDIAC ABLATION and 2019   DACROCYSTORHINOSTOMY  07/26/2011   EXCISIONAL TOTAL HIP ARTHROPLASTY WITH ANTIBIOTIC SPACERS Right 06/06/2022   Procedure: EXCISIONAL TOTAL HIP ARTHROPLASTY WITH ANTIBIOTIC SPACERS;  Surgeon: Durene Romanslin, Matthew, MD;  Location: [facility];  Service: Orthopedics;  Laterality: Right;   HEMATOMA EVACUATION Right 01/31/2022   Procedure: Evacuation of right hip hematoma;  Surgeon: Durene Romanslin, Matthew, MD;  Location: [facility];  Service: Orthopedics;  Laterality: Right;  90 mins   HEMATOMA EVACUATION Right 05/02/2022   Procedure: Evacuation of right hip Seroma;  Surgeon: Durene Romanslin, Matthew, MD;  Location: [facility];  Service: Orthopedics;  Laterality: Right;   HERNIA REPAIR Bilateral    hx of echocardiogram      INCISION AND DRAINAGE HIP Right 01/26/2018   Procedure: Right hip irrigation and debridement, excisional and non excisional debridement, head liner exchange, posterior approach;  Surgeon: Durene Romanslin, Matthew, MD;  Location: [facility];  Service: Orthopedics;  Laterality: Right;  90 mins Would like to start earlier around 2:00pm if time opens  INCISION AND DRAINAGE HIP Right 11/26/2018   Procedure: IRRIGATION AND DEBRIDEMENT HIP;  Surgeon: Paralee Cancel, MD;  Location: [facility];  Service: Orthopedics;  Laterality: Right;  90 mins   INCISION AND DRAINAGE HIP Right 05/17/2021   Procedure: IRRIGATION AND DEBRIDEMENT RIGHT HIP, EVACUATION OF PSEUDOTUMOR;  Surgeon: Paralee Cancel, MD;  Location: [facility];  Service: Orthopedics;  Laterality: Right;   INGUINAL HERNIA REPAIR Bilateral    with mesh    IR US GUIDE BX ASP/DRAIN  12/17/2017   IRRIGATION AND ASPIRATION RIGHT HIP   12/10/2017   [place]  x2, [place] x1    KIDNEY STONE SURGERY     lithotripsy    KYPHOPLASTY     T7   LATERAL FUSION LUMBAR SPINE     L1-L5   PULMONARY VEIN ISOLATION AND LEFT  ATRIAL ROOFLINE ABLATION   10/15/2017   DR Minna Merritts    ROTATOR CUFF REPAIR Left    SHOULDER DEBRIDEMENT Right    SHOULDER OPEN ROTATOR CUFF REPAIR Left    SHOULDER SURGERY  1988   arthroscopy     TONSILLECTOMY     TOTAL HIP ARTHROPLASTY Right 2003   TOTAL HIP ARTHROPLASTY  2010   TOTAL HIP REVISION      05-2017, 10-2016     Past Medical History:  Diagnosis Date   A-fib ([place])    REPORTS WAS DUE TO DEHYDRATION 3 YEARS AGO BUT WAS GIVEN FLUIDS IN ED AND MEDS TO SLOW HR AND DROVE HIMSELF HOME  ; HAD ABLATION [place] DR. AKBARY AS PRECAUTION  ; has been on eliquis since but reports he has not taken it for 5 weeks b/c he was told to hold it for the multiple irrigations and debridements for the hip infection. reports his cardiologist is aware he has been holding it;    Arthritis    Atherosclerosis of both carotid arteries    BPH (benign prostatic hyperplasia)    Dysrhythmia    a-fib   Fall    fell on friday 01-16-18;   lost balAnce whIile tryng to get his sock on ;sustained skin tear to left wrist ; no drainage  , scabbed over , and has been covering with bandage    Heart murmur    SINCE HIS 20s   HLD (hyperlipidemia)    Hypertension    Non-rheumatic mitral regurgitation     Medications:  Scheduled:  Chlorhexidine Gluconate Cloth  6 each Topical Daily   cholecalciferol  2,000 Units Oral Daily   docusate  100 mg Per Tube BID   dorzolamide-timolol  1 drop Both Eyes BID   heparin  5,000 Units Subcutaneous Q8H   insulin aspart  0-9 Units Subcutaneous Q4H   leptospermum manuka honey  1 Application Topical Daily   methylPREDNISolone (SOLU-MEDROL) injection  40 mg Intravenous Q24H   midodrine  10 mg Oral TID WC   multivitamin with minerals  1 tablet Oral Daily   mouth rinse  15 mL Mouth Rinse Q2H   pantoprazole (PROTONIX) IV  40 mg Intravenous Q12H   polyethylene glycol  17 g Per Tube Daily   sodium chloride flush  10-40 mL Intracatheter Q12H   sodium chloride flush  10-40 mL  Intracatheter Q12H   vancomycin variable dose per unstable renal function (pharmacist dosing)   Does not apply See admin instructions   zinc sulfate  220 mg Oral Daily    Medications Prior to Admission  Medication Sig Dispense Refill   acetaminophen (TYLENOL) 325  MG tablet Take 325 mg by mouth every 4 (four) hours as needed for mild pain.     acetaminophen (TYLENOL) 500 MG tablet Take 2 tablets (1,000 mg total) by mouth every 6 (six) hours. 30 tablet 0   apixaban (ELIQUIS) 2.5 MG TABS tablet Take 2.5 mg by mouth 2 (two) times daily.     Biotin 5 MG CAPS Take 5 mg by mouth daily.     cefTRIAXone (ROCEPHIN) IVPB Inject 2 g into the vein daily. Indication:  R hip PJI First Dose: Yes Last Day of Therapy:  07/19/2022 Labs - Once weekly:  CBC/D, CMP, CPK, ESR and CRP Please leave PIC in place until doctor has seen patient or been notified Fax weekly labs to [phone] Method of administration: IV Push Method of administration may be changed at the discretion of home infusion pharmacist based upon assessment of the patient and/or caregiver's ability to self-administer the medication ordered. 40 Units 0   Cholecalciferol (VITAMIN D) 50 MCG (2000 UT) tablet Take 2,000 Units by mouth daily.     COSOPT PF 22.3-6.8 MG/ML SOLN ophthalmic solution Place 1 drop into both eyes in the morning and at bedtime.  3   daptomycin (CUBICIN) IVPB Inject 500 mg into the vein daily. Indication:  R hip PJI First Dose: Yes Last Day of Therapy:  07/19/2022 Labs - Once weekly:  CBC/D, CMP, CPK, ESR and CRP Please leave PIC in place until doctor has seen patient or been notified Fax weekly labs to [phone] Method of administration: IV Push Method of administration may be changed at the discretion of home infusion pharmacist based upon assessment of the patient and/or caregiver's ability to self-administer the medication ordered. 40 Units 0   docusate sodium (COLACE) 100 MG capsule Take 1 capsule (100 mg total) by  mouth 2 (two) times daily. 10 capsule 0   Eszopiclone 3 MG TABS Take 3 mg by mouth at bedtime.  1   Ferrous Sulfate (SLOW RELEASE IRON PO) Take 325 mg by mouth daily.     furosemide (LASIX) 20 MG tablet Take 20 mg by mouth daily as needed for fluid.     ipratropium-albuterol (DUONEB) 0.5-2.5 (3) MG/3ML SOLN Take 3 mLs by nebulization every 4 (four) hours as needed (wheezing).     lidocaine (LIDODERM) 5 % Place 1 patch onto the skin daily. Remove & Discard patch within 12 hours or as directed by MD 30 patch 0   loperamide (IMODIUM A-D) 2 MG tablet Take 4 mg by mouth 4 (four) times daily as needed for diarrhea or loose stools.     Lutein 20 MG TABS Take 20 mg by mouth daily.     meclizine (ANTIVERT) 12.5 MG tablet Take 1 tablet (12.5 mg total) by mouth 2 (two) times daily as needed for dizziness. 20 tablet 0   methocarbamol (ROBAXIN) 500 MG tablet Take 1 tablet (500 mg total) by mouth every 6 (six) hours as needed for muscle spasms. 40 tablet 1   Multiple Minerals (CALCIUM-MAGNESIUM-ZINC) TABS Take 1 tablet by mouth daily.     oxyCODONE (OXY IR/ROXICODONE) 5 MG immediate release tablet Take 1-2 tablets (5-10 mg total) by mouth every 4 (four) hours as needed for severe pain (1 tab pain score 4-6, 2 tabs pain score 7-10). 42 tablet 0   OXYGEN Inhale 2 L into the lungs 3 (three) times daily as needed (shortness of breath).     polyethylene glycol (MIRALAX / GLYCOLAX) 17 g packet Take 17 g  by mouth daily as needed for mild constipation. 14 each 0   potassium chloride (KLOR-CON) 10 MEQ tablet Take 10 mEq by mouth daily.     ranolazine (RANEXA) 500 MG 12 hr tablet Take 500 mg by mouth 2 (two) times daily.     tadalafil (CIALIS) 5 MG tablet Take 5 mg by mouth daily as needed for erectile dysfunction.     valACYclovir (VALTREX) 1000 MG tablet Take 1,000 mg by mouth daily as needed (fever blister).     zinc gluconate 50 MG tablet Take 25 mg by mouth 2 (two) times daily.     azithromycin (ZITHROMAX) 250 MG  tablet Take 250 mg by mouth daily.      ALLERGIES:   Allergies  Allergen Reactions   Tape Dermatitis and Rash    ONLY use Paper tape   Other     Cat dander   Doxycycline Rash   Horse Epithelium Rash    FAM HX: Family History  Problem Relation Age of Onset   Hypertension Mother    Cancer Mother     Social History:   reports that he has never smoked. He has never used smokeless tobacco. He reports current alcohol use of about 3.0 standard drinks of alcohol per week. He reports that he does not use drugs.  ROS: ROS: not able to be obtained d/t intubation/ sedation  Blood pressure (!) 153/26, pulse 67, temperature 97.8 F (36.6 C), temperature source Axillary, resp. rate (!) 21, height 5\' 8"  (1.727 m), weight 74.2 kg, SpO2 97 %. PHYSICAL EXAM: Physical Exam GEN ill-appearing, in bed HEENT eyes cl;osed, ETT in palce NECK + JVD PULM coarse mechanical bilaterally CV RRR ABDsoft EXT 2+ anasarca NEURO intubated and sedated SKIN multiple weeping areas    Results for orders placed or performed during the hospital encounter of 06/25/22 (from the past 48 hour(s))  Comprehensive metabolic panel     Status: Abnormal   Collection Time: 06/29/22  1:42 PM  Result Value Ref Range   Sodium 140 135 - 145 mmol/L   Potassium 4.6 3.5 - 5.1 mmol/L   Chloride 107 98 - 111 mmol/L   CO2 21 (L) 22 - 32 mmol/L   Glucose, Bld 198 (H) 70 - 99 mg/dL    Comment: Glucose reference range applies only to samples taken after fasting for at least 8 hours.   BUN 46 (H) 8 - 23 mg/dL   Creatinine, Ser 07/01/22 (H) 0.61 - 1.24 mg/dL   Calcium 8.7 (L) 8.9 - 10.3 mg/dL   Total Protein 6.1 (L) 6.5 - 8.1 g/dL   Albumin 2.4 (L) 3.5 - 5.0 g/dL   AST 49 (H) 15 - 41 U/L   ALT 28 0 - 44 U/L   Alkaline Phosphatase 80 38 - 126 U/L   Total Bilirubin 1.1 0.3 - 1.2 mg/dL   GFR, Estimated 46 (L) >60 mL/min    Comment: (NOTE) Calculated using the CKD-EPI Creatinine Equation (2021)    Anion gap 12 5 - 15     Comment: Performed at [facility], [address] [place], [place]  Cortisol     Status: None   Collection Time: 06/29/22  1:42 PM  Result Value Ref Range   Cortisol, Plasma 16.2 ug/dL    Comment: (NOTE) AM    6.7 - 22.6 ug/dL PM   07/01/22       ug/dL Performed at [facility], [address] [place], [place]  Brain natriuretic peptide     Status: Abnormal   Collection Time: 06/29/22  1:42 PM  Result Value Ref Range   B Natriuretic Peptide 1,156.5 (H) 0.0 - 100.0 pg/mL    Comment: Performed at [facility], [address], [place], [place] [address]  Troponin I (High Sensitivity)     Status: Abnormal   Collection Time: 06/29/22  1:53 PM  Result Value Ref Range   Troponin I (High Sensitivity) 451 (HH) <18 ng/L    Comment: CRITICAL RESULT CALLED TO, READ BACK BY AND VERIFIED WITH DOMAN,J RN ON 06/29/22 AT 1539 BY GOLSONM (NOTE) Elevated high sensitivity troponin I (hsTnI) values and significant  changes across serial measurements may suggest ACS but many other  chronic and acute conditions are known to elevate hsTnI results.  Refer to the "Links" section for chest pain algorithms and additional  guidance. Performed at [facility], [address] [place], [place] [address]   Lactic acid, plasma     Status: Abnormal   Collection Time: 06/29/22  3:52 PM  Result Value Ref Range   Lactic Acid, Venous 2.9 (HH) 0.5 - 1.9 mmol/L    Comment: CRITICAL VALUE NOTED. VALUE IS CONSISTENT WITH PREVIOUSLY REPORTED/CALLED VALUE Performed at [facility], [address], [place], [place] [address]   Troponin I (High Sensitivity)     Status: Abnormal   Collection Time: 06/29/22  3:52 PM  Result Value Ref Range   Troponin I (High Sensitivity) 553 (HH) <18 ng/L    Comment: CRITICAL RESULT CALLED TO, READ BACK BY AND VERIFIED WITH DOMAN,J RN ON 06/29/22 AT 1753 BY GOLSONM (NOTE) Elevated high  sensitivity troponin I (hsTnI) values and significant  changes across serial measurements may suggest ACS but many other  chronic and acute conditions are known to elevate hsTnI results.  Refer to the "Links" section for chest pain algorithms and additional  guidance. Performed at [facility], [address] [place], [place] [address]   Glucose, capillary     Status: Abnormal   Collection Time: 06/29/22  4:38 PM  Result Value Ref Range   Glucose-Capillary 168 (H) 70 - 99 mg/dL    Comment: Glucose reference range applies only to samples taken after fasting for at least 8 hours.   Comment 1 Notify RN    Comment 2 Document in Chart   Lactic acid, plasma     Status: Abnormal   Collection Time: 06/29/22  6:44 PM  Result Value Ref Range   Lactic Acid, Venous 2.5 (HH) 0.5 - 1.9 mmol/L    Comment: CRITICAL VALUE NOTED. VALUE IS CONSISTENT WITH PREVIOUSLY REPORTED/CALLED VALUE Performed at [facility], [address], [place], [place] [address]   Glucose, capillary     Status: Abnormal   Collection Time: 06/29/22  8:37 PM  Result Value Ref Range   Glucose-Capillary 178 (H) 70 - 99 mg/dL    Comment: Glucose reference range applies only to samples taken after fasting for at least 8 hours.   Comment 1 Notify RN    Comment 2 Document in Chart   Glucose, capillary     Status: Abnormal   Collection Time: 06/29/22 11:50 PM  Result Value Ref Range   Glucose-Capillary 182 (H) 70 - 99 mg/dL    Comment: Glucose reference range applies only to samples taken after fasting for at least 8 hours.   Comment 1 Notify RN    Comment 2 Document in Chart  Glucose, capillary     Status: Abnormal   Collection Time: 06/30/22  3:33 AM  Result Value Ref Range   Glucose-Capillary 174 (H) 70 - 99 mg/dL    Comment: Glucose reference range applies only to samples taken after fasting for at least 8 hours.   Comment 1 Notify RN    Comment 2 Document in Chart   Triglycerides      Status: None   Collection Time: 06/30/22  5:15 AM  Result Value Ref Range   Triglycerides 91 <150 mg/dL    Comment: Performed at [facility], [address] [place], [place] [address]  CBC     Status: Abnormal   Collection Time: 06/30/22  5:15 AM  Result Value Ref Range   WBC 12.7 (H) 4.0 - 10.5 K/uL   RBC 2.66 (L) 4.22 - 5.81 MIL/uL   Hemoglobin 7.9 (L) 13.0 - 17.0 g/dL   HCT 40.3 (L) 47.4 - 25.9 %   MCV 102.3 (H) 80.0 - 100.0 fL   MCH 29.7 26.0 - 34.0 pg   MCHC 29.0 (L) 30.0 - 36.0 g/dL   RDW 56.3 (H) 87.5 - 64.3 %   Platelets 307 150 - 400 K/uL   nRBC 0.6 (H) 0.0 - 0.2 %    Comment: Performed at [facility], [address] [place], [place] [address]  Comprehensive metabolic panel     Status: Abnormal   Collection Time: 06/30/22  5:15 AM  Result Value Ref Range   Sodium 138 135 - 145 mmol/L   Potassium 5.4 (H) 3.5 - 5.1 mmol/L   Chloride 101 98 - 111 mmol/L   CO2 17 (L) 22 - 32 mmol/L   Glucose, Bld 196 (H) 70 - 99 mg/dL    Comment: Glucose reference range applies only to samples taken after fasting for at least 8 hours.   BUN 59 (H) 8 - 23 mg/dL   Creatinine, Ser 8.84 (H) 0.61 - 1.24 mg/dL   Calcium 8.9 8.9 - 16.6 mg/dL   Total Protein 6.7 6.5 - 8.1 g/dL   Albumin 3.4 (L) 3.5 - 5.0 g/dL   AST 063 (H) 15 - 41 U/L   ALT 87 (H) 0 - 44 U/L   Alkaline Phosphatase 62 38 - 126 U/L   Total Bilirubin 1.6 (H) 0.3 - 1.2 mg/dL   GFR, Estimated 27 (L) >60 mL/min    Comment: (NOTE) Calculated using the CKD-EPI Creatinine Equation (2021)    Anion gap 20 (H) 5 - 15    Comment: Performed at [facility], [address], [place], [place] [address]  Magnesium     Status: Abnormal   Collection Time: 06/30/22  5:15 AM  Result Value Ref Range   Magnesium 3.1 (H) 1.7 - 2.4 mg/dL    Comment: Performed at [facility], [address], [place], [place] [address]  Phosphorus     Status: Abnormal   Collection  Time: 06/30/22  5:15 AM  Result Value Ref Range   Phosphorus 8.1 (H) 2.5 - 4.6 mg/dL    Comment: Performed at [facility], [address] [place], [place] [address]  Cooxemetry Panel (carboxy, met, total hgb, O2 sat)     Status: Abnormal   Collection Time: 06/30/22  5:15 AM  Result Value Ref Range   Total hemoglobin 8.1 (L) 12.0 - 16.0 g/dL   O2 Saturation 202 %   Carboxyhemoglobin 2.7 (H) 0.5 - 1.5 %  Methemoglobin <0.7 0.0 - 1.5 %    Comment: Performed at [facility], [address], [place], [place] [address]  Lactic acid, plasma     Status: Abnormal   Collection Time: 06/30/22  5:15 AM  Result Value Ref Range   Lactic Acid, Venous 6.6 (HH) 0.5 - 1.9 mmol/L    Comment: CRITICAL RESULT CALLED TO, READ BACK BY AND VERIFIED WITH Karen Chafe @ 2426 ON 06/30/2022 BY Milly Jakob Performed at [facility], [address] [place], [place] [address]   Hemoglobin A1c     Status: None   Collection Time: 06/30/22  5:15 AM  Result Value Ref Range   Hgb A1c MFr Bld 4.8 4.8 - 5.6 %    Comment: (NOTE) Pre diabetes:          5.7%-6.4%  Diabetes:              >6.4%  Glycemic control for   <7.0% adults with diabetes    Mean Plasma Glucose 91.06 mg/dL    Comment: Performed at [facility], [address], [place], [place] [address]  Heparin level (unfractionated)     Status: None   Collection Time: 06/30/22  6:45 AM  Result Value Ref Range   Heparin Unfractionated 0.61 0.30 - 0.70 IU/mL    Comment: (NOTE) The clinical reportable range upper limit is being lowered to >1.10 to align with the FDA approved guidance for the current laboratory assay.  If heparin results are below expected values, and patient dosage has  been confirmed, suggest follow up testing of antithrombin III levels. Performed at [facility], [address] [place], [place] [address]   Prepare RBC (crossmatch)     Status: None    Collection Time: 06/30/22  7:34 AM  Result Value Ref Range   Order Confirmation      BB SAMPLE OR UNITS ALREADY AVAILABLE Performed at [facility], [address], [place], [place] [address]   Glucose, capillary     Status: Abnormal   Collection Time: 06/30/22  8:11 AM  Result Value Ref Range   Glucose-Capillary 170 (H) 70 - 99 mg/dL    Comment: Glucose reference range applies only to samples taken after fasting for at least 8 hours.   Comment 1 Notify RN    Comment 2 Document in Chart   Blood gas, arterial     Status: Abnormal   Collection Time: 06/30/22  8:45 AM  Result Value Ref Range   FIO2 40 %   Mode VENTILATOR    MECHVT 410 mL   RATE 22 resp/min   PEEP 5 cm H20   pH, Arterial 7.13 (LL) 7.35 - 7.45    Comment: CRITICAL RESULT CALLED TO, READ BACK BY AND VERIFIED WITH: BURDICK,J RN ON 06/30/22 AT 0857 BY GOLSONM    pCO2 arterial 49 (H) 32 - 48 mmHg   pO2, Arterial 102 83 - 108 mmHg   Bicarbonate 16.3 (L) 20.0 - 28.0 mmol/L   Acid-base deficit 12.8 (H) 0.0 - 2.0 mmol/L   O2 Saturation 99.2 %   Patient temperature 37.0    Collection site A-LINE    Drawn by [id number]    Allens test (pass/fail) PASS PASS    Comment: Performed at [facility], [address], [place], [place] [address]  Glucose, capillary     Status: Abnormal   Collection Time: 06/30/22 11:20 AM  Result Value Ref Range  Glucose-Capillary 246 (H) 70 - 99 mg/dL    Comment: Glucose reference range applies only to samples taken after fasting for at least 8 hours.   Comment 1 Notify RN    Comment 2 Document in Chart   Blood gas, arterial     Status: Abnormal   Collection Time: 06/30/22  1:44 PM  Result Value Ref Range   FIO2 40% %   Mode VENTILATOR    MECHVT 480 mL   RATE 22 resp/min   pH, Arterial 7.29 (L) 7.35 - 7.45   pCO2 arterial 36 32 - 48 mmHg   pO2, Arterial 79 (L) 83 - 108 mmHg   Bicarbonate 17.3 (L) 20.0 - 28.0 mmol/L   Acid-base deficit 8.5 (H) 0.0 - 2.0  mmol/L   O2 Saturation 97.6 %   Patient temperature 37.0    Drawn by [id number]    Allens test (pass/fail) PASS PASS    Comment: Performed at [facility], [address], [place], [place] [address]  Basic metabolic panel     Status: Abnormal   Collection Time: 06/30/22  2:55 PM  Result Value Ref Range   Sodium 138 135 - 145 mmol/L   Potassium 4.6 3.5 - 5.1 mmol/L   Chloride 102 98 - 111 mmol/L   CO2 19 (L) 22 - 32 mmol/L   Glucose, Bld 232 (H) 70 - 99 mg/dL    Comment: Glucose reference range applies only to samples taken after fasting for at least 8 hours.   BUN 67 (H) 8 - 23 mg/dL   Creatinine, Ser 4.74 (H) 0.61 - 1.24 mg/dL   Calcium 8.3 (L) 8.9 - 10.3 mg/dL   GFR, Estimated 25 (L) >60 mL/min    Comment: (NOTE) Calculated using the CKD-EPI Creatinine Equation (2021)    Anion gap 17 (H) 5 - 15    Comment: Performed at [facility], [address] [place], [place] [address]  Lactic acid, plasma     Status: Abnormal   Collection Time: 06/30/22  2:55 PM  Result Value Ref Range   Lactic Acid, Venous 5.2 (HH) 0.5 - 1.9 mmol/L    Comment: CRITICAL RESULT CALLED TO, READ BACK BY AND VERIFIED WITH Marilynn Latino, J RN @ 1524 ON 06/30/2022 BY Milly Jakob Performed at [facility], [address] [place], [place] [address]   CBC with Differential/Platelet     Status: Abnormal   Collection Time: 06/30/22  2:55 PM  Result Value Ref Range   WBC 8.4 4.0 - 10.5 K/uL   RBC 3.03 (L) 4.22 - 5.81 MIL/uL   Hemoglobin 9.0 (L) 13.0 - 17.0 g/dL   HCT 43.3 (L) 29.5 - 18.8 %   MCV 95.4 80.0 - 100.0 fL   MCH 29.7 26.0 - 34.0 pg   MCHC 31.1 30.0 - 36.0 g/dL   RDW 41.6 (H) 60.6 - 30.1 %   Platelets 153 150 - 400 K/uL   nRBC 1.8 (H) 0.0 - 0.2 %   Neutrophils Relative % 83 %   Neutro Abs 7.0 1.7 - 7.7 K/uL   Lymphocytes Relative 10 %   Lymphs Abs 0.8 0.7 - 4.0 K/uL   Monocytes Relative 5 %   Monocytes Absolute 0.4 0.1 - 1.0 K/uL   Eosinophils  Relative 0 %   Eosinophils Absolute 0.0 0.0 - 0.5 K/uL   Basophils Relative 0 %   Basophils Absolute 0.0 0.0 - 0.1 K/uL   Immature Granulocytes 2 %   Abs  Immature Granulocytes 0.17 (H) 0.00 - 0.07 K/uL   Burr Cells PRESENT    Polychromasia PRESENT     Comment: Performed at [facility], [address] [place], [place] [id number]  Glucose, capillary     Status: Abnormal   Collection Time: 06/30/22  5:06 PM  Result Value Ref Range   Glucose-Capillary 246 (H) 70 - 99 mg/dL    Comment: Glucose reference range applies only to samples taken after fasting for at least 8 hours.   Comment 1 Notify RN    Comment 2 Document in Chart   Lactic acid, plasma     Status: Abnormal   Collection Time: 06/30/22  5:35 PM  Result Value Ref Range   Lactic Acid, Venous 3.7 (HH) 0.5 - 1.9 mmol/L    Comment: CRITICAL VALUE NOTED. VALUE IS CONSISTENT WITH PREVIOUSLY REPORTED/CALLED VALUE Performed at [facility], [address] [place], [place] [address]   Urinalysis, Routine w reflex microscopic Urine, Catheterized     Status: Abnormal   Collection Time: 06/30/22  5:37 PM  Result Value Ref Range   Color, Urine YELLOW YELLOW   APPearance CLEAR CLEAR   Specific Gravity, Urine >1.030 (H) 1.005 - 1.030   pH 5.0 5.0 - 8.0   Glucose, UA NEGATIVE NEGATIVE mg/dL   Hgb urine dipstick TRACE (A) NEGATIVE   Bilirubin Urine NEGATIVE NEGATIVE   Ketones, ur NEGATIVE NEGATIVE mg/dL   Protein, ur 30 (A) NEGATIVE mg/dL   Nitrite NEGATIVE NEGATIVE   Leukocytes,Ua NEGATIVE NEGATIVE    Comment: Performed at [facility], [address] [place], [place] [address]  Sodium, urine, random     Status: None   Collection Time: 06/30/22  5:37 PM  Result Value Ref Range   Sodium, Ur 18 mmol/L    Comment: Performed at [facility], [address] [place], [place] [address]  Creatinine, urine, random     Status: None   Collection Time: 06/30/22  5:37  PM  Result Value Ref Range   Creatinine, Urine 48 mg/dL    Comment: Performed at [facility], [address] [place], [place] [address]  Urinalysis, Microscopic (reflex)     Status: Abnormal   Collection Time: 06/30/22  5:37 PM  Result Value Ref Range   RBC / HPF 0-5 0 - 5 RBC/hpf   WBC, UA NONE SEEN 0 - 5 WBC/hpf   Bacteria, UA RARE (A) NONE SEEN   Squamous Epithelial / HPF 0-5 0 - 5 /HPF    Comment: Performed at [facility], [address] [place], [place] [address]  Glucose, capillary     Status: Abnormal   Collection Time: 06/30/22  7:49 PM  Result Value Ref Range   Glucose-Capillary 157 (H) 70 - 99 mg/dL    Comment: Glucose reference range applies only to samples taken after fasting for at least 8 hours.   Comment 1 Notify RN    Comment 2 Document in Chart   CBC     Status: Abnormal   Collection Time: 06/30/22  9:00 PM  Result Value Ref Range   WBC 9.6 4.0 - 10.5 K/uL   RBC 2.95 (L) 4.22 - 5.81 MIL/uL   Hemoglobin 8.7 (L) 13.0 - 17.0 g/dL   HCT 53.627.4 (L) 64.439.0 - 03.452.0 %   MCV 92.9 80.0 - 100.0 fL   MCH 29.5 26.0 - 34.0 pg   MCHC 31.8 30.0 - 36.0 g/dL  RDW 19.2 (H) 11.5 - 15.5 %   Platelets 128 (L) 150 - 400 K/uL   nRBC 1.0 (H) 0.0 - 0.2 %    Comment: Performed at [facility], [address], [place], [place] [address]  Glucose, capillary     Status: Abnormal   Collection Time: 06/30/22 11:43 PM  Result Value Ref Range   Glucose-Capillary 153 (H) 70 - 99 mg/dL    Comment: Glucose reference range applies only to samples taken after fasting for at least 8 hours.   Comment 1 Notify RN    Comment 2 Document in Chart   Vancomycin, random     Status: None   Collection Time: 07/01/22 12:40 AM  Result Value Ref Range   Vancomycin Rm 40 ug/mL    Comment:        Random Vancomycin therapeutic range is dependent on dosage and time of specimen collection. A peak range is 20-40 ug/mL A trough range is 5-15 ug/mL         Performed at [facility], [address] [place], [place] [address]   Glucose, capillary     Status: Abnormal   Collection Time: 07/01/22  3:26 AM  Result Value Ref Range   Glucose-Capillary 140 (H) 70 - 99 mg/dL    Comment: Glucose reference range applies only to samples taken after fasting for at least 8 hours.   Comment 1 Notify RN    Comment 2 Document in Chart   CBC     Status: Abnormal   Collection Time: 07/01/22  5:50 AM  Result Value Ref Range   WBC 11.6 (H) 4.0 - 10.5 K/uL   RBC 2.88 (L) 4.22 - 5.81 MIL/uL   Hemoglobin 8.5 (L) 13.0 - 17.0 g/dL   HCT 35.0 (L) 09.3 - 81.8 %   MCV 89.9 80.0 - 100.0 fL   MCH 29.5 26.0 - 34.0 pg   MCHC 32.8 30.0 - 36.0 g/dL   RDW 29.9 (H) 37.1 - 69.6 %   Platelets 135 (L) 150 - 400 K/uL   nRBC 1.1 (H) 0.0 - 0.2 %    Comment: Performed at [facility], [address], [place], [place] [address]  Comprehensive metabolic panel     Status: Abnormal   Collection Time: 07/01/22  5:50 AM  Result Value Ref Range   Sodium 137 135 - 145 mmol/L   Potassium 4.3 3.5 - 5.1 mmol/L   Chloride 101 98 - 111 mmol/L   CO2 24 22 - 32 mmol/L   Glucose, Bld 142 (H) 70 - 99 mg/dL    Comment: Glucose reference range applies only to samples taken after fasting for at least 8 hours.   BUN 84 (H) 8 - 23 mg/dL   Creatinine, Ser 1.01 (H) 0.61 - 1.24 mg/dL   Calcium 8.7 (L) 8.9 - 10.3 mg/dL   Total Protein 5.1 (L) 6.5 - 8.1 g/dL   Albumin 2.9 (L) 3.5 - 5.0 g/dL   AST 751 (H) 15 - 41 U/L   ALT 211 (H) 0 - 44 U/L   Alkaline Phosphatase 65 38 - 126 U/L   Total Bilirubin 2.1 (H) 0.3 - 1.2 mg/dL   GFR, Estimated 20 (L) >60 mL/min    Comment: (NOTE) Calculated using the CKD-EPI Creatinine Equation (2021)    Anion gap 12 5 - 15    Comment: Performed at [facility], [address] [place], [place] [address]  CBC  Status: Abnormal   Collection Time: 07/01/22  8:00 AM  Result Value Ref Range   WBC 11.9  (H) 4.0 - 10.5 K/uL   RBC 2.85 (L) 4.22 - 5.81 MIL/uL   Hemoglobin 8.5 (L) 13.0 - 17.0 g/dL   HCT 56.3 (L) 87.5 - 64.3 %   MCV 91.9 80.0 - 100.0 fL   MCH 29.8 26.0 - 34.0 pg   MCHC 32.4 30.0 - 36.0 g/dL   RDW 32.9 (H) 51.8 - 84.1 %   Platelets 137 (L) 150 - 400 K/uL   nRBC 1.0 (H) 0.0 - 0.2 %    Comment: Performed at [facility], [address] [place], [place] [address]  Lactic acid, plasma     Status: None   Collection Time: 07/01/22  8:00 AM  Result Value Ref Range   Lactic Acid, Venous 1.6 0.5 - 1.9 mmol/L    Comment: Performed at [facility], [address], [place], [place] [address]  Protime-INR     Status: Abnormal   Collection Time: 07/01/22  8:16 AM  Result Value Ref Range   Prothrombin Time 19.7 (H) 11.4 - 15.2 seconds   INR 1.7 (H) 0.8 - 1.2    Comment: (NOTE) INR goal varies based on device and disease states. Performed at [facility], [address], [place], [place] [address]   Glucose, capillary     Status: Abnormal   Collection Time: 07/01/22  8:24 AM  Result Value Ref Range   Glucose-Capillary 160 (H) 70 - 99 mg/dL    Comment: Glucose reference range applies only to samples taken after fasting for at least 8 hours.   Comment 1 Notify RN    Comment 2 Document in Chart   Blood gas, arterial     Status: Abnormal   Collection Time: 07/01/22  8:53 AM  Result Value Ref Range   FIO2 40 %   Mode PRESSURE REGULATED VOLUME CONTROL    MECHVT 480 mL   RATE 22 resp/min   PEEP 5 cm H20   pH, Arterial 7.46 (H) 7.35 - 7.45   pCO2 arterial 37 32 - 48 mmHg   pO2, Arterial 84 83 - 108 mmHg   Bicarbonate 26.3 20.0 - 28.0 mmol/L   Acid-Base Excess 2.5 (H) 0.0 - 2.0 mmol/L   O2 Saturation 98.8 %   Patient temperature 37.0    Collection site A-LINE    Drawn by [id number]    Allens test (pass/fail) PASS PASS    Comment: Performed at [facility], [address] [place], [place] [address]   Glucose, capillary     Status: Abnormal   Collection Time: 07/01/22 12:40 PM  Result Value Ref Range   Glucose-Capillary 134 (H) 70 - 99 mg/dL    Comment: Glucose reference range applies only to samples taken after fasting for at least 8 hours.    DG CHEST PORT 1 VIEW  Result Date: 07/01/2022 CLINICAL DATA:  Respiratory failure EXAM: PORTABLE CHEST 1 VIEW COMPARISON:  Prior chest x-ray yesterday FINDINGS: The patient remains intubated. The tip of the endotracheal tube is 4 cm above the carina. A right upper extremity PICC is present. The catheter tip overlies the mid SVC. A left upper extremity PICC is also present. The catheter tip overlies the mid SVC. Weighted tip feeding tube present. The tip of the tube overlies the upper stomach. Persistent diffuse bilateral interstitial and airspace opacities without interval change. IMPRESSION: 1. The enteric feeding  tube remains positioned in the stomach. 2. Slight interval change in the positioning of the tip of the left upper extremity PICC. The tip now overlies the mid SVC. 3. Other support apparatus in stable and satisfactory position. 4. Similar appearance of multifocal interstitial and airspace opacities throughout the lungs consistent with multifocal pneumonia. Electronically Signed   By: Malachy MoanHeath  McCullough M.D.   On: 07/01/2022 08:48   US Abdomen Complete  Result Date: 06/30/2022 CLINICAL DATA:  [id number] Abnormal liver enzymes [id number] EXAM: ABDOMEN ULTRASOUND COMPLETE COMPARISON:  August 16, 2021, June 16, 2013 FINDINGS: Evaluation is limited by patient inability to fully cooperate with exam. Gallbladder: Cholelithiasis and biliary sludge is noted. No wall thickening visualized. No sonographic Murphy sign noted by sonographer. Common bile duct: Diameter: Visualized portion measures 5 mm, within normal limits Liver: No focal lesion identified. Mildly heterogeneous parenchymal echogenicity. Portal vein is patent on color Doppler imaging with normal  direction of blood flow towards the liver. IVC: No abnormality visualized. Pancreas: Visualized portion unremarkable. Majority is not visualized secondary to shadowing bowel gas. Spleen: Poorly visualized secondary to shadowing bowel gas. Right Kidney: Length: 10.9 cm. Echogenicity is the upper limits of normal. No hydronephrosis visualized. Cortical thinning. Cyst of the inferior pole with mildly prominent internal septation measures 5.9 x 4.5 by 5.4 cm, similar in comparison to prior CT. This at been present since at least 2014 and is most consistent with a mildly complicated cyst (for which no dedicated imaging follow-up is recommended). Left Kidney: Length: 9.3 cm. Limited assessment secondary to shadowing bowel gas. Echogenicity is the upper limits of normal. No mass or hydronephrosis visualized. Abdominal aorta: No aneurysm visualized. Other findings: Small volume free fluid. IMPRESSION: 1. Cholelithiasis and biliary sludge without definitive sonographic evidence of acute cholecystitis. If persistent concern for acute cholecystitis, consider dedicated HIDA scan. 2. Small volume free fluid. Electronically Signed   By: Meda KlinefelterStephanie  Peacock M.D.   On: 06/30/2022 19:41   DG CHEST PORT 1 VIEW  Result Date: 06/30/2022 CLINICAL DATA:  Respiratory failure EXAM: PORTABLE CHEST 1 VIEW COMPARISON:  Prior chest x-ray 06/29/2022 FINDINGS: Patient is intubated. The tip of the endotracheal tube is 3 cm above the carina. Weighted tip feeding tube in unchanged position. The tip lies over the gastric fundus. Right upper extremity PICC. Catheter tip overlies the cavoatrial junction. Stable cardiomegaly. Persistent diffuse interstitial and airspace opacities with areas of greater conspicuity scattered bilaterally. There has been some interval clearing of the right upper lung compared to prior. Probable small bilateral pleural effusions. No acute osseous abnormality. IMPRESSION: Overall, slight interval improvement in aeration  in the right upper lung. Otherwise, stable appearance of the chest. Stable and satisfactory support apparatus. Electronically Signed   By: Malachy MoanHeath  McCullough M.D.   On: 06/30/2022 07:09   US EKG SITE RITE  Result Date: 06/29/2022 If Site Rite image not attached, placement could not be confirmed due to current cardiac rhythm.  MR BRAIN WO CONTRAST  Result Date: 06/29/2022 CLINICAL DATA:  Mental status change with unknown cause. EXAM: MRI HEAD WITHOUT CONTRAST TECHNIQUE: Multiplanar, multiecho pulse sequences of the brain and surrounding structures were obtained without intravenous contrast. COMPARISON:  Head CT and CTA from yesterday FINDINGS: Brain: No acute infarction, hemorrhage, hydrocephalus, extra-axial collection or mass lesion. Chronic small vessel ischemia in the cerebral white matter, overall mild. 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 Cerebral volume loss with ventriculomegaly. Vascular: Normal flow voids. Skull and upper cervical spine: Normal marrow signal. Cervical spine degeneration  and multilevel ankylosis. Sinuses/Orbits: Negative. IMPRESSION: 1. No acute or reversible finding. 2. Brain atrophy, chronic small vessel ischemia, and small remote right parietal cortex infarct. Electronically Signed   By: Jorje Guild M.D.   On: 06/29/2022 15:24    Assessment/Plan  AKI: likely multifactorial in the setting of sepsis/ CTA/ low Bps/ pressor requirements.  Vanc level supratherapuetic today at 67  Would be unusual for antibiotic spacer to provide enough systemic absorption to cause AKI  - no hard indication for RRT at present  - hopefully we are seeing rate of rise decrease  - add midodrine to augment BP  - does not appear from notes taht pt would want dialysis- continue dialogue, ? Pall care c/s  2.  Acute hypoxic RF  - intubated  - on meropenem/ vanc/ azithro  3.  R hip infection  - s/p spacer with ortho 06/29/22  4.  Anemia:  - transfuse prn  5.  Dispo:  ICU    Raeanna Soberanes, Benjamine Mola 07/01/2022, 1:30 PM

## 2022-07-01 NOTE — Progress Notes (Signed)
Nutrition Follow-up  DOCUMENTATION CODES:   Non-severe (moderate) malnutrition in context of chronic illness  INTERVENTION:  - Per CCM, can start advancing TF towards goal today.  Vital 1.5 at 55 ml/h (1320 ml per day) *Start at 27mL/hr and advance by 65mL Q8H to goal once medically appropriate Provides 1980 kcal, 89 gm protein, 1008 ml free water daily   - Monitor magnesium, potassium, and phosphorus for at least 3 days, MD to replete as needed. - Monitor weight trend   NUTRITION DIAGNOSIS:   Moderate Malnutrition related to chronic illness as evidenced by moderate muscle depletion, moderate fat depletion. *ongoing  GOAL:   Patient will meet greater than or equal to 90% of their needs *not being met, advancing TF today  MONITOR:   PO intake, Supplement acceptance, Weight trends  REASON FOR ASSESSMENT:   Consult Assessment of nutrition requirement/status  ASSESSMENT:   88 y.o. male with past medical history of atrial fibrillation, hyperlipidemia, hypertension, and recnet total hip arthroplasty with antibiotic spacer (06/06/2022) who presented to hospital with weakness swelling in his arms and legs over the last few days  1/9 Admit 1/12 increased oxygen requirement and lethargic, transferred to ICU, Vit 1.5 at 40mL/hr started 1/13 ortho hip procedure, remained intubated post op 1/15 starting advancements towards goal TF  Patient remains intubated and sedated.  TF running at 51mL/hr, no noted intolerances.   Per discussion with CCM MD, can start advancing TF today. Recommending to advance by 50mL Q8H to goal of 94mL/hr. Plan discussed with RN.   Patient's weight continues to trend up. He is noted to be +6.8L which is likely affecting weight status and elevating current weight.  Will continue to monitor weight trends.  Admit weight: 67.1kg Current weight: 74.2kg   Medications reviewed and include: 2000 units vitamin D, Colace, Insulin, MVI, Miralax, 220mg   zinc Propofol @ 6.17mL/hr (172 kcals) Fentanyl  Labs reviewed:  Creatinine 2.92    Diet Order:   Diet Order             Diet NPO time specified  Diet effective ____                   EDUCATION NEEDS:  Education needs have been addressed  Skin:  Skin Assessment: Reviewed RN Assessment Skin Integrity Issues:: Stage I Stage I: Mid Buttocks  Last BM:  1/14  Height:  Ht Readings from Last 1 Encounters:  06/25/22 5\' 8"  (1.727 m)   Weight:  Wt Readings from Last 1 Encounters:  07/01/22 74.2 kg   Ideal Body Weight:  70 kg  BMI:  Body mass index is 24.87 kg/m.  Estimated Nutritional Needs:  Kcal:  1800-2000 kcals Protein:  85-100 grams Fluid:  >/= 1.8L    Travis Wolfe RD, LDN For contact information, refer to [facility].

## 2022-07-01 NOTE — Progress Notes (Signed)
NAME:  Travis Wolfe, MRN:  [id number], DOB:  1934/10/28, LOS: 6 ADMISSION DATE:  06/25/2022, CONSULTATION DATE:  06/28/22 REFERRING MD:  Flora Lipps, MD CHIEF COMPLAINT:  respiratory failure   History of Present Illness:  Travis Wolfe is an 88 year old male with history of atrial fibrillation on eliquis, hypertension and complex surgical history of the right hip with recent arthroplasty resection with placement of antibiotic spacer and started on daptomycin and ceftriaxone 06/07/22, who was admitted 06/25/22 from SNF with weakness and drop in hemoglobin. He was started on antibiotics for multifocal pneumonia based on chest radiograph on admission.  06/28/21 code stroke was called due to altered mentation and inability to move his upper extremities along with facial asymmetry. CTA head was negative for stroke.   He was more alert and responsive at time of evaluation in the ICU. His wife was at the bedside.   Orthopedics was planning return to OR on 1/13 but has post-poned that procedure at this time.   Pertinent  Medical History   Atrial Fibrillation Hypertension Mitral regurgitation  Significant Hospital Events: Including procedures, antibiotic start and stop dates in addition to other pertinent events   1/9 admitted to [facility] for weakness and anemia. Concern for pneumonia.  1/12 transferred to ICU for code stroke and increasing O2 needs 1/13 remained intubated after ortho hip proceudre, bronchoscopy and PICC line place. 1/14 a-line place, weaned off vasopressors   Interim History / Subjective:   No UOP overnight Lactic acid has returned to normal He is off vasopressors Remains on sedation due to breathing over the vent  Held discussion with patient's sister and son. They are interested in CRRT. Will plan to discuss with patient's wife and entire family at once.   Objective   Blood pressure (!) 153/26, pulse 65, temperature 98 F (36.7 C), temperature source Axillary, resp.  rate (!) 31, height 5' 8"  (1.727 m), weight 74.2 kg, SpO2 93 %.    Vent Mode: PRVC FiO2 (%):  [40 %] 40 % Set Rate:  [22 bmp] 22 bmp Vt Set:  [410 mL-480 mL] 480 mL PEEP:  [5 cmH20] 5 cmH20 Plateau Pressure:  [19 cmH20-20 cmH20] 20 cmH20   Intake/Output Summary (Last 24 hours) at 07/01/2022 0737 Last data filed at 07/01/2022 0200 Gross per 24 hour  Intake 2900.16 ml  Output 11 ml  Net 2889.16 ml   Filed Weights   06/27/22 0500 06/28/22 0505 07/01/22 0500  Weight: 72.5 kg 72.6 kg 74.2 kg    Examination: General: elderly male, ill appearing, intubated HENT: AT/[place], moist mucous membranes, sclera anicteric Lungs: course breath sounds, no wheezing Cardiovascular: rrr, no murmurs Abdomen: soft, non-tender, non-distended, BS+ Extremities: warm, 1+ edema Neuro: A&Ox3 but slow to respond, unable to move RLE due to hip issues,  GU: external foley   Echo 06/26/22 LV EF 71% RV systolic function and size are normal, but noted to have elevated pulmonary pressures of 21mHg.   Abdominal [place]orea1/14/24 Cholelithiasis and biliary sludge without definitive sonographic evidence of acute cholecystitis. Small volume free fluid.  Labs Cr 2.92 K 4.3 LA 1.6 Hbg 8.5 g/dL  Resolved Hospital Problem list   Anion Gap Metabolic Acidosis Lactic Acidosis Hyperkalemia  Assessment & Plan:  Acute Hypoxemic Respiratory Failure Multifocal Pneumonia ?Daptomycin induced eosinophilic pneumonia - stopped daptomycin 1/12 - reduce solumedrol to 479mdaily from BID today, steroids started 1/12 - HCAP coverage with cefepime then zosyn, changed to meropenem 1/14 - Continue vanc and azithromycin - F/u  BAL cultures - f/u fungitell and urine legionella - viral testing is negative  Septic Shock due to Pneumonia vs other source - blood cultures no growth to date - f/u BAL cultures - meropenem, vanc and azithro for antibiotic coverage - MAP goal 65 or higher. Currently off vasopressors  Acute Kidney  Injury, Anuric - avoid nephrotoxic agents - Dicussed with wife, patient would not want dialysis - Will consult Nephrology for any further recommendations. Patient's sister and son would like CRRT to be considered.   Acute Encephalopathy - monitor neuro status closely - MRI brain 1/13 negative for stroke or hemorrhage - cefepime stopped 1/12 - stopped baclofen 1/13 - reduced steroid dose 1/15   NSTEMI Pulmonary Hypertension Paroxysmal Atrial Fibrillation Hx of hypertension - elevated troponin and inverted T waves in lateral leads - started heparin/asa 1/13, stoppded 1/14 due to drop in hemoglobin and increase in LA  Anemia ?GI bleeding - continue PPI BID IV - no further reports of bloody BMs - trending H/H  S/p Right hip arthroplasty resection with antibiotic spacer Right Hip Seroma - OR 1/13 for removal of antibiotic spacer for comfort  Anasarca Moderate Protein Calorie Malnutrition - doboff placed, continue trickle tube feeds   Best Practice (right click and "Reselect all SmartList Selections" daily)   Diet/type: tubefeeds DVT prophylaxis: prophylactic heparin  GI prophylaxis: PPI Lines: Central line and Arterial Line Foley:  Yes, and it is still needed Code Status:  DNR Last date of multidisciplinary goals of care discussion [Discussed with patient's wife at bedside 1/14]  Labs   CBC: Recent Labs  Lab 06/25/22 0957 06/25/22 1831 06/28/22 1200 06/29/22 0418 06/30/22 0515 06/30/22 1455 06/30/22 2100 07/01/22 0550  WBC 7.5   < > 6.1 7.0 12.7* 8.4 9.6 11.6*  NEUTROABS 6.5  --  5.5 6.2  --  7.0  --   --   HGB 5.9*   < > 11.3* 9.6* 7.9* 9.0* 8.7* 8.5*  HCT 18.8*   < > 35.6* 31.2* 27.2* 28.9* 27.4* 25.9*  MCV 102.7*   < > 94.4 96.3 102.3* 95.4 92.9 89.9  PLT 249   < > 260 256 307 153 128* 135*   < > = values in this interval not displayed.    Basic Metabolic Panel: Recent Labs  Lab 06/27/22 0552 06/28/22 0155 06/28/22 1430 06/29/22 0418 06/29/22 1342  06/30/22 0515 06/30/22 1455 07/01/22 0550  NA 139 137 137 140 140 138 138 137  K 3.3* 3.2* 4.4 3.9 4.6 5.4* 4.6 4.3  CL 106 106 104 108 107 101 102 101  CO2 26 21* 24 23 21* 17* 19* 24  GLUCOSE 109* 137* 136* 167* 198* 196* 232* 142*  BUN 23 27* 36* 41* 46* 59* 67* 84*  CREATININE 0.84 1.03 0.88 1.26* 1.48* 2.29* 2.49* 2.92*  CALCIUM 8.1* 8.1* 8.1* 8.2* 8.7* 8.9 8.3* 8.7*  MG 2.0 1.9 3.0*  --   --  3.1*  --   --   PHOS  --   --  3.8  --   --  8.1*  --   --    GFR: Estimated Creatinine Clearance: 17.9 mL/min (A) (by C-G formula based on SCr of 2.92 mg/dL (H)). Recent Labs  Lab 06/28/22 1126 06/28/22 1147 06/29/22 0439 06/29/22 1552 06/29/22 1844 06/30/22 0515 06/30/22 1455 06/30/22 1735 06/30/22 2100 07/01/22 0550  PROCALCITON 7.46  --  7.74  --   --   --   --   --   --   --  WBC  --    < >  --   --   --  12.7* 8.4  --  9.6 11.6*  LATICACIDVEN  --    < >  --    < > 2.5* 6.6* 5.2* 3.7*  --   --    < > = values in this interval not displayed.    Liver Function Tests: Recent Labs  Lab 06/26/22 0415 06/29/22 0418 06/29/22 1342 06/30/22 0515 07/01/22 0550  AST 39 44* 49* 213* 310*  ALT 22 30 28  87* 211*  ALKPHOS 68 69 80 62 65  BILITOT 1.2 1.4* 1.1 1.6* 2.1*  PROT 5.5* 5.2* 6.1* 6.7 5.1*  ALBUMIN 1.7* 1.7* 2.4* 3.4* 2.9*   No results for input(s): "LIPASE", "AMYLASE" in the last 168 hours. No results for input(s): "AMMONIA" in the last 168 hours.  ABG    Component Value Date/Time   PHART 7.29 (L) 06/30/2022 1344   PCO2ART 36 06/30/2022 1344   PO2ART 79 (L) 06/30/2022 1344   HCO3 17.3 (L) 06/30/2022 1344   TCO2 24 06/06/2022 1213   ACIDBASEDEF 8.5 (H) 06/30/2022 1344   O2SAT 97.6 06/30/2022 1344     Coagulation Profile: Recent Labs  Lab 06/25/22 0957  INR 1.7*    Cardiac Enzymes: No results for input(s): "CKTOTAL", "CKMB", "CKMBINDEX", "TROPONINI" in the last 168 hours.  HbA1C: Hgb A1c MFr Bld  Date/Time Value Ref Range Status  06/30/2022 05:15  AM 4.8 4.8 - 5.6 % Final    Comment:    (NOTE) Pre diabetes:          5.7%-6.4%  Diabetes:              >6.4%  Glycemic control for   <7.0% adults with diabetes     CBG: Recent Labs  Lab 06/30/22 1120 06/30/22 1706 06/30/22 1949 06/30/22 2343 07/01/22 0326  GLUCAP 246* 246* 157* 153* 140*     Critical care time:  85 minutes    Freda Jackson, MD [facility] Office: [phone]   See Amion for personal pager PCCM on call pager [phone] until 7pm. Please call Elink 7p-7a. [phone]

## 2022-07-01 NOTE — Progress Notes (Signed)
Brief vancomycin dose:  See note  on 1/14 From Anh Pham Pharm D for full details.  A/P: Random vanc level drawn at 0040 on 1/15= 40 Continue to hold vancomycin, obtain random level in another 24-48 hours  Dolly Rias RPh 07/01/2022, 2:28 AM

## 2022-07-01 NOTE — Progress Notes (Signed)
SLP Cancellation Note  Patient Details Name: Travis Wolfe MRN: [id number] DOB: 1935/02/07   Cancelled treatment:       Reason Eval/Treat Not Completed: Medical issues which prohibited therapy;Other (comment) (patient remains on vent since 1/13 surgery. SLP will continue to follow.)   Sonia Baller, MA, CCC-SLP Speech Therapy

## 2022-07-01 NOTE — Progress Notes (Signed)
ABG completed , sent to lab

## 2022-07-01 NOTE — Plan of Care (Signed)
  Problem: Education: Goal: Knowledge of the prescribed therapeutic regimen will improve Outcome: Progressing Goal: Understanding of discharge needs will improve Outcome: Progressing Goal: Individualized Educational Video(s) Outcome: Progressing   Problem: Activity: Goal: Ability to avoid complications of mobility impairment will improve Outcome: Progressing Goal: Ability to tolerate increased activity will improve Outcome: Progressing   Problem: Clinical Measurements: Goal: Postoperative complications will be avoided or minimized Outcome: Progressing   Problem: Pain Management: Goal: Pain level will decrease with appropriate interventions Outcome: Progressing   Problem: Skin Integrity: Goal: Will show signs of wound healing Outcome: Progressing   Problem: Education: Goal: Ability to demonstrate management of disease process will improve Outcome: Progressing Goal: Ability to verbalize understanding of medication therapies will improve Outcome: Progressing Goal: Individualized Educational Video(s) Outcome: Progressing   Problem: Activity: Goal: Capacity to carry out activities will improve Outcome: Progressing   Problem: Cardiac: Goal: Ability to achieve and maintain adequate cardiopulmonary perfusion will improve Outcome: Progressing   Problem: Education: Goal: Knowledge of General Education information will improve Description: Including pain rating scale, medication(s)/side effects and non-pharmacologic comfort measures Outcome: Progressing   Problem: Health Behavior/Discharge Planning: Goal: Ability to manage health-related needs will improve Outcome: Progressing   Problem: Clinical Measurements: Goal: Ability to maintain clinical measurements within normal limits will improve Outcome: Progressing Goal: Will remain free from infection Outcome: Progressing Goal: Diagnostic test results will improve Outcome: Progressing Goal: Respiratory complications will  improve Outcome: Progressing Goal: Cardiovascular complication will be avoided Outcome: Progressing   Problem: Activity: Goal: Risk for activity intolerance will decrease Outcome: Progressing   Problem: Nutrition: Goal: Adequate nutrition will be maintained Outcome: Progressing   Problem: Coping: Goal: Level of anxiety will decrease Outcome: Progressing   Problem: Elimination: Goal: Will not experience complications related to bowel motility Outcome: Progressing Goal: Will not experience complications related to urinary retention Outcome: Progressing   Problem: Pain Managment: Goal: General experience of comfort will improve Outcome: Progressing   Problem: Safety: Goal: Ability to remain free from injury will improve Outcome: Progressing   Problem: Skin Integrity: Goal: Risk for impaired skin integrity will decrease Outcome: Progressing   Problem: Education: Goal: Ability to describe self-care measures that may prevent or decrease complications (Diabetes Survival Skills Education) will improve Outcome: Progressing Goal: Individualized Educational Video(s) Outcome: Progressing   Problem: Coping: Goal: Ability to adjust to condition or change in health will improve Outcome: Progressing   Problem: Fluid Volume: Goal: Ability to maintain a balanced intake and output will improve Outcome: Progressing   Problem: Health Behavior/Discharge Planning: Goal: Ability to identify and utilize available resources and services will improve Outcome: Progressing Goal: Ability to manage health-related needs will improve Outcome: Progressing   Problem: Metabolic: Goal: Ability to maintain appropriate glucose levels will improve Outcome: Progressing   Problem: Nutritional: Goal: Maintenance of adequate nutrition will improve Outcome: Progressing Goal: Progress toward achieving an optimal weight will improve Outcome: Progressing   Problem: Skin Integrity: Goal: Risk for  impaired skin integrity will decrease Outcome: Progressing   Problem: Tissue Perfusion: Goal: Adequacy of tissue perfusion will improve Outcome: Progressing

## 2022-07-02 ENCOUNTER — Inpatient Hospital Stay (HOSPITAL_COMMUNITY): Payer: Medicare PPO

## 2022-07-02 ENCOUNTER — Encounter (HOSPITAL_COMMUNITY): Payer: Self-pay | Admitting: Orthopedic Surgery

## 2022-07-02 DIAGNOSIS — D649 Anemia, unspecified: Secondary | ICD-10-CM | POA: Diagnosis not present

## 2022-07-02 LAB — CBC WITH DIFFERENTIAL/PLATELET
Abs Immature Granulocytes: 0.28 10*3/uL — ABNORMAL HIGH (ref 0.00–0.07)
Basophils Absolute: 0 10*3/uL (ref 0.0–0.1)
Basophils Relative: 0 %
Eosinophils Absolute: 0 10*3/uL (ref 0.0–0.5)
Eosinophils Relative: 0 %
HCT: 28.4 % — ABNORMAL LOW (ref 39.0–52.0)
Hemoglobin: 9.2 g/dL — ABNORMAL LOW (ref 13.0–17.0)
Immature Granulocytes: 2 %
Lymphocytes Relative: 5 %
Lymphs Abs: 0.7 10*3/uL (ref 0.7–4.0)
MCH: 29.2 pg (ref 26.0–34.0)
MCHC: 32.4 g/dL (ref 30.0–36.0)
MCV: 90.2 fL (ref 80.0–100.0)
Monocytes Absolute: 0.5 10*3/uL (ref 0.1–1.0)
Monocytes Relative: 4 %
Neutro Abs: 12.6 10*3/uL — ABNORMAL HIGH (ref 1.7–7.7)
Neutrophils Relative %: 89 %
Platelets: 148 10*3/uL — ABNORMAL LOW (ref 150–400)
RBC: 3.15 MIL/uL — ABNORMAL LOW (ref 4.22–5.81)
RDW: 19 % — ABNORMAL HIGH (ref 11.5–15.5)
WBC: 14.1 10*3/uL — ABNORMAL HIGH (ref 4.0–10.5)
nRBC: 0.7 % — ABNORMAL HIGH (ref 0.0–0.2)

## 2022-07-02 LAB — COMPREHENSIVE METABOLIC PANEL
ALT: 150 U/L — ABNORMAL HIGH (ref 0–44)
AST: 125 U/L — ABNORMAL HIGH (ref 15–41)
Albumin: 2.5 g/dL — ABNORMAL LOW (ref 3.5–5.0)
Alkaline Phosphatase: 74 U/L (ref 38–126)
Anion gap: 13 (ref 5–15)
BUN: 96 mg/dL — ABNORMAL HIGH (ref 8–23)
CO2: 19 mmol/L — ABNORMAL LOW (ref 22–32)
Calcium: 8.4 mg/dL — ABNORMAL LOW (ref 8.9–10.3)
Chloride: 103 mmol/L (ref 98–111)
Creatinine, Ser: 3.55 mg/dL — ABNORMAL HIGH (ref 0.61–1.24)
GFR, Estimated: 16 mL/min — ABNORMAL LOW (ref >60)
Glucose, Bld: 158 mg/dL — ABNORMAL HIGH (ref 70–99)
Potassium: 4.8 mmol/L (ref 3.5–5.1)
Sodium: 135 mmol/L (ref 135–145)
Total Bilirubin: 1.5 mg/dL — ABNORMAL HIGH (ref 0.3–1.2)
Total Protein: 5.1 g/dL — ABNORMAL LOW (ref 6.5–8.1)

## 2022-07-02 LAB — PHOSPHORUS: Phosphorus: 4.3 mg/dL (ref 2.5–4.6)

## 2022-07-02 LAB — CULTURE, RESPIRATORY W GRAM STAIN: Culture: NORMAL

## 2022-07-02 LAB — BLOOD GAS, ARTERIAL
Acid-base deficit: 2.6 mmol/L — ABNORMAL HIGH (ref 0.0–2.0)
Bicarbonate: 20.2 mmol/L (ref 20.0–28.0)
O2 Saturation: 98.1 %
Patient temperature: 37
pCO2 arterial: 29 mmHg — ABNORMAL LOW (ref 32–48)
pH, Arterial: 7.45 (ref 7.35–7.45)
pO2, Arterial: 85 mmHg (ref 83–108)

## 2022-07-02 LAB — CYTOLOGY - NON PAP

## 2022-07-02 LAB — ACID FAST SMEAR (AFB, MYCOBACTERIA): Acid Fast Smear: NEGATIVE

## 2022-07-02 LAB — GLUCOSE, CAPILLARY
Glucose-Capillary: 132 mg/dL — ABNORMAL HIGH (ref 70–99)
Glucose-Capillary: 139 mg/dL — ABNORMAL HIGH (ref 70–99)
Glucose-Capillary: 142 mg/dL — ABNORMAL HIGH (ref 70–99)
Glucose-Capillary: 143 mg/dL — ABNORMAL HIGH (ref 70–99)
Glucose-Capillary: 147 mg/dL — ABNORMAL HIGH (ref 70–99)
Glucose-Capillary: 148 mg/dL — ABNORMAL HIGH (ref 70–99)

## 2022-07-02 LAB — MAGNESIUM: Magnesium: 3 mg/dL — ABNORMAL HIGH (ref 1.7–2.4)

## 2022-07-02 MED ORDER — SODIUM BICARBONATE 650 MG PO TABS
650.0000 mg | ORAL_TABLET | Freq: Three times a day (TID) | ORAL | Status: DC
  Administered 2022-07-02 – 2022-07-08 (×19): 650 mg
  Filled 2022-07-02 (×19): qty 1

## 2022-07-02 MED ORDER — PANTOPRAZOLE SODIUM 40 MG IV SOLR
40.0000 mg | INTRAVENOUS | Status: DC
  Administered 2022-07-02 – 2022-07-03 (×2): 40 mg via INTRAVENOUS
  Filled 2022-07-02 (×2): qty 10

## 2022-07-02 NOTE — Progress Notes (Signed)
Pt transported to and from CT with RN at bedside.  Pt tolerated well.

## 2022-07-02 NOTE — Progress Notes (Signed)
   Subjective: 3 Days Post-Op Procedure(s) (LRB): TOTAL HIP REVISION posterior approach (Right)   Patient intubated in the ICU. RN tells me his family went home for the night.   Objective: Vital signs in last 24 hours: Temp:  [97.5 F (36.4 C)-98.5 F (36.9 C)] 97.5 F (36.4 C) (01/16 0416) Pulse Rate:  [52-79] 57 (01/16 0700) Resp:  [17-30] 17 (01/16 0700) SpO2:  [95 %-100 %] 97 % (01/16 0700) Arterial Line BP: (104-154)/(40-51) 146/47 (01/16 0700) FiO2 (%):  [40 %] 40 % (01/16 0452) Weight:  [69.9 kg] 69.9 kg (01/16 0215)  Intake/Output from previous day:  Intake/Output Summary (Last 24 hours) at 07/02/2022 0707 Last data filed at 07/02/2022 0700 Gross per 24 hour  Intake 1900.9 ml  Output 99 ml  Net 1801.9 ml     Intake/Output this shift: No intake/output data recorded.  Labs: Recent Labs    06/30/22 1455 06/30/22 2100 07/01/22 0550 07/01/22 0800 07/02/22 0407  HGB 9.0* 8.7* 8.5* 8.5* 9.2*   Recent Labs    07/01/22 0800 07/02/22 0407  WBC 11.9* 14.1*  RBC 2.85* 3.15*  HCT 26.2* 28.4*  PLT 137* 148*   Recent Labs    07/01/22 0550 07/02/22 0407  NA 137 135  K 4.3 4.8  CL 101 103  CO2 24 19*  BUN 84* 96*  CREATININE 2.92* 3.55*  GLUCOSE 142* 158*  CALCIUM 8.7* 8.4*   Recent Labs    07/01/22 0816  INR 1.7*    Exam: General - Patient is intubated.  Extremity - Deferred exam due to current state. Dressing - C/D/I   Past Medical History:  Diagnosis Date   A-fib ([place])    REPORTS WAS DUE TO DEHYDRATION 3 YEARS AGO BUT WAS GIVEN FLUIDS IN ED AND MEDS TO SLOW HR AND DROVE HIMSELF HOME  ; HAD ABLATION X2 WITH CARDIO DR. AKBARY AS PRECAUTION  ; has been on eliquis since but reports he has not taken it for 5 weeks b/c he was told to hold it for the multiple irrigations and debridements for the hip infection. reports his cardiologist is aware he has been holding it;    Arthritis    Atherosclerosis of both carotid arteries    BPH (benign prostatic  hyperplasia)    Dysrhythmia    a-fib   Fall    fell on friday 01-16-18;   lost balAnce whIile tryng to get his sock on ;sustained skin tear to left wrist ; no drainage  , scabbed over , and has been covering with bandage    Heart murmur    SINCE HIS 20s   HLD (hyperlipidemia)    Hypertension    Non-rheumatic mitral regurgitation     Assessment/Plan: 3 Days Post-Op Procedure(s) (LRB): TOTAL HIP REVISION posterior approach (Right) Principal Problem:   Symptomatic anemia Active Problems:   PAF (paroxysmal atrial fibrillation) (HCC)   Hematoma of hip, right, sequela   Anasarca   HTN (hypertension)   HLD (hyperlipidemia)   Multifocal pneumonia   Hypocalcemia   Malnutrition of moderate degree  Estimated body mass index is 23.43 kg/m as calculated from the following:   Height as of this encounter: 5\' 8"  (1.727 m).   Weight as of this encounter: 69.9 kg.   Dr. Alvan Dame and I will follow along and remain available to help in any way.  Please reach out with any needs.   Griffith Citron, PA-C Orthopedic Surgery [phone] 07/02/2022, 7:07 AM

## 2022-07-02 NOTE — Progress Notes (Signed)
NAME:  Travis Wolfe, MRN:  [id number], DOB:  [birth year], LOS: 7 ADMISSION DATE:  06/25/2022, CONSULTATION DATE:  06/28/22 REFERRING MD:  Flora Lipps, MD CHIEF COMPLAINT:  respiratory failure   History of Present Illness:  Travis Wolfe is an 88 year old male with history of atrial fibrillation on eliquis, hypertension and complex surgical history of the right hip with recent arthroplasty resection with placement of antibiotic spacer and started on daptomycin and ceftriaxone 06/07/22, who was admitted 06/25/22 from SNF with weakness and drop in hemoglobin. He was started on antibiotics for multifocal pneumonia based on chest radiograph on admission.  06/28/21 code stroke was called due to altered mentation and inability to move his upper extremities along with facial asymmetry. CTA head was negative for stroke.   He was more alert and responsive at time of evaluation in the ICU. His wife was at the bedside.   Orthopedics was planning return to OR on 1/13 but has post-poned that procedure at this time.   Pertinent  Medical History   Atrial Fibrillation Hypertension Mitral regurgitation  Significant Hospital Events: Including procedures, antibiotic start and stop dates in addition to other pertinent events   1/9 admitted to [facility] for weakness and anemia. Concern for pneumonia.  1/12 transferred to ICU for code stroke and increasing O2 needs 1/13 remained intubated after ortho hip proceudre, bronchoscopy and PICC line place. 1/14 a-line place, weaned off vasopressors  1/15 remains intubated, poor mental status, family meeting held  Interim History / Subjective:   He remains off vasopressors No acute events overnight Updated wife at bedside  Objective   Blood pressure (!) 153/26, pulse (!) 57, temperature (!) 97.5 F (36.4 C), temperature source Oral, resp. rate 17, height 5' 8"  (1.727 m), weight 69.9 kg, SpO2 97 %.    Vent Mode: PRVC FiO2 (%):  [40 %] 40 % Set Rate:  [22 bmp]  22 bmp Vt Set:  [480 mL] 480 mL PEEP:  [5 cmH20] 5 cmH20 Plateau Pressure:  [16 cmH20-17 cmH20] 16 cmH20   Intake/Output Summary (Last 24 hours) at 07/02/2022 0726 Last data filed at 07/02/2022 0700 Gross per 24 hour  Intake 1900.9 ml  Output 99 ml  Net 1801.9 ml   Filed Weights   06/28/22 0505 07/01/22 0500 07/02/22 0215  Weight: 72.6 kg 74.2 kg 69.9 kg    Examination: General: elderly male, ill appearing, intubated HENT: AT/[place], moist mucous membranes, sclera anicteric Lungs: course breath sounds, no wheezing Cardiovascular: rrr, no murmurs Abdomen: soft, non-tender, non-distended, BS+ Extremities: warm, 1+ edema Neuro: sedated, does not wake to verbal stimuli, responds to painful stimuli, Left pupil 49m reactive to light, right pupil 362mreactive to light GU: foley   Echo 06/26/22 LV EF 5523%V systolic function and size are normal, but noted to have elevated pulmonary pressures of 7023m.   Abdominal US [place]14/24 Cholelithiasis and biliary sludge without definitive sonographic evidence of acute cholecystitis. Small volume free fluid.  Labs Cr 3.55 BUN 96 K 4.8 Hbg 9.2 g/dL  Resolved Hospital Problem list   Anion Gap Metabolic Acidosis Lactic Acidosis Hyperkalemia Shock  Assessment & Plan:  Acute Hypoxemic Respiratory Failure Multifocal Pneumonia ?Daptomycin induced eosinophilic pneumonia - stopped daptomycin 1/12 - continue solumedrol to 7m10mily, steroids started 1/12 - HCAP coverage with cefepime then zosyn, changed to meropenem 1/14 - Continue vanc and azithromycin - F/u BAL cultures - f/u fungitell and urine legionella - viral testing is negative - Increased RR on PSV trial this morning  Septic due to Pneumonia vs other source - blood cultures no growth to date - f/u BAL cultures - meropenem, vanc and azithro for antibiotic coverage - MAP goal 65 or higher. Currently off vasopressors  Acute Kidney Injury, Anuric - avoid nephrotoxic agents -  Nephrology following, midodrine added 1/15 - No urgent indications for CRRT.  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 Right Hip Seroma - OR 1/13 for removal of antibiotic spacer for comfort  Anasarca Moderate Protein Calorie Malnutrition - doboff placed, continue trickle tube feeds   Best Practice (right click and "Reselect all SmartList Selections" daily)   Diet/type: tubefeeds DVT prophylaxis: prophylactic heparin  GI prophylaxis: PPI Lines: Central line and Arterial Line Foley:  Yes, and it is still needed Code Status:  DNR Last date of multidisciplinary goals of care discussion [Discussed with patient's wife at bedside 1/14]  Labs   CBC: Recent Labs  Lab 06/25/22 0957 06/25/22 1831 06/28/22 1200 06/29/22 0418 06/30/22 0515 06/30/22 1455 06/30/22 2100 07/01/22 0550 07/01/22 0800 07/02/22 0407  WBC 7.5   < > 6.1 7.0   < > 8.4 9.6 11.6* 11.9* 14.1*  NEUTROABS 6.5  --  5.5 6.2  --  7.0  --   --   --  12.6*  HGB 5.9*   < > 11.3* 9.6*   < > 9.0* 8.7* 8.5* 8.5* 9.2*  HCT 18.8*   < > 35.6* 31.2*   < > 28.9* 27.4* 25.9* 26.2* 28.4*  MCV 102.7*   < > 94.4 96.3   < > 95.4 92.9 89.9 91.9 90.2  PLT 249   < > 260 256   < > 153 128* 135* 137* 148*   < > = values in this interval not displayed.    Basic Metabolic Panel: Recent Labs  Lab 06/27/22 0552  06/28/22 0155 06/28/22 1430 06/29/22 0418 06/29/22 1342 06/30/22 0515 06/30/22 1455 07/01/22 0550 07/02/22 0407  NA 139 137 137   < > 140 138 138 137 135  K 3.3* 3.2* 4.4   < > 4.6 5.4* 4.6 4.3 4.8  CL 106 106 104   < > 107 101 102 101 103  CO2 26 21* 24   < > 21* 17* 19* 24 19*  GLUCOSE 109* 137* 136*   < > 198* 196* 232* 142* 158*  BUN 23 27* 36*   < > 46* 59* 67* 84* 96*  CREATININE 0.84 1.03 0.88   < > 1.48* 2.29* 2.49* 2.92* 3.55*  CALCIUM 8.1* 8.1* 8.1*   < > 8.7* 8.9 8.3* 8.7* 8.4*  MG 2.0 1.9 3.0*  --   --  3.1*  --   --  3.0*  PHOS  --   --  3.8  --   --  8.1*  --   --  4.3   < > = values in this interval not  displayed.   GFR: Estimated Creatinine Clearance: 14.7 mL/min (A) (by C-G formula based on SCr of 3.55 mg/dL (H)). Recent Labs  Lab 06/28/22 1126 06/28/22 1147 06/29/22 0439 06/29/22 1552 06/30/22 0515 06/30/22 1455 06/30/22 1735 06/30/22 2100 07/01/22 0550 07/01/22 0800 07/02/22 0407  PROCALCITON 7.46  --  7.74  --   --   --   --   --   --   --   --   WBC  --    < >  --   --  12.7* 8.4  --  9.6 11.6* 11.9* 14.1*  LATICACIDVEN  --    < >  --    < > 6.6* 5.2* 3.7*  --   --  1.6  --    < > = values in this interval not displayed.    Liver Function Tests: Recent Labs  Lab 06/29/22 0418 06/29/22 1342 06/30/22 0515 07/01/22 0550 07/02/22 0407  AST 44* 49* 213* 310* 125*  ALT 30 28 87* 211* 150*  ALKPHOS 69 80 62 65 74  BILITOT 1.4* 1.1 1.6* 2.1* 1.5*  PROT 5.2* 6.1* 6.7 5.1* 5.1*  ALBUMIN 1.7* 2.4* 3.4* 2.9* 2.5*   No results for input(s): "LIPASE", "AMYLASE" in the last 168 hours. No results for input(s): "AMMONIA" in the last 168 hours.  ABG    Component Value Date/Time   PHART 7.46 (H) 07/01/2022 0853   PCO2ART 37 07/01/2022 0853   PO2ART 84 07/01/2022 0853   HCO3 26.3 07/01/2022 0853   TCO2 24 06/06/2022 1213   ACIDBASEDEF 8.5 (H) 06/30/2022 1344   O2SAT 98.8 07/01/2022 0853     Coagulation Profile: Recent Labs  Lab 06/25/22 0957  07/01/22 0816  INR 1.7* 1.7*    Cardiac Enzymes: No results for input(s): "CKTOTAL", "CKMB", "CKMBINDEX", "TROPONINI" in the last 168 hours.  HbA1C: Hgb A1c MFr Bld  Date/Time Value Ref Range Status  06/30/2022 05:15 AM 4.8 4.8 - 5.6 % Final    Comment:    (NOTE) Pre diabetes:          5.7%-6.4%  Diabetes:              >6.4%  Glycemic control for   <7.0% adults with diabetes     CBG: Recent Labs  Lab 07/01/22 1240 07/01/22 1638 07/01/22 1948 07/02/22 0012 07/02/22 0328  GLUCAP 134* 142* 127* 142* 143*     Critical care time:  45 minutes    Freda Jackson, MD [facility] Office: [phone]   See Amion for personal pager PCCM on call pager [phone] until 7pm. Please call Elink 7p-7a. [phone]

## 2022-07-02 NOTE — Progress Notes (Signed)
[facility] Progress Note   Assessment/ Plan:    AKI: likely multifactorial in the setting of sepsis/ CTA/ low Bps/ pressor requirements.  Vanc level supratherapuetic today at 48  Would be unusual for antibiotic spacer to provide enough systemic absorption to cause AKI             - no hard indication for RRT at present             - added midodrine yesterday             - long discussion with Christine (POA) today- pt had expressed to her multiple times no dialysis  - CRRT unlikely to restore pt to baseline level of health in setting of functional decline and now critical illness with MSOF  - palliative consulted- appreciate assistance   2.  Acute hypoxic RF             - intubated             - on meropenem/ vanc/ azithro   3.  R hip infection             - s/p spacer with ortho 06/29/22   4.  Anemia:             - transfuse prn  5.  Change in pupil size  - STAT head CT today   6.  Dispo: palliative consulted, appreciate assistance    Subjective:    Seen in room- about to be taken to CT.  Had a long discussion with Altha Harm, [name]'s wife and POA.  Pt has had a decline in health over the past several months.  Has been incontinent for the past 2 and Altha Harm has been helping him transfer for at least the past 2.  Discussed that pt is critically ill with MSOF.  Renal failure is the result of critical illness, not the driving factor in the process.  We discussed that dialysis supports the body but does not restore to baseline level of health.  Altha Harm expresses that [name] told her prior to this hospitalization and in the ED for this hospitalization that he would not want dialysis.     Objective:   BP (!) 153/26 (BP Location: Left Arm)   Pulse (!) 57   Temp (!) 97.4 F (36.3 C)   Resp 17   Ht 5\' 8"  (1.727 m)   Wt 69.9 kg   SpO2 96%   BMI 23.43 kg/m   Intake/Output Summary (Last 24 hours) at 07/02/2022 1246 Last data filed at 07/02/2022 1213 Gross per 24 hour   Intake 1851.93 ml  Output 94 ml  Net 1757.93 ml   Weight change: -4.3 kg  Physical Exam: GEN ill-appearing, in bed HEENT ETT in place, RT suctioning NECK + JVD PULM coarse mechanical bilaterally CV RRR ABD soft EXT 2+ anasarca NEURO intubated and sedated SKIN multiple weeping areas  Imaging: DG CHEST PORT 1 VIEW  Result Date: 07/01/2022 CLINICAL DATA:  Respiratory failure EXAM: PORTABLE CHEST 1 VIEW COMPARISON:  Prior chest x-ray yesterday FINDINGS: The patient remains intubated. The tip of the endotracheal tube is 4 cm above the carina. A right upper extremity PICC is present. The catheter tip overlies the mid SVC. A left upper extremity PICC is also present. The catheter tip overlies the mid SVC. Weighted tip feeding tube present. The tip of the tube overlies the upper stomach. Persistent diffuse bilateral interstitial and airspace opacities without interval change. IMPRESSION: 1. The enteric feeding  tube remains positioned in the stomach. 2. Slight interval change in the positioning of the tip of the left upper extremity PICC. The tip now overlies the mid SVC. 3. Other support apparatus in stable and satisfactory position. 4. Similar appearance of multifocal interstitial and airspace opacities throughout the lungs consistent with multifocal pneumonia. Electronically Signed   By: Malachy Moan M.D.   On: 07/01/2022 08:48   US Abdomen Complete  Result Date: 06/30/2022 CLINICAL DATA:  [id number] Abnormal liver enzymes [id number] EXAM: ABDOMEN ULTRASOUND COMPLETE COMPARISON:  August 16, 2021, June 16, 2013 FINDINGS: Evaluation is limited by patient inability to fully cooperate with exam. Gallbladder: Cholelithiasis and biliary sludge is noted. No wall thickening visualized. No sonographic Murphy sign noted by sonographer. Common bile duct: Diameter: Visualized portion measures 5 mm, within normal limits Liver: No focal lesion identified. Mildly heterogeneous parenchymal echogenicity. Portal  vein is patent on color Doppler imaging with normal direction of blood flow towards the liver. IVC: No abnormality visualized. Pancreas: Visualized portion unremarkable. Majority is not visualized secondary to shadowing bowel gas. Spleen: Poorly visualized secondary to shadowing bowel gas. Right Kidney: Length: 10.9 cm. Echogenicity is the upper limits of normal. No hydronephrosis visualized. Cortical thinning. Cyst of the inferior pole with mildly prominent internal septation measures 5.9 x 4.5 by 5.4 cm, similar in comparison to prior CT. This at been present since at least 2014 and is most consistent with a mildly complicated cyst (for which no dedicated imaging follow-up is recommended). Left Kidney: Length: 9.3 cm. Limited assessment secondary to shadowing bowel gas. Echogenicity is the upper limits of normal. No mass or hydronephrosis visualized. Abdominal aorta: No aneurysm visualized. Other findings: Small volume free fluid. IMPRESSION: 1. Cholelithiasis and biliary sludge without definitive sonographic evidence of acute cholecystitis. If persistent concern for acute cholecystitis, consider dedicated HIDA scan. 2. Small volume free fluid. Electronically Signed   By: Meda Klinefelter M.D.   On: 06/30/2022 19:41    Labs: BMET Recent Labs  Lab 06/28/22 1430 06/29/22 0418 06/29/22 1342 06/30/22 0515 06/30/22 1455 07/01/22 0550 07/02/22 0407  NA 137 140 140 138 138 137 135  K 4.4 3.9 4.6 5.4* 4.6 4.3 4.8  CL 104 108 107 101 102 101 103  CO2 24 23 21* 17* 19* 24 19*  GLUCOSE 136* 167* 198* 196* 232* 142* 158*  BUN 36* 41* 46* 59* 67* 84* 96*  CREATININE 0.88 1.26* 1.48* 2.29* 2.49* 2.92* 3.55*  CALCIUM 8.1* 8.2* 8.7* 8.9 8.3* 8.7* 8.4*  PHOS 3.8  --   --  8.1*  --   --  4.3   CBC Recent Labs  Lab 06/28/22 1200 06/29/22 0418 06/30/22 0515 06/30/22 1455 06/30/22 2100 07/01/22 0550 07/01/22 0800 07/02/22 0407  WBC 6.1 7.0   < > 8.4 9.6 11.6* 11.9* 14.1*  NEUTROABS 5.5 6.2  --  7.0   --   --   --  12.6*  HGB 11.3* 9.6*   < > 9.0* 8.7* 8.5* 8.5* 9.2*  HCT 35.6* 31.2*   < > 28.9* 27.4* 25.9* 26.2* 28.4*  MCV 94.4 96.3   < > 95.4 92.9 89.9 91.9 90.2  PLT 260 256   < > 153 128* 135* 137* 148*   < > = values in this interval not displayed.    Medications:     Chlorhexidine Gluconate Cloth  6 each Topical Daily   cholecalciferol  2,000 Units Oral Daily   docusate  100 mg Per Tube BID   dorzolamide-timolol  1 drop Both Eyes BID   heparin  5,000 Units Subcutaneous Q8H   insulin aspart  0-9 Units Subcutaneous Q4H   leptospermum manuka honey  1 Application Topical Daily   methylPREDNISolone (SOLU-MEDROL) injection  40 mg Intravenous Q24H   midodrine  10 mg Per Tube TID WC   multivitamin with minerals  1 tablet Oral Daily   mouth rinse  15 mL Mouth Rinse Q2H   pantoprazole (PROTONIX) IV  40 mg Intravenous Q24H   polyethylene glycol  17 g Per Tube Daily   sodium bicarbonate  650 mg Per Tube TID   sodium chloride flush  10-40 mL Intracatheter Q12H   sodium chloride flush  10-40 mL Intracatheter Q12H   vancomycin variable dose per unstable renal function (pharmacist dosing)   Does not apply See admin instructions   zinc sulfate  220 mg Oral Daily    Madelon Lips MD 07/02/2022, 12:46 PM

## 2022-07-03 ENCOUNTER — Inpatient Hospital Stay (HOSPITAL_COMMUNITY): Payer: Medicare PPO

## 2022-07-03 DIAGNOSIS — G9340 Encephalopathy, unspecified: Secondary | ICD-10-CM | POA: Diagnosis not present

## 2022-07-03 DIAGNOSIS — J9601 Acute respiratory failure with hypoxia: Secondary | ICD-10-CM

## 2022-07-03 DIAGNOSIS — Z7189 Other specified counseling: Secondary | ICD-10-CM

## 2022-07-03 DIAGNOSIS — Z66 Do not resuscitate: Secondary | ICD-10-CM

## 2022-07-03 DIAGNOSIS — L8990 Pressure ulcer of unspecified site, unspecified stage: Secondary | ICD-10-CM | POA: Insufficient documentation

## 2022-07-03 DIAGNOSIS — Z515 Encounter for palliative care: Secondary | ICD-10-CM

## 2022-07-03 DIAGNOSIS — J189 Pneumonia, unspecified organism: Secondary | ICD-10-CM | POA: Diagnosis not present

## 2022-07-03 DIAGNOSIS — N179 Acute kidney failure, unspecified: Secondary | ICD-10-CM | POA: Diagnosis not present

## 2022-07-03 DIAGNOSIS — S7001XS Contusion of right hip, sequela: Secondary | ICD-10-CM | POA: Diagnosis not present

## 2022-07-03 DIAGNOSIS — D649 Anemia, unspecified: Secondary | ICD-10-CM | POA: Diagnosis not present

## 2022-07-03 LAB — GLUCOSE, CAPILLARY
Glucose-Capillary: 156 mg/dL — ABNORMAL HIGH (ref 70–99)
Glucose-Capillary: 172 mg/dL — ABNORMAL HIGH (ref 70–99)
Glucose-Capillary: 131 mg/dL — ABNORMAL HIGH (ref 70–99)
Glucose-Capillary: 142 mg/dL — ABNORMAL HIGH (ref 70–99)
Glucose-Capillary: 128 mg/dL — ABNORMAL HIGH (ref 70–99)
Glucose-Capillary: 129 mg/dL — ABNORMAL HIGH (ref 70–99)

## 2022-07-03 LAB — CULTURE, BLOOD (ROUTINE X 2)
Culture: NO GROWTH
Culture: NO GROWTH
Special Requests: ADEQUATE
Special Requests: ADEQUATE

## 2022-07-03 LAB — CBC WITH DIFFERENTIAL/PLATELET
Abs Immature Granulocytes: 0.57 10*3/uL — ABNORMAL HIGH (ref 0.00–0.07)
Basophils Absolute: 0 10*3/uL (ref 0.0–0.1)
Basophils Relative: 0 %
Eosinophils Absolute: 0 10*3/uL (ref 0.0–0.5)
Eosinophils Relative: 0 %
HCT: 31.4 % — ABNORMAL LOW (ref 39.0–52.0)
Hemoglobin: 10 g/dL — ABNORMAL LOW (ref 13.0–17.0)
Immature Granulocytes: 3 %
Lymphocytes Relative: 3 %
Lymphs Abs: 0.5 10*3/uL — ABNORMAL LOW (ref 0.7–4.0)
MCH: 29.4 pg (ref 26.0–34.0)
MCHC: 31.8 g/dL (ref 30.0–36.0)
MCV: 92.4 fL (ref 80.0–100.0)
Monocytes Absolute: 0.8 10*3/uL (ref 0.1–1.0)
Monocytes Relative: 4 %
Neutro Abs: 15.1 10*3/uL — ABNORMAL HIGH (ref 1.7–7.7)
Neutrophils Relative %: 90 %
Platelets: 189 10*3/uL (ref 150–400)
RBC: 3.4 MIL/uL — ABNORMAL LOW (ref 4.22–5.81)
RDW: 19.2 % — ABNORMAL HIGH (ref 11.5–15.5)
WBC: 17 10*3/uL — ABNORMAL HIGH (ref 4.0–10.5)
nRBC: 0.3 % — ABNORMAL HIGH (ref 0.0–0.2)

## 2022-07-03 LAB — POTASSIUM: Potassium: 5.6 mmol/L — ABNORMAL HIGH (ref 3.5–5.1)

## 2022-07-03 LAB — COMPREHENSIVE METABOLIC PANEL
ALT: 115 U/L — ABNORMAL HIGH (ref 0–44)
AST: 68 U/L — ABNORMAL HIGH (ref 15–41)
Albumin: 2.3 g/dL — ABNORMAL LOW (ref 3.5–5.0)
Alkaline Phosphatase: 74 U/L (ref 38–126)
Anion gap: 17 — ABNORMAL HIGH (ref 5–15)
BUN: 125 mg/dL — ABNORMAL HIGH (ref 8–23)
CO2: 21 mmol/L — ABNORMAL LOW (ref 22–32)
Calcium: 8.9 mg/dL (ref 8.9–10.3)
Chloride: 100 mmol/L (ref 98–111)
Creatinine, Ser: 4.59 mg/dL — ABNORMAL HIGH (ref 0.61–1.24)
GFR, Estimated: 12 mL/min — ABNORMAL LOW (ref >60)
Glucose, Bld: 147 mg/dL — ABNORMAL HIGH (ref 70–99)
Potassium: 6 mmol/L — ABNORMAL HIGH (ref 3.5–5.1)
Sodium: 138 mmol/L (ref 135–145)
Total Bilirubin: 1.4 mg/dL — ABNORMAL HIGH (ref 0.3–1.2)
Total Protein: 4.8 g/dL — ABNORMAL LOW (ref 6.5–8.1)

## 2022-07-03 LAB — VANCOMYCIN, RANDOM: Vancomycin Rm: 27 ug/mL

## 2022-07-03 LAB — BASIC METABOLIC PANEL
Anion gap: 14 (ref 5–15)
BUN: 124 mg/dL — ABNORMAL HIGH (ref 8–23)
CO2: 19 mmol/L — ABNORMAL LOW (ref 22–32)
Calcium: 8.2 mg/dL — ABNORMAL LOW (ref 8.9–10.3)
Chloride: 102 mmol/L (ref 98–111)
Creatinine, Ser: 4.38 mg/dL — ABNORMAL HIGH (ref 0.61–1.24)
GFR, Estimated: 13 mL/min — ABNORMAL LOW (ref >60)
Glucose, Bld: 319 mg/dL — ABNORMAL HIGH (ref 70–99)
Potassium: 5.6 mmol/L — ABNORMAL HIGH (ref 3.5–5.1)
Sodium: 135 mmol/L (ref 135–145)

## 2022-07-03 LAB — TRIGLYCERIDES: Triglycerides: 92 mg/dL (ref <150)

## 2022-07-03 MED ORDER — PRISMASOL BGK 0/2.5 32-2.5 MEQ/L EC SOLN
Status: DC
  Filled 2022-07-03 (×3): qty 5000

## 2022-07-03 MED ORDER — PRISMASOL BGK 0/2.5 32-2.5 MEQ/L EC SOLN
Status: DC
  Filled 2022-07-03 (×13): qty 5000

## 2022-07-03 MED ORDER — HYDRALAZINE HCL 20 MG/ML IJ SOLN
10.0000 mg | INTRAMUSCULAR | Status: DC | PRN
  Administered 2022-07-03: 20 mg via INTRAVENOUS
  Administered 2022-07-07: 10 mg via INTRAVENOUS
  Filled 2022-07-03 (×2): qty 1

## 2022-07-03 MED ORDER — SODIUM ZIRCONIUM CYCLOSILICATE 10 G PO PACK
10.0000 g | PACK | Freq: Every day | ORAL | Status: DC
  Administered 2022-07-03: 10 g
  Filled 2022-07-03: qty 1

## 2022-07-03 MED ORDER — INSULIN ASPART 100 UNIT/ML IV SOLN
5.0000 [IU] | Freq: Once | INTRAVENOUS | Status: AC
  Administered 2022-07-03: 5 [IU] via INTRAVENOUS

## 2022-07-03 MED ORDER — HEPARIN SODIUM (PORCINE) 1000 UNIT/ML DIALYSIS
1000.0000 [IU] | INTRAMUSCULAR | Status: DC | PRN
  Administered 2022-07-03: 5000 [IU] via INTRAVENOUS_CENTRAL
  Filled 2022-07-03: qty 6
  Filled 2022-07-03: qty 5

## 2022-07-03 MED ORDER — SODIUM CHLORIDE 0.9 % IV SOLN
1.0000 g | Freq: Three times a day (TID) | INTRAVENOUS | Status: DC
  Administered 2022-07-04 – 2022-07-07 (×10): 1 g via INTRAVENOUS
  Filled 2022-07-03 (×11): qty 20

## 2022-07-03 MED ORDER — MIDODRINE HCL 5 MG PO TABS: 5.0000 mg | ORAL_TABLET | Freq: Three times a day (TID) | ORAL | Status: DC

## 2022-07-03 MED ORDER — DEXTROSE 50 % IV SOLN
1.0000 | Freq: Once | INTRAVENOUS | Status: AC
  Administered 2022-07-03: 50 mL via INTRAVENOUS
  Filled 2022-07-03: qty 50

## 2022-07-03 MED ORDER — SODIUM ZIRCONIUM CYCLOSILICATE 10 G PO PACK
10.0000 g | PACK | Freq: Three times a day (TID) | ORAL | Status: DC
  Administered 2022-07-03 (×2): 10 g via ORAL
  Filled 2022-07-03 (×2): qty 1

## 2022-07-03 MED ORDER — FUROSEMIDE 10 MG/ML IJ SOLN
100.0000 mg | Freq: Once | INTRAVENOUS | Status: AC
  Administered 2022-07-03: 100 mg via INTRAVENOUS
  Filled 2022-07-03: qty 10

## 2022-07-03 NOTE — Consult Note (Addendum)
Consultation Note Date: 07/03/2022   Patient Name: Travis Wolfe  DOB: June 08, 1935  MRN: [id number]  Age / Sex: 88 y.o., male  PCP: Derrill Center., MD Referring Physician: Rigoberto Noel, MD  Reason for Consultation: Establishing goals of care  HPI/Patient Profile: 88 y.o. male  with past medical history of atrial fibrillation on eliquis, hypertension, mitral regurgitation, and complex surgical history of the right hip with recent arthroplasty resection with placement of antibiotic spacer and started on daptomycin and ceftriaxone 06/07/22 admitted on 06/25/2022 with weakness and anemia. Treated for pneumonia. 1/12 required transfer to ICU for code stroke - imaging negative.  1/13 had hip procedure and remained intubated afterward. Patient with AKI and meets criteria for CRRT. PMT consulted to discuss [place].   Clinical Assessment and Goals of Care: I have reviewed medical records including EPIC notes, labs and imaging, received report from CCM and RN, assessed the patient and then met with patient's wife Altha Harm  to discuss diagnosis prognosis, [place], EOL wishes, disposition and options.  I introduced Palliative Medicine as specialized medical care for people living with serious illness. It focuses on providing relief from the symptoms and stress of a serious illness. The goal is to improve quality of life for both the patient and the family.  We discussed a brief life review of the patient. She tells me they have been married for 35 years. She tells me he has 2 sons. 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 He was an avid runner.   As far as functional and nutritional status, Altha Harm tells me of a decline in recent months. She tells me of a loss of function and ambulatory ability. She tells me of worsened appetite. Frequent falls. Some confusion.     We discussed patient's current illness and what it means in the  larger context of patient's on-going co-morbidities.  Natural disease trajectory and expectations at EOL were discussed. We discuss his critical illness and organ failure.   I attempted to elicit values and goals of care important to the patient.  Altha Harm tells me patient has seen people go through dialysis and has always said he wouldn't want that. She tells me they even spoke about this while he was in ED for this encounter and he again confirmed he would not want HD.   The difference between aggressive medical intervention and comfort care was considered in light of the patient's goals of care. Altha Harm feels as though she needs to "try" CRRT for a time limited trial to see if it helps. If he does not improve then more discussions will be had about how to approach [name]'s care. If he does improve, she is hopeful he returns to a state where he can make  his own medical wishes known to his other family members and medical team.  Discussed with Altha Harm the importance of continued conversation with family and the medical providers regarding overall plan of care and treatment options, ensuring decisions are within the context of the patient's values and GOCs.    Questions and concerns were addressed. The family was encouraged to call with questions or concerns.   Primary Decision Maker NEXT OF KIN - wife Altha Harm    SUMMARY OF RECOMMENDATIONS   To proceed with time limited trial (72 hrs) of CRRT - if no improvement, ongoing discussions about how to approach care Wife knows patient would not want long term HD  Code Status/Advance Care Planning:  DNR     Primary Diagnoses: Present on Admission:  Symptomatic anemia  PAF (paroxysmal atrial fibrillation) (HCC)   I have reviewed the medical record, interviewed the patient and family, and examined the patient. The following aspects are pertinent.  Past Medical History:  Diagnosis Date   A-fib (HCC)    REPORTS WAS DUE TO DEHYDRATION 3  YEARS AGO BUT WAS GIVEN FLUIDS IN ED AND MEDS TO SLOW HR AND DROVE HIMSELF HOME  ; HAD ABLATION X2 WITH CARDIO DR. AKBARY AS PRECAUTION  ; has been on eliquis since but reports he has not taken it for 5 weeks b/c he was told to hold it for the multiple irrigations and debridements for the hip infection. reports his cardiologist is aware he has been holding it;    Arthritis    Atherosclerosis of both carotid arteries    BPH (benign prostatic hyperplasia)    Dysrhythmia    a-fib   Fall    fell on friday 01-16-18;   lost balAnce whIile tryng to get his sock on ;sustained skin tear to left wrist ; no drainage  , scabbed over , and has been covering with bandage    Heart murmur    SINCE HIS 20s   HLD (hyperlipidemia)    Hypertension    Non-rheumatic mitral regurgitation    Social History   Socioeconomic History   Marital status: Married    Spouse name: Not on file   Number of children: Not on file   Years of education: Not on file   Highest education level: Not on file  Occupational History   Not on file  Tobacco Use   Smoking status: Never   Smokeless tobacco: Never  Vaping Use   Vaping Use: Never used  Substance and Sexual Activity   Alcohol use: Yes    Alcohol/week: 3.0 standard drinks of alcohol    Types: 3 Standard drinks or equivalent per week    Comment: socially    Drug use: No   Sexual activity: Not Currently  Other Topics Concern   Not on file  Social History Narrative   Not on file   Social Determinants of Health   Financial Resource Strain: Not on file  Food Insecurity: No Food Insecurity (06/26/2022)   Hunger Vital Sign    Worried About Running Out of Food in the Last Year: Never true    Ran Out of Food in the Last Year: Never true  Transportation Needs: No Transportation Needs (06/26/2022)   PRAPARE - Administrator, Civil Service (Medical): No    Lack of Transportation (Non-Medical): No  Physical Activity: Not on file  Stress: Not on file  Social  Connections: Not on file   Family History  Problem Relation Age of Onset   Hypertension Mother    Cancer Mother    Scheduled Meds:  Chlorhexidine Gluconate Cloth  6 each Topical Daily   cholecalciferol  2,000 Units Oral Daily   docusate  100 mg Per Tube BID   dorzolamide-timolol  1 drop Both Eyes BID   heparin  5,000 Units Subcutaneous Q8H   insulin aspart  0-9 Units Subcutaneous Q4H   leptospermum manuka honey  1 Application Topical Daily   methylPREDNISolone (SOLU-MEDROL) injection  40 mg Intravenous Q24H   multivitamin with minerals  1 tablet Oral Daily   mouth rinse  15 mL Mouth Rinse Q2H   pantoprazole (PROTONIX) IV  40 mg Intravenous Q24H   polyethylene glycol  17  g Per Tube Daily   sodium bicarbonate  650 mg Per Tube TID   sodium chloride flush  10-40 mL Intracatheter Q12H   sodium chloride flush  10-40 mL Intracatheter Q12H   sodium zirconium cyclosilicate  10 g Oral TID   vancomycin variable dose per unstable renal function (pharmacist dosing)   Does not apply See admin instructions   zinc sulfate  220 mg Oral Daily   Continuous Infusions:  sodium chloride     sodium chloride Stopped (07/02/22 1245)   sodium chloride Stopped (07/02/22 0120)   feeding supplement (VITAL 1.5 CAL) 55 mL/hr at 07/03/22 1200   meropenem (MERREM) IV Stopped (07/03/22 0946)   propofol (DIPRIVAN) infusion 20 mcg/kg/min (07/03/22 1200)   PRN Meds:.Place/Maintain arterial line **AND** sodium chloride, sodium chloride, sodium chloride, acetaminophen **OR** acetaminophen, albuterol, fentaNYL (SUBLIMAZE) injection, fentaNYL (SUBLIMAZE) injection, hydrALAZINE, ipratropium-albuterol, mouth rinse, sodium chloride flush, sodium chloride flush Allergies  Allergen Reactions   Tape Dermatitis and Rash    ONLY use Paper tape   Other     Cat dander   Doxycycline Rash   Horse Epithelium Rash   Review of Systems  Unable to perform ROS: Intubated    Physical Exam Constitutional:      General: He is  not in acute distress.    Appearance: He is ill-appearing.  Pulmonary:     Comments: Intubated, coughing Skin:    General: Skin is warm and dry.     Vital Signs: BP (!) 210/75   Pulse 84   Temp 98.3 F (36.8 C) (Oral)   Resp 20   Ht 5\' 8"  (1.727 m)   Wt 77.7 kg   SpO2 94%   BMI 26.05 kg/m  Pain Scale: CPOT   Pain Score: 8    SpO2: SpO2: 94 % O2 Device:SpO2: 94 % O2 Flow Rate: .O2 Flow Rate (L/min): 15 L/min  IO: Intake/output summary:  Intake/Output Summary (Last 24 hours) at 07/03/2022 1511 Last data filed at 07/03/2022 1200 Gross per 24 hour  Intake 1764.17 ml  Output 161 ml  Net 1603.17 ml    LBM: Last BM Date : 07/02/22 Baseline Weight: Weight: 67.1 kg Most recent weight: Weight: 77.7 kg      *Please note that this is a verbal dictation therefore any spelling or grammatical errors are due to the "[place] [employer]" system interpretation.   Juel Burrow, DNP, AGNP-C Palliative Medicine Team [phone] Pager: [phone]

## 2022-07-03 NOTE — Progress Notes (Signed)
XRAY reviewed post HD cath placement, line in good position for use. No complications noted.     Noe Gens, MSN, APRN, NP-C, AGACNP-BC [facility] 07/03/2022, 5:35 PM   Please see [email or web address] for pager details.   From 7A-7P if no response, please call [phone] After hours, please call ELink [phone]

## 2022-07-03 NOTE — Progress Notes (Signed)
NAME:  Travis Wolfe, MRN:  [id number], DOB:  22-Oct-1934, LOS: 8 ADMISSION DATE:  06/25/2022, CONSULTATION DATE:  06/28/22 REFERRING MD:  Flora Lipps, MD CHIEF COMPLAINT:  respiratory failure   History of Present Illness:  Travis Wolfe is an 88 year old male with history of atrial fibrillation on eliquis, hypertension and complex surgical history of the right hip with recent arthroplasty resection with placement of antibiotic spacer and started on daptomycin and ceftriaxone 06/07/22, who was admitted 06/25/22 from SNF with weakness and drop in hemoglobin. He was started on antibiotics for multifocal pneumonia based on chest radiograph on admission.  06/28/21 code stroke was called due to altered mentation and inability to move his upper extremities along with facial asymmetry. CTA head was negative for stroke.   He was Wolfe alert and responsive at time of evaluation in the ICU. His wife was at the bedside.   Orthopedics was planning return to OR on 1/13 but has post-poned that procedure at this time.   Pertinent  Medical History   Atrial Fibrillation Hypertension Mitral regurgitation  Significant Hospital Events: Including procedures, antibiotic start and stop dates in addition to other pertinent events   1/9 admitted to [facility] for weakness and anemia. Concern for pneumonia.  1/12 transferred to ICU for code stroke and increasing O2 needs 1/13 remained intubated after ortho hip proceudre, bronchoscopy and PICC line place. 1/14 a-line place, weaned off vasopressors  1/15 remains intubated, poor mental status, family meeting held  Interim History / Subjective:   Critically ill, off pressors. Hypotensive this a.m. with intermittent bouts of coughing per RN Remains intubated. Hyperkalemic and received Lokelma, has loose stool. Afebrile Wife at bedside  Objective   Blood pressure (!) 210/75, pulse 78, temperature (!) 97.5 F (36.4 C), temperature source Oral, resp. rate (!) 41,  height 5' 8"  (1.727 m), weight 77.7 kg, SpO2 97 %. CVP:  [8 mmHg-22 mmHg] 22 mmHg  Vent Mode: PRVC FiO2 (%):  [30 %-40 %] 40 % Set Rate:  [18 bmp] 18 bmp Vt Set:  [480 mL] 480 mL PEEP:  [5 cmH20] 5 cmH20 Plateau Pressure:  [20 cmH20-24 cmH20] 20 cmH20   Intake/Output Summary (Last 24 hours) at 07/03/2022 1134 Last data filed at 07/03/2022 1048 Gross per 24 hour  Intake 2436.99 ml  Output 176 ml  Net 2260.99 ml    Filed Weights   07/01/22 0500 07/02/22 0215 07/03/22 0448  Weight: 74.2 kg 69.9 kg 77.7 kg    Examination: General: elderly male, ill appearing, intubated HENT: AT/[place], moist mucous membranes, sclera anicteric Lungs: course breath sounds bilateral, no accessory muscle use, faint rhonchi Cardiovascular: rrr, no murmurs Abdomen: soft, non-tender, non-distended, BS+ Extremities: warm, 1+ edema Neuro: Sedate, RASS 0 on propofol, does not follow commands responds to painful stimuli, Left pupil 24m reactive to light, right pupil 368mreactive to light GU: foley with minimal urine   Travis 06/26/22 LV EF 5500%V systolic function and size are normal, but noted to have elevated pulmonary pressures of 7071m.   Abdominal US [place]14/24 Cholelithiasis and biliary sludge without definitive sonographic evidence of acute cholecystitis. Small volume free fluid.  Labs BUN/creatinine rising to 120/4.4, potassium from 6 decreased to 5.6 on repeat, increase leukocytosis, stable anemia  Resolved Hospital Problem list   Anion Gap Metabolic Acidosis Lactic Acidosis Hyperkalemia Shock  Assessment & Plan:  Acute Hypoxemic Respiratory Failure Multifocal Pneumonia -RVP  and urine legionella neg ?Daptomycin induced eosinophilic pneumonia - stopped daptomycin 1/12 - continue solumedrol  to 37m daily, steroids started 1/12 - HCAP coverage with cefepime then zosyn, changed to meropenem 1/14 - Continue vanc and azithromycin - F/u BAL cultures, neg so far - f/u fungitell - viral testing is  negative   Septic due to Pneumonia vs other source - blood cultures no growth to date - meropenem, vanc and azithro for antibiotic coverage - MAP goal 65 or higher. Currently off vasopressors, hold midodrine due to hypertension today  Acute Kidney Injury, Anuric - avoid nephrotoxic agents - Nephrology following -Meets criteria for CRRT-note ongoing conversation below -Trying Lasix 100  Acute Encephalopathy Change in Pupil sizes 1/16 - 1/16CT head neg - MRI brain 1/13 negative for stroke or hemorrhage - cefepime stopped 1/12 - stopped baclofen 1/13 - reduced steroid dose 1/15  -Using propofol with intermittent fentanyl, goal RASS 0 to -1 can switch to Precedex  NSTEMI Pulmonary Hypertension Paroxysmal Atrial Fibrillation Hx of hypertension - elevated troponin and inverted T waves in lateral leads - started heparin/asa 1/13, stoppded 1/14 due to drop in hemoglobin and increase in LA - unable to diurese due to renal failure  Anemia ?GI bleeding - continue PPI daily - no further reports of bloody BMs - trending H/H  S/p Right hip arthroplasty resection with antibiotic spacer Right Hip Seroma - OR 1/13 for removal of antibiotic spacer for comfort  Anasarca Moderate Protein Calorie Malnutrition - doboff placed, continue trickle tube feeds   Best Practice (right click and "Reselect all SmartList Selections" daily)   Diet/type: tubefeeds DVT prophylaxis: prophylactic heparin  GI prophylaxis: PPI Lines: Central line and Arterial Line Foley:  Yes, and it is still needed Code Status:  DNR Last date of multidisciplinary goals of care discussion [Discussed with patient's wife at bedside 1/14] 1/17 ongoing discussion with wife about dialysis, he had indicated that he did not want dialysis in the past, she is comforted about temporary HD especially if this would improve outcome, wishes to discuss Wolfe with renal  Labs   CBC: Recent Labs  Lab 06/28/22 1200 06/29/22 0418  06/30/22 0515 06/30/22 1455 06/30/22 2100 07/01/22 0550 07/01/22 0800 07/02/22 0407 07/03/22 0436  WBC 6.1 7.0   < > 8.4 9.6 11.6* 11.9* 14.1* 17.0*  NEUTROABS 5.5 6.2  --  7.0  --   --   --  12.6* 15.1*  HGB 11.3* 9.6*   < > 9.0* 8.7* 8.5* 8.5* 9.2* 10.0*  HCT 35.6* 31.2*   < > 28.9* 27.4* 25.9* 26.2* 28.4* 31.4*  MCV 94.4 96.3   < > 95.4 92.9 89.9 91.9 90.2 92.4  PLT 260 256   < > 153 128* 135* 137* 148* 189   < > = values in this interval not displayed.     Basic Metabolic Panel: Recent Labs  Lab 06/27/22 0552 06/28/22 0155 06/28/22 1430 06/29/22 0418 06/30/22 0515 06/30/22 1455 07/01/22 0550 07/02/22 0407 07/03/22 0436 07/03/22 0747 07/03/22 1018  NA 139 137 137   < > 138 138 137 135 138 135  --   K 3.3* 3.2* 4.4   < > 5.4* 4.6 4.3 4.8 6.0* 5.6* 5.6*  CL 106 106 104   < > 101 102 101 103 100 102  --   CO2 26 21* 24   < > 17* 19* 24 19* 21* 19*  --   GLUCOSE 109* 137* 136*   < > 196* 232* 142* 158* 147* 319*  --   BUN 23 27* 36*   < > 59* 67* 84* 96*  125* 124*  --   CREATININE 0.84 1.03 0.88   < > 2.29* 2.49* 2.92* 3.55* 4.59* 4.38*  --   CALCIUM 8.1* 8.1* 8.1*   < > 8.9 8.3* 8.7* 8.4* 8.9 8.2*  --   MG 2.0 1.9 3.0*  --  3.1*  --   --  3.0*  --   --   --   PHOS  --   --  3.8  --  8.1*  --   --  4.3  --   --   --    < > = values in this interval not displayed.    GFR: Estimated Creatinine Clearance: 11.9 mL/min (A) (by C-G formula based on SCr of 4.38 mg/dL (H)). Recent Labs  Lab 06/28/22 1126 06/28/22 1147 06/29/22 0439 06/29/22 1552 06/30/22 0515 06/30/22 1455 06/30/22 1735 06/30/22 2100 07/01/22 0550 07/01/22 0800 07/02/22 0407 07/03/22 0436  PROCALCITON 7.46  --  7.74  --   --   --   --   --   --   --   --   --   WBC  --    < >  --   --  12.7* 8.4  --    < > 11.6* 11.9* 14.1* 17.0*  LATICACIDVEN  --    < >  --    < > 6.6* 5.2* 3.7*  --   --  1.6  --   --    < > = values in this interval not displayed.     Liver Function Tests: Recent Labs   Lab 06/29/22 1342 06/30/22 0515 07/01/22 0550 07/02/22 0407 07/03/22 0436  AST 49* 213* 310* 125* 68*  ALT 28 87* 211* 150* 115*  ALKPHOS 80 62 65 74 74  BILITOT 1.1 1.6* 2.1* 1.5* 1.4*  PROT 6.1* 6.7 5.1* 5.1* 4.8*  ALBUMIN 2.4* 3.4* 2.9* 2.5* 2.3*    No results for input(s): "LIPASE", "AMYLASE" in the last 168 hours. No results for input(s): "AMMONIA" in the last 168 hours.  ABG    Component Value Date/Time   PHART 7.45 07/02/2022 0833   PCO2ART 29 (L) 07/02/2022 0833   PO2ART 85 07/02/2022 0833   HCO3 20.2 07/02/2022 0833   TCO2 24 06/06/2022 1213   ACIDBASEDEF 2.6 (H) 07/02/2022 0833   O2SAT 98.1 07/02/2022 0833     Coagulation Profile: Recent Labs  Lab 07/01/22 0816  INR 1.7*     Cardiac Enzymes: No results for input(s): "CKTOTAL", "CKMB", "CKMBINDEX", "TROPONINI" in the last 168 hours.  HbA1C: Hgb A1c MFr Bld  Date/Time Value Ref Range Status  06/30/2022 05:15 AM 4.8 4.8 - 5.6 % Final    Comment:    (NOTE) Pre diabetes:          5.7%-6.4%  Diabetes:              >6.4%  Glycemic control for   <7.0% adults with diabetes     CBG: Recent Labs  Lab 07/02/22 1416 07/02/22 1956 07/02/22 2335 07/03/22 0417 07/03/22 0826  GLUCAP 139* 148* 132* 156* 172*      Critical care time:  40 minutes   Donita Newland V. Elsworth Soho MD [facility] Office: [phone]   See Amion for personal pager PCCM on call pager [phone] until 7pm. Please call Elink 7p-7a. [phone]

## 2022-07-03 NOTE — Progress Notes (Signed)
[facility] Progress Note   Assessment/ Plan:    AKI: likely multifactorial in the setting of sepsis/ CTA/ low Bps/ pressor requirements.  Vanc level supratherapuetic today at 28  Would be unusual for antibiotic spacer to provide enough systemic absorption to cause AKI             - off midodrine             - long discussion with Christine (POA) and mulitple family members today   - CRRT unlikely to restore pt to baseline level of health in setting of functional decline and now critical illness with MSOF- have decided to pursue 48-72 hr trial.    - palliative consulted- appreciate assistance   2.  Acute hypoxic RF             - intubated             - on meropenem/ vanc/ azithro   3.  R hip infection             - s/p spacer with ortho 06/29/22   4.  Anemia:             - transfuse prn  5.  Change in pupil size  - STAT head CT today   6.  Dispo: palliative consulted, appreciate assistance    Subjective:    Multiple long discussions today with wife and family members, together and separately.  Ultimately have decided to proceed with trial of CRRT   Objective:   BP (!) 210/75   Pulse 84   Temp 98.3 F (36.8 C) (Oral)   Resp 20   Ht 5\' 8"  (1.727 m)   Wt 77.7 kg   SpO2 94%   BMI 26.05 kg/m   Intake/Output Summary (Last 24 hours) at 07/03/2022 1600 Last data filed at 07/03/2022 1200 Gross per 24 hour  Intake 1764.17 ml  Output 160 ml  Net 1604.17 ml   Weight change: 7.8 kg  Physical Exam: GEN ill-appearing, in bed HEENT ETT in place, RT suctioning NECK + JVD PULM coarse mechanical bilaterally CV RRR ABD soft EXT 2+ anasarca NEURO intubated and sedated SKIN multiple weeping areas  Imaging: CT HEAD WO CONTRAST (5MM)  Result Date: 07/02/2022 CLINICAL DATA:  Change in pupil size. EXAM: CT HEAD WITHOUT CONTRAST TECHNIQUE: Contiguous axial images were obtained from the base of the skull through the vertex without intravenous contrast. RADIATION DOSE  REDUCTION: This exam was performed according to the departmental dose-optimization program which includes automated exposure control, adjustment of the mA and/or kV according to patient size and/or use of iterative reconstruction technique. COMPARISON:  MRI brain dated June 29, 2022. CT head dated June 28, 2022. FINDINGS: Brain: No evidence of acute infarction, hemorrhage, hydrocephalus, extra-axial collection or mass lesion/mass effect. 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 Vascular: Atherosclerotic vascular calcification of the carotid siphons. No hyperdense vessel. Skull: Normal. Negative for fracture or focal lesion. Sinuses/Orbits: No acute finding. Other: None. IMPRESSION: 1. No acute intracranial abnormality. 2. 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 Electronically Signed   By: Titus Dubin M.D.   On: 07/02/2022 13:07    Labs: BMET Recent Labs  Lab 06/28/22 1430 06/29/22 0418 06/29/22 1342 06/30/22 0515 06/30/22 1455 07/01/22 0550 07/02/22 0407 07/03/22 0436 07/03/22 0747 07/03/22 1018  NA 137   < > 140 138 138 137 135 138 135  --   K 4.4   < > 4.6 5.4* 4.6 4.3 4.8 6.0*  5.6* 5.6*  CL 104   < > 107 101 102 101 103 100 102  --   CO2 24   < > 21* 17* 19* 24 19* 21* 19*  --   GLUCOSE 136*   < > 198* 196* 232* 142* 158* 147* 319*  --   BUN 36*   < > 46* 59* 67* 84* 96* 125* 124*  --   CREATININE 0.88   < > 1.48* 2.29* 2.49* 2.92* 3.55* 4.59* 4.38*  --   CALCIUM 8.1*   < > 8.7* 8.9 8.3* 8.7* 8.4* 8.9 8.2*  --   PHOS 3.8  --   --  8.1*  --   --  4.3  --   --   --    < > = values in this interval not displayed.   CBC Recent Labs  Lab 06/29/22 0418 06/30/22 0515 06/30/22 1455 06/30/22 2100 07/01/22 0550 07/01/22 0800 07/02/22 0407 07/03/22 0436  WBC 7.0   < > 8.4   < > 11.6* 11.9* 14.1* 17.0*  NEUTROABS 6.2  --  7.0  --   --   --  12.6* 15.1*  HGB 9.6*   < > 9.0*   < > 8.5* 8.5* 9.2* 10.0*  HCT 31.2*   < > 28.9*   < > 25.9* 26.2* 28.4* 31.4*  MCV 96.3   <  > 95.4   < > 89.9 91.9 90.2 92.4  PLT 256   < > 153   < > 135* 137* 148* 189   < > = values in this interval not displayed.    Medications:     Chlorhexidine Gluconate Cloth  6 each Topical Daily   cholecalciferol  2,000 Units Oral Daily   docusate  100 mg Per Tube BID   dorzolamide-timolol  1 drop Both Eyes BID   heparin  5,000 Units Subcutaneous Q8H   insulin aspart  0-9 Units Subcutaneous Q4H   leptospermum manuka honey  1 Application Topical Daily   methylPREDNISolone (SOLU-MEDROL) injection  40 mg Intravenous Q24H   multivitamin with minerals  1 tablet Oral Daily   mouth rinse  15 mL Mouth Rinse Q2H   pantoprazole (PROTONIX) IV  40 mg Intravenous Q24H   polyethylene glycol  17 g Per Tube Daily   sodium bicarbonate  650 mg Per Tube TID   sodium chloride flush  10-40 mL Intracatheter Q12H   sodium chloride flush  10-40 mL Intracatheter Q12H   sodium zirconium cyclosilicate  10 g Oral TID   vancomycin variable dose per unstable renal function (pharmacist dosing)   Does not apply See admin instructions   zinc sulfate  220 mg Oral Daily    Madelon Lips MD 07/03/2022, 4:00 PM

## 2022-07-03 NOTE — Procedures (Signed)
Central Venous Catheter Insertion Procedure Note  Travis Wolfe  [id number]  1934-08-14  Date:07/03/22  Time:5:25 PM   Provider Performing:Esty Ahuja Alfredo Martinez   Procedure: Insertion of Non-tunneled Central Venous Catheter(36556)with US guidance (93235)    Indication(s) Hemodialysis  Consent Risks of the procedure as well as the alternatives and risks of each were explained to the patient and/or caregiver.  Consent for the procedure was obtained and is signed in the bedside chart.  Wife consented in detail at bedside.   Anesthesia Topical only with 1% lidocaine   Timeout Verified patient identification, verified procedure, site/side was marked, verified correct patient position, special equipment/implants available, medications/allergies/relevant history reviewed, required imaging and test results available.  Sterile Technique Maximal sterile technique including full sterile barrier drape, hand hygiene, sterile gown, sterile gloves, mask, hair covering, sterile ultrasound probe cover (if used).  Procedure Description Area of catheter insertion was cleaned with chlorhexidine and draped in sterile fashion.   With real-time ultrasound guidance a HD catheter was placed into the left internal jugular vein. Guidewire visualized in IJ prior to dilation of vessel. Nonpulsatile blood flow and easy flushing noted in all ports.  The catheter was sutured in place to 20 cm, biopatch and sterile dressing applied.    Ultrasound Assessment of L IJ prior to procedure.   Complications/Tolerance None; patient tolerated the procedure well. Chest X-ray is ordered to verify placement for internal jugular or subclavian cannulation.  Chest x-ray is not ordered for femoral cannulation.  EBL Minimal, less than 59ml  Specimen(s) None  Noe Gens, MSN, APRN, NP-C, AGACNP-BC [facility] 07/03/2022, 5:25 PM   Please see [email or web address] for pager details.   From 7A-7P if no response,  please call [phone] After hours, please call ELink [phone]

## 2022-07-03 NOTE — Progress Notes (Signed)
Pharmacy Antibiotic Note  Travis Wolfe is a 88 y.o. 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  During this admission, he underwent excisional TKA with abx spacers on 06/06/22 and discharged on daptomycin and ceftriaxone with plan to treat through 07/19/22.  He presented back to the ED on 06/25/22 from SNF after he was found to have low hgb.  CXR on 06/25/22 showed findings with concern for multifocal PNA. He was started on vancomycin/cefepime on admission, changed from vancomycin to daptomycin on 06/27/22 and on 06/28/22  abx changed to vancomycin and zosyn d/t concern for eosinophilic pneumonia due to daptomycin. With worsening renal function and risk for nephrotoxicity with vanc/zosyn,zosyn changed to meropenem.   Patient remains on vancomycin + meropenem at this time (pharmacy to dose).  Significant Events: - 1/13: Went to OR for R total hip revision and removal of abx spacer. bronchoscopy   Today, 07/03/2022: Vancomycin random level = 27 mcg/mL, remains supratherapeutic SCr 4.38 - remains elevated and increased. UOP 50 mL WBC remains elevated, increasing. However, pt is on IV steroids Remains intubated  Plan: Continue meropenem 500 mg IV q12h No vancomycin dose needed at this time. Dosing vancomycin off of random levels given renal failure. Will give another dose of vancomycin once level < 15-20 mcg/mL. Continue to monitor renal function.  Azithromycin course completes today _________________________________________ Height: 5\' 8"  (172.7 cm) Weight: 77.7 kg (171 lb 4.8 oz) IBW/kg (Calculated) : 68.4  Temp (24hrs), Avg:97.5 F (36.4 C), Min:97.5 F (36.4 C), Max:97.5 F (36.4 C)  Recent Labs  Lab 06/29/22 1844 06/30/22 0515 06/30/22 1455 06/30/22 1735 06/30/22 2100 07/01/22 0040 07/01/22 0550 07/01/22 0800 07/02/22 0407 07/03/22 0436 07/03/22 0747  WBC  --  12.7* 8.4  --  9.6  --  11.6* 11.9* 14.1* 17.0*  --   CREATININE  --   2.29* 2.49*  --   --   --  2.92*  --  3.55* 4.59* 4.38*  LATICACIDVEN 2.5* 6.6* 5.2* 3.7*  --   --   --  1.6  --   --   --   VANCORANDOM  --   --   --   --   --  40  --   --   --   --   --      Estimated Creatinine Clearance: 11.9 mL/min (A) (by C-G formula based on SCr of 4.38 mg/dL (H)).    Allergies  Allergen Reactions   Tape Dermatitis and Rash    ONLY use Paper tape   Other     Cat dander   Doxycycline Rash   Horse Epithelium Rash    Lenis Noon, PharmD, BCPS Clinical Pharmacist 07/03/2022 10:27 AM

## 2022-07-03 NOTE — TOC Progression Note (Signed)
Transition of Care [facility]) - Progression Note    Patient Details  Name: Travis Wolfe MRN: [id number] Date of Birth: [birth year]  Transition of Care [facility]) CM/SW Contact  Purcell Mouton, RN Phone Number: 07/03/2022, 8:36 AM  Clinical Narrative:     TOC continuing to follow pt.   Expected Discharge Plan: [place] Barriers to Discharge: Continued Medical Work up  Expected Discharge Plan and Services   Discharge Planning Services: CM Consult   Living arrangements for the past 2 months: [place]                                       Social Determinants of Health (SDOH) Interventions SDOH Screenings   Food Insecurity: No Food Insecurity (06/26/2022)  Housing: Low Risk  (06/26/2022)  Transportation Needs: No Transportation Needs (06/26/2022)  Utilities: Not At Risk (06/26/2022)  Depression (PHQ2-9): Low Risk  (04/11/2019)  Tobacco Use: Low Risk  (07/02/2022)    Readmission Risk Interventions     No data to display

## 2022-07-03 NOTE — Progress Notes (Addendum)
Tube holder changed and ETT resecured at 24cm lip.  Slight bruising noted at the center of upper lip.  Exuderm applied to the area.  RN notified.

## 2022-07-03 NOTE — Progress Notes (Signed)
PHARMACY NOTE:  ANTIMICROBIAL RENAL DOSAGE ADJUSTMENT  Current antimicrobial regimen includes a mismatch between antimicrobial dosage and estimated renal function.  As per policy approved by the Pharmacy & Therapeutics and Medical Executive Committees, the antimicrobial dosage will be adjusted accordingly.  Current antimicrobial dosage:  Meropenem 500mg  IV q12h Vancomycin dosing per levels (last dose given on 1/13, vancomycin level = 27 on 1/17 remained in range)  Indication: prosthetic joint infection and pneumonia  Renal Function: Estimated Creatinine Clearance: 11.9 mL/min (A) (by C-G formula based on SCr of 4.38 mg/dL (H)). [x]      CRRT - starting on 1/17 PM    Antimicrobial dosage has been changed to:   Once CRRT starts, change to:  Meropenem 1g IV q8h Continue dosing vancomycin using levels for now - recheck next level with AM labs on 1/18.  If vancomycin level is < 20 mcg/ml, then start CRRT dosed Vancomycin 10-15 mg/kg q24h  Thank you for allowing pharmacy to be a part of this patient's care.  Gretta Arab PharmD, BCPS [facility] main pharmacy [phone] 07/03/2022 4:23 PM

## 2022-07-03 NOTE — Progress Notes (Signed)
[facility] Progress Note Patient Name: Travis Wolfe DOB: [birth year] MRN: [id number]   Date of Service  07/03/2022  HPI/Events of Note  K+ 6.0, GFR 12.  eICU Interventions  Hyperkalemia treatment protocol ordered.        Frederik Pear 07/03/2022, 6:33 AM

## 2022-07-04 ENCOUNTER — Inpatient Hospital Stay (HOSPITAL_COMMUNITY): Payer: Medicare PPO

## 2022-07-04 DIAGNOSIS — J9601 Acute respiratory failure with hypoxia: Secondary | ICD-10-CM | POA: Diagnosis not present

## 2022-07-04 DIAGNOSIS — J189 Pneumonia, unspecified organism: Secondary | ICD-10-CM | POA: Diagnosis not present

## 2022-07-04 DIAGNOSIS — Z7189 Other specified counseling: Secondary | ICD-10-CM | POA: Diagnosis not present

## 2022-07-04 DIAGNOSIS — G9340 Encephalopathy, unspecified: Secondary | ICD-10-CM | POA: Diagnosis not present

## 2022-07-04 DIAGNOSIS — N179 Acute kidney failure, unspecified: Secondary | ICD-10-CM | POA: Diagnosis not present

## 2022-07-04 DIAGNOSIS — Z515 Encounter for palliative care: Secondary | ICD-10-CM | POA: Diagnosis not present

## 2022-07-04 LAB — RENAL FUNCTION PANEL
Albumin: 2.3 g/dL — ABNORMAL LOW (ref 3.5–5.0)
Albumin: 2.4 g/dL — ABNORMAL LOW (ref 3.5–5.0)
Anion gap: 12 (ref 5–15)
Anion gap: 12 (ref 5–15)
BUN: 99 mg/dL — ABNORMAL HIGH (ref 8–23)
BUN: 60 mg/dL — ABNORMAL HIGH (ref 8–23)
CO2: 23 mmol/L (ref 22–32)
CO2: 25 mmol/L (ref 22–32)
Calcium: 8.2 mg/dL — ABNORMAL LOW (ref 8.9–10.3)
Calcium: 8.1 mg/dL — ABNORMAL LOW (ref 8.9–10.3)
Chloride: 103 mmol/L (ref 98–111)
Chloride: 101 mmol/L (ref 98–111)
Creatinine, Ser: 3.15 mg/dL — ABNORMAL HIGH (ref 0.61–1.24)
Creatinine, Ser: 1.71 mg/dL — ABNORMAL HIGH (ref 0.61–1.24)
GFR, Estimated: 19 mL/min — ABNORMAL LOW (ref >60)
GFR, Estimated: 39 mL/min — ABNORMAL LOW (ref >60)
Glucose, Bld: 156 mg/dL — ABNORMAL HIGH (ref 70–99)
Glucose, Bld: 120 mg/dL — ABNORMAL HIGH (ref 70–99)
Phosphorus: 4.6 mg/dL (ref 2.5–4.6)
Phosphorus: 3.6 mg/dL (ref 2.5–4.6)
Potassium: 4.5 mmol/L (ref 3.5–5.1)
Potassium: 4.1 mmol/L (ref 3.5–5.1)
Sodium: 138 mmol/L (ref 135–145)
Sodium: 138 mmol/L (ref 135–145)

## 2022-07-04 LAB — GLUCOSE, CAPILLARY
Glucose-Capillary: 149 mg/dL — ABNORMAL HIGH (ref 70–99)
Glucose-Capillary: 167 mg/dL — ABNORMAL HIGH (ref 70–99)
Glucose-Capillary: 146 mg/dL — ABNORMAL HIGH (ref 70–99)
Glucose-Capillary: 125 mg/dL — ABNORMAL HIGH (ref 70–99)
Glucose-Capillary: 122 mg/dL — ABNORMAL HIGH (ref 70–99)
Glucose-Capillary: 104 mg/dL — ABNORMAL HIGH (ref 70–99)
Glucose-Capillary: 118 mg/dL — ABNORMAL HIGH (ref 70–99)

## 2022-07-04 LAB — CBC WITH DIFFERENTIAL/PLATELET
Abs Immature Granulocytes: 0.31 10*3/uL — ABNORMAL HIGH (ref 0.00–0.07)
Basophils Absolute: 0 10*3/uL (ref 0.0–0.1)
Basophils Relative: 0 %
Eosinophils Absolute: 0 10*3/uL (ref 0.0–0.5)
Eosinophils Relative: 0 %
HCT: 32.7 % — ABNORMAL LOW (ref 39.0–52.0)
Hemoglobin: 10.6 g/dL — ABNORMAL LOW (ref 13.0–17.0)
Immature Granulocytes: 2 %
Lymphocytes Relative: 2 %
Lymphs Abs: 0.2 10*3/uL — ABNORMAL LOW (ref 0.7–4.0)
MCH: 29.9 pg (ref 26.0–34.0)
MCHC: 32.4 g/dL (ref 30.0–36.0)
MCV: 92.4 fL (ref 80.0–100.0)
Monocytes Absolute: 0.6 10*3/uL (ref 0.1–1.0)
Monocytes Relative: 4 %
Neutro Abs: 13.8 10*3/uL — ABNORMAL HIGH (ref 1.7–7.7)
Neutrophils Relative %: 92 %
Platelets: 178 10*3/uL (ref 150–400)
RBC: 3.54 MIL/uL — ABNORMAL LOW (ref 4.22–5.81)
RDW: 19.7 % — ABNORMAL HIGH (ref 11.5–15.5)
WBC: 15 10*3/uL — ABNORMAL HIGH (ref 4.0–10.5)
nRBC: 0.3 % — ABNORMAL HIGH (ref 0.0–0.2)

## 2022-07-04 LAB — BASIC METABOLIC PANEL
Anion gap: 9 (ref 5–15)
BUN: 95 mg/dL — ABNORMAL HIGH (ref 8–23)
CO2: 23 mmol/L (ref 22–32)
Calcium: 7.8 mg/dL — ABNORMAL LOW (ref 8.9–10.3)
Chloride: 105 mmol/L (ref 98–111)
Creatinine, Ser: 2.91 mg/dL — ABNORMAL HIGH (ref 0.61–1.24)
GFR, Estimated: 20 mL/min — ABNORMAL LOW (ref >60)
Glucose, Bld: 152 mg/dL — ABNORMAL HIGH (ref 70–99)
Potassium: 4.2 mmol/L (ref 3.5–5.1)
Sodium: 137 mmol/L (ref 135–145)

## 2022-07-04 LAB — VANCOMYCIN, RANDOM: Vancomycin Rm: 18 ug/mL

## 2022-07-04 LAB — MAGNESIUM: Magnesium: 2.8 mg/dL — ABNORMAL HIGH (ref 1.7–2.4)

## 2022-07-04 MED ORDER — DEXMEDETOMIDINE HCL IN NACL 200 MCG/50ML IV SOLN: 0.0000 ug/kg/h | INTRAVENOUS | Status: DC

## 2022-07-04 MED ORDER — VANCOMYCIN HCL IN DEXTROSE 1-5 GM/200ML-% IV SOLN
1000.0000 mg | INTRAVENOUS | Status: DC
  Administered 2022-07-04 – 2022-07-07 (×4): 1000 mg via INTRAVENOUS
  Filled 2022-07-04 (×4): qty 200

## 2022-07-04 MED ORDER — LIP MEDEX EX OINT
TOPICAL_OINTMENT | CUTANEOUS | Status: DC | PRN
  Administered 2022-07-04 (×2): 1 via TOPICAL
  Filled 2022-07-04: qty 7

## 2022-07-04 MED ORDER — HEPARIN SODIUM (PORCINE) 1000 UNIT/ML IJ SOLN
1000.0000 [IU] | INTRAMUSCULAR | Status: DC | PRN
  Administered 2022-07-07: 2800 [IU]
  Filled 2022-07-04 (×2): qty 1

## 2022-07-04 MED ORDER — PRISMASOL BGK 4/2.5 32-4-2.5 MEQ/L REPLACEMENT SOLN: Status: DC

## 2022-07-04 MED ORDER — LABETALOL HCL 100 MG PO TABS
100.0000 mg | ORAL_TABLET | Freq: Two times a day (BID) | ORAL | Status: DC
  Administered 2022-07-04 – 2022-07-08 (×6): 100 mg
  Filled 2022-07-04 (×6): qty 1

## 2022-07-04 MED ORDER — PRISMASOL BGK 4/2.5 32-4-2.5 MEQ/L EC SOLN: Status: DC

## 2022-07-04 MED ORDER — PANTOPRAZOLE SODIUM 40 MG PO TBEC
40.0000 mg | DELAYED_RELEASE_TABLET | Freq: Every day | ORAL | Status: DC
  Administered 2022-07-04 – 2022-07-05 (×2): 40 mg via ORAL
  Filled 2022-07-04 (×3): qty 1

## 2022-07-04 NOTE — Progress Notes (Signed)
Pt was turned to his right side during a bath when he was noted to have tube feed colored secretions in his ETT and streaming out of his mouth. Tube feeds were held prior to turning. Pt was suctioned both oropharyngeal and through ETT. [place] RN notified and tube feeds currently off.

## 2022-07-04 NOTE — Progress Notes (Signed)
Daily Progress Note   Patient Name: Travis Wolfe       Date: 07/04/2022 DOB: 1934/09/07  Age: 88 y.o. MRN#: [id number] Attending Physician: Rigoberto Noel, MD Primary Care Physician: Derrill Center., MD Admit Date: 06/25/2022  Reason for Consultation/Follow-up: Establishing goals of care  Subjective: Remains unresponsive on ventilator   Length of Stay: 9  Current Medications: Scheduled Meds:   Chlorhexidine Gluconate Cloth  6 each Topical Daily   cholecalciferol  2,000 Units Oral Daily   docusate  100 mg Per Tube BID   dorzolamide-timolol  1 drop Both Eyes BID   heparin  5,000 Units Subcutaneous Q8H   insulin aspart  0-9 Units Subcutaneous Q4H   labetalol  100 mg Per Tube BID   leptospermum manuka honey  1 Application Topical Daily   methylPREDNISolone (SOLU-MEDROL) injection  40 mg Intravenous Q24H   multivitamin with minerals  1 tablet Oral Daily   mouth rinse  15 mL Mouth Rinse Q2H   pantoprazole  40 mg Oral Q1200   polyethylene glycol  17 g Per Tube Daily   sodium bicarbonate  650 mg Per Tube TID   sodium chloride flush  10-40 mL Intracatheter Q12H   sodium chloride flush  10-40 mL Intracatheter Q12H   zinc sulfate  220 mg Oral Daily    Continuous Infusions:  sodium chloride     sodium chloride Stopped (07/02/22 1245)   sodium chloride Stopped (07/02/22 0120)   dexmedetomidine (PRECEDEX) IV infusion Stopped (07/04/22 0926)   feeding supplement (VITAL 1.5 CAL) 20 mL/hr at 07/04/22 1200   meropenem (MERREM) IV Stopped (07/04/22 5631)   prismasol BGK 2/2.5 dialysis solution 2,000 mL/hr at 07/04/22 1201   prismasol BGK 2/2.5 replacement solution 500 mL/hr at 07/04/22 0704   prismasol BGK 2/2.5 replacement solution 400 mL/hr at 07/04/22 0928   vancomycin Stopped (07/04/22 1001)     PRN Meds: Place/Maintain arterial line **AND** sodium chloride, sodium chloride, sodium chloride, acetaminophen **OR** acetaminophen, albuterol, fentaNYL (SUBLIMAZE) injection, fentaNYL (SUBLIMAZE) injection, heparin, hydrALAZINE, ipratropium-albuterol, mouth rinse, sodium chloride flush, sodium chloride flush  Physical Exam Constitutional:      General: He is not in acute distress.    Appearance: He is ill-appearing.     Comments: Remains on ventilator - sedation off, unresponsive  Pulmonary:     Comments: Weaning on ventilator           Vital Signs: BP (!) 112/41   Pulse 65   Temp 97.8 F (36.6 C) (Oral)   Resp (!) 23   Ht 5\' 8"  (1.727 m)   Wt 75.4 kg   SpO2 97%   BMI 25.27 kg/m  SpO2: SpO2: 97 % O2 Device: O2 Device: Ventilator O2 Flow Rate: O2 Flow Rate (L/min): 15 L/min  Intake/output summary:  Intake/Output Summary (Last 24 hours) at 07/04/2022 1228 Last data filed at 07/04/2022 1200 Gross per 24 hour  Intake 1589.07 ml  Output 1769 ml  Net -179.93 ml   LBM: Last BM Date : 07/03/22 Baseline Weight: Weight: 67.1 kg Most recent weight: Weight: 75.4 kg   Patient Active Problem List   Diagnosis Date Noted   AKI (acute kidney injury) ([place]) 07/03/2022  Pressure injury of skin 07/03/2022   Malnutrition of moderate degree 06/27/2022   Symptomatic anemia 06/25/2022   Anasarca 06/25/2022   HTN (hypertension) 06/25/2022   HLD (hyperlipidemia) 06/25/2022   Multifocal pneumonia 06/25/2022   Hypocalcemia 06/25/2022   Postoperative seroma of subcutaneous tissue after non-dermatologic procedure 05/02/2022   Hematoma of hip, right, subsequent encounter 01/31/2022   Hematoma of hip, right, sequela 01/31/2022   History of revision of total replacement of right hip joint 05/17/2021   Arthrofibrosis of hip joint, right 11/26/2018   Pain    Fever 02/20/2018   Metallosis    Staphylococcus infection    Infection of right prosthetic hip joint ([place]) 01/26/2018   S/P hip  replacement 01/26/2018   Cellulitis of right leg 12/15/2017   PAF (paroxysmal atrial fibrillation) ([place]) 12/15/2017   Abdominal wall pain in right lower quadrant 12/02/2012    Palliative Care Assessment & Plan   HPI: 88 y.o. male  with past medical history of atrial fibrillation on eliquis, hypertension, mitral regurgitation, and complex surgical history of the right hip with recent arthroplasty resection with placement of antibiotic spacer and started on daptomycin and ceftriaxone 06/07/22 admitted on 06/25/2022 with weakness and anemia. Treated for pneumonia. 1/12 required transfer to ICU for code stroke - imaging negative.  1/13 had hip procedure and remained intubated afterward. Patient with AKI and meets criteria for CRRT. PMT consulted to discuss [place].   Assessment: Wife not at bedside. Received update from RN - off sedation all morning, remains unresponsive. Weaning on ventilator.  No issues with CRRT.   Call to wife, Altha Harm. Update provided as above. All questions and concerns addressed. Plan is to continue CRRT until it has been trialed for ~72 hours and reassess situation. PMT will continue to follow.  Recommendations/Plan: Time for outcomes: currently undergoing ~72 hour trial of CRRT initiated evening of 1/17 - would not want long term HD Wife updated, support offered  Code Status: DNR  Care plan was discussed with wife, RN, Dr Elsworth Soho  Thank you for allowing the Palliative Medicine Team to assist in the care of this patient.  *Please note that this is a verbal dictation therefore any spelling or grammatical errors are due to the "[employer]" system interpretation.  Juel Burrow, DNP, [facility] Palliative Medicine Team Team Phone # [phone]  Pager [phone]

## 2022-07-04 NOTE — Progress Notes (Signed)
[facility] Progress Note Patient Name: Travis Wolfe DOB: 27-Dec-1934 MRN: [id number]   Date of Service  07/04/2022  HPI/Events of Note  Patient with concern for aspiration.  eICU Interventions  Stat portable CXR ordered.        Kerry Kass Tikia Skilton 07/04/2022, 3:04 AM

## 2022-07-04 NOTE — Progress Notes (Addendum)
NAME:  Travis Wolfe, MRN:  [id number], DOB:  1935/04/09, LOS: 9 ADMISSION DATE:  06/25/2022, CONSULTATION DATE:  06/28/22 REFERRING MD:  Joycelyn Das, MD CHIEF COMPLAINT:  respiratory failure   History of Present Illness:  Travis Wolfe is an 88 year old male with history of atrial fibrillation on eliquis, hypertension and complex surgical history of the right hip with recent arthroplasty resection with placement of antibiotic spacer and started on daptomycin and ceftriaxone 06/07/22, who was admitted 06/25/22 from SNF with weakness and drop in hemoglobin. He was started on antibiotics for multifocal pneumonia based on chest radiograph on admission.  06/28/21 code stroke was called due to altered mentation and inability to move his upper extremities along with facial asymmetry. CTA head was negative for stroke.   He was more alert and responsive at time of evaluation in the ICU. His wife was at the bedside.   Orthopedics was planning return to OR on 1/13 but has post-poned that procedure at this time.   Pertinent  Medical History   Atrial Fibrillation Hypertension Mitral regurgitation  Significant Hospital Events: Including procedures, antibiotic start and stop dates in addition to other pertinent events   1/9 admitted to [facility] for weakness and anemia. Concern for pneumonia.  1/12 transferred to ICU for code stroke and increasing O2 needs 1/13 remained intubated after ortho hip proceudre, bronchoscopy and PICC line place. 1/14 a-line place, weaned off vasopressors  1/15 remains intubated, poor mental status, family meeting held 1/17 started CRRT, weaned on PS 5/5  Interim History / Subjective:   Remains critically ill, on CRRT. Urine output about 250 cc, 10 L positive. Episode of tube feeds being suctioned from ET tube concern for aspiration Hypertensive this morning   Objective   Blood pressure (!) 112/41, pulse 72, temperature 97.8 F (36.6 C), temperature source Oral,  resp. rate 20, height 5\' 8"  (1.727 m), weight 75.4 kg, SpO2 97 %. CVP:  [4 mmHg-28 mmHg] 5 mmHg  Vent Mode: PSV;CPAP FiO2 (%):  [40 %] 40 % Set Rate:  [18 bmp] 18 bmp Vt Set:  [480 mL] 480 mL PEEP:  [5 cmH20] 5 cmH20 Pressure Support:  [5 cmH20] 5 cmH20 Plateau Pressure:  [20 cmH20-25 cmH20] 23 cmH20   Intake/Output Summary (Last 24 hours) at 07/04/2022 0914 Last data filed at 07/04/2022 0900 Gross per 24 hour  Intake 1595.33 ml  Output 1259 ml  Net 336.33 ml    Filed Weights   07/02/22 0215 07/03/22 0448 07/04/22 0500  Weight: 69.9 kg 77.7 kg 75.4 kg    Examination: General: elderly male, chronically ill appearing, intubated HENT: AT/[place], moist mucous membranes, sclera anicteric Lungs: No accessory muscle use, clear breath sounds bilateral Cardiovascular: rrr, no murmurs Abdomen: soft, non-tender, non-distended, BS+ Extremities: warm, 1+ edema Neuro: Sedate, RASS 0 off sedation, does not follow commands, pupils equal and reactive to light GU: foley with minimal urine   Echo 06/26/22 LV EF 55% RV systolic function and size are normal, but noted to have elevated pulmonary pressures of 08/25/22.   Abdominal 06/30/22 Cholelithiasis and biliary sludge without definitive sonographic evidence of acute cholecystitis. Small volume free fluid.  Labs BUN/creatinine decreased to 95/2.9, normal potassium, slight decrease in leukocytosis, stable anemia  Chest x-ray independently reviewed shows bilateral hazy opacities and mild cardiomegaly, small effusions  Resolved Hospital Problem list   Anion Gap Metabolic Acidosis Lactic Acidosis Hyperkalemia Shock  Assessment & Plan:  Acute Hypoxemic Respiratory Failure Multifocal Pneumonia -RVP , resp cx and  urine legionella neg, 1/17 concern for aspiration ?Daptomycin induced eosinophilic pneumonia - stopped daptomycin 1/12 - continue solumedrol to 40mg  daily, steroids started 1/12 - HCAP coverage with cefepime then zosyn, changed to  meropenem 1/14 - Continue vanc  - f/u fungitell   Septic shock due to Pneumonia vs other source - blood cultures no growth to date - meropenem, vanc and azithro for antibiotic coverage - Currently off vasopressors & midodrine   Acute Kidney Injury, Anuric - avoid nephrotoxic agents - Nephrology following -Based on goals of care conversation, trying CRRT for 3 days starting 1/17  Acute Encephalopathy Change in Pupil sizes 1/16 - 1/16CT head neg - MRI brain 1/13 negative for stroke or hemorrhage - cefepime stopped 1/12 - stopped baclofen 1/13 - reduced steroid dose 1/15  - goal RASS 0 to -1 can switch to Precedex with intermittent fentanyl for pain, wait and watch approach as azotemia decreases CRRT  NSTEMI Pulmonary Hypertension Paroxysmal Atrial Fibrillation Hx of hypertension - elevated troponin and inverted T waves in lateral leads - started heparin/asa 1/13, stoppded 1/14 due to drop in hemoglobin and increase in LA -Start labetalol 100 twice daily use hydralazine as needed  Anemia ?GI bleeding - continue PPI daily - no further reports of bloody BMs -  H/H stable  S/p Right hip arthroplasty resection with antibiotic spacer Right Hip Seroma - OR 1/13 for removal of antibiotic spacer for comfort  Anasarca Moderate Protein Calorie Malnutrition - doboff placed, some concern for aspiration overnight, chest x-ray confirms tube in correct position, restart  trickle tube feeds  Pressure injury right and left buttocks stage 1, present on admission  Pressure Injury 07/01/22 Heel Left Deep Tissue Pressure Injury   Best Practice (right click and "Reselect all SmartList Selections" daily)   Diet/type: tubefeeds DVT prophylaxis: prophylactic heparin  GI prophylaxis: PPI Lines: Central line and Arterial Line Foley:  Yes, and it is still needed Code Status:  DNR Last date of multidisciplinary goals of care discussion [Discussed with patient's wife at bedside 1/14] 1/17  ongoing discussion with wife about dialysis, he had indicated that he did not want dialysis in the past, she agreed for temporary trial of CRRT for 3 days  Labs   CBC: Recent Labs  Lab 06/29/22 0418 06/30/22 0515 06/30/22 1455 06/30/22 2100 07/01/22 0550 07/01/22 0800 07/02/22 0407 07/03/22 0436 07/04/22 0411  WBC 7.0   < > 8.4   < > 11.6* 11.9* 14.1* 17.0* 15.0*  NEUTROABS 6.2  --  7.0  --   --   --  12.6* 15.1* 13.8*  HGB 9.6*   < > 9.0*   < > 8.5* 8.5* 9.2* 10.0* 10.6*  HCT 31.2*   < > 28.9*   < > 25.9* 26.2* 28.4* 31.4* 32.7*  MCV 96.3   < > 95.4   < > 89.9 91.9 90.2 92.4 92.4  PLT 256   < > 153   < > 135* 137* 148* 189 178   < > = values in this interval not displayed.     Basic Metabolic Panel: Recent Labs  Lab 06/28/22 0155 06/28/22 1430 06/29/22 0418 06/30/22 0515 06/30/22 1455 07/01/22 0550 07/02/22 0407 07/03/22 0436 07/03/22 0747 07/03/22 1018 07/04/22 0411  NA 137 137   < > 138   < > 137 135 138 135  --  138  137  K 3.2* 4.4   < > 5.4*   < > 4.3 4.8 6.0* 5.6* 5.6* 4.5  4.2  CL 106  104   < > 101   < > 101 103 100 102  --  103  105  CO2 21* 24   < > 17*   < > 24 19* 21* 19*  --  23  23  GLUCOSE 137* 136*   < > 196*   < > 142* 158* 147* 319*  --  156*  152*  BUN 27* 36*   < > 59*   < > 84* 96* 125* 124*  --  99*  95*  CREATININE 1.03 0.88   < > 2.29*   < > 2.92* 3.55* 4.59* 4.38*  --  3.15*  2.91*  CALCIUM 8.1* 8.1*   < > 8.9   < > 8.7* 8.4* 8.9 8.2*  --  8.2*  7.8*  MG 1.9 3.0*  --  3.1*  --   --  3.0*  --   --   --  2.8*  PHOS  --  3.8  --  8.1*  --   --  4.3  --   --   --  4.6   < > = values in this interval not displayed.    GFR: Estimated Creatinine Clearance: 18 mL/min (A) (by C-G formula based on SCr of 2.91 mg/dL (H)). Recent Labs  Lab 06/28/22 1126 06/28/22 1147 06/29/22 0439 06/29/22 1552 06/30/22 0515 06/30/22 1455 06/30/22 1735 06/30/22 2100 07/01/22 0800 07/02/22 0407 07/03/22 0436 07/04/22 0411  PROCALCITON 7.46  --   7.74  --   --   --   --   --   --   --   --   --   WBC  --    < >  --   --  12.7* 8.4  --    < > 11.9* 14.1* 17.0* 15.0*  LATICACIDVEN  --    < >  --    < > 6.6* 5.2* 3.7*  --  1.6  --   --   --    < > = values in this interval not displayed.     Liver Function Tests: Recent Labs  Lab 06/29/22 1342 06/30/22 0515 07/01/22 0550 07/02/22 0407 07/03/22 0436 07/04/22 0411  AST 49* 213* 310* 125* 68*  --   ALT 28 87* 211* 150* 115*  --   ALKPHOS 80 62 65 74 74  --   BILITOT 1.1 1.6* 2.1* 1.5* 1.4*  --   PROT 6.1* 6.7 5.1* 5.1* 4.8*  --   ALBUMIN 2.4* 3.4* 2.9* 2.5* 2.3* 2.3*    No results for input(s): "LIPASE", "AMYLASE" in the last 168 hours. No results for input(s): "AMMONIA" in the last 168 hours.  ABG    Component Value Date/Time   PHART 7.45 07/02/2022 0833   PCO2ART 29 (L) 07/02/2022 0833   PO2ART 85 07/02/2022 0833   HCO3 20.2 07/02/2022 0833   TCO2 24 06/06/2022 1213   ACIDBASEDEF 2.6 (H) 07/02/2022 0833   O2SAT 98.1 07/02/2022 0833     Coagulation Profile: Recent Labs  Lab 07/01/22 0816  INR 1.7*     Cardiac Enzymes: No results for input(s): "CKTOTAL", "CKMB", "CKMBINDEX", "TROPONINI" in the last 168 hours.  HbA1C: Hgb A1c MFr Bld  Date/Time Value Ref Range Status  06/30/2022 05:15 AM 4.8 4.8 - 5.6 % Final    Comment:    (NOTE) Pre diabetes:          5.7%-6.4%  Diabetes:              >  6.4%  Glycemic control for   <7.0% adults with diabetes     CBG: Recent Labs  Lab 07/03/22 2007 07/03/22 2312 07/04/22 0334 07/04/22 0337 07/04/22 0823  GLUCAP 128* 129* 167* 149* 146*      Critical care time:  38 minutes   Aldin Drees V. Elsworth Soho MD [facility] Office: [phone]   See Amion for personal pager PCCM on call pager [phone] until 7pm. Please call Elink 7p-7a. [phone]

## 2022-07-04 NOTE — Progress Notes (Signed)
SLP Cancellation Note  Patient Details Name: SERAPIO EDELSON MRN: [id number] DOB: 1934-12-19   Cancelled treatment:       Reason Eval/Treat Not Completed: Other (comment) (pt remains on vent, will sign off; please reorder if indicated)  [name], MS [place] Office [phone] Pager [phone]  [name] 07/04/2022, 5:15 PM

## 2022-07-04 NOTE — Progress Notes (Signed)
Nutrition Follow-up  DOCUMENTATION CODES:   Non-severe (moderate) malnutrition in context of chronic illness  INTERVENTION:  - Restarting trickle feeds today.   - Once medically appropriate, advance back to goal TF as below: Vital 1.5 at 55 ml/h (1320 ml per day) Provides 1980 kcal, 89 gm protein, 1008 ml free water daily   - Monitor magnesium, potassium, and phosphorus for at least 3 days, MD to replete as needed. - Monitor weight trend  NUTRITION DIAGNOSIS:   Moderate Malnutrition related to chronic illness as evidenced by moderate muscle depletion, moderate fat depletion. *ongoing  GOAL:   Patient will meet greater than or equal to 90% of their needs *to be met with TF  MONITOR:   PO intake, Supplement acceptance, Weight trends  REASON FOR ASSESSMENT:   Consult Assessment of nutrition requirement/status  ASSESSMENT:   88 y.o. 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 No family at bedside this morning at time of visit.   TF stopped overnight due to concern for aspiration. Per CCM, plan to restart tube feeds today at 45mL/hr.  Patient started on CRRT 1/17 however palliative care following and plan for 72 hour trail as patient has previously reported he would not want long term HD.   Medications reviewed and include: 2000 units vitamin D, MVI, 220mg  zinc, Colace, Insulin, Miralax, Vancomycin  Labs  reviewed:  Creatinine 2.91-3.15 Magnesium 2.8   Diet Order:   Diet Order             Diet NPO time specified  Diet effective ____                   EDUCATION NEEDS:  Education needs have been addressed  Skin:  Skin Assessment: Reviewed RN Assessment Skin Integrity Issues:: Stage I Stage I: Mid Buttocks  Last BM:  1/17  Height:  Ht Readings from Last 1 Encounters:  07/03/22 5\' 8"  (1.727 m)   Weight:  Wt Readings from Last 1 Encounters:  07/04/22 75.4 kg   Ideal Body Weight:  70 kg  BMI:  Body mass index is 25.27 kg/m.  Estimated Nutritional Needs:  Kcal:  1800-2000 kcals Protein:  85-100 grams Fluid:  >/= 1.8L    Samson Frederic RD, LDN For contact information, refer to [facility].

## 2022-07-04 NOTE — Progress Notes (Signed)
[facility] Progress Note   Assessment/ Plan:    AKI: likely multifactorial in the setting of sepsis/ CTA/ low Bps/ pressor requirements.  Vanc level supratherapuetic today at 25  Would be unusual for antibiotic spacer to provide enough systemic absorption to cause AKI             - off midodrine             - long discussion with Altha Harm (POA) and mulitple family members 1/17-1/18  - CRRT unlikely to restore pt to baseline level of health in setting of functional decline and now critical illness with MSOF- have decided to pursue 48-72 hr trial.  Change to 4K, increase fluid removal today    - palliative consulted- appreciate assistance   2.  Acute hypoxic RF             - intubated             - on meropenem/ vanc/ azithro   3.  R hip infection             - s/p spacer with ortho 06/29/22   4.  Anemia:             - transfuse prn  5.  Change in pupil size  - CT head 1/16 negative  6.  Dispo: palliative consulted, appreciate assistance    Subjective:    Seen in room.  On CRRT.  Looks like a little more urine in the bag compared to yesterday   Objective:   BP (!) 112/41   Pulse 84   Temp 97.7 F (36.5 C) (Oral)   Resp (!) 21   Ht 5\' 8"  (1.727 m)   Wt 75.4 kg   SpO2 97%   BMI 25.27 kg/m   Intake/Output Summary (Last 24 hours) at 07/04/2022 1321 Last data filed at 07/04/2022 1300 Gross per 24 hour  Intake 1609.07 ml  Output 1919 ml  Net -309.93 ml   Weight change: -2.3 kg  Physical Exam: GEN ill-appearing, in bed HEENT ETT in place, RT suctioning NECK + JVD PULM coarse mechanical bilaterally CV RRR ABD soft EXT 2+ anasarca NEURO intubated and sedated SKIN multiple weeping areas  Imaging: DG Chest Port 1 View  Result Date: 07/04/2022 CLINICAL DATA:  Acute respiratory failure, aspiration into airway. EXAM: PORTABLE CHEST 1 VIEW COMPARISON:  07/03/2022. FINDINGS: The heart size and mediastinal contours are stable. Hazy airspace opacities are  noted in the lungs bilaterally, greater on the right than left. There are small bilateral pleural effusions. No pneumothorax. An enteric tube terminates in the stomach. An endotracheal tube terminates 3 cm above the carina. Bilateral PICC lines and left internal jugular central venous catheter are stable. IMPRESSION: 1. Hazy airspace opacities bilaterally, unchanged. 2. Small bilateral pleural effusions. 3. Support apparatus as described above. Electronically Signed   By: Brett Fairy M.D.   On: 07/04/2022 03:28   DG CHEST PORT 1 VIEW  Result Date: 07/03/2022 CLINICAL DATA:  Central line placement EXAM: PORTABLE CHEST 1 VIEW COMPARISON:  Chest x-ray dated July 01, 2022 FINDINGS: Stable position of ETT and feeding tube. Interval placement of left IJ line with tip projecting over the expected area of the mid SVC. Unchanged position of bilateral PICC lines. Cardiac and mediastinal contours are unchanged. Unchanged diffuse interstitial opacities and small bilateral pleural effusions. No evidence of pneumothorax. IMPRESSION: 1. Interval placement of left IJ line with tip projecting over the expected area of the mid SVC.  2. Unchanged diffuse interstitial opacities and small bilateral pleural effusions, likely due to pulmonary edema. Electronically Signed   By: Yetta Glassman M.D.   On: 07/03/2022 17:41    Labs: BMET Recent Labs  Lab 06/28/22 1430 06/29/22 0418 06/30/22 0515 06/30/22 1455 07/01/22 0550 07/02/22 0407 07/03/22 0436 07/03/22 0747 07/03/22 1018 07/04/22 0411  NA 137   < > 138 138 137 135 138 135  --  138  137  K 4.4   < > 5.4* 4.6 4.3 4.8 6.0* 5.6* 5.6* 4.5  4.2  CL 104   < > 101 102 101 103 100 102  --  103  105  CO2 24   < > 17* 19* 24 19* 21* 19*  --  23  23  GLUCOSE 136*   < > 196* 232* 142* 158* 147* 319*  --  156*  152*  BUN 36*   < > 59* 67* 84* 96* 125* 124*  --  99*  95*  CREATININE 0.88   < > 2.29* 2.49* 2.92* 3.55* 4.59* 4.38*  --  3.15*  2.91*  CALCIUM 8.1*    < > 8.9 8.3* 8.7* 8.4* 8.9 8.2*  --  8.2*  7.8*  PHOS 3.8  --  8.1*  --   --  4.3  --   --   --  4.6   < > = values in this interval not displayed.   CBC Recent Labs  Lab 06/30/22 1455 06/30/22 2100 07/01/22 0800 07/02/22 0407 07/03/22 0436 07/04/22 0411  WBC 8.4   < > 11.9* 14.1* 17.0* 15.0*  NEUTROABS 7.0  --   --  12.6* 15.1* 13.8*  HGB 9.0*   < > 8.5* 9.2* 10.0* 10.6*  HCT 28.9*   < > 26.2* 28.4* 31.4* 32.7*  MCV 95.4   < > 91.9 90.2 92.4 92.4  PLT 153   < > 137* 148* 189 178   < > = values in this interval not displayed.    Medications:     Chlorhexidine Gluconate Cloth  6 each Topical Daily   cholecalciferol  2,000 Units Oral Daily   docusate  100 mg Per Tube BID   dorzolamide-timolol  1 drop Both Eyes BID   heparin  5,000 Units Subcutaneous Q8H   insulin aspart  0-9 Units Subcutaneous Q4H   labetalol  100 mg Per Tube BID   leptospermum manuka honey  1 Application Topical Daily   methylPREDNISolone (SOLU-MEDROL) injection  40 mg Intravenous Q24H   multivitamin with minerals  1 tablet Oral Daily   mouth rinse  15 mL Mouth Rinse Q2H   pantoprazole  40 mg Oral Q1200   polyethylene glycol  17 g Per Tube Daily   sodium bicarbonate  650 mg Per Tube TID   sodium chloride flush  10-40 mL Intracatheter Q12H   sodium chloride flush  10-40 mL Intracatheter Q12H   zinc sulfate  220 mg Oral Daily    Madelon Lips MD 07/04/2022, 1:21 PM

## 2022-07-05 DIAGNOSIS — D649 Anemia, unspecified: Secondary | ICD-10-CM | POA: Diagnosis not present

## 2022-07-05 DIAGNOSIS — Z7189 Other specified counseling: Secondary | ICD-10-CM | POA: Diagnosis not present

## 2022-07-05 DIAGNOSIS — Z9911 Dependence on respirator [ventilator] status: Secondary | ICD-10-CM | POA: Diagnosis not present

## 2022-07-05 DIAGNOSIS — N179 Acute kidney failure, unspecified: Secondary | ICD-10-CM | POA: Diagnosis not present

## 2022-07-05 DIAGNOSIS — Z515 Encounter for palliative care: Secondary | ICD-10-CM | POA: Diagnosis not present

## 2022-07-05 LAB — RENAL FUNCTION PANEL
Albumin: 2.4 g/dL — ABNORMAL LOW (ref 3.5–5.0)
Albumin: 2.4 g/dL — ABNORMAL LOW (ref 3.5–5.0)
Anion gap: 7 (ref 5–15)
Anion gap: 10 (ref 5–15)
BUN: 39 mg/dL — ABNORMAL HIGH (ref 8–23)
BUN: 27 mg/dL — ABNORMAL HIGH (ref 8–23)
CO2: 24 mmol/L (ref 22–32)
CO2: 24 mmol/L (ref 22–32)
Calcium: 7.8 mg/dL — ABNORMAL LOW (ref 8.9–10.3)
Calcium: 8 mg/dL — ABNORMAL LOW (ref 8.9–10.3)
Chloride: 103 mmol/L (ref 98–111)
Chloride: 102 mmol/L (ref 98–111)
Creatinine, Ser: 1.23 mg/dL (ref 0.61–1.24)
Creatinine, Ser: 0.93 mg/dL (ref 0.61–1.24)
GFR, Estimated: 58 mL/min — ABNORMAL LOW (ref >60)
GFR, Estimated: >60 mL/min (ref >60)
Glucose, Bld: 137 mg/dL — ABNORMAL HIGH (ref 70–99)
Glucose, Bld: 123 mg/dL — ABNORMAL HIGH (ref 70–99)
Phosphorus: 3.5 mg/dL (ref 2.5–4.6)
Phosphorus: 2.5 mg/dL (ref 2.5–4.6)
Potassium: 4.4 mmol/L (ref 3.5–5.1)
Potassium: 4.4 mmol/L (ref 3.5–5.1)
Sodium: 134 mmol/L — ABNORMAL LOW (ref 135–145)
Sodium: 136 mmol/L (ref 135–145)

## 2022-07-05 LAB — MAGNESIUM: Magnesium: 2.7 mg/dL — ABNORMAL HIGH (ref 1.7–2.4)

## 2022-07-05 LAB — GLUCOSE, CAPILLARY
Glucose-Capillary: 129 mg/dL — ABNORMAL HIGH (ref 70–99)
Glucose-Capillary: 123 mg/dL — ABNORMAL HIGH (ref 70–99)
Glucose-Capillary: 149 mg/dL — ABNORMAL HIGH (ref 70–99)
Glucose-Capillary: 145 mg/dL — ABNORMAL HIGH (ref 70–99)
Glucose-Capillary: 126 mg/dL — ABNORMAL HIGH (ref 70–99)

## 2022-07-05 LAB — CREATININE, SERUM
Creatinine, Ser: 1.22 mg/dL (ref 0.61–1.24)
GFR, Estimated: 58 mL/min — ABNORMAL LOW (ref >60)

## 2022-07-05 MED ORDER — ZINC OXIDE 40 % EX OINT
TOPICAL_OINTMENT | CUTANEOUS | Status: DC | PRN
  Filled 2022-07-05 (×2): qty 57

## 2022-07-05 NOTE — Progress Notes (Signed)
[facility] Progress Note   Assessment/ Plan:    AKI: likely multifactorial in the setting of sepsis/ CTA/ low Bps/ pressor requirements.  Vanc level supratherapuetic today at 18  Would be unusual for antibiotic spacer to provide enough systemic absorption to cause AKI             - off midodrine             - long discussion with Altha Harm (POA) and mulitple family members 1/17-1/18  - CRRT unlikely to restore pt to baseline level of health in setting of functional decline and now critical illness with MSOF- have decided to pursue 48-72 hr trial.  4K bath  - palliative consulted- appreciate assistance   2.  Acute hypoxic RF             - intubated             - on meropenem/ vanc/ azithro   3.  R hip infection             - s/p spacer with ortho 06/29/22   4.  Anemia:             - transfuse prn  5.  Change in pupil size  - CT head 1/16 negative  6.  Dispo: palliative consulted, appreciate assistance    Subjective:    On CRRT, continuing.     Objective:   BP (!) 130/46   Pulse 72   Temp 98.2 F (36.8 C) (Axillary)   Resp (!) 22   Ht 5\' 8"  (1.727 m)   Wt 75.4 kg   SpO2 100%   BMI 25.27 kg/m   Intake/Output Summary (Last 24 hours) at 07/05/2022 1505 Last data filed at 07/05/2022 1500 Gross per 24 hour  Intake 1210.65 ml  Output 4566 ml  Net -3355.35 ml   Weight change:   Physical Exam: GEN ill-appearing, in bed HEENT ETT in place, PULM coarse mechanical bilaterally CV RRR ABD soft EXT 2+ anasarca NEURO intubated and sedated SKIN multiple weeping areas ACCESS: nontunneled HD cath  Imaging: DG Chest Port 1 View  Result Date: 07/04/2022 CLINICAL DATA:  Acute respiratory failure, aspiration into airway. EXAM: PORTABLE CHEST 1 VIEW COMPARISON:  07/03/2022. FINDINGS: The heart size and mediastinal contours are stable. Hazy airspace opacities are noted in the lungs bilaterally, greater on the right than left. There are small bilateral pleural  effusions. No pneumothorax. An enteric tube terminates in the stomach. An endotracheal tube terminates 3 cm above the carina. Bilateral PICC lines and left internal jugular central venous catheter are stable. IMPRESSION: 1. Hazy airspace opacities bilaterally, unchanged. 2. Small bilateral pleural effusions. 3. Support apparatus as described above. Electronically Signed   By: Brett Fairy M.D.   On: 07/04/2022 03:28   DG CHEST PORT 1 VIEW  Result Date: 07/03/2022 CLINICAL DATA:  Central line placement EXAM: PORTABLE CHEST 1 VIEW COMPARISON:  Chest x-ray dated July 01, 2022 FINDINGS: Stable position of ETT and feeding tube. Interval placement of left IJ line with tip projecting over the expected area of the mid SVC. Unchanged position of bilateral PICC lines. Cardiac and mediastinal contours are unchanged. Unchanged diffuse interstitial opacities and small bilateral pleural effusions. No evidence of pneumothorax. IMPRESSION: 1. Interval placement of left IJ line with tip projecting over the expected area of the mid SVC. 2. Unchanged diffuse interstitial opacities and small bilateral pleural effusions, likely due to pulmonary edema. Electronically Signed   By: Yetta Glassman  M.D.   On: 07/03/2022 17:41    Labs: BMET Recent Labs  Lab 06/30/22 0515 06/30/22 1455 07/01/22 0550 07/02/22 0407 07/03/22 0436 07/03/22 0747 07/03/22 1018 07/04/22 0411 07/04/22 1511 07/05/22 0408  NA 138   < > 137 135 138 135  --  138  137 138 134*  K 5.4*   < > 4.3 4.8 6.0* 5.6* 5.6* 4.5  4.2 4.1 4.4  CL 101   < > 101 103 100 102  --  103  105 101 103  CO2 17*   < > 24 19* 21* 19*  --  23  23 25 24   GLUCOSE 196*   < > 142* 158* 147* 319*  --  156*  152* 120* 137*  BUN 59*   < > 84* 96* 125* 124*  --  99*  95* 60* 39*  CREATININE 2.29*   < > 2.92* 3.55* 4.59* 4.38*  --  3.15*  2.91* 1.71* 1.23  1.22  CALCIUM 8.9   < > 8.7* 8.4* 8.9 8.2*  --  8.2*  7.8* 8.1* 7.8*  PHOS 8.1*  --   --  4.3  --   --   --   4.6 3.6 3.5   < > = values in this interval not displayed.   CBC Recent Labs  Lab 06/30/22 1455 06/30/22 2100 07/01/22 0800 07/02/22 0407 07/03/22 0436 07/04/22 0411  WBC 8.4   < > 11.9* 14.1* 17.0* 15.0*  NEUTROABS 7.0  --   --  12.6* 15.1* 13.8*  HGB 9.0*   < > 8.5* 9.2* 10.0* 10.6*  HCT 28.9*   < > 26.2* 28.4* 31.4* 32.7*  MCV 95.4   < > 91.9 90.2 92.4 92.4  PLT 153   < > 137* 148* 189 178   < > = values in this interval not displayed.    Medications:     Chlorhexidine Gluconate Cloth  6 each Topical Daily   cholecalciferol  2,000 Units Oral Daily   docusate  100 mg Per Tube BID   dorzolamide-timolol  1 drop Both Eyes BID   heparin  5,000 Units Subcutaneous Q8H   insulin aspart  0-9 Units Subcutaneous Q4H   labetalol  100 mg Per Tube BID   leptospermum manuka honey  1 Application Topical Daily   methylPREDNISolone (SOLU-MEDROL) injection  40 mg Intravenous Q24H   multivitamin with minerals  1 tablet Oral Daily   mouth rinse  15 mL Mouth Rinse Q2H   pantoprazole  40 mg Oral Q1200   polyethylene glycol  17 g Per Tube Daily   sodium bicarbonate  650 mg Per Tube TID   sodium chloride flush  10-40 mL Intracatheter Q12H   sodium chloride flush  10-40 mL Intracatheter Q12H   zinc sulfate  220 mg Oral Daily    Madelon Lips MD 07/05/2022, 3:05 PM

## 2022-07-05 NOTE — Progress Notes (Signed)
RUE PICC removal: site unremarkable, vaseline gauze and pressure dressing applied. Dressing to remain intact x 24 hours.

## 2022-07-05 NOTE — Progress Notes (Signed)
Daily Progress Note   Patient Name: Travis Wolfe       Date: 07/05/2022 DOB: 10/17/1934  Age: 88 y.o. MRN#: [id number] Attending Physician: Marshell Garfinkel, MD Primary Care Physician: Derrill Center., MD Admit Date: 06/25/2022  Reason for Consultation/Follow-up: Establishing goals of care  Subjective: Remains unresponsive on ventilator   Length of Stay: 10  Current Medications: Scheduled Meds:   Chlorhexidine Gluconate Cloth  6 each Topical Daily   cholecalciferol  2,000 Units Oral Daily   docusate  100 mg Per Tube BID   dorzolamide-timolol  1 drop Both Eyes BID   heparin  5,000 Units Subcutaneous Q8H   insulin aspart  0-9 Units Subcutaneous Q4H   labetalol  100 mg Per Tube BID   leptospermum manuka honey  1 Application Topical Daily   methylPREDNISolone (SOLU-MEDROL) injection  40 mg Intravenous Q24H   multivitamin with minerals  1 tablet Oral Daily   mouth rinse  15 mL Mouth Rinse Q2H   pantoprazole  40 mg Oral Q1200   polyethylene glycol  17 g Per Tube Daily   sodium bicarbonate  650 mg Per Tube TID   sodium chloride flush  10-40 mL Intracatheter Q12H   sodium chloride flush  10-40 mL Intracatheter Q12H   zinc sulfate  220 mg Oral Daily    Continuous Infusions:   prismasol BGK 4/2.5 500 mL/hr at 07/05/22 1005    prismasol BGK 4/2.5 400 mL/hr at 07/05/22 1510   sodium chloride     sodium chloride Stopped (07/02/22 1245)   sodium chloride 10 mL/hr at 07/05/22 1500   feeding supplement (VITAL 1.5 CAL) 30 mL/hr at 07/05/22 1500   meropenem (MERREM) IV Stopped (07/05/22 1432)   prismasol BGK 4/2.5 2,000 mL/hr at 07/05/22 1510   vancomycin Stopped (07/05/22 1111)    PRN Meds: Place/Maintain arterial line **AND** sodium chloride, sodium chloride, sodium chloride, acetaminophen  **OR** acetaminophen, albuterol, fentaNYL (SUBLIMAZE) injection, fentaNYL (SUBLIMAZE) injection, heparin sodium (porcine), hydrALAZINE, ipratropium-albuterol, lip balm, mouth rinse, sodium chloride flush, sodium chloride flush  Physical Exam Constitutional:      General: He is not in acute distress.    Appearance: He is ill-appearing.     Comments: Remains on ventilator - sedation off, unresponsive  Pulmonary:     Comments: Remains on vent            Vital Signs: BP (!) 130/46   Pulse 72   Temp 98.2 F (36.8 C) (Axillary)   Resp (!) 22   Ht 5\' 8"  (1.727 m)   Wt 75.4 kg   SpO2 100%   BMI 25.27 kg/m  SpO2: SpO2: 100 % O2 Device: O2 Device: Ventilator O2 Flow Rate: O2 Flow Rate (L/min): 15 L/min  Intake/output summary:  Intake/Output Summary (Last 24 hours) at 07/05/2022 1511 Last data filed at 07/05/2022 1500 Gross per 24 hour  Intake 1210.65 ml  Output 4566 ml  Net -3355.35 ml    LBM: Last BM Date : 07/05/22 Baseline Weight: Weight: 67.1 kg Most recent weight: Weight: 75.4 kg   Patient Active Problem List   Diagnosis Date Noted   AKI (acute kidney injury) ([place]) 07/03/2022   Pressure injury of skin 07/03/2022  Malnutrition of moderate degree 06/27/2022   Symptomatic anemia 06/25/2022   Anasarca 06/25/2022   HTN (hypertension) 06/25/2022   HLD (hyperlipidemia) 06/25/2022   Multifocal pneumonia 06/25/2022   Hypocalcemia 06/25/2022   Postoperative seroma of subcutaneous tissue after non-dermatologic procedure 05/02/2022   Hematoma of hip, right, subsequent encounter 01/31/2022   Hematoma of hip, right, sequela 01/31/2022   History of revision of total replacement of right hip joint 05/17/2021   Arthrofibrosis of hip joint, right 11/26/2018   Pain    Fever 02/20/2018   Metallosis    Staphylococcus infection    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 male  with past medical history of atrial fibrillation on eliquis, hypertension, mitral regurgitation, and complex surgical history of the right hip with recent arthroplasty resection with placement of antibiotic spacer and started on daptomycin and ceftriaxone 06/07/22 admitted on 06/25/2022 with weakness and anemia. Treated for pneumonia. 1/12 required transfer to ICU for code stroke - imaging negative.  1/13 had hip procedure and remained intubated afterward. Patient with AKI and meets criteria for CRRT. PMT consulted to discuss [place].   Assessment: Wife not at bedside. Received update from RN - off sedation yet remains unresponsive. No issues with CRRT.  Planning for family meeting with CCM tomorrow.   Call to wife, Altha Harm. No answer. Voicemail left with call back number.   Recommendations/Plan: Time for outcomes: currently undergoing ~72 hour trial of CRRT initiated evening of 1/17 - would not want long term H  Code Status: DNR  Care plan was discussed with RN  Thank you for allowing the Palliative Medicine Team to assist in the care of this patient.  *Please note that this is a verbal dictation therefore any spelling or grammatical errors are due to the "[employer]" system interpretation.  Juel Burrow, DNP, [facility] Palliative Medicine Team Team Phone # [phone]  Pager [phone]

## 2022-07-05 NOTE — Progress Notes (Signed)
Brief Nutrition Note  Patient has remained on trickle feeds since possible aspiration event on 1/18.  Per discussion with CCM MD, can start advancing TF back to goal today.  Can try advancing by 67mL Q4H for better tolerance. Discussed with RN.  Vital 1.5 at 55 ml/h (1320 ml per day) Provides 1980 kcal, 89 gm protein, 1008 ml free water daily   Samson Frederic RD, LDN For contact information, refer to [facility].

## 2022-07-05 NOTE — Progress Notes (Signed)
NAME:  Travis Wolfe, MRN:  [id number], DOB:  [birth year], LOS: 15 ADMISSION DATE:  06/25/2022, CONSULTATION DATE:  06/28/22 REFERRING MD:  Flora Lipps, MD CHIEF COMPLAINT:  respiratory failure   History of Present Illness:  Travis Wolfe is an 88 year old male with history of atrial fibrillation on eliquis, hypertension and complex surgical history of the right hip with recent arthroplasty resection with placement of antibiotic spacer and started on daptomycin and ceftriaxone 06/07/22, who was admitted 06/25/22 from SNF with weakness and drop in hemoglobin. He was started on antibiotics for multifocal pneumonia based on chest radiograph on admission.  06/28/21 code stroke was called due to altered mentation and inability to move his upper extremities along with facial asymmetry. CTA head was negative for stroke.   He was more alert and responsive at time of evaluation in the ICU. His wife was at the bedside.   Orthopedics was planning return to OR on 1/13 but has post-poned that procedure at this time.   Pertinent  Medical History   Atrial Fibrillation Hypertension Mitral regurgitation  Significant Hospital Events: Including procedures, antibiotic start and stop dates in addition to other pertinent events   1/9 admitted to [facility] for weakness and anemia. Concern for pneumonia.  1/12 transferred to ICU for code stroke and increasing O2 needs 1/13 remained intubated after ortho hip proceudre, bronchoscopy and PICC line place. 1/14 a-line place, weaned off vasopressors  1/15 remains intubated, poor mental status, family meeting held 1/17 started CRRT, weaned on PS 5/5  Interim History / Subjective:   Remains on CRRT.  Weaning on pressure support mode but mental status is poor   Objective   Blood pressure (!) 130/46, pulse 72, temperature 98.3 F (36.8 C), temperature source Axillary, resp. rate (!) 25, height 5\' 8"  (1.727 m), weight 75.4 kg, SpO2 99 %.    Vent Mode: PSV;CPAP FiO2  (%):  [40 %] 40 % Set Rate:  [18 bmp] 18 bmp Vt Set:  [480 mL] 480 mL PEEP:  [5 cmH20] 5 cmH20 Pressure Support:  [5 cmH20] 5 cmH20 Plateau Pressure:  [18 cmH20-20 cmH20] 20 cmH20   Intake/Output Summary (Last 24 hours) at 07/05/2022 0908 Last data filed at 07/05/2022 0900 Gross per 24 hour  Intake 1140.82 ml  Output 4273 ml  Net -3132.18 ml   Filed Weights   07/02/22 0215 07/03/22 0448 07/04/22 0500  Weight: 69.9 kg 77.7 kg 75.4 kg    Examination: Gen:      No acute distress, frail, elderly, malnourished HEENT:  EOMI, sclera anicteric Neck:     No masses; no thyromegaly ET tube Lungs:    Clear to auscultation bilaterally; normal respiratory effort CV:         Regular rate and rhythm; no murmurs Abd:      + bowel sounds; soft, non-tender; no palpable masses, no distension Ext:    No edema; adequate peripheral perfusion Skin:      Warm and dry; no rash Neuro: Unresponsive   Echo 06/26/22 LV EF 11% RV systolic function and size are normal, but noted to have elevated pulmonary pressures of 74mmHg.   Abdominal US 06/30/22 Cholelithiasis and biliary sludge without definitive sonographic evidence of acute cholecystitis. Small volume free fluid.  Labs Significant for sodium 134, BUN/creatinine 39/1.23 WBC 15, hemoglobin 10.6, platelets 178  Resolved Hospital Problem list   Anion Gap Metabolic Acidosis Lactic Acidosis Hyperkalemia Shock  Assessment & Plan:  Acute Hypoxemic Respiratory Failure Multifocal Pneumonia -RVP , resp  cx and urine legionella neg, 1/17 concern for aspiration ?Daptomycin induced eosinophilic pneumonia Daptomycin stopped on 1/12 Continue Solu-Medrol at current dose of 40 mg Broad antibiotic coverage with meropenem and vancomycin Fungitell ordered.   Septic shock due to Pneumonia vs other source Follow cultures Antibiotics as above Currently off pressors  Acute Kidney Injury, Anuric Continuing CRRT for 72 hours  Acute Encephalopathy Change in  Pupil sizes 1/16 - 1/16CT head neg - MRI brain 1/13 negative for stroke or hemorrhage - cefepime stopped 1/12 - stopped baclofen 1/13 - reduced steroid dose 1/15  - Monitor off sedation  NSTEMI Pulmonary Hypertension Paroxysmal Atrial Fibrillation Hx of hypertension - elevated troponin and inverted T waves in lateral leads - started heparin/asa 1/13, stoppded 1/14 due to drop in hemoglobin and increase in LA Continue labetalol, as needed hydralazine  Anemia ?GI bleeding - continue PPI daily - no further reports of bloody BMs -  H/H stable  S/p Right hip arthroplasty resection with antibiotic spacer Right Hip Seroma - OR 1/13 for removal of antibiotic spacer for comfort  Anasarca Moderate Protein Calorie Malnutrition Tube feeds  Pressure injury right and left buttocks stage 1, present on admission  Pressure Injury 07/01/22 Heel Left Deep Tissue Pressure Injury   Best Practice (right click and "Reselect all SmartList Selections" daily)   Diet/type: tubefeeds DVT prophylaxis: prophylactic heparin  GI prophylaxis: PPI Lines: Central line and Arterial Line Foley:  Yes, and it is still needed Code Status:  DNR Last date of multidisciplinary goals of care discussion [Discussed with patient's wife at bedside 1/14] 1/17 ongoing discussion with wife about dialysis, he had indicated that he did not want dialysis in the past, she agreed for temporary trial of CRRT for 3 days  Family updated again at bedside 1/19  Critical care time:     The patient is critically ill with multiple organ system failure and requires high complexity decision making for assessment and support, frequent evaluation and titration of therapies, advanced monitoring, review of radiographic studies and interpretation of complex data.   Critical Care Time devoted to patient care services, exclusive of separately billable procedures, described in this note is 35 minutes.   Marshell Garfinkel MD [place]  Pulmonary & Critical care See Amion for pager  If no response to pager , please call [phone] until 7pm After 7:00 pm call Elink  [phone] 07/05/2022, 9:08 AM

## 2022-07-06 DIAGNOSIS — D649 Anemia, unspecified: Secondary | ICD-10-CM | POA: Diagnosis not present

## 2022-07-06 LAB — RENAL FUNCTION PANEL
Albumin: 2.2 g/dL — ABNORMAL LOW (ref 3.5–5.0)
Albumin: 2.4 g/dL — ABNORMAL LOW (ref 3.5–5.0)
Anion gap: 5 (ref 5–15)
Anion gap: 5 (ref 5–15)
BUN: 33 mg/dL — ABNORMAL HIGH (ref 8–23)
BUN: 23 mg/dL (ref 8–23)
CO2: 26 mmol/L (ref 22–32)
CO2: 27 mmol/L (ref 22–32)
Calcium: 7.6 mg/dL — ABNORMAL LOW (ref 8.9–10.3)
Calcium: 7.9 mg/dL — ABNORMAL LOW (ref 8.9–10.3)
Chloride: 103 mmol/L (ref 98–111)
Chloride: 103 mmol/L (ref 98–111)
Creatinine, Ser: 1.11 mg/dL (ref 0.61–1.24)
Creatinine, Ser: 0.83 mg/dL (ref 0.61–1.24)
GFR, Estimated: >60 mL/min (ref >60)
GFR, Estimated: >60 mL/min (ref >60)
Glucose, Bld: 160 mg/dL — ABNORMAL HIGH (ref 70–99)
Glucose, Bld: 136 mg/dL — ABNORMAL HIGH (ref 70–99)
Phosphorus: 2.8 mg/dL (ref 2.5–4.6)
Phosphorus: 1.7 mg/dL — ABNORMAL LOW (ref 2.5–4.6)
Potassium: 4.5 mmol/L (ref 3.5–5.1)
Potassium: 4.5 mmol/L (ref 3.5–5.1)
Sodium: 134 mmol/L — ABNORMAL LOW (ref 135–145)
Sodium: 135 mmol/L (ref 135–145)

## 2022-07-06 LAB — MAGNESIUM
Magnesium: 2.7 mg/dL — ABNORMAL HIGH (ref 1.7–2.4)
Magnesium: 2.5 mg/dL — ABNORMAL HIGH (ref 1.7–2.4)

## 2022-07-06 LAB — BASIC METABOLIC PANEL
Anion gap: 7 (ref 5–15)
BUN: 34 mg/dL — ABNORMAL HIGH (ref 8–23)
CO2: 26 mmol/L (ref 22–32)
Calcium: 7.9 mg/dL — ABNORMAL LOW (ref 8.9–10.3)
Chloride: 103 mmol/L (ref 98–111)
Creatinine, Ser: 0.91 mg/dL (ref 0.61–1.24)
GFR, Estimated: >60 mL/min (ref >60)
Glucose, Bld: 162 mg/dL — ABNORMAL HIGH (ref 70–99)
Potassium: 4.5 mmol/L (ref 3.5–5.1)
Sodium: 136 mmol/L (ref 135–145)

## 2022-07-06 LAB — GLUCOSE, CAPILLARY
Glucose-Capillary: 112 mg/dL — ABNORMAL HIGH (ref 70–99)
Glucose-Capillary: 132 mg/dL — ABNORMAL HIGH (ref 70–99)
Glucose-Capillary: 134 mg/dL — ABNORMAL HIGH (ref 70–99)
Glucose-Capillary: 137 mg/dL — ABNORMAL HIGH (ref 70–99)
Glucose-Capillary: 147 mg/dL — ABNORMAL HIGH (ref 70–99)
Glucose-Capillary: 157 mg/dL — ABNORMAL HIGH (ref 70–99)
Glucose-Capillary: 175 mg/dL — ABNORMAL HIGH (ref 70–99)

## 2022-07-06 LAB — CBC
HCT: 34.6 % — ABNORMAL LOW (ref 39.0–52.0)
Hemoglobin: 10.7 g/dL — ABNORMAL LOW (ref 13.0–17.0)
MCH: 29.8 pg (ref 26.0–34.0)
MCHC: 30.9 g/dL (ref 30.0–36.0)
MCV: 96.4 fL (ref 80.0–100.0)
Platelets: 109 10*3/uL — ABNORMAL LOW (ref 150–400)
RBC: 3.59 MIL/uL — ABNORMAL LOW (ref 4.22–5.81)
RDW: 19.8 % — ABNORMAL HIGH (ref 11.5–15.5)
WBC: 13.5 10*3/uL — ABNORMAL HIGH (ref 4.0–10.5)
nRBC: 0 % (ref 0.0–0.2)

## 2022-07-06 MED ORDER — SODIUM PHOSPHATES 45 MMOLE/15ML IV SOLN
30.0000 mmol | Freq: Once | INTRAVENOUS | Status: AC
  Administered 2022-07-06: 30 mmol via INTRAVENOUS
  Filled 2022-07-06: qty 10

## 2022-07-06 MED ORDER — PANTOPRAZOLE SODIUM 40 MG IV SOLR
40.0000 mg | Freq: Every day | INTRAVENOUS | Status: DC
  Administered 2022-07-06 – 2022-07-08 (×3): 40 mg via INTRAVENOUS
  Filled 2022-07-06 (×3): qty 10

## 2022-07-06 NOTE — Progress Notes (Signed)
Palliative Care Progress Note   Patient Name: Travis Wolfe       Date: 07/06/2022 DOB: June 16, 1935  Age: 88 y.o. MRN#: [id number] Attending Physician: Marshell Garfinkel, MD Primary Care Physician: Derrill Center., MD Admit Date: 06/25/2022  Chart reviewed.  As per nephrology notes and discussion with Dr. Vaughan Browner, CRRT continues. Awaiting wife/family's arrival and meeting to discuss next steps.  PMT will remain available to medical team and family. I am off service tomorrow but have asked a PMT provider to review the chart and be available for palliative needs.   Thank you for allowing the Palliative Medicine Team to assist in the care of Travis Wolfe.  [place] Ilsa Iha, FNP-BC Palliative Medicine Team Team Phone # [phone]  No charge

## 2022-07-06 NOTE — Progress Notes (Signed)
[facility] Progress Note   Assessment/ Plan:    AKI: likely multifactorial in the setting of sepsis/ CTA/ low Bps/ pressor requirements.  Vanc level supratherapuetic today at 32  Would be unusual for antibiotic spacer to provide enough systemic absorption to cause AKI             - off midodrine             - long discussion with Altha Harm (POA) and mulitple family members 1/17-1/18  - CRRT unlikely to restore pt to baseline level of health in setting of functional decline and now critical illness with MSOF- have decided to pursue 48-72 hr trial.  4K bath.  Family meeting today.  Anticipate continuing CRRT until the AM unless family decides differently  - palliative consulted- appreciate assistance   2.  Acute hypoxic RF             - intubated             - on meropenem/ vanc/ azithro   3.  R hip infection             - s/p spacer with ortho 06/29/22   4.  Anemia:             - transfuse prn  5.  Change in pupil size  - CT head 1/16 negative  6.  Dispo: palliative consulted, appreciate assistance    Subjective:    CRRT continues.  For family meeting today.  No real improvement in MS since starting.     Objective:   BP (!) 127/43   Pulse 64   Temp 98.2 F (36.8 C) (Axillary)   Resp (!) 22   Ht 5\' 8"  (1.727 m)   Wt 75.4 kg   SpO2 100%   BMI 25.27 kg/m   Intake/Output Summary (Last 24 hours) at 07/06/2022 1143 Last data filed at 07/06/2022 1100 Gross per 24 hour  Intake 1955.04 ml  Output 3925 ml  Net -1969.96 ml   Weight change:   Physical Exam: GEN ill-appearing, in bed HEENT ETT in place, PULM coarse mechanical bilaterally CV RRR ABD soft EXT 2+ anasarca NEURO intubated and sedated SKIN multiple weeping areas, improving ACCESS: nontunneled HD cath (left)  Imaging: No results found.  Labs: BMET Recent Labs  Lab 06/30/22 0515 06/30/22 1455 07/02/22 0407 07/03/22 0436 07/03/22 0747 07/03/22 1018 07/04/22 0411 07/04/22 1511  07/05/22 0408 07/05/22 1605 07/06/22 0457  NA 138   < > 135 138 135  --  138  137 138 134* 136 136  134*  K 5.4*   < > 4.8 6.0* 5.6* 5.6* 4.5  4.2 4.1 4.4 4.4 4.5  4.5  CL 101   < > 103 100 102  --  103  105 101 103 102 103  103  CO2 17*   < > 19* 21* 19*  --  23  23 25 24 24 26  26   GLUCOSE 196*   < > 158* 147* 319*  --  156*  152* 120* 137* 123* 162*  160*  BUN 59*   < > 96* 125* 124*  --  99*  95* 60* 39* 27* 34*  33*  CREATININE 2.29*   < > 3.55* 4.59* 4.38*  --  3.15*  2.91* 1.71* 1.23  1.22 0.93 0.91  1.11  CALCIUM 8.9   < > 8.4* 8.9 8.2*  --  8.2*  7.8* 8.1* 7.8* 8.0* 7.9*  7.6*  PHOS 8.1*  --  4.3  --   --   --  4.6 3.6 3.5 2.5 2.8   < > = values in this interval not displayed.   CBC Recent Labs  Lab 06/30/22 1455 06/30/22 2100 07/02/22 0407 07/03/22 0436 07/04/22 0411 07/06/22 0457  WBC 8.4   < > 14.1* 17.0* 15.0* 13.5*  NEUTROABS 7.0  --  12.6* 15.1* 13.8*  --   HGB 9.0*   < > 9.2* 10.0* 10.6* 10.7*  HCT 28.9*   < > 28.4* 31.4* 32.7* 34.6*  MCV 95.4   < > 90.2 92.4 92.4 96.4  PLT 153   < > 148* 189 178 109*   < > = values in this interval not displayed.    Medications:     Chlorhexidine Gluconate Cloth  6 each Topical Daily   cholecalciferol  2,000 Units Oral Daily   docusate  100 mg Per Tube BID   dorzolamide-timolol  1 drop Both Eyes BID   heparin  5,000 Units Subcutaneous Q8H   insulin aspart  0-9 Units Subcutaneous Q4H   labetalol  100 mg Per Tube BID   leptospermum manuka honey  1 Application Topical Daily   methylPREDNISolone (SOLU-MEDROL) injection  40 mg Intravenous Q24H   multivitamin with minerals  1 tablet Oral Daily   mouth rinse  15 mL Mouth Rinse Q2H   pantoprazole  40 mg Oral Q1200   polyethylene glycol  17 g Per Tube Daily   sodium bicarbonate  650 mg Per Tube TID   sodium chloride flush  10-40 mL Intracatheter Q12H   zinc sulfate  220 mg Oral Daily    Madelon Lips MD 07/06/2022, 11:43 AM

## 2022-07-06 NOTE — Progress Notes (Signed)
NAME:  Travis Wolfe, MRN:  [id number], DOB:  [birth year], LOS: 12 ADMISSION DATE:  06/25/2022, CONSULTATION DATE:  06/28/22 REFERRING MD:  Flora Lipps, MD CHIEF COMPLAINT:  respiratory failure   History of Present Illness:  Travis Wolfe is an 88 year old male with history of atrial fibrillation on eliquis, hypertension and complex surgical history of the right hip with recent arthroplasty resection with placement of antibiotic spacer and started on daptomycin and ceftriaxone 06/07/22, who was admitted 06/25/22 from SNF with weakness and drop in hemoglobin. He was started on antibiotics for multifocal pneumonia based on chest radiograph on admission.  06/28/21 code stroke was called due to altered mentation and inability to move his upper extremities along with facial asymmetry. CTA head was negative for stroke.   He was more alert and responsive at time of evaluation in the ICU. His wife was at the bedside.   Orthopedics was planning return to OR on 1/13 but has post-poned that procedure at this time.   Pertinent  Medical History   Atrial Fibrillation Hypertension Mitral regurgitation  Significant Hospital Events: Including procedures, antibiotic start and stop dates in addition to other pertinent events   [facility] and anemia. Concern for pneumonia.  1/12 transferred to ICU for code stroke and increasing O2 needs 1/13 remained intubated after ortho hip proceudre, bronchoscopy and PICC line place. 1/14 a-line place, weaned off vasopressors  1/15 remains intubated, poor mental status, family meeting held 1/17 started CRRT, weaned on PS 5/5  Interim History / Subjective:   Remains on CRRT.  Mental status is still unresponsive off all sedation  Objective   Blood pressure (!) 127/43, pulse 72, temperature 98.2 F (36.8 C), temperature source Axillary, resp. rate 17, height 5\' 8"  (1.727 m), weight 75.4 kg, SpO2 100 %.    Vent Mode: PRVC FiO2 (%):  [40 %]  40 % Set Rate:  [18 bmp] 18 bmp Vt Set:  [480 mL] 480 mL PEEP:  [5 cmH20] 5 cmH20 Pressure Support:  [5 cmH20] 5 cmH20 Plateau Pressure:  [18 cmH20-22 cmH20] 18 cmH20   Intake/Output Summary (Last 24 hours) at 07/06/2022 0859 Last data filed at 07/06/2022 0800 Gross per 24 hour  Intake 1971.23 ml  Output 4488 ml  Net -2516.77 ml   Filed Weights   07/02/22 0215 07/03/22 0448 07/04/22 0500  Weight: 69.9 kg 77.7 kg 75.4 kg    Examination: Gen:      No acute distress, frail, elderly, malnourished HEENT:  EOMI, sclera anicteric Neck:     No masses; no thyromegaly, ETT Lungs:    Clear to auscultation bilaterally; normal respiratory effort CV:         Regular rate and rhythm; no murmurs Abd:      + bowel sounds; soft, non-tender; no palpable masses, no distension Ext:    No edema; adequate peripheral perfusion Skin:      Warm and dry; no rash Neuro: Unresponsive  Labs/imaging reviewed Sodium 134, BUN/creatinine 33/1.11 WBC 13.5, hemoglobin 10.7, platelets 109 No new imaging  Echo 06/26/22 LV EF 65% RV systolic function and size are normal, but noted to have elevated pulmonary pressures of 47mmHg.   Abdominal US 06/30/22 Cholelithiasis and biliary sludge without definitive sonographic evidence of acute cholecystitis. Small volume free fluid.  Resolved Hospital Problem list   Anion Gap Metabolic Acidosis Lactic Acidosis Hyperkalemia Shock  Assessment & Plan:  Acute Hypoxemic Respiratory Failure Multifocal Pneumonia -RVP , resp cx and urine legionella  neg, 1/17 concern for aspiration ?Daptomycin induced eosinophilic pneumonia Daptomycin stopped on 1/12 Continue Solu-Medrol at current dose of 40 mg Broad antibiotic coverage with meropenem and vancomycin Fungitell ordered.  Septic shock due to Pneumonia vs other source Follow cultures Antibiotics as above Currently off pressors  Acute Kidney Injury, Anuric Continuing CRRT for 72 hours  Acute Encephalopathy Change in  Pupil sizes 1/16 - 1/16CT head neg - MRI brain 1/13 negative for stroke or hemorrhage - cefepime stopped 1/12 - stopped baclofen 1/13 - reduced steroid dose 1/15  - Monitor off sedation  NSTEMI Pulmonary Hypertension Paroxysmal Atrial Fibrillation Hx of hypertension - elevated troponin and inverted T waves in lateral leads - started heparin/asa 1/13, stoppded 1/14 due to drop in hemoglobin and increase in LA Continue labetalol, as needed hydralazine  Anemia ?GI bleeding - continue PPI daily - no further reports of bloody BMs -  H/H stable  S/p Right hip arthroplasty resection with antibiotic spacer Right Hip Seroma - OR 1/13 for removal of antibiotic spacer for comfort  Anasarca Moderate Protein Calorie Malnutrition Tube feeds  Pressure injury right and left buttocks stage 1, present on admission  Pressure Injury 07/01/22 Heel Left Deep Tissue Pressure Injury   Best Practice (right click and "Reselect all SmartList Selections" daily)   Diet/type: tubefeeds DVT prophylaxis: prophylactic heparin  GI prophylaxis: PPI Lines: Central line and Arterial Line Foley:  Yes, and it is still needed Code Status:  DNR Last date of multidisciplinary goals of care discussion [Discussed with patient's wife at bedside 1/14] 1/17 ongoing discussion with wife about dialysis, he had indicated that he did not want dialysis in the past, she agreed for temporary trial of CRRT for 3 days  Family updated again at bedside 1/19. 1/20.  Awaiting arrival of wife for family meeting  Critical care time:     The patient is critically ill with multiple organ system failure and requires high complexity decision making for assessment and support, frequent evaluation and titration of therapies, advanced monitoring, review of radiographic studies and interpretation of complex data.   Critical Care Time devoted to patient care services, exclusive of separately billable procedures, described in this note is  35 minutes.   Marshell Garfinkel MD [facility] See Amion for pager  If no response to pager , please call [phone] until 7pm After 7:00 pm call Elink  [phone] 07/06/2022, 8:59 AM

## 2022-07-06 NOTE — Progress Notes (Addendum)
Increased PVCs noted on monitor. Phosphorous level 1.7 on afternoon draw. Mannam MD aware. Orders received for K-phos infusion and magnesium add on

## 2022-07-06 NOTE — Progress Notes (Signed)
Filter pressures, pressure drop and TMP steadily increasing over shift. Coladonato MD paged and discussed. Per MD, if filter clots ok to leave off overnight

## 2022-07-06 NOTE — Progress Notes (Signed)
PT currently has Exuderm Satin on upper lip (under ETT holder cushion). Satin was in place when RT received this PT. Area is covered well at this time. Was informed by caregiver that upper lip had a sore and was covered.PT does grimace when ETT is moved from left to right.

## 2022-07-06 NOTE — Progress Notes (Signed)
   07/06/22 0600  Charting Type  Charting Type Full Reassessment Changes Noted  Focused Reassessment No Changes Respiratory;Cardiac;Vascular  Focused Reassessment Changes Noted Neurological  Neurological  Level of Consciousness Responds to Voice  Neuro (WDL) X  Orientation Level Intubated/Tracheostomy - Unable to assess  Cognition Follows commands (squeezed right hand, wiggled toes and opened eyes and mouth on command)  Speech Intubated/Tracheostomy - Unable to assess  R Pupil Size (mm) 3  R Pupil Shape Round  R Pupil Reaction Brisk  L Pupil Size (mm) 3  L Pupil Shape Round  L Pupil Reaction Brisk  R Hand Grip Weak  L Hand Grip Absent  R Foot Dorsiflexion Weak  L Foot Dorsiflexion Weak  R Foot Plantar Flexion Weak  L Foot Plantar Flexion Weak  RUE Motor Response Purposeful movement  LUE Motor Response Non-purposeful movement  RLE Motor Response Purposeful movement  LLE Motor Response Purposeful movement    Pt noted to squeeze right hand, wiggle both toes, open eyes and open/close mouth on command.  Not squeezing left hand at this time.

## 2022-07-06 NOTE — Progress Notes (Signed)
PT arterial line has good waveform on monitor at this time. Line draws and flushes well, insertion site is WNL and dressed at this time.

## 2022-07-07 DIAGNOSIS — R601 Generalized edema: Secondary | ICD-10-CM | POA: Diagnosis not present

## 2022-07-07 DIAGNOSIS — D649 Anemia, unspecified: Secondary | ICD-10-CM | POA: Diagnosis not present

## 2022-07-07 DIAGNOSIS — R531 Weakness: Secondary | ICD-10-CM

## 2022-07-07 DIAGNOSIS — N179 Acute kidney failure, unspecified: Secondary | ICD-10-CM | POA: Diagnosis not present

## 2022-07-07 DIAGNOSIS — L8990 Pressure ulcer of unspecified site, unspecified stage: Secondary | ICD-10-CM

## 2022-07-07 DIAGNOSIS — Z515 Encounter for palliative care: Secondary | ICD-10-CM

## 2022-07-07 DIAGNOSIS — Z7189 Other specified counseling: Secondary | ICD-10-CM

## 2022-07-07 DIAGNOSIS — R4589 Other symptoms and signs involving emotional state: Secondary | ICD-10-CM

## 2022-07-07 DIAGNOSIS — Z66 Do not resuscitate: Secondary | ICD-10-CM

## 2022-07-07 LAB — CBC
HCT: 37.8 % — ABNORMAL LOW (ref 39.0–52.0)
Hemoglobin: 11.7 g/dL — ABNORMAL LOW (ref 13.0–17.0)
MCH: 29.7 pg (ref 26.0–34.0)
MCHC: 31 g/dL (ref 30.0–36.0)
MCV: 95.9 fL (ref 80.0–100.0)
Platelets: 110 10*3/uL — ABNORMAL LOW (ref 150–400)
RBC: 3.94 MIL/uL — ABNORMAL LOW (ref 4.22–5.81)
RDW: 19.9 % — ABNORMAL HIGH (ref 11.5–15.5)
WBC: 19.3 10*3/uL — ABNORMAL HIGH (ref 4.0–10.5)
nRBC: 0 % (ref 0.0–0.2)

## 2022-07-07 LAB — RENAL FUNCTION PANEL
Albumin: 2.6 g/dL — ABNORMAL LOW (ref 3.5–5.0)
Anion gap: 6 (ref 5–15)
BUN: 23 mg/dL (ref 8–23)
CO2: 29 mmol/L (ref 22–32)
Calcium: 7.9 mg/dL — ABNORMAL LOW (ref 8.9–10.3)
Chloride: 103 mmol/L (ref 98–111)
Creatinine, Ser: 0.8 mg/dL (ref 0.61–1.24)
GFR, Estimated: >60 mL/min (ref >60)
Glucose, Bld: 146 mg/dL — ABNORMAL HIGH (ref 70–99)
Phosphorus: 3.5 mg/dL (ref 2.5–4.6)
Potassium: 4.5 mmol/L (ref 3.5–5.1)
Sodium: 138 mmol/L (ref 135–145)

## 2022-07-07 LAB — GLUCOSE, CAPILLARY
Glucose-Capillary: 117 mg/dL — ABNORMAL HIGH (ref 70–99)
Glucose-Capillary: 131 mg/dL — ABNORMAL HIGH (ref 70–99)
Glucose-Capillary: 132 mg/dL — ABNORMAL HIGH (ref 70–99)
Glucose-Capillary: 143 mg/dL — ABNORMAL HIGH (ref 70–99)
Glucose-Capillary: 149 mg/dL — ABNORMAL HIGH (ref 70–99)
Glucose-Capillary: 152 mg/dL — ABNORMAL HIGH (ref 70–99)

## 2022-07-07 LAB — MAGNESIUM: Magnesium: 2.6 mg/dL — ABNORMAL HIGH (ref 1.7–2.4)

## 2022-07-07 LAB — BASIC METABOLIC PANEL
Anion gap: 6 (ref 5–15)
BUN: 23 mg/dL (ref 8–23)
CO2: 28 mmol/L (ref 22–32)
Calcium: 7.8 mg/dL — ABNORMAL LOW (ref 8.9–10.3)
Chloride: 103 mmol/L (ref 98–111)
Creatinine, Ser: 0.77 mg/dL (ref 0.61–1.24)
GFR, Estimated: >60 mL/min (ref >60)
Glucose, Bld: 146 mg/dL — ABNORMAL HIGH (ref 70–99)
Potassium: 4.4 mmol/L (ref 3.5–5.1)
Sodium: 137 mmol/L (ref 135–145)

## 2022-07-07 MED ORDER — CHLORHEXIDINE GLUCONATE CLOTH 2 % EX PADS
6.0000 | MEDICATED_PAD | Freq: Every day | CUTANEOUS | Status: DC
  Administered 2022-07-08: 6 via TOPICAL

## 2022-07-07 MED ORDER — SODIUM CHLORIDE 0.9 % IV SOLN
1.0000 g | INTRAVENOUS | Status: DC
  Administered 2022-07-08: 1 g via INTRAVENOUS
  Filled 2022-07-07: qty 20

## 2022-07-07 MED ORDER — LINEZOLID 600 MG/300ML IV SOLN
600.0000 mg | Freq: Two times a day (BID) | INTRAVENOUS | Status: DC
  Administered 2022-07-08: 600 mg via INTRAVENOUS
  Filled 2022-07-07: qty 300

## 2022-07-07 MED ORDER — MORPHINE SULFATE (PF) 2 MG/ML IV SOLN
2.0000 mg | INTRAVENOUS | Status: DC | PRN
  Administered 2022-07-07 – 2022-07-08 (×3): 2 mg via INTRAVENOUS
  Filled 2022-07-07 (×3): qty 1

## 2022-07-07 MED ORDER — ORAL CARE MOUTH RINSE
15.0000 mL | OROMUCOSAL | Status: DC
  Administered 2022-07-07 – 2022-07-10 (×10): 15 mL via OROMUCOSAL

## 2022-07-07 MED ORDER — ORAL CARE MOUTH RINSE: 15.0000 mL | OROMUCOSAL | Status: DC | PRN

## 2022-07-07 NOTE — Progress Notes (Signed)
CH was paged by medical team to provide emotional and spiritual support for patient and family. Patient was able to communicate needs with family and staff upon arrival. Appeared lethargic and slightly confused. Spiritual care was provided through life review, normalizing feelings and the ministry of presence. Reciting Psalms 23 and prayer upon request. Spiritual care visit was appreciated.   Marilynn Rail, M.Div. Healthcare Chaplain

## 2022-07-07 NOTE — Progress Notes (Signed)
Pharmacy Antibiotic Note  Travis Wolfe is a 88 y.o. 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  During this admission, he underwent excisional TKA with abx spacers on 06/06/22 and discharged on daptomycin and ceftriaxone with plan to treat through 07/19/22.  He presented back to the ED on 06/25/22 from SNF after he was found to have low hgb.  CXR on 06/25/22 showed findings with concern for multifocal PNA. He was started on vancomycin/cefepime on admission, changed from vancomycin to daptomycin on 06/27/22 and on 06/28/22  abx changed to vancomycin and zosyn d/t concern for eosinophilic pneumonia due to daptomycin. With worsening renal function and risk for nephrotoxicity with vanc/zosyn, zosyn changed to meropenem.   Patient remains on vancomycin + meropenem at this time (pharmacy to dose).  Significant Events: - 1/13: Went to OR for R total hip revision and removal of abx spacer. Bronchoscopy 1/17>> 1/21 CRRT trial, now off, no UOP   Today, 07/07/2022: S/p 72 hr trial of CRRT today> no UOP WBC up to 19.3  Plan: DC vancomycin. Start zyvox 600 mg IV q12 on 1/22 (got vancomycin today)   Change meropenem to 1 gm IV q24 (no UOP) _________________________________________ Height: 5\' 8"  (172.7 cm) Weight: 62.2 kg (137 lb 2 oz) IBW/kg (Calculated) : 68.4  Temp (24hrs), Avg:98.2 F (36.8 C), Min:98 F (36.7 C), Max:98.2 F (36.8 C)  Recent Labs  Lab 06/30/22 1455 06/30/22 1735 06/30/22 2100 07/01/22 0800 07/02/22 0407 07/03/22 0436 07/03/22 0747 07/03/22 1221 07/04/22 0411 07/04/22 1511 07/05/22 0408 07/05/22 1605 07/06/22 0457 07/06/22 1650 07/07/22 0515  WBC 8.4  --    < > 11.9* 14.1* 17.0*  --   --  15.0*  --   --   --  13.5*  --  19.3*  CREATININE 2.49*  --    < >  --  3.55* 4.59*   < >  --  3.15*  2.91*   < > 1.23  1.22 0.93 0.91  1.11 0.83 0.80  0.77  LATICACIDVEN 5.2* 3.7*  --  1.6  --   --   --   --   --   --   --   --    --   --   --   VANCORANDOM  --   --    < >  --   --   --   --  27 18  --   --   --   --   --   --    < > = values in this interval not displayed.     Estimated Creatinine Clearance: 59.4 mL/min (by C-G formula based on SCr of 0.8 mg/dL).    Allergies  Allergen Reactions   Tape Dermatitis and Rash    ONLY use Paper tape   Other     Cat dander   Doxycycline Rash   Horse Epithelium Rash    Eudelia Bunch, Pharm.D Use secure chat for questions 07/07/2022 12:32 PM

## 2022-07-07 NOTE — Progress Notes (Signed)
Daily Progress Note   Patient Name: Travis Wolfe       Date: 07/07/2022 DOB: 13-May-1935  Age: 88 y.o. MRN#: [id number] Attending Physician: Marshell Garfinkel, MD Primary Care Physician: Derrill Center., MD Admit Date: 06/25/2022 Length of Stay: 12 days  Reason for Consultation/Follow-up: Establishing goals of care  Subjective:   CC: Patient intubated and nodding/squeezing hand at times. Discussed care with patient's wife and son at bedside. Following up regarding complex medical decision making.   Subjective:  Discussed care with bedside RN and PCCM, Dr. Vaughan Browner, extensively prior to seeing patient.  Presented to bedside.  Patient lying intubated in bed.  Patient's wife at bedside.  Patient's son joined in conversation as well.  Introduced myself as a member of the palliative medicine team.  Wife had been discussing care with other palliative providers.  Took time to explore all he has been through up until through this point.  Discussed that patient had his time-limited trial of CRRT and if his mentation is going to be the best it can be for extubation, now is the time. Also spent time discussing patient is in multisystem organ failure between his lungs, skin, and kidneys.  His body is shutting down despite all the aggressive medical interventions he has gotten.  Patient and family had also previously expressed patient would not want intubation or long-term dialysis.  We discussed how we could best support patient's own wishes for care moving forward. Tearful during conversation.  Wife feels a lot of grief and regret for all that she has put the patient through knowing he would not want these things.  Spent time providing emotional support via active listening.  Acknowledges that the wife has done all she can for the one she loves.  At this time, can instead allow patient to have his dignity by allowing for extubation as he would want himself.  Discussed that once patient is extubated, will not  reintubate as per patient's own wishes.  If patient does well after extubation, we will continue discussions and to support him appropriately.  If patient does not do well off of extubation, will allow him dignity and comfort at the end of life. Also discussed the idea of CRRT.  Patient has had a time-limited trial to hopefully improve mentation.  Patient now not making urine.  This is a sign his body is shutting down.  Patient did not want long-term dialysis and so should respect his wishes and will not restart CRRT as that is not a bridge to good quality of life which was important to the patient.  At best patient would end up on dialysis which he likely cannot tolerate due to his incredibly weakened state and skin failure in addition to that not being something he would want.   During conversation, patient nodding at times and squeezing fingers.  Patient even nodding to wife he wants the tube for the ventilator removed.   After extensive discussions and emotional support, wife (and son) agreeing with proceeding with extubation at this time.  Noted we will continue to monitor patient's status after extubation to determine next steps in care though again would not restart CRRT as that is against the patient's own wishes not a bridge to good quality of life.   All questions answered at that time.  Noted palliative medicine team would continue to follow along.  Objective:   Vital Signs:  BP (!) 182/63   Pulse 77   Temp 98 F (36.7 C) (Axillary)  Resp (!) 27   Ht 5\' 8"  (1.727 m)   Wt 62.2 kg   SpO2 98%   BMI 20.85 kg/m   Physical Exam: General: frail, laying intubated in bed, nodding/squeezing hand at times, chronically ill appearing, appears uncomfortable Eyes: no drainage noted HENT: ET tube in place Cardiovascular: RRR, dependent edema in LE/ bl Respiratory: intubated on ventilator support Abdomen: not distended Extremities: muscle wasting present in all extremities  Skin: Multiple  ecchymoses on upper extremities bilaterally, multiple bandages in place for skin tears, multiple deep tissue injuries and sacral wound present (reviewed EMR/discussed with RN) Neuro: nodding/squeezing hand at times though not consistently  Imaging:  I personally reviewed recent imaging.   Assessment & Plan:   Assessment: 88 y.o. male  with past medical history of atrial fibrillation on eliquis, hypertension, mitral regurgitation, and complex surgical history of the right hip with recent arthroplasty resection with placement of antibiotic spacer and started on daptomycin and ceftriaxone 06/07/22 admitted on 06/25/2022 with weakness and anemia. Treated for pneumonia. 1/12 required transfer to ICU for code stroke - imaging negative.  1/13 had hip procedure and remained intubated afterward. Patient with AKI and meets criteria for CRRT. PMT consulted to discuss [place].    Recommendations/Plan: # Complex medical decision making/goals of care:  - Patient unable to participate in complex medical decision-making due to his current medical status.  -Extensive conversation with wife at bedside as well as patient's son.  Discussed patient is in multisystem organ failure despite all appropriate aggressive medical interventions, his body is shutting down.  Had expressed to family not wanting to be maintained on machines such as dialysis and a ventilator.  Discussed respecting patient's wishes for care at this time.  Will perform extubation today to determine how patient will proceed moving forward.  If patient does well off of ventilator, can discuss next steps.  If patient deteriorates off of ventilator, will not reintubate and will transition to focusing on his comfort for the time he does have.   -Provided emotional support for wife as she is dealing with lots of grieving and guilt about all her husband has been through.  Appropriately tearful during discussion and acknowledging her love for the patient and not  wanting to see him die.  -  Code Status: DNR  # Symptom management:  -Discussed care with PCCM provider Dr. Vaughan Browner. Patient is not full comfort care though discussed with wife that should he deteriorating off of the ventilator, will not reintubate and will need to focus on comfort. Medications has been added appropriately for this including IV morphine.  # Psychosocial Support:  -wife, son  -asked chaplain to come to bedside to support wife at her request   # Discharge Planning: TBD  Discussed with: wife, son, PCCM, bedside RN  Thank you for allowing the palliative care team to participate in the care [name].  Chelsea Aus, DO Palliative Care Provider PMT # [phone]  If patient remains symptomatic despite maximum doses, please call PMT at [phone] between 0700 and 1900. Outside of these hours, please call attending, as PMT does not have night coverage.

## 2022-07-07 NOTE — Progress Notes (Addendum)
   07/07/22 0400  Charting Type  Charting Type Full Reassessment Changes Noted  Focused Reassessment No Changes Respiratory;Cardiac;Vascular  Focused Reassessment Changes Noted Neurological  Neurological  Neuro (WDL) X (pt nodded and shook head to questions, moved eyes in attempt to open eyes; squeezed both hands lightly, and wiggled both toes.)  Orientation Level Intubated/Tracheostomy - Unable to assess  Cognition Follows commands;Poor attention/concentration  Speech Intubated/Tracheostomy - Unable to assess  R Pupil Size (mm) 3  R Pupil Shape Round  R Pupil Reaction Brisk  L Pupil Size (mm) 3  L Pupil Shape Round  L Pupil Reaction Brisk  R Hand Grip Weak  L Hand Grip Weak   R Foot Dorsiflexion Weak  L Foot Dorsiflexion Weak  R Foot Plantar Flexion Weak  L Foot Plantar Flexion Weak  RUE Motor Response Purposeful movement  LUE Motor Response Purposeful movement  RLE Motor Response Purposeful movement  LLE Motor Response Purposeful movement  [place] Agitation Sedation Scale  [place] Agitation Sedation Scale (RASS) -2  Small Bore Feeding Tube Right nare Marking at nare/corner of mouth 58 cm  Placement Date/Time: 06/28/22 1700   Person Inserting LDA: Justice Britain, RN  Feeding Tube Type: Small bore  Feeding Tube Location: Right nare  Secured by: Tape  Tube Position (Required): Marking at nare/corner of mouth  Measurement (cm) (Required): 58 cm  Intake (mL) 30 mL   Pt -3 to -4 rass all shift and not following commands as he had done on dayshift.  At 0400 assessment, pt back to squeezing hands, wiggling toes, and even shaking his head no.  Pt did require 25 mcg fentanyl for Rass score 1-2- pt was fighting the vent, biting on tube; given with + effect.

## 2022-07-07 NOTE — Procedures (Signed)
Extubation Procedure Note  Patient Details:   Name: Travis Wolfe DOB: 07-27-1934 MRN: [id number]   Airway Documentation:    Vent end date: 07/07/22 Vent end time: 1128   Evaluation  O2 sats: stable throughout Complications: No apparent complications Patient did tolerate procedure well. Bilateral Breath Sounds: Diminished   Yes  Baldwin [place] Nannette 07/07/2022, 11:32 AM   Positive cuff leak pre extubation. Placed on 3 lpm nasal cannula post extubation.

## 2022-07-07 NOTE — Progress Notes (Signed)
NAME:  Travis Wolfe, MRN:  [id number], DOB:  18-Jan-1935, LOS: 12 ADMISSION DATE:  06/25/2022, CONSULTATION DATE:  06/28/22 REFERRING MD:  Flora Lipps, MD CHIEF COMPLAINT:  respiratory failure   History of Present Illness:  Travis Wolfe is an 88 year old male with history of atrial fibrillation on eliquis, hypertension and complex surgical history of the right hip with recent arthroplasty resection with placement of antibiotic spacer and started on daptomycin and ceftriaxone 06/07/22, who was admitted 06/25/22 from SNF with weakness and drop in hemoglobin. He was started on antibiotics for multifocal pneumonia based on chest radiograph on admission.  06/28/21 code stroke was called due to altered mentation and inability to move his upper extremities along with facial asymmetry. CTA head was negative for stroke.   He was more alert and responsive at time of evaluation in the ICU. His wife was at the bedside.   Orthopedics was planning return to OR on 1/13 but has post-poned that procedure at this time.   Pertinent  Medical History   Atrial Fibrillation Hypertension Mitral regurgitation  Significant Hospital Events: Including procedures, antibiotic start and stop dates in addition to other pertinent events   1/9 admitted to [facility] for weakness and anemia. Concern for pneumonia.  1/12 transferred to ICU for code stroke and increasing O2 needs 1/13 remained intubated after ortho hip proceudre, bronchoscopy and PICC line place. 1/14 a-line place, weaned off vasopressors  1/15 remains intubated, poor mental status, family meeting held 1/17 started CRRT, weaned on PS 5/5  Interim History / Subjective:   CRRT stopped today morning.  Has some responses but  Objective   Blood pressure (!) 182/63, pulse 84, temperature 98 F (36.7 C), temperature source Axillary, resp. rate (!) 22, height 5\' 8"  (1.727 m), weight 62.2 kg, SpO2 98 %.    Vent Mode: PRVC FiO2 (%):  [30 %-35 %] 30 % Set  Rate:  [18 bmp] 18 bmp Vt Set:  [480 mL] 480 mL PEEP:  [5 cmH20] 5 cmH20 Pressure Support:  [5 cmH20-10 cmH20] 10 cmH20 Plateau Pressure:  [17 cmH20-20 cmH20] 20 cmH20   Intake/Output Summary (Last 24 hours) at 07/07/2022 0812 Last data filed at 07/07/2022 0700 Gross per 24 hour  Intake 2192.08 ml  Output 4498.1 ml  Net -2306.02 ml   Filed Weights   07/03/22 0448 07/04/22 0500 07/07/22 0352  Weight: 77.7 kg 75.4 kg 62.2 kg    Examination: Gen:      No acute distress, critically ill-appearing, frail, elderly HEENT:  EOMI, sclera anicteric Neck:     No masses; no thyromegaly Lungs:    Clear to auscultation bilaterally; normal respiratory effort CV:         Regular rate and rhythm; no murmurs Abd:      + bowel sounds; soft, non-tender; no palpable masses, no distension Ext:    No edema; adequate peripheral perfusion Skin:      Warm and dry; no rash Neuro: Unresponsive  Labs/imaging reviewed WBC count increased to 19.3, platelets 110, hemoglobin 11.7  Echo 06/26/22 LV EF 50% RV systolic function and size are normal, but noted to have elevated pulmonary pressures of 48mmHg.   Abdominal US 06/30/22 Cholelithiasis and biliary sludge without definitive sonographic evidence of acute cholecystitis. Small volume free fluid.  Resolved Hospital Problem list   Anion Gap Metabolic Acidosis Lactic Acidosis Hyperkalemia Shock  Assessment & Plan:  Acute Hypoxemic Respiratory Failure Multifocal Pneumonia -RVP , resp cx and urine legionella neg, 1/17 concern for  aspiration ?Daptomycin induced eosinophilic pneumonia Daptomycin stopped on 1/12 Continue Solu-Medrol at current dose of 40 mg Broad antibiotic coverage with meropenem and vancomycin Fungitell ordered.  Septic shock due to Pneumonia vs other source Follow cultures Antibiotics as above Currently off pressors  Acute Kidney Injury, Anuric Finished ambulatory trials of CRRT 72 hours. Still not making urine  Acute  Encephalopathy Change in Pupil sizes 1/16 - 1/16CT head neg - MRI brain 1/13 negative for stroke or hemorrhage - cefepime stopped 1/12 - stopped baclofen 1/13 - reduced steroid dose 1/15  - Monitor off sedation  NSTEMI Pulmonary Hypertension Paroxysmal Atrial Fibrillation Hx of hypertension - elevated troponin and inverted T waves in lateral leads - started heparin/asa 1/13, stoppded 1/14 due to drop in hemoglobin and increase in LA Continue labetalol, as needed hydralazine  Anemia ?GI bleeding - continue PPI daily - no further reports of bloody BMs -  H/H stable  S/p Right hip arthroplasty resection with antibiotic spacer Right Hip Seroma - OR 1/13 for removal of antibiotic spacer for comfort  Anasarca Moderate Protein Calorie Malnutrition Tube feeds  Pressure injury right and left buttocks stage 1, present on admission  Pressure Injury 07/01/22 Heel Left Deep Tissue Pressure Injury   Best Practice (right click and "Reselect all SmartList Selections" daily)   Diet/type: tubefeeds DVT prophylaxis: prophylactic heparin  GI prophylaxis: PPI Lines: Central line and Arterial Line Foley:  Yes, and it is still needed Code Status:  DNR Last date of multidisciplinary goals of care discussion [Discussed with patient's wife at bedside 1/14] 1/17 ongoing discussion with wife about dialysis, he had indicated that he did not want dialysis in the past, she agreed for temporary trial of CRRT for 3 days  Discussed with wife on 1/21.  She is conflicted about whether we should resume dialysis.  Will ask palliative care to reengage  Critical care time:     The patient is critically ill with multiple organ system failure and requires high complexity decision making for assessment and support, frequent evaluation and titration of therapies, advanced monitoring, review of radiographic studies and interpretation of complex data.   Critical Care Time devoted to patient care services,  exclusive of separately billable procedures, described in this note is 35 minutes.   Marshell Garfinkel MD [facility] See Amion for pager  If no response to pager , please call [phone] until 7pm After 7:00 pm call Elink  [phone] 07/07/2022, 8:12 AM

## 2022-07-07 NOTE — Progress Notes (Signed)
Arterial line has good waveform, flushes and draws well. Catheter site is WNL at this time.

## 2022-07-07 NOTE — Progress Notes (Signed)
CPT not completed for 1200 round. PT just extubated to 3 LPM [place]. Pre extubation suction completed (minimal, clear, white, thin, frothy mucus). Family at bedside encouraging and reassuring PT at this time. RN aware.

## 2022-07-07 NOTE — Progress Notes (Signed)
[facility] Progress Note   Assessment/ Plan:    AKI: likely multifactorial in the setting of sepsis/ CTA/ low Bps/ pressor requirements.  Vanc level supratherapuetic today at 60  Would be unusual for antibiotic spacer to provide enough systemic absorption to cause AKI             - off midodrine             - long discussion with Altha Harm (POA) and mulitple family members 1/17-1/18  - CRRT unlikely to restore pt to baseline level of health in setting of functional decline and now critical illness with MSOF- off this AM.  Will not restart  - palliative consulted- appreciate assistance.    - will follow    2.  Acute hypoxic RF             - Extubted 07/07/22.             - on meropenem/ vanc/ azithro   3.  R hip infection             - s/p surg with ortho 06/29/22   4.  Anemia:             - transfuse prn  5.  Change in pupil size  - CT head 1/16 negative  6.  Dispo: palliative consulted, appreciate assistance    Subjective:    Extubated this AM.  CRRT off.  Will not restart.     Objective:   BP (!) 154/46 Comment: artline  Pulse 67   Temp 97.9 F (36.6 C) (Axillary)   Resp 20   Ht 5\' 8"  (1.727 m)   Wt 62.2 kg   SpO2 99%   BMI 20.85 kg/m   Intake/Output Summary (Last 24 hours) at 07/07/2022 1334 Last data filed at 07/07/2022 1300 Gross per 24 hour  Intake 2175.36 ml  Output 3769.1 ml  Net -1593.74 ml   Weight change:   Physical Exam: GEN ill-appearing, in bed HEENT ETT in place, PULM coarse mechanical bilaterally CV RRR ABD soft EXT 2+ anasarca NEURO intubated and sedated SKIN multiple weeping areas, improving ACCESS: nontunneled HD cath (left)  Imaging: No results found.  Labs: BMET Recent Labs  Lab 07/04/22 0411 07/04/22 1511 07/05/22 0408 07/05/22 1605 07/06/22 0457 07/06/22 1650 07/07/22 0515  NA 138  137 138 134* 136 136  134* 135 138  137  K 4.5  4.2 4.1 4.4 4.4 4.5  4.5 4.5 4.5  4.4  CL 103  105 101 103 102 103   103 103 103  103  CO2 23  23 25 24 24 26  26 27 29  28   GLUCOSE 156*  152* 120* 137* 123* 162*  160* 136* 146*  146*  BUN 99*  95* 60* 39* 27* 34*  33* 23 23  23   CREATININE 3.15*  2.91* 1.71* 1.23  1.22 0.93 0.91  1.11 0.83 0.80  0.77  CALCIUM 8.2*  7.8* 8.1* 7.8* 8.0* 7.9*  7.6* 7.9* 7.9*  7.8*  PHOS 4.6 3.6 3.5 2.5 2.8 1.7* 3.5   CBC Recent Labs  Lab 06/30/22 1455 06/30/22 2100 07/02/22 0407 07/03/22 0436 07/04/22 0411 07/06/22 0457 07/07/22 0515  WBC 8.4   < > 14.1* 17.0* 15.0* 13.5* 19.3*  NEUTROABS 7.0  --  12.6* 15.1* 13.8*  --   --   HGB 9.0*   < > 9.2* 10.0* 10.6* 10.7* 11.7*  HCT 28.9*   < > 28.4* 31.4* 32.7* 34.6*  37.8*  MCV 95.4   < > 90.2 92.4 92.4 96.4 95.9  PLT 153   < > 148* 189 178 109* 110*   < > = values in this interval not displayed.    Medications:     Chlorhexidine Gluconate Cloth  6 each Topical Daily   cholecalciferol  2,000 Units Oral Daily   docusate  100 mg Per Tube BID   dorzolamide-timolol  1 drop Both Eyes BID   heparin  5,000 Units Subcutaneous Q8H   insulin aspart  0-9 Units Subcutaneous Q4H   labetalol  100 mg Per Tube BID   methylPREDNISolone (SOLU-MEDROL) injection  40 mg Intravenous Q24H   multivitamin with minerals  1 tablet Oral Daily   mouth rinse  15 mL Mouth Rinse Q2H   pantoprazole (PROTONIX) IV  40 mg Intravenous Daily   polyethylene glycol  17 g Per Tube Daily   sodium bicarbonate  650 mg Per Tube TID   sodium chloride flush  10-40 mL Intracatheter Q12H   zinc sulfate  220 mg Oral Daily    Madelon Lips MD 07/07/2022, 1:34 PM

## 2022-07-08 DIAGNOSIS — R451 Restlessness and agitation: Secondary | ICD-10-CM

## 2022-07-08 DIAGNOSIS — Z79899 Other long term (current) drug therapy: Secondary | ICD-10-CM

## 2022-07-08 DIAGNOSIS — R52 Pain, unspecified: Secondary | ICD-10-CM

## 2022-07-08 DIAGNOSIS — R601 Generalized edema: Secondary | ICD-10-CM | POA: Diagnosis not present

## 2022-07-08 DIAGNOSIS — Z515 Encounter for palliative care: Secondary | ICD-10-CM

## 2022-07-08 DIAGNOSIS — R0602 Shortness of breath: Secondary | ICD-10-CM

## 2022-07-08 DIAGNOSIS — Z7189 Other specified counseling: Secondary | ICD-10-CM | POA: Diagnosis not present

## 2022-07-08 DIAGNOSIS — D649 Anemia, unspecified: Secondary | ICD-10-CM | POA: Diagnosis not present

## 2022-07-08 DIAGNOSIS — R531 Weakness: Secondary | ICD-10-CM | POA: Diagnosis not present

## 2022-07-08 LAB — GLUCOSE, CAPILLARY
Glucose-Capillary: 146 mg/dL — ABNORMAL HIGH (ref 70–99)
Glucose-Capillary: 140 mg/dL — ABNORMAL HIGH (ref 70–99)

## 2022-07-08 LAB — BASIC METABOLIC PANEL
Anion gap: 12 (ref 5–15)
BUN: 73 mg/dL — ABNORMAL HIGH (ref 8–23)
CO2: 25 mmol/L (ref 22–32)
Calcium: 8.4 mg/dL — ABNORMAL LOW (ref 8.9–10.3)
Chloride: 101 mmol/L (ref 98–111)
Creatinine, Ser: 2.13 mg/dL — ABNORMAL HIGH (ref 0.61–1.24)
GFR, Estimated: 30 mL/min — ABNORMAL LOW (ref >60)
Glucose, Bld: 152 mg/dL — ABNORMAL HIGH (ref 70–99)
Potassium: 6 mmol/L — ABNORMAL HIGH (ref 3.5–5.1)
Sodium: 138 mmol/L (ref 135–145)

## 2022-07-08 MED ORDER — GLYCOPYRROLATE 0.2 MG/ML IJ SOLN
0.2000 mg | INTRAMUSCULAR | Status: DC | PRN
  Administered 2022-07-09: 0.2 mg via INTRAVENOUS
  Filled 2022-07-08: qty 1

## 2022-07-08 MED ORDER — POLYVINYL ALCOHOL 1.4 % OP SOLN: 1.0000 [drp] | Freq: Four times a day (QID) | OPHTHALMIC | Status: DC | PRN

## 2022-07-08 MED ORDER — BISACODYL 10 MG RE SUPP: 10.0000 mg | Freq: Every day | RECTAL | Status: DC | PRN

## 2022-07-08 MED ORDER — HYDROMORPHONE HCL 1 MG/ML IJ SOLN
0.3000 mg | INTRAMUSCULAR | Status: DC | PRN
  Administered 2022-07-08 – 2022-07-10 (×5): 0.3 mg via INTRAVENOUS
  Filled 2022-07-08: qty 0.5
  Filled 2022-07-08: qty 1
  Filled 2022-07-08 (×3): qty 0.5

## 2022-07-08 MED ORDER — HALOPERIDOL LACTATE 5 MG/ML IJ SOLN: 1.0000 mg | INTRAMUSCULAR | Status: DC | PRN

## 2022-07-08 MED ORDER — OXYCODONE HCL 5 MG/5ML PO SOLN
5.0000 mg | ORAL | Status: DC | PRN
  Administered 2022-07-08: 5 mg
  Filled 2022-07-08: qty 5

## 2022-07-08 MED ORDER — LORAZEPAM 2 MG/ML IJ SOLN
1.0000 mg | INTRAMUSCULAR | Status: DC | PRN
  Administered 2022-07-09: 1 mg via INTRAVENOUS
  Filled 2022-07-08: qty 1

## 2022-07-08 MED ORDER — BIOTENE DRY MOUTH MT LIQD: 15.0000 mL | OROMUCOSAL | Status: DC | PRN

## 2022-07-08 NOTE — Progress Notes (Signed)
Chaplain followed up with family after the decision to move to comfort care.  For the time being, they want some family time, but are open to chaplains checking in at a later time.  [address], [place] Pager, [phone]

## 2022-07-08 NOTE — Progress Notes (Signed)
[facility] Progress Note   Assessment/ Plan:    AKI: likely multifactorial in the setting of sepsis/ CTA/ low Bps/ pressor requirements.  Vanc level supratherapuetic today at 64  Would be unusual for antibiotic spacer to provide enough systemic absorption to cause AKI             - off midodrine             - dr Hollie Salk had long discussion with Altha Harm (POA) and mulitple family members 1/17-1/18  - CRRT unlikely to restore pt to baseline level of health in setting of functional decline and now critical illness with MSOF- off this AM.  Will not restart  - palliative consulted- appreciate assistance.    - will follow    2.  Acute hypoxic RF             - Extubted 07/07/22.             - on meropenem/ vanc/ azithro   3.  R hip infection             - s/p surg with ortho 06/29/22   4.  Anemia:             - transfuse prn  5.  Change in pupil size  - CT head 1/16 negative  6.  Dispo: palliative consulted, appreciate assistance   Kelly Splinter, MD 07/08/2022, 9:33 AM  Recent Labs  Lab 07/06/22 0457 07/06/22 1650 07/07/22 0515  HGB 10.7*  --  11.7*  ALBUMIN 2.2* 2.4* 2.6*  CALCIUM 7.9*  7.6* 7.9* 7.9*  7.8*  PHOS 2.8 1.7* 3.5  CREATININE 0.91  1.11 0.83 0.80  0.77  K 4.5  4.5 4.5 4.5  4.4    Inpatient medications:  Chlorhexidine Gluconate Cloth  6 each Topical Q0600   cholecalciferol  2,000 Units Oral Daily   docusate  100 mg Per Tube BID   dorzolamide-timolol  1 drop Both Eyes BID   heparin  5,000 Units Subcutaneous Q8H   insulin aspart  0-9 Units Subcutaneous Q4H   labetalol  100 mg Per Tube BID   methylPREDNISolone (SOLU-MEDROL) injection  40 mg Intravenous Q24H   multivitamin with minerals  1 tablet Oral Daily   mouth rinse  15 mL Mouth Rinse 4 times per day   pantoprazole (PROTONIX) IV  40 mg Intravenous Daily   polyethylene glycol  17 g Per Tube Daily   sodium bicarbonate  650 mg Per Tube TID   sodium chloride flush  10-40 mL Intracatheter Q12H    zinc sulfate  220 mg Oral Daily    sodium chloride     sodium chloride Stopped (07/02/22 1245)   sodium chloride Stopped (07/07/22 0559)   feeding supplement (VITAL 1.5 CAL) 55 mL/hr at 07/08/22 0838   linezolid (ZYVOX) IV     meropenem (MERREM) IV Stopped (07/08/22 0836)   Place/Maintain arterial line **AND** sodium chloride, sodium chloride, sodium chloride, acetaminophen **OR** acetaminophen, albuterol, ipratropium-albuterol, lip balm, liver oil-zinc oxide, mouth rinse, oxyCODONE, sodium chloride flush   Subjective:    Seen in room. No /co's today   Objective:   BP (!) 157/48 Comment: art line  Pulse 71   Temp 98 F (36.7 C) (Axillary)   Resp 18   Ht 5\' 8"  (1.727 m)   Wt 62.2 kg   SpO2 99%   BMI 20.85 kg/m   Intake/Output Summary (Last 24 hours) at 07/08/2022 0932 Last data filed at 07/08/2022 9024 Gross  per 24 hour  Intake 1653.2 ml  Output 117 ml  Net 1536.2 ml    Weight change:   Physical Exam: GEN ill-appearing, in bed, acutely and chronically ill appearing PULM off scattered rhonchi CV RRR ABD soft EXT 2+ anasarca NEURO alert and responsive SKIN multiple weeping areas, improving ACCESS: nontunneled HD cath (left)

## 2022-07-08 NOTE — Progress Notes (Signed)
Daily Progress Note   Patient Name: Travis Wolfe       Date: 07/08/2022 DOB: March 17, 1935  Age: 88 y.o. MRN#: [id number] Attending Physician: Candee Furbish, MD Primary Care Physician: Derrill Center., MD Admit Date: 06/25/2022 Length of Stay: 13 days  Reason for Consultation/Follow-up: Establishing goals of care  Subjective:   CC: Met with patient with family at bedside. Following up regarding complex medical decision making.   Subjective:   Discussed care with ICU team prior to presenting to bedside.  ------------------------------------------------------------------------------------------------------------- Advance Care Planning Conversation  Pertinent diagnosis: atrial fibrillation on eliquis, complex surgical history of the right hip with recent arthroplasty resection with placement of antibiotic spacer and started on daptomycin and ceftriaxone , acute renal failure, hyperkalemia, skin breakdown/ulcers  The patient and/or family consented to a voluntary [place]. Individuals present for conversation: Though  tried to involve patient in conversation he was doing attentive and noted to be delirious.  Discussed care extensively with wife and son at bedside.  This palliative provider was present for entirety of conversation.  Summary of the conversation:  Presented to bedside to check on patient.  Patient now extubated lying in bed sleeping.  Spoke with patient's wife and son who are at bedside.  To describe that ICU doctor had come in and discussed with them continuing dialysis as patient's function worsening.  Labs show potassium is up to 6.  We tried to awaken patient so he could participate in this conversation with [place] though he was very inattentive and just wanted to be left alone to rest.  Patient is now delirious; he reports he was very interactive this morning. Despite multiple attempts by son and wife, patient's inattention, lack of ability to  participate in conversation, and lack of ability to participate and complex decisions, led to discussion at bedside with wife and son about steps moving forward.   Time discussing pathways moving forward.  Again discussed either continuing with aggressive medical management which patient had previously stated he did not want versus transitioning to comfort focused care.  With patient's multisystem organ failure, focusing on patient's comfort is appropriate medical care at this time.  Wife and son agreeing that patient would want to be comfortable at the end of his life knowing he is dying.  Explained transition to comfort care.  Will discontinue interventions that are no longer beneficial to patient's overall comfort such as IV fluids, tube feeds,  lab draws, and imaging.  Will instead provide medications for pain, dyspnea, agitation, nausea, and vomiting.  Agreeing with this plan.  Discussed supporting comfort care moving forward.  Discussed patient will be transition to comfort focused care at this time and based on his actively worsening kidney function with associated dyspnea and agitation, patient may need inpatient hospice placement.  We did discuss possibility of patient getting home with hospice though wife would prefer to seek inpatient placement at this time.  She just wants to focus on wherever he can be most comfortable.  Wife would prefer inpatient hosp[facility] placement in [facility] facility.  Noted would reach out to social worker regarding this.  Outcome of the conversations and/or documents completed:  Transition to comfort focused care at this time.  TOC referral placed for inpatient hospice evaluation in [facility].  I spent 28 minutes providing separately identifiable ACP services with the patient and/or surrogate decision maker in a voluntary, in-person conversation discussing the patient's wishes and goals as detailed in the above note.  Chelsea Aus, DO Palliative Care Provider   -------------------------------------------------------------------------------------------------------------  All questions answered at that time.  Provided emotional support via active listening.  Also reach out to chaplain to stop by to provide emotional support.  Thanked family for allowing me to meet with them today.  Updated care team regarding transition to comfort focused care.  Objective:   Vital Signs:  BP (!) 157/48 Comment: art line  Pulse 71   Temp 98 F (36.7 C) (Axillary)   Resp 18   Ht 5\' 8"  (1.727 m)   Wt 62.2 kg   SpO2 99%   BMI 20.85 kg/m   Physical Exam: General: frail, laying intubated in bed, chronically ill appearing, lethargic Eyes: no drainage noted HENT: NG tube in place Cardiovascular: RRR, dependent edema in LE/ bl Respiratory: intubated on ventilator support Abdomen: not distended Extremities: muscle wasting present in all extremities  Skin: Multiple ecchymoses on upper extremities bilaterally, multiple bandages in place for skin tears, multiple deep tissue injuries and sacral wound present (reviewed EMR/discussed with RN) Neuro: Lethargic, inattentive, not able to participate in complex medical decision-making.  Imaging:  I personally reviewed recent imaging.   Assessment & Plan:   Assessment: 88 y.o. male  with past medical history of atrial fibrillation on eliquis, hypertension, mitral regurgitation, and complex surgical history of the right hip with recent arthroplasty resection with placement of antibiotic spacer and started on daptomycin and ceftriaxone 06/07/22 admitted on 06/25/2022 with weakness and anemia. Treated for pneumonia. 1/12 required transfer to ICU for code stroke - imaging negative.  1/13 had hip procedure and remained intubated afterward. Patient with AKI and meets criteria for CRRT. PMT consulted to discuss [place].    Recommendations/Plan: # Complex medical decision making/goals of care:  - Patient unable to participate in  complex medical decision-making due to his current medical status.  -Extensive conversation with wife at bedside as well as patient's son as described above in HPI.  Patient will be transition to comfort focused care at this time. Will discontinue interventions that are no longer beneficial to patient's overall comfort such as IV fluids, tube feeds,  lab draws, and imaging.  Will instead provide medications for pain, dyspnea, agitation, nausea, and vomiting.  Allow for comfort feeds.    -TOC consult to assist with inpatient hospice referral to facility in [facility].   -  Code Status: DNR  # Symptom management:    -Pain/Dyspnea, acute in the setting of end-of-life care                Patient was not on medications for pain previously.                               -Started IV Dilaudid 0.3 mg IV every 30 mins as needed.  Continue to adjust based on patient's symptom burden.  If patient needing frequent dosing, may need to consider continuous infusion.                  -Anxiety/agitation, in the setting of end-of-life care                               -Started IV Ativan 1 mg every 4 hours as needed. Continue to adjust based on patient's symptom burden.                                 -  And also has IV Haldol 1 mg every 4 hours as needed. Continue to adjust based on patient's symptom burden.                   -Secretions, in the setting of end-of-life care                               -Started IV glycopyrrolate 0.2 mg every 4 hours as needed.  # Psychosocial Support:  -wife, son  -asked chaplain to come to bedside to support wife at her request   # Discharge Planning: Transitioned to comfort focused care today. TOC referral placed for inpatient hospice evaluation in [facility].   Discussed with: wife, son, PCCM, bedside RN  Thank you for allowing the palliative care team to participate in the care Travis Wolfe.  Alvester Morin, DO Palliative Care Provider PMT # [phone]  If patient  remains symptomatic despite maximum doses, please call PMT at [phone] between 0700 and 1900. Outside of these hours, please call attending, as PMT does not have night coverage.

## 2022-07-08 NOTE — TOC Progression Note (Signed)
Transition of Care [facility]) - Progression Note    Patient Details  Name: Travis Wolfe MRN: [id number] Date of Birth: May 20, 1935  Transition of Care [facility]) CM/SW [place], LCSW Phone Number: 07/08/2022, 11:10 AM  Clinical Narrative:     CSW spoke with pt's wife Travis Wolfe to offer choice for hospice agency. Pt's wife chose [facility]. CSW spoke with [facility] [place] , she will review pt for residential hospice. TOC to follow.    Expected Discharge Plan: [place] Barriers to Discharge: Continued Medical Work up  Expected Discharge Plan and Services   Discharge Planning Services: CM Consult   Living arrangements for the past 2 months: [place]                                       Social Determinants of Health (SDOH) Interventions SDOH Screenings   Food Insecurity: No Food Insecurity (06/26/2022)  Housing: Low Risk  (06/26/2022)  Transportation Needs: No Transportation Needs (06/26/2022)  Utilities: Not At Risk (06/26/2022)  Depression (PHQ2-9): Low Risk  (04/11/2019)  Tobacco Use: Low Risk  (07/02/2022)    Readmission Risk Interventions     No data to display

## 2022-07-08 NOTE — Progress Notes (Signed)
Patient was settled in room. Did not take vitals per spouse.

## 2022-07-08 NOTE — Progress Notes (Signed)
NAME:  Travis Wolfe, MRN:  [id number], DOB:  1934-10-24, LOS: 70 ADMISSION DATE:  06/25/2022, CONSULTATION DATE:  06/28/22 REFERRING MD:  Flora Lipps, MD CHIEF COMPLAINT:  respiratory failure   History of Present Illness:  Travis Wolfe is an 88 year old male with history of atrial fibrillation on eliquis, hypertension and complex surgical history of the right hip with recent arthroplasty resection with placement of antibiotic spacer and started on daptomycin and ceftriaxone 06/07/22, who was admitted 06/25/22 from SNF with weakness and drop in hemoglobin. He was started on antibiotics for multifocal pneumonia based on chest radiograph on admission.  06/28/21 code stroke was called due to altered mentation and inability to move his upper extremities along with facial asymmetry. CTA head was negative for stroke.   He was more alert and responsive at time of evaluation in the ICU. His wife was at the bedside.   Orthopedics was planning return to OR on 1/13 but has post-poned that procedure at this time.   Pertinent  Medical History   Atrial Fibrillation Hypertension Mitral regurgitation  Significant Hospital Events: Including procedures, antibiotic start and stop dates in addition to other pertinent events   1/9 admitted to [facility] for weakness and anemia. Concern for pneumonia.  1/12 transferred to ICU for code stroke and increasing O2 needs 1/13 remained intubated after ortho hip proceudre, bronchoscopy and PICC line place. 1/14 a-line place, weaned off vasopressors  1/15 remains intubated, poor mental status, family meeting held 1/17 started CRRT, weaned on PS 5/5 1/21 CRRT stopped, extubated  Interim History / Subjective:  Awake, asking for oxycodone for chronic L hip pain.  R hip feels okay.  Has foley, CVL, rectal tube, A line, NGT.  Wife at bedside.  Made 100 urine  Objective   Blood pressure (!) 157/48, pulse 70, temperature 98 F (36.7 C), temperature source Axillary,  resp. rate (!) 22, height 5\' 8"  (1.727 m), weight 62.2 kg, SpO2 100 %.    Vent Mode: PSV FiO2 (%):  [30 %] 30 % PEEP:  [5 cmH20] 5 cmH20 Pressure Support:  [5 cmH20] 5 cmH20   Intake/Output Summary (Last 24 hours) at 07/08/2022 0735 Last data filed at 07/08/2022 4431 Gross per 24 hour  Intake 1513.78 ml  Output 117 ml  Net 1396.78 ml    Filed Weights   07/03/22 0448 07/04/22 0500 07/07/22 0352  Weight: 77.7 kg 75.4 kg 62.2 kg    Examination: No distress, frail Follows commands Weak Scattered rhonci Heart sounds regular Drain R hip Rectal tube brown output Foley minimal amber output ++ anasarca  Labs pending per wife request  Resolved Hospital Problem list   Anion Gap Metabolic Acidosis Lactic Acidosis Hyperkalemia Shock  Assessment & Plan:  A hypoxic resp failure- multifocal infectious vs. inflammatory PNA (?dapto) on steroids, s/p course of abx.  Off vent 1/21, DNR. S/P R hip arthroplasty resection w/ spacer in place, R hip seroma Multiple pressure injuries POA Acute metabolic encephalopathy improved Multifocal acute renal failure- s/p trial of CRRT ended 07/07/21 NSTEMI, Pulm HTN, pAF, HTN Anemia- intermittent during stay, question earlier of GIB revoled Severe protein calorie malnutrition POA Complex family dynamics  Lines: PICC, HD catheter, foley, rectal tube, R hip drain, arterial line  Hoping palliative can help navigate complex family dynamics.    Will check Cr  Hopefully given improved mental status he and family can have EOL discussion.  Will touch base with PMT and try to re-engage later today.  PPI, steroids, merrem,  vanc for now  Pain meds to PRN oxycodone for now  DNR  Best Practice (right click and "Reselect all SmartList Selections" daily)   Diet/type: tubefeeds DVT prophylaxis: prophylactic heparin  GI prophylaxis: PPI Lines: Central line and Arterial Line Foley:  Yes, and it is still needed Code Status:  DNR Last date of  multidisciplinary goals of care discussion [daily, complex situation]   Erskine Emery  MD [facility] See Amion for pager  If no response to pager , please call [phone] until 7pm After 7:00 pm call Elink  [phone] 07/08/2022, 7:35 AM

## 2022-07-08 NOTE — Progress Notes (Signed)
  Daily Progress Note   Patient Name: Travis Wolfe       Date: 07/08/2022 DOB: 04-20-1935  Age: 88 y.o. MRN#: [id number] Attending Physician: Candee Furbish, MD Primary Care Physician: Derrill Center., MD Admit Date: 06/25/2022 Length of Stay: 13 days  Will place full note as soon as able. Met with patient with family at bedside this morning. Patient will now be transitioned to comfort focused care. Will also place [facility] consult to assist with referral to inpatient hospice in [facility]. Updated care team. Thank you.   Chelsea Aus, DO Palliative Care Provider PMT # [phone]

## 2022-07-08 NOTE — IPAL (Signed)
  Interdisciplinary Goals of Care Family Meeting   Date carried out: 07/08/2022  Location of the meeting: Bedside  Member's involved: Physician, Bedside Registered Nurse, and Family Member or next of kin  Durable Power of Attorney or acting medical decision maker: Patient, then Wife (son also there)    Discussion: We discussed goals of care for [employer] .    Told them today while he was lucid to discuss whether he would want dialysis long term.  They will discuss over next day or so.  Code status:   Code Status: DNR   Disposition: Continue current acute care  Time spent for the meeting: 5 mins    Candee Furbish, MD  07/08/2022, 8:43 AM

## 2022-07-09 ENCOUNTER — Ambulatory Visit: Payer: Medicare PPO | Admitting: Internal Medicine

## 2022-07-09 DIAGNOSIS — D649 Anemia, unspecified: Secondary | ICD-10-CM | POA: Diagnosis not present

## 2022-07-09 LAB — PNEUMOCYSTIS PCR: Result Pneumocystis PCR: NEGATIVE

## 2022-07-09 LAB — MISC LABCORP TEST (SEND OUT): Labcorp test code: 832599

## 2022-07-09 MED ORDER — OXYCODONE HCL 5 MG PO TABS: 5.0000 mg | ORAL_TABLET | ORAL | 0 refills | Status: DC | PRN

## 2022-07-09 MED ORDER — LORAZEPAM 1 MG PO TABS: 1.0000 mg | ORAL_TABLET | ORAL | 0 refills | Status: DC | PRN

## 2022-07-09 MED ORDER — GLYCOPYRROLATE 1 MG PO TABS: 1.0000 mg | ORAL_TABLET | Freq: Three times a day (TID) | ORAL | 0 refills | Status: DC

## 2022-07-09 NOTE — TOC Progression Note (Addendum)
Transition of Care [facility]) - Progression Note    Patient Details  Name: Travis Wolfe MRN: [id number] Date of Birth: 1934/07/19  [facility] CM/SW Contact  Lennart Pall, LCSW Phone Number: 07/09/2022, 9:33 AM  Clinical Narrative:    Per Webb Silversmith with [place], anticipating a bed may open today or tomorrow at [facility] and will keep team updated.    61 ADDENDUM: Family now requesting change in plan to dc home with Hospice care.  Have alerted Webb Silversmith with [facility] who has spoken with wife to confirm DME needs and home readiness for pt's return.  Planning for dc tomorrow via [facility]AR once able to confirm DME in place.   Expected Discharge Plan: [place] Barriers to Discharge: Continued Medical Work up  Expected Discharge Plan and Services   Discharge Planning Services: CM Consult   Living arrangements for the past 2 months: [place]                                       Social Determinants of Health (SDOH) Interventions SDOH Screenings   Food Insecurity: No Food Insecurity (06/26/2022)  Housing: Low Risk  (06/26/2022)  Transportation Needs: No Transportation Needs (06/26/2022)  Utilities: Not At Risk (06/26/2022)  Depression (PHQ2-9): Low Risk  (04/11/2019)  Tobacco Use: Low Risk  (07/02/2022)    Readmission Risk Interventions     No data to display

## 2022-07-09 NOTE — Progress Notes (Signed)
Chaplain followed up with Travis Wolfe and his son who was at bedside.  Travis Wolfe's wife, Travis Wolfe, had gone home to prepare their home for Hospice care.  Travis Wolfe was restless but did not seem in pain or distressed.  His son will need to return home for a day or two for some appointments, but he will return as will other family members.  When Travis Wolfe was more alert, chaplain assessed for spiritual and emotional needs, but he shook his head.  Please page if needs arise or if family requests.  [address] [place] Pager, [phone]

## 2022-07-09 NOTE — Progress Notes (Signed)
Daily Progress Note   Patient Name: Travis Wolfe       Date: 07/09/2022 DOB: [birth year]  Age: 88 y.o. MRN#: [id number] Attending Physician: Nita Sells, MD Primary Care Physician: Derrill Center., MD Admit Date: 06/25/2022 Length of Stay: 14 days  Reason for Consultation/Follow-up: Establishing goals of care  Subjective:   CC: Met with patient with son at bedside. Following up regarding complex medical decision making.   Subjective:  Resting in bed, mild restlessness evident, off of O2 [place], chaplain at bedside.    Pertinent diagnosis: atrial fibrillation on eliquis, complex surgical history of the right hip with recent arthroplasty resection with placement of antibiotic spacer and started on daptomycin and ceftriaxone , acute renal failure, hyperkalemia, skin breakdown/ulcers appreciate  chaplain   stopping by to provide emotional support.     continue comfort focused care.  Objective:   Vital Signs:  BP (!) 144/75 (BP Location: Right Arm)   Pulse 61   Temp 97.8 F (36.6 C) (Oral)   Resp 18   Ht 5\' 8"  (1.727 m)   Wt 62.2 kg   SpO2 98%   BMI 20.85 kg/m   Physical Exam: General: frail, laying   in bed, chronically ill appearing, lethargic Eyes: no drainage noted HENT: NG tube in place Cardiovascular: RRR, dependent edema in LE/ bl Respiratory: some apneic pauses Abdomen: not distended Extremities: muscle wasting present in all extremities  Skin: Multiple ecchymoses on upper extremities bilaterally, multiple bandages in place for skin tears, multiple deep tissue injuries and sacral wound present (reviewed EMR/discussed with RN) Neuro: Lethargic, inattentive, not able to participate in complex medical decision-making.  Imaging:  I personally reviewed recent imaging.   Assessment & Plan:   Assessment: 88 y.o. male  with past medical history of atrial fibrillation on eliquis, hypertension, mitral regurgitation, and complex surgical history of the right hip  with recent arthroplasty resection with placement of antibiotic spacer and started on daptomycin and ceftriaxone 06/07/22 admitted on 06/25/2022 with weakness and anemia. Treated for pneumonia. 1/12 required transfer to ICU for code stroke - imaging negative.  1/13 had hip procedure and remained intubated afterward. Patient with AKI and meets criteria for CRRT. PMT consulted to discuss [place].    Recommendations/Plan: # Complex medical decision making/goals of care:  -  transitioned to comfort focused care at this time. Off of interventions that are no longer beneficial to patient's overall comfort such as IV fluids, tube feeds,  lab draws, and imaging.  On  medications for pain, dyspnea, agitation, nausea, and vomiting.  Allow for comfort feeds.    -TOC consult to assist with inpatient hospice referral to facility in [facility].   -  Code Status: DNR  # Symptom management:    -Pain/Dyspnea, acute in the setting of end-of-life care                                   IV Dilaudid 0.3 mg IV every 30 mins as needed.  Continue to adjust based on patient's symptom burden.  If patient needing frequent dosing, may need to consider continuous infusion.                  -Anxiety/agitation, in the setting of end-of-life care  IV Ativan 1 mg every 4 hours as needed. Continue to adjust based on patient's symptom burden.                                 -And also has IV Haldol 1 mg every 4 hours as needed. Continue to adjust based on patient's symptom burden.                   -Secretions, in the setting of end-of-life care                               -Started IV glycopyrrolate 0.2 mg every 4 hours as needed.  # Psychosocial Support:  -wife, son  -appreciate chaplain  support    # Discharge Planning: Transitioned to comfort focused care   [facility] referral placed for inpatient hospice evaluation in [facility].   Discussed with: son   Thank you for allowing the palliative care team  to participate in the care Travis Wolfe.  Mod MDM Travis Chance MD Palliative Care Provider PMT # [phone]  If patient remains symptomatic despite maximum doses, please call PMT at [phone] between 0700 and 1900. Outside of these hours, please call attending, as PMT does not have night coverage.

## 2022-07-09 NOTE — Discharge Summary (Signed)
Physician Discharge Summary         Patient ID: Travis Wolfe MRN: [id number] DOB/AGE: 12/21/1934 88 y.o.  Admit date: 06/25/2022 Discharge date: 07-12-22  Discharge Diagnoses:    Active Hospital Problems   Diagnosis Date Noted   Symptomatic anemia 06/25/2022   High risk medication use 07/08/2022   Shortness of breath 07/08/2022   Agitation 07/08/2022   End of life care 07/08/2022   Palliative care encounter 07/07/2022   Weakness 07/07/2022   Goals of care, counseling/discussion 07/07/2022   Need for emotional support 07/07/2022   Counseling and coordination of care 07/07/2022   DNR (do not resuscitate) 07/07/2022   AKI (acute kidney injury) ([place]) 07/03/2022   Pressure injury of skin 07/03/2022   Malnutrition of moderate degree 06/27/2022   Anasarca 06/25/2022   HTN (hypertension) 06/25/2022   HLD (hyperlipidemia) 06/25/2022   Multifocal pneumonia 06/25/2022   Hypocalcemia 06/25/2022   Hematoma of hip, right, sequela 01/31/2022   PAF (paroxysmal atrial fibrillation) ([place]) 12/15/2017    Resolved Hospital Problems  No resolved problems to display.      Discharge summary    Travis Wolfe is an 88 year old male with history of atrial fibrillation on eliquis, hypertension and complex surgical history of the right hip with recent arthroplasty resection with placement of antibiotic spacer and started on daptomycin and ceftriaxone 06/07/22, who was admitted 06/25/22 from SNF with weakness and drop in hemoglobin. He was started on antibiotics for multifocal pneumonia based on chest radiograph on admission.   06/28/21 code stroke was called due to altered mentation and inability to move his upper extremities along with facial asymmetry. CTA head was negative for stroke.    Orthopedics took patient back to the OR for R hip revision and he was left intubated, underwent bronchoscopy and PICC line placement.  His mental status and renal function continued to worsen and he  remained intubated and required CRRT   Other hospital issues included multiple pressure ulcers, atrial fibrillation and NSTEMI and severe protein calorie malnutrition.   Given his multi-organ failure and worsening mental status palliative care was consulted and family came to the decision to transition to comfort-focused care and extubate with the intent of going home with hospice.     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 No longer requiring any interventions not beneficial to patient's overall comfort such as IV fluids, tube feeds, lab draws and imaging per palliative care recommendations - TOC assisting with home with hospice - CODE STATUS DNR  Pain/dyspnea in the acute setting of end-of-life care per palliative team recommendations - 2 mg  PO dilaudid q every 30 minutes as needed  for pain control and dyspnea --PO ativan 1 mg q4 hours PRN anxiety/agitation -PO glycopyrrolate 0.2 mg q  4H PRN for secretions   Physical Exam Vitals:   07/09/22 1213 07-12-22 0627  BP: (!) 144/75 (!) 155/96  Pulse: 61 70  Resp: 18 18  Temp: 97.8 F (36.6 C) 99.7 F (37.6 C)  SpO2: 98% (!) 88%    Gen: lying in bed, ill appearing, CV: RRR, edema  in bilateral upper and lower extremities Resp: 2 L o2, mouth breathing, some apneic pauses Skin: scattered bruising, ( multiple deep tissue injuries and sacral wound present) Neuro: opens eyes to voice otherwise not attentive  Significant Hospital tests/ studies   MRI brain>  No acute or  reversible finding. Brain atrophy, chronic small vessel ischemia, and small remote right parietal cortex infarct.  CT head> No acute intracranial abnormality.  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   Procedures    1/13 Intubation and bronchoscopy, R hip revision  1/17 CVC    Culture data/antimicrobials    Blood cultures 1/12 negative Respiratory culture 1/13 normal flora RVP 1/13 negative  AFB pending Covid and flu negative    Consults   Nephrology Orthopedics Palliative Critical Care     Labs at discharge   Lab Results  Component Value Date   CREATININE 2.13 (H) 07/08/2022   BUN 73 (H) 07/08/2022   NA 138 07/08/2022   K 6.0 (H) 07/08/2022   CL 101 07/08/2022   CO2 25 07/08/2022   Lab Results  Component Value Date   WBC 19.3 (H) 07/07/2022   HGB 11.7 (L) 07/07/2022   HCT 37.8 (L) 07/07/2022   MCV 95.9 07/07/2022   PLT 110 (L) 07/07/2022   Lab Results  Component Value Date   ALT 115 (H) 07/03/2022   AST 68 (H) 07/03/2022   ALKPHOS 74 07/03/2022   BILITOT 1.4 (H) 07/03/2022   Lab Results  Component Value Date   INR 1.7 (H) 07/01/2022   INR 1.7 (H) 06/25/2022   INR 1.03 02/20/2018    Current radiological studies    No results found.  Disposition:    Home with Hospice    Discharge disposition: 51-Hospice/Medical Facility       Discharge Instructions     Diet - low sodium heart healthy   Complete by: As directed    Diet - low sodium heart healthy   Complete by: As directed    Increase activity slowly   Complete by: As directed    Increase activity slowly   Complete by: As directed    No wound care   Complete by: As directed    No wound care   Complete by: As directed        Allergies as of 2022/08/03       Reactions   Tape Dermatitis, Rash   ONLY use Paper tape   Other    Cat dander   Doxycycline Rash   Horse Epithelium Rash        Medication List     STOP taking these medications    apixaban 2.5 MG Tabs  tablet Commonly known as: ELIQUIS   azithromycin 250 MG tablet Commonly known as: ZITHROMAX   Biotin 5 MG Caps   Calcium-Magnesium-Zinc Tabs   cefTRIAXone  IVPB Commonly known as: ROCEPHIN   daptomycin  IVPB Commonly known as: CUBICIN   docusate sodium 100 MG capsule Commonly known as: COLACE   Eszopiclone 3 MG Tabs   furosemide 20 MG tablet Commonly known as: LASIX   loperamide 2 MG tablet Commonly known as: IMODIUM A-D   Lutein 20 MG Tabs   meclizine 12.5 MG tablet Commonly known as: ANTIVERT   oxyCODONE 5 MG immediate release tablet Commonly known as: Oxy IR/ROXICODONE   OXYGEN   polyethylene glycol 17 g packet Commonly known as: MIRALAX / GLYCOLAX   potassium chloride 10 MEQ tablet Commonly known as: KLOR-CON   ranolazine 500 MG  12 hr tablet Commonly known as: RANEXA   SLOW RELEASE IRON PO   tadalafil 5 MG tablet Commonly known as: CIALIS   valACYclovir 1000 MG tablet Commonly known as: VALTREX   Vitamin D 50 MCG (2000 UT) tablet   zinc gluconate 50 MG tablet       TAKE these medications    acetaminophen 325 MG tablet Commonly known as: TYLENOL Take 325 mg by mouth every 4 (four) hours as needed for mild pain. What changed: Another medication with the same name was removed. Continue taking this medication, and follow the directions you see here.   Cosopt PF 2-0.5 % Soln Generic drug: Dorzolamide HCl-Timolol Mal PF Place 1 drop into both eyes in the morning and at bedtime.   glycopyrrolate 1 MG tablet Commonly known as: Robinul Take 1 tablet (1 mg total) by mouth 3 (three) times daily.   HYDROmorphone 2 MG tablet Commonly known as: Dilaudid Take 1 tablet (2 mg total) by mouth every 4 (four) hours as needed for up to 7 days for severe pain (prn use for end of life care).   ipratropium-albuterol 0.5-2.5 (3) MG/3ML Soln Commonly known as: DUONEB Take 3 mLs by nebulization every 4 (four) hours as needed (wheezing).   lidocaine 5  % Commonly known as: Lidoderm Place 1 patch onto the skin daily. Remove & Discard patch within 12 hours or as directed by MD   LORazepam 1 MG tablet Commonly known as: Ativan Take 1 tablet (1 mg total) by mouth every 4 (four) hours as needed for anxiety.   methocarbamol 500 MG tablet Commonly known as: ROBAXIN Take 1 tablet (500 mg total) by mouth every 6 (six) hours as needed for muscle spasms.         Follow-up appointment   Hospice   Discharge Condition:    stable

## 2022-07-09 NOTE — Progress Notes (Signed)
Patient ID: Travis Wolfe, male   DOB: 01-15-1935, 88 y.o.   MRN: [id number]  Sleeping with his wife by his bedside  His condition is know and has been reviewed We went by today to visit His wife did acknowledge that she said he seemed to be comfortable and wasn't complaining of hip pain and he had sat at side of the bed  Please let us know if there is anything we can do to help

## 2022-07-09 NOTE — Progress Notes (Signed)
PROGRESS NOTE   Travis Wolfe  FWY:[id number] DOB: 1934/08/23 DOA: 06/25/2022 PCP: Derrill Center., MD  Brief Narrative:  88 year old white male known A-fib HTN recent arthroplasty with antibiotic spacer 06/07/2022 on antibiotics Readmitted from SNF 06/25/2022 weakness drop in hemoglobin found to have multifocal pneumonia Transferred to the ICU because of code stroke and increasing oxygen needs intubated had a poor mental status postextubation started CRRT and that was stopped on 1/21 and he was extubated Family was interested in comfort care as it was unlikely that he would do well and currently we are waiting on delivery of various DME to discharge home with hospice   Hospital-Problem based course  hypoxic respiratory failure during hospital stay secondary to infection versus inflammation Right hip arthroplasty with spacer Multiple pressure injuries Metabolic encephalopathy Multifocal renal failure NSTEMI pulmonary HTN paroxysmal A-fib  Met with family at the bedside today, wife lets me know that she is decided that she wants to take him home He has some deep sighing respirations and is on oxygen I do think that this is quite appropriate as she has enough support at home and is at home 24/7 Anticipate he will have less than 48 hours to live so hopefully we can get him home before he does succumb to his illness Family had no questions and I told him I would reach out to [facility]    Subjective: Deep sighing respirations mucosas dry he is not really rousable he is on oxygen I cannot elicit any review of system  Objective: Vitals:   07/08/22 0800 07/08/22 0900 07/08/22 2114 07/09/22 1213  BP:   (!) 150/72 (!) 144/75  Pulse: 68 71 61 61  Resp: 17 18 16 18   Temp:   98.1 F (36.7 C) 97.8 F (36.6 C)  TempSrc:   Oral Oral  SpO2: 99% 99% 100% 98%  Weight:      Height:        Intake/Output Summary (Last 24 hours) at 07/09/2022 1545 Last data filed at 07/09/2022 1504 Gross per 24  hour  Intake 438.58 ml  Output 980 ml  Net -541.42 ml   Filed Weights   07/03/22 0448 07/04/22 0500 07/07/22 0352  Weight: 77.7 kg 75.4 kg 62.2 kg    Examination:  EOMI NCAT no focal deficit but unable to arouse quite frail cachectic S1-S2 slightly tachycardic Abdomen soft no rebound No lower extremity edema perfusing well but some areas of bruising  Data Reviewed: personally reviewed   CBC    Component Value Date/Time   WBC 19.3 (H) 07/07/2022 0515   RBC 3.94 (L) 07/07/2022 0515   HGB 11.7 (L) 07/07/2022 0515   HCT 37.8 (L) 07/07/2022 0515   PLT 110 (L) 07/07/2022 0515   MCV 95.9 07/07/2022 0515   MCH 29.7 07/07/2022 0515   MCHC 31.0 07/07/2022 0515   RDW 19.9 (H) 07/07/2022 0515   LYMPHSABS 0.2 (L) 07/04/2022 0411   MONOABS 0.6 07/04/2022 0411   EOSABS 0.0 07/04/2022 0411   BASOSABS 0.0 07/04/2022 0411      Latest Ref Rng & Units 07/08/2022    8:14 AM 07/07/2022    5:15 AM 07/06/2022    4:50 PM  CMP  Glucose 70 - 99 mg/dL 152  146    146  136   BUN 8 - 23 mg/dL 73  23    23  23    Creatinine 0.61 - 1.24 mg/dL 2.13  0.80    0.77  0.83   Sodium 135 -  145 mmol/L 138  138    137  135   Potassium 3.5 - 5.1 mmol/L 6.0  4.5    4.4  4.5   Chloride 98 - 111 mmol/L 101  103    103  103   CO2 22 - 32 mmol/L 25  29    28  27    Calcium 8.9 - 10.3 mg/dL 8.4  7.9    7.8  7.9      Radiology Studies: No results found.   Scheduled Meds:  Chlorhexidine Gluconate Cloth  6 each Topical Q0600   dorzolamide-timolol  1 drop Both Eyes BID   mouth rinse  15 mL Mouth Rinse 4 times per day   Continuous Infusions:   LOS: 14 days   Time spent: Archie, MD [facility] To contact the attending provider between 7A-7P or the covering provider during after hours 7P-7A, please log into the web site [email or web address] and access using universal [place] password for that web site. If you do not have the password, please call the hospital  operator.  07/09/2022, 3:45 PM

## 2022-07-09 NOTE — Progress Notes (Signed)
Pt is now transitioned to comfort care.  Will sign off.   Kelly Splinter, MD 07/09/2022, 9:19 AM

## 2022-07-09 NOTE — Progress Notes (Signed)
Chaplain provided follow up support to Travis Wolfe and his son who was at bedside.  Support provided through life review and reflective listening.  Travis Wolfe and his son were both in the service.  Travis Wolfe was incredibly supportive of his son when he was recovering from a TBI.  He wants to be there to support his father. Both have seen significant loss and death before. [name]'s decline was very unexpected as he had come in for a procedure but they are accepting of where they are at now and just want to support Travis Wolfe.  There are additional family members coming in including [name] as well as his sister.  [address] [place] Pager, [phone]

## 2022-07-09 NOTE — Care Management Important Message (Signed)
Important Message  Patient Details Hospice Name: Travis Wolfe MRN: [id number] Date of Birth: 29-May-1935   Medicare Important Message Given:  No     [name] 07/09/2022, 3:44 PM

## 2022-07-10 DIAGNOSIS — D649 Anemia, unspecified: Secondary | ICD-10-CM | POA: Diagnosis not present

## 2022-07-10 LAB — PNEUMOCYSTIS JIROVECI SMEAR BY DFA: Pneumocystis jiroveci Ag: NEGATIVE

## 2022-07-10 LAB — MISC LABCORP TEST (SEND OUT): Labcorp test code: 832599

## 2022-07-10 MED ORDER — GLYCOPYRROLATE 1 MG PO TABS: 1.0000 mg | ORAL_TABLET | Freq: Three times a day (TID) | ORAL | 0 refills | Status: AC

## 2022-07-10 MED ORDER — LORAZEPAM 1 MG PO TABS: 1.0000 mg | ORAL_TABLET | ORAL | 0 refills | Status: DC | PRN

## 2022-07-10 MED ORDER — HYDROMORPHONE HCL 2 MG PO TABS: 2.0000 mg | ORAL_TABLET | ORAL | 0 refills | Status: DC | PRN

## 2022-07-10 MED ORDER — GLYCOPYRROLATE 1 MG PO TABS: 1.0000 mg | ORAL_TABLET | Freq: Three times a day (TID) | ORAL | 0 refills | Status: DC

## 2022-07-10 MED ORDER — LORAZEPAM 1 MG PO TABS: 1.0000 mg | ORAL_TABLET | ORAL | 0 refills | Status: AC | PRN

## 2022-07-10 MED ORDER — HYDROMORPHONE HCL 2 MG PO TABS: 2.0000 mg | ORAL_TABLET | ORAL | 0 refills | Status: AC | PRN

## 2022-07-18 NOTE — TOC Transition Note (Deleted)
Transition of Care [facility]) - CM/SW Discharge Note   Patient Details  Name: Travis Wolfe MRN: [id number] Date of Birth: 09-04-1934  [facility] of Care [facility]) CM/SW Contact:  Lennart Pall, LCSW Phone Number: 07/16/22, 1:09 PM   Clinical Narrative:       Final next level of care: Home w Hospice Care Barriers to Discharge: Barriers Resolved   Patient Goals and CMS Choice CMS Medicare.gov Compare Post Acute Care list provided to:: Patient Choice offered to / list presented to : Patient  Discharge Placement                  Patient to be transferred to facility by: PTAR to transport home Name of family member notified: Dewight Catino (wife) Patient and family notified of of transfer: 2022/07/16  Discharge Plan and Services Additional resources added to the After Visit Summary for     Discharge Planning Services: CM Consult                                 Social Determinants of Health (SDOH) Interventions SDOH Screenings   Food Insecurity: No Food Insecurity (06/26/2022)  Housing: Low Risk  (06/26/2022)  Transportation Needs: No Transportation Needs (06/26/2022)  Utilities: Not At Risk (06/26/2022)  Depression (PHQ2-9): Low Risk  (04/11/2019)  Tobacco Use: Low Risk  (07/02/2022)     Readmission Risk Interventions     No data to display

## 2022-07-18 NOTE — TOC Transition Note (Signed)
Transition of Care [facility]) - CM/SW Discharge Note   Patient Details  Name: Travis Wolfe MRN: [id number] Date of Birth: 1934-09-21  Transition of [facility]) CM/SW Contact:  Lennart Pall, LCSW Phone Number: 2022/07/22, 1:08 PM   Clinical Narrative:     Have spoken with pt's wife who confirms all DME has been delivered to the home.  She is aware/ agreeable with CSW to call for [facility] for transporting pt home with [place] to follow.  PTAR called at 1:05pm.  RN aware.  No further TOC needs.  Final next level of care: Home w Hospice Care Barriers to Discharge: Barriers Resolved   Patient Goals and CMS Choice CMS Medicare.gov Compare Post Acute Care list provided to:: Patient Choice offered to / list presented to : Patient  Discharge Placement                  Patient to be transferred to facility by: PTAR to transport home Name of family member notified: Brogan Martis (wife) Patient and family notified of of transfer: 07/22/2022  Discharge Plan and Services Additional resources added to the After Visit Summary for     Discharge Planning Services: CM Consult                                 Social Determinants of Health (SDOH) Interventions SDOH Screenings   Food Insecurity: No Food Insecurity (06/26/2022)  Housing: Low Risk  (06/26/2022)  Transportation Needs: No Transportation Needs (06/26/2022)  Utilities: Not At Risk (06/26/2022)  Depression (PHQ2-9): Low Risk  (04/11/2019)  Tobacco Use: Low Risk  (07/02/2022)     Readmission Risk Interventions     No data to display

## 2022-07-18 NOTE — Progress Notes (Signed)
Daily Progress Note   Patient Name: Travis Wolfe       Date: Jul 13, 2022 DOB: 1934-08-06  Age: 88 y.o. MRN#: [id number] Attending Physician: Desiree Hane, MD Primary Care Physician: Derrill Center., MD Admit Date: 06/25/2022 Length of Stay: 15 days  Reason for Consultation/Follow-up: Establishing goals of care  Subjective:   CC: Met with patient with son at bedside. Following up regarding complex medical decision making.   Subjective:  Resting in bed, no distress, no family at bedside.     continue comfort focused care.  Objective:   Vital Signs:  BP (!) 155/96 (BP Location: Right Arm)   Pulse 70   Temp 99.7 F (37.6 C) (Oral)   Resp 18   Ht 5\' 8"  (1.727 m)   Wt 62.2 kg   SpO2 (!) 88%   BMI 20.85 kg/m   Physical Exam: General: frail, laying   in bed, chronically ill appearing, lethargic Eyes: no drainage noted HENT: NG tube in place Cardiovascular: RRR, dependent edema in LE/ bl Respiratory: some apneic pauses Abdomen: not distended Extremities: muscle wasting present in all extremities  Skin: Multiple ecchymoses on upper extremities bilaterally, multiple bandages in place for skin tears, multiple deep tissue injuries and sacral wound present (reviewed EMR/discussed with RN) Neuro: Lethargic, inattentive, not able to participate in complex medical decision-making.  Imaging:  I personally reviewed recent imaging.   Assessment & Plan:   Assessment: 88 y.o. male  with past medical history of atrial fibrillation on eliquis, hypertension, mitral regurgitation, and complex surgical history of the right hip with recent arthroplasty resection with placement of antibiotic spacer and started on daptomycin and ceftriaxone 06/07/22 admitted on 06/25/2022 with weakness and anemia. Treated for pneumonia. 1/12 required transfer to ICU for code stroke - imaging negative.  1/13 had hip procedure and remained intubated afterward. Patient with AKI and meets criteria for CRRT. PMT  consulted to discuss [place].    Recommendations/Plan: # Complex medical decision making/goals of care:  -  transitioned to comfort focused care at this time. Off of interventions that are no longer beneficial to patient's overall comfort such as IV fluids, tube feeds,  lab draws, and imaging.  On  medications for pain, dyspnea, agitation, nausea, and vomiting.  Allow for comfort feeds.    -TOC consult to assist with home with hospice.   -  Code Status: DNR  # Symptom management:    -Pain/Dyspnea, acute in the setting of end-of-life care                                   IV Dilaudid 0.3 mg IV every 30 mins as needed. Required 2-3 doses in the past 24 hours.   Continue to adjust based on patient's symptom burden.  If patient needing frequent dosing, may need to consider continuous infusion.                  -Anxiety/agitation, in the setting of end-of-life care                                 IV Ativan 1 mg every 4 hours as needed. Continue to adjust based on patient's symptom burden.                                 -  And also has IV Haldol 1 mg every 4 hours as needed. Continue to adjust based on patient's symptom burden.                   -Secretions, in the setting of end-of-life care                             IV glycopyrrolate 0.2 mg every 4 hours as needed.  # Psychosocial Support:  -wife, son  -appreciate chaplain  support    # Discharge Planning: Transitioned to comfort focused care   [facility] consulted for home with hospice arrangements.    Discussed with: TOC   DNR form signed.  Thank you for allowing the palliative care team to participate in the care [name].  low MDM [name] MD Palliative Care Provider PMT # [phone]  If patient remains symptomatic despite maximum doses, please call PMT at [phone] between 0700 and 1900. Outside of these hours, please call attending, as PMT does not have night coverage.

## 2022-07-18 DEATH — deceased

## 2022-07-27 ENCOUNTER — Encounter (HOSPITAL_COMMUNITY): Payer: Self-pay | Admitting: Orthopedic Surgery

## 2022-08-01 LAB — FUNGUS CULTURE RESULT

## 2022-08-01 LAB — FUNGUS CULTURE WITH STAIN

## 2022-08-01 LAB — FUNGAL ORGANISM REFLEX

## 2022-08-14 LAB — ACID FAST CULTURE WITH REFLEXED SENSITIVITIES (MYCOBACTERIA): Acid Fast Culture: NEGATIVE
